# Patient Record
Sex: Male | Born: 1950 | Race: Black or African American | Hispanic: No | Marital: Single | Smoking: Current some day smoker
Health system: Southern US, Community
[De-identification: ages and names within clinical notes are randomized; demographics above are authoritative.]

## PROBLEM LIST (undated history)

## (undated) DIAGNOSIS — I509 Heart failure, unspecified: Secondary | ICD-10-CM

## (undated) DIAGNOSIS — I10 Essential (primary) hypertension: Secondary | ICD-10-CM

## (undated) DIAGNOSIS — E785 Hyperlipidemia, unspecified: Secondary | ICD-10-CM

## (undated) HISTORY — PX: OTHER SURGICAL HISTORY: SHX169

## (undated) HISTORY — PX: LEG SURGERY: SHX1003

---

## 2021-05-09 ENCOUNTER — Other Ambulatory Visit: Payer: Self-pay | Admitting: Student

## 2021-05-09 DIAGNOSIS — F17210 Nicotine dependence, cigarettes, uncomplicated: Secondary | ICD-10-CM

## 2022-02-10 ENCOUNTER — Other Ambulatory Visit: Payer: Self-pay | Admitting: Student

## 2022-02-10 DIAGNOSIS — F17200 Nicotine dependence, unspecified, uncomplicated: Secondary | ICD-10-CM

## 2022-02-10 DIAGNOSIS — F17210 Nicotine dependence, cigarettes, uncomplicated: Secondary | ICD-10-CM

## 2022-03-05 ENCOUNTER — Inpatient Hospital Stay: Admission: RE | Admit: 2022-03-05 | Payer: Self-pay | Source: Ambulatory Visit

## 2022-03-06 ENCOUNTER — Inpatient Hospital Stay (HOSPITAL_COMMUNITY)
Admission: EM | Admit: 2022-03-06 | Discharge: 2022-03-11 | DRG: 286 | Disposition: A | Discharge status: Home / Self Care | Payer: Medicare Other | Attending: Family Medicine | Admitting: Family Medicine

## 2022-03-06 ENCOUNTER — Encounter (HOSPITAL_COMMUNITY): Payer: Self-pay | Admitting: Internal Medicine

## 2022-03-06 ENCOUNTER — Emergency Department (HOSPITAL_COMMUNITY): Payer: Medicare Other

## 2022-03-06 ENCOUNTER — Other Ambulatory Visit: Payer: Self-pay

## 2022-03-06 ENCOUNTER — Inpatient Hospital Stay (HOSPITAL_COMMUNITY): Payer: Medicare Other

## 2022-03-06 DIAGNOSIS — E876 Hypokalemia: Secondary | ICD-10-CM | POA: Diagnosis present

## 2022-03-06 DIAGNOSIS — J9622 Acute and chronic respiratory failure with hypercapnia: Secondary | ICD-10-CM | POA: Diagnosis present

## 2022-03-06 DIAGNOSIS — J441 Chronic obstructive pulmonary disease with (acute) exacerbation: Secondary | ICD-10-CM | POA: Diagnosis present

## 2022-03-06 DIAGNOSIS — F17210 Nicotine dependence, cigarettes, uncomplicated: Secondary | ICD-10-CM | POA: Diagnosis present

## 2022-03-06 DIAGNOSIS — I10 Essential (primary) hypertension: Secondary | ICD-10-CM | POA: Diagnosis not present

## 2022-03-06 DIAGNOSIS — G9341 Metabolic encephalopathy: Secondary | ICD-10-CM

## 2022-03-06 DIAGNOSIS — J9621 Acute and chronic respiratory failure with hypoxia: Secondary | ICD-10-CM | POA: Diagnosis present

## 2022-03-06 DIAGNOSIS — I5042 Chronic combined systolic (congestive) and diastolic (congestive) heart failure: Secondary | ICD-10-CM

## 2022-03-06 DIAGNOSIS — Z79890 Hormone replacement therapy (postmenopausal): Secondary | ICD-10-CM | POA: Diagnosis not present

## 2022-03-06 DIAGNOSIS — R0603 Acute respiratory distress: Secondary | ICD-10-CM | POA: Diagnosis present

## 2022-03-06 DIAGNOSIS — F17200 Nicotine dependence, unspecified, uncomplicated: Secondary | ICD-10-CM

## 2022-03-06 DIAGNOSIS — I5043 Acute on chronic combined systolic (congestive) and diastolic (congestive) heart failure: Secondary | ICD-10-CM | POA: Diagnosis present

## 2022-03-06 DIAGNOSIS — I509 Heart failure, unspecified: Secondary | ICD-10-CM

## 2022-03-06 DIAGNOSIS — I130 Hypertensive heart and chronic kidney disease with heart failure and stage 1 through stage 4 chronic kidney disease, or unspecified chronic kidney disease: Secondary | ICD-10-CM | POA: Diagnosis present

## 2022-03-06 DIAGNOSIS — Z20822 Contact with and (suspected) exposure to covid-19: Secondary | ICD-10-CM | POA: Diagnosis present

## 2022-03-06 DIAGNOSIS — I4510 Unspecified right bundle-branch block: Secondary | ICD-10-CM | POA: Diagnosis present

## 2022-03-06 DIAGNOSIS — J9601 Acute respiratory failure with hypoxia: Secondary | ICD-10-CM

## 2022-03-06 DIAGNOSIS — N189 Chronic kidney disease, unspecified: Secondary | ICD-10-CM

## 2022-03-06 DIAGNOSIS — J9602 Acute respiratory failure with hypercapnia: Secondary | ICD-10-CM | POA: Diagnosis not present

## 2022-03-06 DIAGNOSIS — I160 Hypertensive urgency: Secondary | ICD-10-CM | POA: Diagnosis present

## 2022-03-06 DIAGNOSIS — Z720 Tobacco use: Secondary | ICD-10-CM

## 2022-03-06 DIAGNOSIS — I5021 Acute systolic (congestive) heart failure: Secondary | ICD-10-CM

## 2022-03-06 DIAGNOSIS — R590 Localized enlarged lymph nodes: Secondary | ICD-10-CM

## 2022-03-06 DIAGNOSIS — N1831 Chronic kidney disease, stage 3a: Secondary | ICD-10-CM | POA: Diagnosis present

## 2022-03-06 DIAGNOSIS — G9340 Encephalopathy, unspecified: Secondary | ICD-10-CM | POA: Diagnosis not present

## 2022-03-06 DIAGNOSIS — E785 Hyperlipidemia, unspecified: Secondary | ICD-10-CM | POA: Diagnosis present

## 2022-03-06 DIAGNOSIS — E1122 Type 2 diabetes mellitus with diabetic chronic kidney disease: Secondary | ICD-10-CM | POA: Diagnosis present

## 2022-03-06 DIAGNOSIS — I2510 Atherosclerotic heart disease of native coronary artery without angina pectoris: Secondary | ICD-10-CM

## 2022-03-06 DIAGNOSIS — Z79899 Other long term (current) drug therapy: Secondary | ICD-10-CM

## 2022-03-06 HISTORY — DX: Hyperlipidemia, unspecified: E78.5

## 2022-03-06 HISTORY — DX: Acute respiratory failure with hypoxia: J96.01

## 2022-03-06 LAB — ECHOCARDIOGRAM COMPLETE
Area-P 1/2: 4.04 cm2
Calc EF: 30.4 %
Height: 65 in
S' Lateral: 4.8 cm
Single Plane A2C EF: 28.9 %
Single Plane A4C EF: 37.7 %
Weight: 2496 oz

## 2022-03-06 LAB — BASIC METABOLIC PANEL
Anion gap: 11 (ref 5–15)
Anion gap: 14 (ref 5–15)
BUN: 22 mg/dL (ref 8–23)
BUN: 18 mg/dL (ref 8–23)
CO2: 29 mmol/L (ref 22–32)
CO2: 29 mmol/L (ref 22–32)
Calcium: 9.1 mg/dL (ref 8.9–10.3)
Calcium: 8.7 mg/dL — ABNORMAL LOW (ref 8.9–10.3)
Chloride: 96 mmol/L — ABNORMAL LOW (ref 98–111)
Chloride: 96 mmol/L — ABNORMAL LOW (ref 98–111)
Creatinine, Ser: 1.57 mg/dL — ABNORMAL HIGH (ref 0.61–1.24)
Creatinine, Ser: 1.67 mg/dL — ABNORMAL HIGH (ref 0.61–1.24)
GFR, Estimated: 47 mL/min — ABNORMAL LOW (ref >60)
GFR, Estimated: 43 mL/min — ABNORMAL LOW (ref >60)
Glucose, Bld: 150 mg/dL — ABNORMAL HIGH (ref 70–99)
Glucose, Bld: 231 mg/dL — ABNORMAL HIGH (ref 70–99)
Potassium: 3.9 mmol/L (ref 3.5–5.1)
Potassium: 3.7 mmol/L (ref 3.5–5.1)
Sodium: 136 mmol/L (ref 135–145)
Sodium: 139 mmol/L (ref 135–145)

## 2022-03-06 LAB — I-STAT VENOUS BLOOD GAS, ED
Acid-Base Excess: 2 mmol/L (ref 0.0–2.0)
Acid-Base Excess: 4 mmol/L — ABNORMAL HIGH (ref 0.0–2.0)
Bicarbonate: 33.3 mmol/L — ABNORMAL HIGH (ref 20.0–28.0)
Bicarbonate: 33.7 mmol/L — ABNORMAL HIGH (ref 20.0–28.0)
Calcium, Ion: 1.1 mmol/L — ABNORMAL LOW (ref 1.15–1.40)
Calcium, Ion: 1.06 mmol/L — ABNORMAL LOW (ref 1.15–1.40)
HCT: 60 % — ABNORMAL HIGH (ref 39.0–52.0)
HCT: 56 % — ABNORMAL HIGH (ref 39.0–52.0)
Hemoglobin: 20.4 g/dL — ABNORMAL HIGH (ref 13.0–17.0)
Hemoglobin: 19 g/dL — ABNORMAL HIGH (ref 13.0–17.0)
O2 Saturation: 94 %
O2 Saturation: 68 %
Potassium: 3.6 mmol/L (ref 3.5–5.1)
Potassium: 4.2 mmol/L (ref 3.5–5.1)
Sodium: 135 mmol/L (ref 135–145)
Sodium: 135 mmol/L (ref 135–145)
TCO2: 35 mmol/L — ABNORMAL HIGH (ref 22–32)
TCO2: 36 mmol/L — ABNORMAL HIGH (ref 22–32)
pCO2, Ven: 73.7 mmHg (ref 44–60)
pCO2, Ven: 70.4 mmHg (ref 44–60)
pH, Ven: 7.263 (ref 7.25–7.43)
pH, Ven: 7.289 (ref 7.25–7.43)
pO2, Ven: 87 mmHg — ABNORMAL HIGH (ref 32–45)
pO2, Ven: 41 mmHg (ref 32–45)

## 2022-03-06 LAB — RESPIRATORY PANEL BY PCR

## 2022-03-06 LAB — URINALYSIS, ROUTINE W REFLEX MICROSCOPIC
Bacteria, UA: NONE SEEN
Bilirubin Urine: NEGATIVE
Glucose, UA: NEGATIVE mg/dL
Ketones, ur: NEGATIVE mg/dL
Leukocytes,Ua: NEGATIVE
Nitrite: NEGATIVE
Protein, ur: 100 mg/dL — AB
Specific Gravity, Urine: 1.029 (ref 1.005–1.030)
pH: 5 (ref 5.0–8.0)

## 2022-03-06 LAB — I-STAT ARTERIAL BLOOD GAS, ED
Acid-Base Excess: 0 mmol/L (ref 0.0–2.0)
Bicarbonate: 29 mmol/L — ABNORMAL HIGH (ref 20.0–28.0)
Calcium, Ion: 1.19 mmol/L (ref 1.15–1.40)
HCT: 55 % — ABNORMAL HIGH (ref 39.0–52.0)
Hemoglobin: 18.7 g/dL — ABNORMAL HIGH (ref 13.0–17.0)
O2 Saturation: 91 %
Patient temperature: 98
Potassium: 3.4 mmol/L — ABNORMAL LOW (ref 3.5–5.1)
Sodium: 136 mmol/L (ref 135–145)
TCO2: 31 mmol/L (ref 22–32)
pCO2 arterial: 59.2 mmHg — ABNORMAL HIGH (ref 32–48)
pH, Arterial: 7.297 — ABNORMAL LOW (ref 7.35–7.45)
pO2, Arterial: 68 mmHg — ABNORMAL LOW (ref 83–108)

## 2022-03-06 LAB — CBC WITH DIFFERENTIAL/PLATELET
Abs Immature Granulocytes: 0.04 10*3/uL (ref 0.00–0.07)
Basophils Absolute: 0.1 10*3/uL (ref 0.0–0.1)
Basophils Relative: 1 %
Eosinophils Absolute: 0.7 10*3/uL — ABNORMAL HIGH (ref 0.0–0.5)
Eosinophils Relative: 9 %
HCT: 57.4 % — ABNORMAL HIGH (ref 39.0–52.0)
Hemoglobin: 18.6 g/dL — ABNORMAL HIGH (ref 13.0–17.0)
Immature Granulocytes: 1 %
Lymphocytes Relative: 28 %
Lymphs Abs: 2.2 10*3/uL (ref 0.7–4.0)
MCH: 30 pg (ref 26.0–34.0)
MCHC: 32.4 g/dL (ref 30.0–36.0)
MCV: 92.4 fL (ref 80.0–100.0)
Monocytes Absolute: 0.8 10*3/uL (ref 0.1–1.0)
Monocytes Relative: 10 %
Neutro Abs: 4.1 10*3/uL (ref 1.7–7.7)
Neutrophils Relative %: 51 %
Platelets: 179 10*3/uL (ref 150–400)
RBC: 6.21 MIL/uL — ABNORMAL HIGH (ref 4.22–5.81)
RDW: 14.8 % (ref 11.5–15.5)
WBC: 8 10*3/uL (ref 4.0–10.5)
nRBC: 0 % (ref 0.0–0.2)

## 2022-03-06 LAB — RESP PANEL BY RT-PCR (FLU A&B, COVID) ARPGX2
Influenza A by PCR: NEGATIVE
Influenza B by PCR: NEGATIVE
SARS Coronavirus 2 by RT PCR: NEGATIVE

## 2022-03-06 LAB — PROCALCITONIN: Procalcitonin: <0.1 ng/mL

## 2022-03-06 LAB — MRSA NEXT GEN BY PCR, NASAL: MRSA by PCR Next Gen: NOT DETECTED

## 2022-03-06 LAB — TROPONIN I (HIGH SENSITIVITY)
Troponin I (High Sensitivity): 53 ng/L — ABNORMAL HIGH (ref <18)
Troponin I (High Sensitivity): 46 ng/L — ABNORMAL HIGH (ref <18)
Troponin I (High Sensitivity): 45 ng/L — ABNORMAL HIGH (ref <18)
Troponin I (High Sensitivity): 46 ng/L — ABNORMAL HIGH (ref <18)

## 2022-03-06 LAB — GLUCOSE, CAPILLARY
Glucose-Capillary: 190 mg/dL — ABNORMAL HIGH (ref 70–99)
Glucose-Capillary: 214 mg/dL — ABNORMAL HIGH (ref 70–99)
Glucose-Capillary: 198 mg/dL — ABNORMAL HIGH (ref 70–99)

## 2022-03-06 LAB — PHOSPHORUS: Phosphorus: 3.7 mg/dL (ref 2.5–4.6)

## 2022-03-06 LAB — BRAIN NATRIURETIC PEPTIDE: B Natriuretic Peptide: 1211.4 pg/mL — ABNORMAL HIGH (ref 0.0–100.0)

## 2022-03-06 LAB — MAGNESIUM: Magnesium: 2.5 mg/dL — ABNORMAL HIGH (ref 1.7–2.4)

## 2022-03-06 MED ORDER — IOHEXOL 350 MG/ML SOLN
75.0000 mL | Freq: Once | INTRAVENOUS | Status: AC | PRN
  Administered 2022-03-06: 75 mL via INTRAVENOUS

## 2022-03-06 MED ORDER — SODIUM CHLORIDE 0.9 % IV SOLN: INTRAVENOUS | Status: DC | PRN

## 2022-03-06 MED ORDER — MAGNESIUM SULFATE 2 GM/50ML IV SOLN
2.0000 g | Freq: Once | INTRAVENOUS | Status: AC
  Administered 2022-03-06: 2 g via INTRAVENOUS
  Filled 2022-03-06: qty 50

## 2022-03-06 MED ORDER — NITROGLYCERIN 2 % TD OINT
1.0000 [in_us] | TOPICAL_OINTMENT | Freq: Four times a day (QID) | TRANSDERMAL | Status: DC
  Administered 2022-03-06 – 2022-03-07 (×2): 1 [in_us] via TOPICAL
  Filled 2022-03-06 (×2): qty 30

## 2022-03-06 MED ORDER — METHYLPREDNISOLONE SODIUM SUCC 40 MG IJ SOLR
40.0000 mg | Freq: Two times a day (BID) | INTRAMUSCULAR | Status: DC
  Administered 2022-03-06: 40 mg via INTRAVENOUS
  Filled 2022-03-06 (×2): qty 1

## 2022-03-06 MED ORDER — CHLORHEXIDINE GLUCONATE CLOTH 2 % EX PADS
6.0000 | MEDICATED_PAD | Freq: Every day | CUTANEOUS | Status: DC
  Administered 2022-03-06 – 2022-03-07 (×2): 6 via TOPICAL

## 2022-03-06 MED ORDER — DOCUSATE SODIUM 100 MG PO CAPS: 100.0000 mg | ORAL_CAPSULE | Freq: Two times a day (BID) | ORAL | Status: DC | PRN

## 2022-03-06 MED ORDER — NITROGLYCERIN 2 % TD OINT
1.0000 [in_us] | TOPICAL_OINTMENT | Freq: Once | TRANSDERMAL | Status: AC
  Administered 2022-03-06: 1 [in_us] via TOPICAL
  Filled 2022-03-06: qty 1

## 2022-03-06 MED ORDER — ACETAZOLAMIDE SODIUM 500 MG IJ SOLR
500.0000 mg | Freq: Once | INTRAMUSCULAR | Status: AC
  Administered 2022-03-06: 500 mg via INTRAVENOUS
  Filled 2022-03-06: qty 500

## 2022-03-06 MED ORDER — IPRATROPIUM BROMIDE 0.02 % IN SOLN
0.5000 mg | Freq: Once | RESPIRATORY_TRACT | Status: AC
Start: 2022-03-06 — End: 2022-03-06
  Administered 2022-03-06: 0.5 mg via RESPIRATORY_TRACT
  Filled 2022-03-06: qty 2.5

## 2022-03-06 MED ORDER — ASPIRIN 81 MG PO CHEW
81.0000 mg | CHEWABLE_TABLET | Freq: Every day | ORAL | Status: DC
  Administered 2022-03-07 – 2022-03-11 (×5): 81 mg via ORAL
  Filled 2022-03-06 (×5): qty 1

## 2022-03-06 MED ORDER — ASPIRIN 325 MG PO TABS
325.0000 mg | ORAL_TABLET | Freq: Once | ORAL | Status: AC
  Administered 2022-03-06: 325 mg via ORAL
  Filled 2022-03-06: qty 1

## 2022-03-06 MED ORDER — POLYETHYLENE GLYCOL 3350 17 G PO PACK
17.0000 g | PACK | Freq: Every day | ORAL | Status: DC | PRN
  Administered 2022-03-08: 17 g via ORAL
  Filled 2022-03-06: qty 1

## 2022-03-06 MED ORDER — CHLORHEXIDINE GLUCONATE 0.12 % MT SOLN
15.0000 mL | Freq: Two times a day (BID) | OROMUCOSAL | Status: DC
  Administered 2022-03-06 – 2022-03-11 (×8): 15 mL via OROMUCOSAL
  Filled 2022-03-06 (×7): qty 15

## 2022-03-06 MED ORDER — HEPARIN (PORCINE) 25000 UT/250ML-% IV SOLN
950.0000 [IU]/h | INTRAVENOUS | Status: AC
  Administered 2022-03-06: 950 [IU]/h via INTRAVENOUS
  Filled 2022-03-06 (×2): qty 250

## 2022-03-06 MED ORDER — NITROGLYCERIN 0.4 MG SL SUBL
0.4000 mg | SUBLINGUAL_TABLET | SUBLINGUAL | Status: DC | PRN
  Administered 2022-03-06: 0.4 mg via SUBLINGUAL
  Filled 2022-03-06: qty 1

## 2022-03-06 MED ORDER — METHYLPREDNISOLONE SODIUM SUCC 125 MG IJ SOLR
125.0000 mg | Freq: Once | INTRAMUSCULAR | Status: AC
  Administered 2022-03-06: 125 mg via INTRAVENOUS
  Filled 2022-03-06: qty 2

## 2022-03-06 MED ORDER — ORAL CARE MOUTH RINSE
15.0000 mL | Freq: Two times a day (BID) | OROMUCOSAL | Status: DC
  Administered 2022-03-06 – 2022-03-08 (×3): 15 mL via OROMUCOSAL

## 2022-03-06 MED ORDER — ALBUTEROL SULFATE (2.5 MG/3ML) 0.083% IN NEBU
5.0000 mg | INHALATION_SOLUTION | Freq: Once | RESPIRATORY_TRACT | Status: AC
  Administered 2022-03-06: 5 mg via RESPIRATORY_TRACT

## 2022-03-06 MED ORDER — HEPARIN SODIUM (PORCINE) 5000 UNIT/ML IJ SOLN
5000.0000 [IU] | Freq: Three times a day (TID) | INTRAMUSCULAR | Status: DC
  Administered 2022-03-06 (×2): 5000 [IU] via SUBCUTANEOUS
  Filled 2022-03-06 (×2): qty 1

## 2022-03-06 MED ORDER — HYDRALAZINE HCL 20 MG/ML IJ SOLN
10.0000 mg | INTRAMUSCULAR | Status: DC | PRN
  Administered 2022-03-06: 10 mg via INTRAVENOUS
  Filled 2022-03-06: qty 1

## 2022-03-06 MED ORDER — ALBUTEROL SULFATE (2.5 MG/3ML) 0.083% IN NEBU
5.0000 mg | INHALATION_SOLUTION | Freq: Once | RESPIRATORY_TRACT | Status: AC
  Administered 2022-03-06: 5 mg via RESPIRATORY_TRACT
  Filled 2022-03-06: qty 6

## 2022-03-06 MED ORDER — ARFORMOTEROL TARTRATE 15 MCG/2ML IN NEBU
15.0000 ug | INHALATION_SOLUTION | Freq: Two times a day (BID) | RESPIRATORY_TRACT | Status: DC
  Administered 2022-03-06 – 2022-03-09 (×6): 15 ug via RESPIRATORY_TRACT
  Filled 2022-03-06 (×7): qty 2

## 2022-03-06 MED ORDER — PANTOPRAZOLE SODIUM 40 MG IV SOLR
40.0000 mg | Freq: Every day | INTRAVENOUS | Status: DC
  Administered 2022-03-06 – 2022-03-08 (×3): 40 mg via INTRAVENOUS
  Filled 2022-03-06 (×3): qty 10

## 2022-03-06 MED ORDER — ALBUTEROL (5 MG/ML) CONTINUOUS INHALATION SOLN: 5.0000 mg/h | INHALATION_SOLUTION | Freq: Once | RESPIRATORY_TRACT | Status: DC

## 2022-03-06 MED ORDER — POTASSIUM CHLORIDE CRYS ER 10 MEQ PO TBCR
30.0000 meq | EXTENDED_RELEASE_TABLET | Freq: Once | ORAL | Status: AC
  Administered 2022-03-07: 30 meq via ORAL
  Filled 2022-03-06: qty 1

## 2022-03-06 MED ORDER — BUDESONIDE 0.5 MG/2ML IN SUSP
0.5000 mg | Freq: Two times a day (BID) | RESPIRATORY_TRACT | Status: DC
  Administered 2022-03-06 – 2022-03-09 (×6): 0.5 mg via RESPIRATORY_TRACT
  Filled 2022-03-06 (×6): qty 2

## 2022-03-06 MED ORDER — ONDANSETRON HCL 4 MG/2ML IJ SOLN: 4.0000 mg | Freq: Four times a day (QID) | INTRAMUSCULAR | Status: DC | PRN

## 2022-03-06 MED ORDER — FUROSEMIDE 10 MG/ML IJ SOLN
40.0000 mg | Freq: Once | INTRAMUSCULAR | Status: AC
  Administered 2022-03-06: 40 mg via INTRAVENOUS
  Filled 2022-03-06: qty 4

## 2022-03-06 MED ORDER — METHYLPREDNISOLONE SODIUM SUCC 125 MG IJ SOLR: 125.0000 mg | Freq: Once | INTRAMUSCULAR | Status: DC

## 2022-03-06 MED ORDER — STERILE WATER FOR INJECTION IJ SOLN
INTRAMUSCULAR | Status: AC
  Filled 2022-03-06: qty 10

## 2022-03-06 NOTE — Progress Notes (Signed)
Pt transported from ED22 to CT and back with no complications.

## 2022-03-06 NOTE — Progress Notes (Signed)
ANTICOAGULATION CONSULT NOTE - Initial Consult  Pharmacy Consult for IV heparin Indication: chest pain/ACS  No Known Allergies  Patient Measurements: Height: 5\' 5"  (165.1 cm) Weight: 70.8 kg (156 lb) IBW/kg (Calculated) : 61.5 Heparin Dosing Weight: 70.8 kg  Vital Signs: Temp: 98.5 F (36.9 C) (05/19 1525) Temp Source: Oral (05/19 1525) BP: 127/77 (05/19 1700) Pulse Rate: 98 (05/19 1700)  Labs: Recent Labs    03/06/22 0503 03/06/22 0543 03/06/22 0737 03/06/22 0739 03/06/22 0832  HGB 18.6* 20.4*  --  19.0* 18.7*  HCT 57.4* 60.0*  --  56.0* 55.0*  PLT 179  --   --   --   --   CREATININE 1.57*  --   --   --   --   TROPONINIHS 53*  --  46*  --   --     Estimated Creatinine Clearance: 37.5 mL/min (A) (by C-G formula based on SCr of 1.57 mg/dL (H)).   Medical History: No past medical history on file.  Medications:  Infusions:   Assessment: 71 yo male admitted with SOB, placed on bipap.  Now with t-wave changes on EKG and pharmacy asked to start anticoagulation with IV heparin.  No anticoagulation noted PTA.  Last sq heparin dose at 1545 pm.  Goal of Therapy:  Heparin level 0.3-0.7 units/ml Monitor platelets by anticoagulation protocol: Yes   Plan:  Start IV heparin gtt at 950 units/hr without bolus since pt recently had 5000 units SQ heparin. Check heparin level 8 hrs after gtt starts. Daily heparin level and CBC. F/u plans for further cardiac workup.  62, Reece Leader, BCCP Clinical Pharmacist  03/06/2022 5:38 PM   [facility] pharmacy phone numbers are listed on [email or web address]

## 2022-03-06 NOTE — TOC Initial Note (Addendum)
Transition of Care [facility]) - Initial/Assessment Note    Patient Details  Name: Marcus Tran MRN: [id number] Date of Birth: [birth year]  Transition of Care [facility]) CM/SW Contact:    Angelita Ingles, RN Phone [phone]  03/06/2022, 1:41 PM  Clinical Narrative:                  Transition of Care [facility]) Screening Note   Patient Details  Name: Marcus Tran Date of Birth: 1951/09/01   Transition of Care [facility]) CM/SW Contact:    Angelita Ingles, RN Phone Number: 03/06/2022, 1:42 PM    Transition of Care Department [facility]) has reviewed patient and no TOC needs have been identified at this time. We will continue to monitor patient advancement through interdisciplinary progression rounds. TOC acknowledges that it is early in the admission process and that needs may arise. We will follow closely for any needs.   Patient presented with increasing shortness of breath, and  found to be hypoxic in the ED on nonrebreather mask. Currently on continuous bipap.         Patient Goals and CMS Choice        Expected Discharge Plan and Services                                                Prior Living Arrangements/Services                       Activities of Daily Living      Permission Sought/Granted                  Emotional Assessment              Admission diagnosis:  Acute respiratory distress [R06.03] COPD exacerbation ([place]) [J44.1] Acute respiratory failure with hypoxia and hypercapnia (H[facility]) [J96.01, J96.02] Hypertension, unspecified type [I10] Patient Active Problem List   Diagnosis Date Noted   Acute respiratory failure with hypoxia and hypercapnia ([place]) 03/06/2022   PCP:  Cipriano Mile, NP Pharmacy:   [facility] [place] [place] [address] Phone: [phone] Fax: [phone]     Social Determinants of Health (SDOH) Interventions    Readmission Risk  Interventions     View : No data to display.

## 2022-03-06 NOTE — H&P (Signed)
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 EMS was called and found pt hypoxic SpO2 60s -- was given nebs x4, 2 g mag, 125mg  solumedrol. Placed on NRB In ED pt was placed on BiPAP. VBG on BiPAP 7.26 / 73.7/ 87 -- with subsequent ABG 7.297 / 59/68 and FiO2 incr to 100%  IMTS initially called for admission however with lack of significant improvement in clinical picture, and increasing O2 req, PCCM consulted for admission.   Prior to PCCM arrival, IMTS has ordered steroids and nebs.   Labs at time of consultation most significant for BNP 1200 hs trop 54  CTA neg for PE but is suggestive of fluid overload   Pertinent  Medical History  Tobacco use Possible obstructive lung disease   Significant Hospital Events: Including procedures, antibiotic start and stop dates in addition to other pertinent events   5/19 arrives to ED SOB and hypoxia -- BiPAP for hypoxic/hypercarbic resp failure. Lack of improvement --> PCCM consult   Interim History / Subjective:  CTA neg Incr FiO2 to 100% for PaO2 60s  More lethargic   Objective   Blood pressure (!) 183/118, pulse 92, temperature 98 F (36.7 C), temperature source Oral, resp. rate 18, height 5\' 5"  (1.651 m), weight 70.8 kg, SpO2 100 %.    FiO2 (%):  [50 %-100 %] 100 %  No intake or output data in the 24 hours ending 03/06/22 0856 Filed Weights   03/06/22 0500  Weight: 70.8 kg    Examination: General: Acutely ill appearing older adult M reclined in stretcher on BiPAP in NAD  HENT: NCAT pink mm anicteric sclera. BiPAP in place with good seal.  Lungs: Poor air  movement.  End expiratory wheeze.  Cardiovascular: Distant heart sounds. S1s2 cap refill < 3 sec  Abdomen: soft ndnt + bowel sounds  Extremities: no acute joint deformity. No cyanosis no clubbing  Neuro: Lethargic. Awakens to voice. Following commands intermittently.  GU: defer   Resolved Hospital Problem list     Assessment & Plan:   Acute encephalopathy due to hypoxia, hypercarbia  P -resp plan as below -delirium prevention -minimize CNS depressing meds   Acute respiratory failure with hypoxia and hypercarbia Suspected underlying COPD / emphysema  Hx tobacco abuse  Incidental finding: bilateral hilar lymph nodes 1.2cm on R 1.1 cm on L, subcarinal and precarinal mediastinal nodes, 82mm posterior basal RLL nodule, scattered small subpleural nodules  -unknown lung hx -- I see historically Rx BDS which would suggest some degree of underlying dz though pt denies dx and ongoing inhaler use -CTA neg for PE. Does suggest vascular overload -- query cardiac etiology of resp failure vs acute exacerbation of underlying lung dz, bronchospasm  -COVID, flu a/b neg P -admit to ICU  -diurese -- lasix + diamox -steroids  -PPI while on steroids  -Cont BiPAP -NPO on BiPAP -send RVP -monitor for possible ETT  -will need follow up CT in 58mo and rec outpt follow up   Suspected acute heart failure, with right heart strain -RV/LV > 1 on CTA, neg for PE. Mild cardiomegaly  -  BNP 1200, trop mild elevation about 50 -- demand P -as above, diurese  -ECHO   HTN P -will see how responds to diuretic and escalate antihypertensive reg as appropriate   CAD -on CTA scattered 3v coronary artery calcification  -will benefit from outpt cards   AKI vs CKD -no prior Cr in EMR -- admitting Cr with 1.57 P -trend UOP   Hypokalemia Hypocalcemia P -optimize lytes   Best Practice (right click and "Reselect all SmartList Selections" daily)   Diet/type: NPO DVT prophylaxis: prophylactic heparin  GI  prophylaxis: PPI Lines: N/A Foley:  N/A Code Status:  full code Last date of multidisciplinary goals of care discussion [pending]  Labs   CBC: Recent Labs  Lab 03/06/22 0503 03/06/22 0543 03/06/22 0739 03/06/22 0832  WBC 8.0  --   --   --   NEUTROABS 4.1  --   --   --   HGB 18.6* 20.4* 19.0* 18.7*  HCT 57.4* 60.0* 56.0* 55.0*  MCV 92.4  --   --   --   PLT 179  --   --   --     Basic Metabolic Panel: Recent Labs  Lab 03/06/22 0503 03/06/22 0543 03/06/22 0739 03/06/22 0832  NA 136 135 135 136  K 3.9 3.6 4.2 3.4*  CL 96*  --   --   --   CO2 29  --   --   --   GLUCOSE 150*  --   --   --   BUN 22  --   --   --   CREATININE 1.57*  --   --   --   CALCIUM 9.1  --   --   --    GFR: Estimated Creatinine Clearance: 37.5 mL/min (A) (by C-G formula based on SCr of 1.57 mg/dL (H)). Recent Labs  Lab 03/06/22 0503  WBC 8.0    Liver Function Tests: No results for input(s): AST, ALT, ALKPHOS, BILITOT, PROT, ALBUMIN in the last 168 hours. No results for input(s): LIPASE, AMYLASE in the last 168 hours. No results for input(s): AMMONIA in the last 168 hours.  ABG    Component Value Date/Time   PHART 7.297 (L) 03/06/2022 0832   PCO2ART 59.2 (H) 03/06/2022 0832   PO2ART 68 (L) 03/06/2022 0832   HCO3 29.0 (H) 03/06/2022 0832   TCO2 31 03/06/2022 0832   O2SAT 91 03/06/2022 0832     Coagulation Profile: No results for input(s): INR, PROTIME in the last 168 hours.  Cardiac Enzymes: No results for input(s): CKTOTAL, CKMB, CKMBINDEX, TROPONINI in the last 168 hours.  HbA1C: No results found for: HGBA1C  CBG: No results for input(s): GLUCAP in the last 168 hours.  Review of Systems:   Unable to obtain due to encephalopathy   Past Medical History:  He,  has no past medical history on file.   Surgical History:  No surgical history  Social History:    Tobacco use   Family History:  His family history is not on file.   Allergies No Known Allergies   Home  Medications  Prior to Admission medications   Medication Sig Start Date End Date Taking? Authorizing Provider  albuterol (VENTOLIN HFA) 108 (90 Base) MCG/ACT inhaler Inhale 1-2 puffs into the lungs every 4 (four) hours as needed for wheezing or shortness of breath. 11/19/21   [provider]  atorvastatin (LIPITOR) 40 MG tablet Take 40 mg by mouth at bedtime. 11/19/21   [provider]  BREZTRI AEROSPHERE 160-9-4.8 MCG/ACT AERO Take 2 puffs by mouth 2 (two) times daily. 12/23/21   [provider]  ipratropium-albuterol (DUONEB) 0.5-2.5 (3) MG/3ML SOLN Take by nebulization. 02/10/22   [provider]  SYMBICORT 160-4.5 MCG/ACT inhaler Inhale 2 puffs into the lungs in the morning and at bedtime. 11/21/21   [provider]     Critical care time: 55 minutes     CRITICAL CARE Performed by: Lanier Clam   Total critical care time: 55 minutes  Critical care time was exclusive of separately billable procedures and treating other patients.  Critical care was necessary to treat or prevent imminent or life-threatening deterioration.  Critical care was time spent personally by me on the following activities: development of treatment plan with patient and/or surrogate as well as nursing, discussions with consultants, evaluation of patient's response to treatment, examination of patient, obtaining history from patient or surrogate, ordering and performing treatments and interventions, ordering and review of laboratory studies, ordering and review of radiographic studies, pulse oximetry and re-evaluation of patient's condition.  Tessie Fass MSN, AGACNP-BC [facility] Pulmonary/Critical Care Medicine Amion for pager  03/06/2022, 9:49 AM

## 2022-03-06 NOTE — ED Provider Notes (Signed)
[facility] EMERGENCY DEPARTMENT Provider Note   CSN: [id number] Arrival date & time: 03/06/22  0449     History  Chief Complaint  Patient presents with   Shortness of Breath    Marcus Tran is a 71 y.o. male.  HPI  Patient not seen with medical history presents with complaints of shortness of breath, started today.  Unable to get very much information patient is having significant difficulty breathing.  Former smoker, no history of COPD, no history of cardiac abnormalities, no endorsing any other complaints other than shortness of breath.  EMS found patient severely hypoxic 67% while on room air, start him on nonrebreather, provide him with 2 g of magnesium, 125 Solu-Medrol as well as DuoNeb.  Reviewed patient's chart has not been within our system in the past no records to compare to. Home Medications Prior to Admission medications   Not on File      Allergies    Patient has no known allergies.    Review of Systems   Review of Systems  Unable to perform ROS: Severe respiratory distress   Physical Exam Updated Vital Signs BP (!) 189/133   Pulse (!) 102   Temp 98 F (36.7 C) (Oral)   Resp 20   Ht 5\' 5"  (1.651 m)   Wt 70.8 kg   SpO2 100%   BMI 25.96 kg/m  Physical Exam Vitals and nursing note reviewed.  Constitutional:      General: He is in acute distress.     Appearance: He is ill-appearing.  HENT:     Head: Normocephalic and atraumatic.     Nose: No congestion.     Mouth/Throat:     Mouth: Mucous membranes are dry.     Pharynx: Oropharynx is clear.  Eyes:     Conjunctiva/sclera: Conjunctivae normal.  Cardiovascular:     Rate and Rhythm: Regular rhythm. Tachycardia present.     Pulses: Normal pulses.     Heart sounds: No murmur heard.   No friction rub. No gallop.  Pulmonary:     Effort: Respiratory distress present.     Breath sounds: Wheezing present. No rhonchi or rales.     Comments: Significant respiratory distress upon my  evaluation, tachypneic hypoxic unable to speak in full sentences assessor muscle usage patient had a tight sounding chest with wheezing heard throughout the upper and lower lobes bilaterally, no rales or rhonchi present. Abdominal:     Palpations: Abdomen is soft.     Tenderness: There is no abdominal tenderness. There is no right CVA tenderness or left CVA tenderness.  Musculoskeletal:     Right lower leg: No edema.     Left lower leg: No edema.  Skin:    General: Skin is warm and dry.  Neurological:     Mental Status: He is alert.  Psychiatric:        Mood and Affect: Mood normal.    ED Results / Procedures / Treatments   Labs (all labs ordered are listed, but only abnormal results are displayed) Labs Reviewed  BASIC METABOLIC PANEL - Abnormal; Notable for the following components:      Result Value   Chloride 96 (*)    Glucose, Bld 150 (*)    Creatinine, Ser 1.57 (*)    GFR, Estimated 47 (*)    All other components within normal limits  CBC WITH DIFFERENTIAL/PLATELET - Abnormal; Notable for the following components:   RBC 6.21 (*)    Hemoglobin  18.6 (*)    HCT 57.4 (*)    Eosinophils Absolute 0.7 (*)    All other components within normal limits  BRAIN NATRIURETIC PEPTIDE - Abnormal; Notable for the following components:   B Natriuretic Peptide 1,211.4 (*)    All other components within normal limits  I-STAT VENOUS BLOOD GAS, ED - Abnormal; Notable for the following components:   pCO2, Ven 73.7 (*)    pO2, Ven 87 (*)    Bicarbonate 33.3 (*)    TCO2 35 (*)    Calcium, Ion 1.10 (*)    HCT 60.0 (*)    Hemoglobin 20.4 (*)    All other components within normal limits  TROPONIN I (HIGH SENSITIVITY) - Abnormal; Notable for the following components:   Troponin I (High Sensitivity) 53 (*)    All other components within normal limits  RESP PANEL BY RT-PCR (FLU A&B, COVID) ARPGX2  BLOOD GAS, VENOUS  TROPONIN I (HIGH SENSITIVITY)    EKG EKG  Interpretation  Date/Time:  Friday Mar 06 2022 05:05:27 EDT Ventricular Rate:  113 PR Interval:  135 QRS Duration: 135 QT Interval:  382 QTC Calculation: 524 R Axis:   -35 Text Interpretation: Sinus tachycardia Biatrial enlargement Right bundle branch block LVH with secondary repolarization abnormality Anterior Q waves, possibly due to LVH ischemia laterally Confirmed by Randal Buba, April ([id number]) on 03/06/2022 5:15:56 AM  Radiology DG Chest Port 1 View  Result Date: 03/06/2022 CLINICAL DATA:  71 year old male with history of severe shortness of breath. EXAM: PORTABLE CHEST 1 VIEW COMPARISON:  No priors. FINDINGS: Lung volumes are normal. No consolidative airspace disease. No pleural effusions. Interstitial prominence is noted throughout the mid to lower lungs bilaterally where there is some mild peribronchial cuffing. No pneumothorax. No evidence of pulmonary edema. However, there is severe fullness of the hilar regions bilaterally, the appearance of which is favored to reflect dilated central pulmonary arteries. Heart size is mildly enlarged. Upper mediastinal contours are within normal limits. IMPRESSION: 1. Abnormal chest x-ray, without prior studies available for comparison. Follow-up chest CT should be considered to better evaluate the above findings. This could be performed as a high-resolution chest CT if there is clinical concern for interstitial lung disease, or could be performed as a PE protocol CT scan if there is any clinical concern for pulmonary embolism. Electronically Signed   By: Vinnie Langton M.D.   On: 03/06/2022 05:47    Procedures .Critical Care Performed by: Marcello Fennel, PA-C Authorized by: Marcello Fennel, PA-C   Critical care provider statement:    Critical care time (minutes):  30   Critical care was necessary to treat or prevent imminent or life-threatening deterioration of the following conditions:  Respiratory failure   Critical care was time spent  personally by me on the following activities:  Evaluation of patient's response to treatment, examination of patient, ordering and review of laboratory studies, ordering and review of radiographic studies, ordering and performing treatments and interventions, pulse oximetry, re-evaluation of patient's condition and review of old charts   I assumed direction of critical care for this patient from another provider in my specialty: no      Medications Ordered in ED Medications  nitroGLYCERIN (NITROGLYN) 2 % ointment 1 inch (1 inch Topical Given 03/06/22 0558)  albuterol (PROVENTIL) (2.5 MG/3ML) 0.083% nebulizer solution 5 mg (5 mg Nebulization Given 03/06/22 0524)  albuterol (PROVENTIL) (2.5 MG/3ML) 0.083% nebulizer solution 5 mg (5 mg Nebulization Given 03/06/22 0704)  ipratropium (ATROVENT) nebulizer solution  0.5 mg (0.5 mg Nebulization Given 03/06/22 0705)  iohexol (OMNIPAQUE) 350 MG/ML injection 75 mL (75 mLs Intravenous Contrast Given 03/06/22 [id number])    ED Course/ Medical Decision Making/ A&P                           Medical Decision Making Amount and/or Complexity of Data Reviewed Labs: ordered. Radiology: ordered.  Risk Prescription drug management.   This patient presents to the ED for concern of shortness of breath, this involves an extensive number of treatment options, and is a complaint that carries with it a high risk of complications and morbidity.  The differential diagnosis includes ACS, PE, CHF exacerbation, pneumonia    Additional history obtained:  Additional history obtained from EMS External records from outside source obtained and reviewed including N/A   Co morbidities that complicate the patient evaluation  Former smoker  Social Determinants of Health:  N/A    Lab Tests:  I Ordered, and personally interpreted labs.  The pertinent results include: CBC s unremarkable, BMP shows glucose of 150 creatinine 1.57 GFR 47, first troponin was 53, BNP is 1211, VBG  shows PCO2 of 73 PO2 of 87, bicarb 33 respiratory panel negative   Imaging Studies ordered:  I ordered imaging studies including chest x-ray, CT of chest I independently visualized and interpreted imaging which showed chest x-ray slightly hyperinflated. I agree with the radiologist interpretation   Cardiac Monitoring:  The patient was maintained on a cardiac monitor.  I personally viewed and interpreted the cardiac monitored which showed an underlying rhythm of: Sinus tach without ST elevation.   Medicines ordered and prescription drug management:  I ordered medication including continuous neb, nitro  I have reviewed the patients home medicines and have made adjustments as needed  Critical Interventions:  Respiratory distress placed on BiPAP   Reevaluation:  Presents with acute respiratory failure, wheezing heard bilaterally, concern for possible COPD exacerbation versus CHF will place patient BiPAP, start continuous neb, also provide nitro as he is severely hypertensive.  Was reassessed he is resting more comfortably, vital signs are improving, VBG is consistent with CO2 retaining checks x-ray is unclear  will proceed with CT of chest to rule out PE at this time.      Consultations Obtained:  Hospitalist consultation pending    Test Considered:  N/A    Rule out I have low suspicion for ACS as presentation atypical, there is no ST elevation presents, for troponin is only slightly elevated at 53 suspect this is more demand ischemia from increased respiratory demand second opponent is pending.  I have this patient for dissection presentation atypical more consistent with likely COPD exacerbation.  Low suspicion for CHF exacerbation does not appear volume overloaded my exam, no rales present, no JVD, does have an elevated BNP but I suspect this is more related to elevated BP as well as increased heart rate.  My suspicion for pneumonia is lower at this time as there is no  evidence of infection seen on chest x-ray, afebrile, no leukocytosis.  CT scan pending.    Dispostion and problem list  Due to shift change patient be handed off to [employer] PA-C  Follow-up on CT of chest, will need admission for acute respiratory failure            Final Clinical Impression(s) / ED Diagnoses Final diagnoses:  Acute respiratory distress    Rx / DC Orders ED Discharge Orders  None         Marcello Fennel, PA-C 03/06/22 [id number]    Maudie Flakes, MD 03/13/22 2256

## 2022-03-06 NOTE — Progress Notes (Signed)
CCM interval progress note  Pt admitted earlier today with hypoxic and hypercarbic resp failure requiring bipap, which has since been improving. Initial ECG with anterior q waves. Repeat ECG with t wave abnormality, trops assuring 53 > 46. ECHO ordered  This afternoon Patient newly reporting L sided chest pain, not radiating. Feels like a muscle cramp. Not radiating. Not crushing. No chest tightness.  ECG with  t wave abnormality c/f lateral ischemia ECHO not yet read P -starting hep gtt per pharm dosing  -repeat stat trops -asa, will give SL nitro  -page out to cardiology for consultation  Discussed with ccm Md  Eliseo Gum MSN, AGACNP-BC [place] for pager 03/06/2022, 5:55 PM

## 2022-03-06 NOTE — Consult Note (Signed)
Cardiology Consultation:   Patient ID: Marcus Tran; [id number]; August 09, 1951   Admit date: 03/06/2022 Date of Consult: 03/06/2022  Primary Care Provider: Cipriano Mile, NP Primary Cardiologist: None  Primary Electrophysiologist:  None   Patient Profile:   Marcus Tran is a 71 y.o. male with no past cardiac history who is being seen today for the evaluation of cardiomyopathy at the request of Dr. Tacy Learn.  History of Present Illness:   Mr. Cavazos presented via EMS with progressive respiratory distress.  He was found by EMS to be hypoxemic.  He was treated with Solu-Medrol nebulizer.  He was given magnesium.  Heart rate was 102.  He was hypertensive in the emergency room reported at 189/133.  BNP was elevated 1211.  Creatinine was mildly elevated 1.57.   He was managed with nitroglycerin paste.  He was given hydralazine and Lasix.    Apparently reported left-sided chest pain in the emergency room but he denies this..    He had a right bundle branch block  no acute ST segment elevation.  There were no old EKGs for comparison he does have a bundle branch block diffuse T wave inversions without old EKGs for comparison.  Troponins are mildly elevated but flat and trending downward.    He initially was managed with BiPAP but was able to be weaned 4 L via nasal cannula.    He was treated with heparin.  He was treated with aspirin.   An echocardiogram was obtained with results as below.  He denies any chest pain.  He actually says he can be active in the yard and he denies any shortness of breath.  He denies PND or orthopnea.  He said no weight gain.  He said no palpitations, presyncope or syncope.  He actually says he came because he had abdominal cramping and does not really recall the respiratory distress.  He does not report any prior lung disease or cardiac history or cardiac work-up.  Past Medical History:  Diagnosis Date   Dyslipidemia     Past Surgical History:  Procedure Laterality Date    LEG SURGERY     Right-sided as result of trauma   Umbilicus surgery     As a child     Home Medications:  Prior to Admission medications   Medication Sig Start Date End Date Taking? Authorizing Provider  albuterol (VENTOLIN HFA) 108 (90 Base) MCG/ACT inhaler Inhale 1-2 puffs into the lungs every 4 (four) hours as needed for wheezing or shortness of breath. 11/19/21  Yes [provider]  atorvastatin (LIPITOR) 40 MG tablet Take 40 mg by mouth at bedtime. 11/19/21  Yes [provider]  BREZTRI AEROSPHERE 160-9-4.8 MCG/ACT AERO Take 2 puffs by mouth 2 (two) times daily. 12/23/21  Yes [provider]  furosemide (LASIX) 20 MG tablet Take 20 mg by mouth daily.    [provider]  ipratropium-albuterol (DUONEB) 0.5-2.5 (3) MG/3ML SOLN Take 3 mLs by nebulization every 6 (six) hours as needed (shortness of breath). 02/10/22   [provider]  potassium chloride (KLOR-CON) 10 MEQ tablet Take 10 mEq by mouth daily.    [provider]  SYMBICORT 160-4.5 MCG/ACT inhaler Inhale 2 puffs into the lungs in the morning and at bedtime. Patient not taking: Reported on 03/06/2022 11/21/21   [provider]    Inpatient Medications: Scheduled Meds:  arformoterol  15 mcg Nebulization BID   budesonide (PULMICORT) nebulizer solution  0.5 mg Nebulization BID   chlorhexidine  15 mL Mouth Rinse BID   Chlorhexidine Gluconate Cloth  6 each Topical Q0600   mouth rinse  15 mL Mouth Rinse q12n4p   methylPREDNISolone (SOLU-MEDROL) injection  40 mg Intravenous Q12H   pantoprazole (PROTONIX) IV  40 mg Intravenous QHS   Continuous Infusions:  heparin 950 Units/hr (03/06/22 1750)   magnesium sulfate bolus IVPB 2 g (03/06/22 1809)   PRN Meds: docusate sodium, hydrALAZINE, nitroGLYCERIN, ondansetron (ZOFRAN) IV, polyethylene glycol  Allergies:   No Known Allergies  Social History:   Social History   Socioeconomic History   Marital status: Single    Spouse  name: Not on file   Number of children: Not on file   Years of education: Not on file   Highest education level: Not on file  Occupational History   Not on file  Tobacco Use   Smoking status: Not on file   Smokeless tobacco: Not on file  Substance and Sexual Activity   Alcohol use: Not on file   Drug use: Not on file   Sexual activity: Not on file  Other Topics Concern   Not on file  Social History Narrative   He lives with his daughter apparently and 5 grandchildren.  He reports that he smokes probably less than a pack of cigarettes a day.  He occasionally drinks a beer or liquor but not daily.   Social Determinants of Health   Financial Resource Strain: Not on file  Food Insecurity: Not on file  Transportation Needs: Not on file  Physical Activity: Not on file  Stress: Not on file  Social Connections: Not on file  Intimate Partner Violence: Not on file   Family History:   History reviewed. No pertinent family history.   He thinks both his parents died of old age.  ROS:  Please see the history of present illness.  Occasional diarrhea. All other ROS reviewed and negative.     Physical Exam/Data:   Vitals:   03/06/22 1600 03/06/22 1612 03/06/22 1700 03/06/22 1800  BP:  134/84 127/77 (!) 140/95  Pulse: (!) 111  98 98  Resp: (!) 21  17 (!) 24  Temp:      TempSrc:      SpO2:   94% 94%  Weight:      Height:        Intake/Output Summary (Last 24 hours) at 03/06/2022 1856 Last data filed at 03/06/2022 1600 Gross per 24 hour  Intake 240 ml  Output 1250 ml  Net -1010 ml   Filed Weights   03/06/22 0500  Weight: 70.8 kg   Body mass index is 25.96 kg/m.  GENERAL:  Wellappearing HEENT:   Pupils equal round and reactive, fundi not visualized, oral mucosa unremarkable NECK:  No  jugular venous distention, waveform within normal limits, carotid upstroke brisk and symmetric, no bruits, no thyromegaly LYMPHATICS:  No cervical, inguinal adenopathy LUNGS: Decreased breath  sounds bilaterally with extensive wheezing but no crackles BACK:  No CVA tenderness CHEST:   Unremarkable HEART:  PMI not displaced or sustained,S1 and S2 within normal limits, no S3, no S4, no clicks, no rubs, no murmurs ABD:  Flat, positive bowel sounds normal in frequency in pitch, no bruits, no rebound, no guarding, no midline pulsatile mass, no hepatomegaly, no splenomegaly EXT:  2 plus pulses throughout, no  edema, no cyanosis no clubbing SKIN:  No rashes no nodules NEURO:   Cranial nerves II through XII grossly intact, motor grossly intact throughout PSYCH:  Cognitively intact, oriented to person place and time   EKG:  The EKG was personally reviewed and demonstrates: Right bundle branch block, rate 113, left axis deviation, left ventricular perjury by voltage criteria, repolarization changes. Telemetry:  Telemetry was personally reviewed and demonstrates: Sinus rhythm  Relevant CV Studies:  Echo 1. Left ventricular ejection fraction, by estimation, is 35%. The left  ventricle has moderately decreased function. The left ventricle  demonstrates global hypokinesis. Left ventricular diastolic parameters are  consistent with Grade II diastolic  dysfunction (pseudonormalization).   2. Right ventricular systolic function is normal. The right ventricular  size is normal. There is moderately elevated pulmonary artery systolic  pressure. The estimated right ventricular systolic pressure is 0000000 mmHg.   3. Left atrial size was mildly dilated.   4. Right atrial size was mildly dilated.   5. The mitral valve is degenerative. Trivial mitral valve regurgitation.  No evidence of mitral stenosis. Moderate mitral annular calcification.   6. The aortic valve is tricuspid. There is mild calcification of the  aortic valve. Aortic valve regurgitation is not visualized. No aortic  stenosis is present.   7. The inferior vena cava is normal in size with <50% respiratory  variability, suggesting  right atrial pressure of 8 mmHg.   Laboratory Data:  Chemistry Recent Labs  Lab 03/06/22 0503 03/06/22 0543 03/06/22 0739 03/06/22 0832  NA 136 135 135 136  K 3.9 3.6 4.2 3.4*  CL 96*  --   --   --   CO2 29  --   --   --   GLUCOSE 150*  --   --   --   BUN 22  --   --   --   CREATININE 1.57*  --   --   --   CALCIUM 9.1  --   --   --   GFRNONAA 47*  --   --   --   ANIONGAP 11  --   --   --     No results for input(s): PROT, ALBUMIN, AST, ALT, ALKPHOS, BILITOT in the last 168 hours. Hematology Recent Labs  Lab 03/06/22 0503 03/06/22 0543 03/06/22 0739 03/06/22 0832  WBC 8.0  --   --   --   RBC 6.21*  --   --   --   HGB 18.6* 20.4* 19.0* 18.7*  HCT 57.4* 60.0* 56.0* 55.0*  MCV 92.4  --   --   --   MCH 30.0  --   --   --   MCHC 32.4  --   --   --   RDW 14.8  --   --   --   PLT 179  --   --   --    Cardiac EnzymesNo results for input(s): TROPONINI in the last 168 hours. No results for input(s): TROPIPOC in the last 168 hours.  BNP Recent Labs  Lab 03/06/22 0503  BNP 1,211.4*    DDimer No results for input(s): DDIMER in the last 168 hours.  Radiology/Studies:  CT Angio Chest PE W and/or Wo Contrast  Result Date: 03/06/2022 CLINICAL DATA:  Shortness of breath and chest pain. EXAM: CT ANGIOGRAPHY CHEST WITH CONTRAST TECHNIQUE: Multidetector CT imaging of the chest was performed using the standard protocol during bolus administration of intravenous contrast. Multiplanar CT image reconstructions and MIPs were obtained to evaluate the vascular anatomy. RADIATION DOSE REDUCTION: This exam was performed according to the departmental dose-optimization program which includes automated exposure control, adjustment of  the mA and/or kV according to patient size and/or use of iterative reconstruction technique. CONTRAST:  52mL OMNIPAQUE IOHEXOL 350 MG/ML SOLN COMPARISON:  Only comparison is portable chest earlier today. FINDINGS: Cardiovascular: Mild panchamber cardiomegaly with  slightly elevated RV/LV ratio of 1.06, IVC and hepatic vein reflux suggesting right heart strain versus tricuspid regurgitation. The pulmonary arteries are upper-normal in caliber and do not show appreciable embolic filling defects. There are scattered three-vessel coronary artery calcifications in the LAD and circumflex. There is no pericardial effusion. The superior pulmonary veins mildly distended. There is mild aortic atherosclerosis tortuosity without aneurysm or dissection. The great vessels are not well enough opacified to evaluate. Mediastinum/Nodes: There are mildly prominent bilateral hilar lymph nodes up to 1.2 cm in short axis on the right, up to 1.1 cm in short axis on the left. There are similar mildly prominent subcarinal and precarinal mediastinal nodes. No thyroid or axillary masses seen. Trachea is patent. Lungs/Pleura: There is no pleural effusion, thickening or pneumothorax. Some respiratory motion is seen on exam. There is interstitial thickening in the bases which could be due to chronic interstitial disease, interstitial pneumonitis or mild interstitial edema, but as above there are no pleural effusions. The upper lobes show early centrilobular emphysematous changes. Central airways are small caliber there are thickened bronchial walls in the upper and lower lobes, to a lesser extent in the right middle lobe with partial mucous plugging of some of the segmental left lower lobe bronchi and a few small bronchial impactions both posterior basal lower lobes. There is a 6 mm noncalcified posterior basal right lower lobe nodule on 6:124. There are several additional bilateral scattered tiny subpleural nodules up to 3 mm in the lower lung fields. Evidence of scattered air trapping noted in the basal segments of the lower lobes but no focal pneumonia. Upper Abdomen: No acute abnormality. Musculoskeletal: There are mild degenerative changes of the thoracic spine. No concerning regional skeletal lesion.  Review of the MIP images confirms the above findings. IMPRESSION: 1. Upper-normal caliber pulmonary arteries without evidence of embolus. 2. Cardiomegaly, slightly elevated RV/LV ratio and IVC and hepatic vein reflux, which could be seen with right heart dysfunction/strain or tricuspid regurgitation. 3. There are mildly prominent superior pulmonary veins, and there is interstitial thickening in the base of the lungs which could be due to chronic change, interstitial pneumonitis or interstitial edema. There is no pleural effusion. 4. Bronchial thickening in all lobes compatible with bronchitis with additional evidence of small airways disease and small bronchial impactions in the posterior bases as well as mucoid impaction in some of the left lower lobe segmental bronchi. 5. Small caliber central airways consistent with bronchospasm or respiratory phase. 6. Evidence of air trapping in the lower lobe basal segments but no focal pneumonia. 7. Mildly prominent hilar and mediastinal nodes. No bulky or encasing adenopathy. 8. 6 mm right lower lobe nodule and a few scattered up to 3 mm subpleural interstitial micronodules in the lower lung fields. Recommend a non-contrast Chest CT at 6-12 months, then another non-contrast Chest CT at 18-24 months. These guidelines do not apply to immunocompromised patients and patients with cancer. Follow up in patients with significant comorbidities as clinically warranted. For lung cancer screening, adhere to Lung-RADS guidelines. Reference: Radiology. 2017; 284(1):228-43. 9. Aortic and coronary artery atherosclerosis. Electronically Signed   By: Telford Nab M.D.   On: 03/06/2022 07:23   DG Chest Port 1 View  Result Date: 03/06/2022 CLINICAL DATA:  71 year old male with history  of severe shortness of breath. EXAM: PORTABLE CHEST 1 VIEW COMPARISON:  No priors. FINDINGS: Lung volumes are normal. No consolidative airspace disease. No pleural effusions. Interstitial prominence is  noted throughout the mid to lower lungs bilaterally where there is some mild peribronchial cuffing. No pneumothorax. No evidence of pulmonary edema. However, there is severe fullness of the hilar regions bilaterally, the appearance of which is favored to reflect dilated central pulmonary arteries. Heart size is mildly enlarged. Upper mediastinal contours are within normal limits. IMPRESSION: 1. Abnormal chest x-ray, without prior studies available for comparison. Follow-up chest CT should be considered to better evaluate the above findings. This could be performed as a high-resolution chest CT if there is clinical concern for interstitial lung disease, or could be performed as a PE protocol CT scan if there is any clinical concern for pulmonary embolism. Electronically Signed   By: Vinnie Langton M.D.   On: 03/06/2022 05:47   ECHOCARDIOGRAM COMPLETE  Result Date: 03/06/2022    ECHOCARDIOGRAM REPORT   Patient Name:   KENDRY FERRAND Date of Exam: 03/06/2022 Medical Rec #:  [id number]    Height:       65.0 in Accession #:    [id number]   Weight:       156.0 lb Date of Birth:  [birth year]    BSA:          1.780 m Patient Age:    32 years     BP:           182/131 mmHg Patient Gender: M            HR:           96 bpm. Exam Location:  Inpatient Procedure: 2D Echo, Color Doppler and Cardiac Doppler Indications:    [id number] Acute systolic (congestive) heart failure  History:        Patient has no prior history of Echocardiogram examinations.  Sonographer:    Raquel Sarna Senior RDCS Referring Phys: [phone] GRACE E BOWSER  Sonographer Comments: Scanned supine on bipap IMPRESSIONS  1. Left ventricular ejection fraction, by estimation, is 35%. The left ventricle has moderately decreased function. The left ventricle demonstrates global hypokinesis. Left ventricular diastolic parameters are consistent with Grade II diastolic dysfunction (pseudonormalization).  2. Right ventricular systolic function is normal. The right ventricular size  is normal. There is moderately elevated pulmonary artery systolic pressure. The estimated right ventricular systolic pressure is 0000000 mmHg.  3. Left atrial size was mildly dilated.  4. Right atrial size was mildly dilated.  5. The mitral valve is degenerative. Trivial mitral valve regurgitation. No evidence of mitral stenosis. Moderate mitral annular calcification.  6. The aortic valve is tricuspid. There is mild calcification of the aortic valve. Aortic valve regurgitation is not visualized. No aortic stenosis is present.  7. The inferior vena cava is normal in size with <50% respiratory variability, suggesting right atrial pressure of 8 mmHg. FINDINGS  Left Ventricle: Left ventricular ejection fraction, by estimation, is 35%. The left ventricle has moderately decreased function. The left ventricle demonstrates global hypokinesis. The left ventricular internal cavity size was normal in size. There is no left ventricular hypertrophy. Left ventricular diastolic parameters are consistent with Grade II diastolic dysfunction (pseudonormalization). Right Ventricle: The right ventricular size is normal. No increase in right ventricular wall thickness. Right ventricular systolic function is normal. There is moderately elevated pulmonary artery systolic pressure. The tricuspid regurgitant velocity is 3.39 m/s, and with an assumed right atrial pressure of 8  mmHg, the estimated right ventricular systolic pressure is 0000000 mmHg. Left Atrium: Left atrial size was mildly dilated. Right Atrium: Right atrial size was mildly dilated. Pericardium: There is no evidence of pericardial effusion. Mitral Valve: The mitral valve is degenerative in appearance. There is mild calcification of the mitral valve leaflet(s). Moderate mitral annular calcification. Trivial mitral valve regurgitation. No evidence of mitral valve stenosis. Tricuspid Valve: The tricuspid valve is normal in structure. Tricuspid valve regurgitation is mild. Aortic  Valve: The aortic valve is tricuspid. There is mild calcification of the aortic valve. Aortic valve regurgitation is not visualized. No aortic stenosis is present. Pulmonic Valve: The pulmonic valve was normal in structure. Pulmonic valve regurgitation is not visualized. Aorta: The aortic root is normal in size and structure. Venous: The inferior vena cava is normal in size with less than 50% respiratory variability, suggesting right atrial pressure of 8 mmHg. IAS/Shunts: No atrial level shunt detected by color flow Doppler.  LEFT VENTRICLE PLAX 2D LVIDd:         5.40 cm     Diastology LVIDs:         4.80 cm     LV e' medial:    4.43 cm/s LV PW:         1.00 cm     LV E/e' medial:  15.7 LV IVS:        0.70 cm     LV e' lateral:   8.85 cm/s LVOT diam:     2.10 cm     LV E/e' lateral: 7.9 LV SV:         50 LV SV Index:   28 LVOT Area:     3.46 cm  LV Volumes (MOD) LV vol d, MOD A2C: 96.4 ml LV vol d, MOD A4C: 94.1 ml LV vol s, MOD A2C: 68.5 ml LV vol s, MOD A4C: 58.6 ml LV SV MOD A2C:     27.9 ml LV SV MOD A4C:     94.1 ml LV SV MOD BP:      31.1 ml RIGHT VENTRICLE RV S prime:     13.20 cm/s TAPSE (M-mode): 2.1 cm LEFT ATRIUM             Index        RIGHT ATRIUM           Index LA diam:        3.90 cm 2.19 cm/m   RA Area:     20.40 cm LA Vol (A2C):   49.6 ml 27.87 ml/m  RA Volume:   64.40 ml  36.18 ml/m LA Vol (A4C):   49.8 ml 27.98 ml/m LA Biplane Vol: 49.9 ml 28.04 ml/m  AORTIC VALVE LVOT Vmax:   85.10 cm/s LVOT Vmean:  62.100 cm/s LVOT VTI:    0.145 m  AORTA Ao Root diam: 3.30 cm MITRAL VALVE               TRICUSPID VALVE MV Area (PHT): 4.04 cm    TR Peak grad:   46.0 mmHg MV Decel Time: 188 msec    TR Vmax:        339.00 cm/s MV E velocity: 69.50 cm/s MV A velocity: 50.70 cm/s  SHUNTS MV E/A ratio:  1.37        Systemic VTI:  0.14 m                            Systemic  Diam: 2.10 cm Dalton McleanMD Electronically signed by Franki Monte Signature Date/Time: 03/06/2022/6:13:37 PM    Final     Assessment  and Plan:   Acute systolic heart failure: The patient has global hypokinesis with an ejection fraction 35%.  However, his presentation is more consistent with an acute respiratory event with hypertensive urgency.  I do not strongly suspect an acute coronary syndrome.  He could be continued on the heparin for 24 hours.  He should be treated with aspirin.  Avoid beta-blockers.  It is reasonable to transition to nitroglycerin paste.  In the a.m. we will start ARB pending his creatinine which is mildly elevated.  Otherwise initiate nitrates and hydralazine.  He will need a right and left heart cath.  I have discussed this with him but he has not yet consented and likely this would be Tuesday as the port is full on Monday.  This has not been scheduled.  CKD: No baseline renal function but suspect this is some chronic renal insufficiency.  Creatinine followed.  RBBB: No old EKGs for comparison.  Hypertension: This can be managed in the context of treating his cardiomyopathy.   For questions or updates, please contact [place] Please consult [email or web address] for contact info under Cardiology/STEMI.   Signed, Minus Breeding, MD  03/06/2022 6:56 PM

## 2022-03-06 NOTE — ED Provider Notes (Signed)
Signout from [employer] at shift change. Briefly, patient presents for SOB, limited history. Extensive wheezing, hypoxia. On BiPAP currently. Has received solumedrol and mag per EMS protocol.    Plan: CT chest pending.    6:42 AM Reassessment performed. Patient appears sleepy. Rouses to voice and slight jostling. Shakes head yes or no to answer questions. When asked if he is feeling any better, does shake head yes.   Labs and imaging personally reviewed and interpreted including: VBG with pH 7.263, retaining CO2 74; neg COVID; BMP glucose 150, creatinine 1.57/BUN 22; CBC hgb elevated 18.6; trop 53; BNP elevated 1211.   EKG tachycardia, RBBB, LVH.   CXR abnl but not really consistent with CHF.    Most current vital signs reviewed and are as follows: BP (!) 189/133   Pulse (!) 101   Temp 98 F (36.7 C) (Oral)   Resp (!) 22   Ht 5\' 5"  (1.651 m)   Wt 70.8 kg   SpO2 96%   BMI 25.96 kg/m   Plan: Continue BiPAP, CTA chest to evalulate for PE/PNA. COPD treatment ongoing. Will need frequent reassessment. Attempting to improve BP currently with NTG paste. Will need admission to hospital.   7:47 AM Reassessment performed. Patient appears stable. Wakes to voice. I am still having trouble getting detailed history information from him.  Pharmacy tech was able to find a couple medications, mainly breathing medications but he is on atorvastatin.  Reviewed pertinent lab work and imaging with patient at bedside. Questions answered.   Most current vital signs reviewed and are as follows: BP (!) 164/108   Pulse 97   Temp 98 F (36.7 C) (Oral)   Resp 19   Ht 5\' 5"  (1.651 m)   Wt 70.8 kg   SpO2 95%   BMI 25.96 kg/m   Plan: Awaiting repeat troponin and repeat blood gas.  Plan to admit to hospital.  On repeat blood gas, pH is stable, pCO2 slightly improved to 70.   8:03 AM Consulted with IMT[facility] resident, discussed prior treatments and evaluation, plan.  They will see for admission.  CRITICAL  CARE Performed by: Carlisle Cater PA-C Total critical care time: 35 minutes Critical care time was exclusive of separately billable procedures and treating other patients. Critical care was necessary to treat or prevent imminent or life-threatening deterioration. Critical care was time spent personally by me on the following activities: development of treatment plan with patient and/or surrogate as well as nursing, discussions with consultants, evaluation of patient's response to treatment, examination of patient, obtaining history from patient or surrogate, ordering and performing treatments and interventions, ordering and review of laboratory studies, ordering and review of radiographic studies, pulse oximetry and re-evaluation of patient's condition.        Carlisle Cater, PA-C 03/06/22 0805    Valarie Merino, MD 03/06/22 [phone]

## 2022-03-06 NOTE — ED Triage Notes (Signed)
Pt having SOB throughout the day. Had 4 nebs at home.  Called ems for SOB. EMS Arrived and pt SPO2  67% while getting neb. With ems SPO2 87%. Ems gave 125 solumedrol, 2Gram mag and 0.5mg  atrovent and one albuterol. Pt with exp wheezing

## 2022-03-06 NOTE — Progress Notes (Signed)
RT assisted with transport of this pt from ED22 to 39M08 while on BiPAP. Pt tolerated well with SVS and no complications. RT gave report to 39M RT. RN currently at bedside.

## 2022-03-06 NOTE — Progress Notes (Signed)
eLink Physician-Brief Progress Note Patient Name: Marcus Tran DOB: 1951-04-22 MRN: [id number]   Date of Service  03/06/2022  HPI/Events of Note  Nursing reports 8 beat run of NSVT. Now NSR with HR = 93. K+ = 3.7, Mg++ = 2.5 and Creatinine = 1.67.  eICU Interventions  Plan: Replace K+.     Intervention Category Major Interventions: Arrhythmia - evaluation and management  Samaira Holzworth Dennard Nip 03/06/2022, 11:55 PM

## 2022-03-06 NOTE — Progress Notes (Signed)
eLink Physician-Brief Progress Note Patient Name: CLARIS GUYMON DOB: [birth year] MRN: [id number]   Date of Service  03/06/2022  HPI/Events of Note  Cardiology recommendations: Acute systolic heart failure: The patient has global hypokinesis with an ejection fraction 35%.  However, his presentation is more consistent with an acute respiratory event with hypertensive urgency.  I do not strongly suspect an acute coronary syndrome.  He could be continued on the heparin for 24 hours.  He should be treated with aspirin.  Avoid beta-blockers.  It is reasonable to transition to nitroglycerin paste.  In the a.m. we will start ARB pending his creatinine which is mildly elevated.  Otherwise initiate nitrates and hydralazine.  He will need a right and left heart cath.  I have discussed this with him but he has not yet consented and likely this would be Tuesday as the port is full on Monday.  This has not been scheduled. Already on a Heparin IV infusion.   eICU Interventions  Plan: ASA 81 mg PO Q day. Nitroglycerin Ointment 2% 1 inch to skin Q 6 hours. Continue Hydralazine 10 mg IV Q 4 hours PRN SBP > 160 for now. Adjust dose when we know what the effect of the Nitroglycerin ointment will be on BP.     Intervention Category Major Interventions: Other:  Lenell Antu 03/06/2022, 10:45 PM

## 2022-03-06 NOTE — Progress Notes (Signed)
RT note. Patient placed on 4 L [place] sat 99%, RR22 no labored breathing noted at this time. RT will continue to monitor. RN aware

## 2022-03-07 ENCOUNTER — Telehealth: Payer: Self-pay | Admitting: Pulmonary Disease

## 2022-03-07 DIAGNOSIS — J9602 Acute respiratory failure with hypercapnia: Secondary | ICD-10-CM | POA: Diagnosis not present

## 2022-03-07 DIAGNOSIS — J9601 Acute respiratory failure with hypoxia: Secondary | ICD-10-CM | POA: Diagnosis not present

## 2022-03-07 LAB — GLUCOSE, CAPILLARY
Glucose-Capillary: 171 mg/dL — ABNORMAL HIGH (ref 70–99)
Glucose-Capillary: 174 mg/dL — ABNORMAL HIGH (ref 70–99)
Glucose-Capillary: 132 mg/dL — ABNORMAL HIGH (ref 70–99)
Glucose-Capillary: 153 mg/dL — ABNORMAL HIGH (ref 70–99)
Glucose-Capillary: 215 mg/dL — ABNORMAL HIGH (ref 70–99)

## 2022-03-07 LAB — CBC
HCT: 51 % (ref 39.0–52.0)
Hemoglobin: 16.5 g/dL (ref 13.0–17.0)
MCH: 29.4 pg (ref 26.0–34.0)
MCHC: 32.4 g/dL (ref 30.0–36.0)
MCV: 90.7 fL (ref 80.0–100.0)
Platelets: 179 10*3/uL (ref 150–400)
RBC: 5.62 MIL/uL (ref 4.22–5.81)
RDW: 14.8 % (ref 11.5–15.5)
WBC: 16.7 10*3/uL — ABNORMAL HIGH (ref 4.0–10.5)
nRBC: 0 % (ref 0.0–0.2)

## 2022-03-07 LAB — BASIC METABOLIC PANEL
Anion gap: 11 (ref 5–15)
Anion gap: 8 (ref 5–15)
BUN: 21 mg/dL (ref 8–23)
BUN: 23 mg/dL (ref 8–23)
CO2: 25 mmol/L (ref 22–32)
CO2: 27 mmol/L (ref 22–32)
Calcium: 8.6 mg/dL — ABNORMAL LOW (ref 8.9–10.3)
Calcium: 8.7 mg/dL — ABNORMAL LOW (ref 8.9–10.3)
Chloride: 98 mmol/L (ref 98–111)
Chloride: 99 mmol/L (ref 98–111)
Creatinine, Ser: 1.51 mg/dL — ABNORMAL HIGH (ref 0.61–1.24)
Creatinine, Ser: 1.55 mg/dL — ABNORMAL HIGH (ref 0.61–1.24)
GFR, Estimated: 49 mL/min — ABNORMAL LOW (ref >60)
GFR, Estimated: 48 mL/min — ABNORMAL LOW (ref >60)
Glucose, Bld: 171 mg/dL — ABNORMAL HIGH (ref 70–99)
Glucose, Bld: 130 mg/dL — ABNORMAL HIGH (ref 70–99)
Potassium: 3.7 mmol/L (ref 3.5–5.1)
Potassium: 4.2 mmol/L (ref 3.5–5.1)
Sodium: 134 mmol/L — ABNORMAL LOW (ref 135–145)
Sodium: 134 mmol/L — ABNORMAL LOW (ref 135–145)

## 2022-03-07 LAB — HEMOGLOBIN A1C
Hgb A1c MFr Bld: 6.9 % — ABNORMAL HIGH (ref 4.8–5.6)
Mean Plasma Glucose: 151.33 mg/dL

## 2022-03-07 LAB — HEPARIN LEVEL (UNFRACTIONATED): Heparin Unfractionated: 0.58 IU/mL (ref 0.30–0.70)

## 2022-03-07 MED ORDER — GUAIFENESIN ER 600 MG PO TB12
600.0000 mg | ORAL_TABLET | Freq: Two times a day (BID) | ORAL | Status: DC
  Administered 2022-03-07 – 2022-03-11 (×9): 600 mg via ORAL
  Filled 2022-03-07 (×10): qty 1

## 2022-03-07 MED ORDER — REVEFENACIN 175 MCG/3ML IN SOLN
175.0000 ug | Freq: Every day | RESPIRATORY_TRACT | Status: DC
  Administered 2022-03-08 – 2022-03-09 (×2): 175 ug via RESPIRATORY_TRACT
  Filled 2022-03-07 (×3): qty 3

## 2022-03-07 MED ORDER — INSULIN ASPART 100 UNIT/ML IJ SOLN
0.0000 [IU] | Freq: Every day | INTRAMUSCULAR | Status: DC
  Administered 2022-03-07: 2 [IU] via SUBCUTANEOUS
  Administered 2022-03-08: 3 [IU] via SUBCUTANEOUS
  Administered 2022-03-09 – 2022-03-10 (×2): 5 [IU] via SUBCUTANEOUS

## 2022-03-07 MED ORDER — SODIUM CHLORIDE 3 % IN NEBU
4.0000 mL | INHALATION_SOLUTION | Freq: Three times a day (TID) | RESPIRATORY_TRACT | Status: AC
  Administered 2022-03-07 – 2022-03-09 (×5): 4 mL via RESPIRATORY_TRACT
  Filled 2022-03-07 (×9): qty 4

## 2022-03-07 MED ORDER — IPRATROPIUM-ALBUTEROL 0.5-2.5 (3) MG/3ML IN SOLN: 3.0000 mL | RESPIRATORY_TRACT | Status: DC | PRN

## 2022-03-07 MED ORDER — INSULIN ASPART 100 UNIT/ML IJ SOLN
0.0000 [IU] | Freq: Three times a day (TID) | INTRAMUSCULAR | Status: DC
  Administered 2022-03-07 (×2): 3 [IU] via SUBCUTANEOUS
  Administered 2022-03-08: 2 [IU] via SUBCUTANEOUS
  Administered 2022-03-08: 3 [IU] via SUBCUTANEOUS
  Administered 2022-03-08: 5 [IU] via SUBCUTANEOUS
  Administered 2022-03-09: 3 [IU] via SUBCUTANEOUS
  Administered 2022-03-09: 2 [IU] via SUBCUTANEOUS
  Administered 2022-03-09 – 2022-03-10 (×2): 3 [IU] via SUBCUTANEOUS
  Administered 2022-03-10: 2 [IU] via SUBCUTANEOUS
  Administered 2022-03-10: 5 [IU] via SUBCUTANEOUS
  Administered 2022-03-11: 3 [IU] via SUBCUTANEOUS
  Administered 2022-03-11: 5 [IU] via SUBCUTANEOUS

## 2022-03-07 MED ORDER — PREDNISONE 20 MG PO TABS
40.0000 mg | ORAL_TABLET | Freq: Every day | ORAL | Status: AC
  Administered 2022-03-07 – 2022-03-11 (×5): 40 mg via ORAL
  Filled 2022-03-07 (×5): qty 2

## 2022-03-07 MED ORDER — HYDRALAZINE HCL 25 MG PO TABS
25.0000 mg | ORAL_TABLET | Freq: Three times a day (TID) | ORAL | Status: DC
  Administered 2022-03-07 – 2022-03-11 (×12): 25 mg via ORAL
  Filled 2022-03-07 (×13): qty 1

## 2022-03-07 NOTE — Progress Notes (Signed)
Cardiac Monitoring Event  Dysrhythmia:  Ventricular tachycardia  Symptoms:  Asymptomatic  Level of Consciousness:  Alert  Last set of vital signs taken:  Temp: 99 F (37.2 C)  HR: 105  Resp: 19  B/P: 146/89  SpO2%: 98%  Name of MD Notified:  Elink  Time MD Notified:  1125pm  Comments/Actions Taken:  see orders

## 2022-03-07 NOTE — Progress Notes (Signed)
RT note. Patient not requiring bipap at this time. Patient currently on 5L [place], no labored breathing noted at this time. Bipap is in patients room if needed later. RT will continue to monitor.

## 2022-03-07 NOTE — Telephone Encounter (Signed)
Please arrange outpatient follow-up visit with me, hospital follow-up, 30-minute slot.  In the next 2 to 4 weeks.  Thank you!

## 2022-03-07 NOTE — Progress Notes (Signed)
NAME:  Marcus Tran, MRN:  [id number], DOB:  09-12-1951, LOS: 1 ADMISSION DATE:  03/06/2022, CONSULTATION DATE:  03/06/22 REFERRING MD:  Court Joy - IMTS, CHIEF COMPLAINT:  Acute on chronic hypoxic and hypercarbic respiratory failure    History of Present Illness:  71 yo M without much known PMH other than tobacco use and prior Rx of bronchodilators (but per pt no formally dx lung disease) presented to [facility] 5/19 with CC SOB. EMS was called and found pt hypoxic SpO2 60s -- was given nebs x4, 2 g mag, 125mg  solumedrol. Placed on NRB In ED pt was placed on BiPAP. VBG on BiPAP 7.26 / 73.7/ 87 -- with subsequent ABG 7.297 / 59/68 and FiO2 incr to 100%  IMTS initially called for admission however with lack of significant improvement in clinical picture, and increasing O2 req, PCCM consulted for admission.   Prior to PCCM arrival, IMTS has ordered steroids and nebs.   Labs at time of consultation most significant for BNP 1200 hs trop 54  CTA neg for PE but is suggestive of fluid overload   Pertinent  Medical History  Tobacco use Possible obstructive lung disease   Significant Hospital Events: Including procedures, antibiotic start and stop dates in addition to other pertinent events   5/19 arrives to ED SOB and hypoxia -- BiPAP for hypoxic/hypercarbic resp failure. Lack of improvement --> PCCM consult  5/20 weaned off BiPAP yesterday, on nasal cannula, coughing with secretions, seems to be feeling better.  Plan to transfer out of ICU.  Interim History / Subjective:  No events overnight.  Tolerated nasal cannula overnight.  No need for BiPAP.  Alert this morning.  Interactive.  In relatively good spirits.  Some increased cough.  Hypertonic saline, Mucinex added.  Continuing steroid for COPD exacerbation.  CTA reviewed personally without any evidence of infiltrate or pneumonia on my interpretation.  TTE performed 5/19 with new finding reduced EF, to be clear no prior to compare to.  Cardiology was  consulted.  Low suspicion for ACS.  24 hours of heparin recommended which continues.  Objective   Blood pressure (!) 158/113, pulse (!) 101, temperature 98 F (36.7 C), temperature source Oral, resp. rate 19, height 5\' 5"  (1.651 m), weight 70.8 kg, SpO2 96 %.    FiO2 (%):  [60 %-90 %] 60 %   Intake/Output Summary (Last 24 hours) at 03/07/2022 0937 Last data filed at 03/07/2022 0801 Gross per 24 hour  Intake 1273.3 ml  Output 3200 ml  Net -1926.7 ml   Filed Weights   03/06/22 0500  Weight: 70.8 kg    Examination: General: Sitting up in bed, drinking coffee HENT: NCAT pink mm anicteric sclera.  Nasal cannula in place Lungs: Distant, mild wheeze Cardiovascular: Distant heart sounds. S1s2 cap refill < 3 sec  Abdomen: soft ndnt + bowel sounds  Extremities: no acute joint deformity. No cyanosis no clubbing  Neuro: Alert, oriented, interactive, follows commands  Resolved Hospital Problem list     Assessment & Plan:   Acute encephalopathy due to hypoxia, hypercarbia  P -resp plan as below -delirium prevention -minimize CNS depressing meds   Acute respiratory failure with hypoxia and hypercarbia Suspected underlying COPD / emphysema on CT scan Hx tobacco abuse  Incidental finding: bilateral hilar lymph nodes 1.2cm on R 1.1 cm on L, subcarinal and precarinal mediastinal nodes, 91mm posterior basal RLL nodule, scattered small subpleural nodules  -CTA neg for PE, no evidence of pneumonia -COVID, flu a/b neg P -Status  post IV Lasix 5/19 -IV steroids converted to oral steroids 5/20, plan total 5 days prednisone 40 mg daily -PPI while on steroids  -BiPAP as needed -Hypertonic saline, Mucinex added 5/20 for secretions/phlegm -will need follow up CT in 3mo for nodules, will arrange outpatient pulmonary follow-up  Suspected acute heart failure, with right heart strain, reduced EF on TTE -RV/LV > 1 on CTA, neg for PE. Mild cardiomegaly  -BNP 1200, troponin elevation P -Status post  IV Lasix 5/19 with good urine output, net -1.7 L, further diuresis per cardiology -Cardiology consultation appreciated -We will complete 24 hours IV heparin 5/20 at cardiology recommendation, low suspicion for ACS -Sounds like plan for left heart cath/right heart cath early next week for further work-up  HTN P -Monitor, with mild AKI with high suspicion for CKD 3a, favor avoiding ACE/ARB at this time  CAD -Plan left heart catheterization later this admission cardiology consultation  CKD stage IIIa -no prior Cr in EMR -- admitting Cr with 1.57 -Avoid nephrotoxins  Stable for transfer out of the ICU 5/20.  Best Practice (right click and "Reselect all SmartList Selections" daily)   Diet/type: Regular consistency (see orders) DVT prophylaxis: prophylactic heparin  GI prophylaxis: PPI Lines: N/A Foley:  N/A Code Status:  full code Last date of multidisciplinary goals of care discussion [pending]  Labs   CBC: Recent Labs  Lab 03/06/22 0503 03/06/22 0543 03/06/22 0739 03/06/22 0832 03/07/22 0213  WBC 8.0  --   --   --  16.7*  NEUTROABS 4.1  --   --   --   --   HGB 18.6* 20.4* 19.0* 18.7* 16.5  HCT 57.4* 60.0* 56.0* 55.0* 51.0  MCV 92.4  --   --   --  90.7  PLT 179  --   --   --  179     Basic Metabolic Panel: Recent Labs  Lab 03/06/22 0503 03/06/22 0543 03/06/22 0739 03/06/22 0832 03/06/22 1841 03/07/22 0213  NA 136 135 135 136 139 134*  K 3.9 3.6 4.2 3.4* 3.7 3.7  CL 96*  --   --   --  96* 98  CO2 29  --   --   --  29 25  GLUCOSE 150*  --   --   --  231* 171*  BUN 22  --   --   --  18 21  CREATININE 1.57*  --   --   --  1.67* 1.51*  CALCIUM 9.1  --   --   --  8.7* 8.6*  MG  --   --   --   --  2.5*  --   PHOS  --   --   --   --  3.7  --     GFR: Estimated Creatinine Clearance: 39 mL/min (A) (by C-G formula based on SCr of 1.51 mg/dL (H)). Recent Labs  Lab 03/06/22 0503 03/06/22 1121 03/07/22 0213  PROCALCITON  --  <0.10  --   WBC 8.0  --  16.7*      Liver Function Tests: No results for input(s): AST, ALT, ALKPHOS, BILITOT, PROT, ALBUMIN in the last 168 hours. No results for input(s): LIPASE, AMYLASE in the last 168 hours. No results for input(s): AMMONIA in the last 168 hours.  ABG    Component Value Date/Time   PHART 7.297 (L) 03/06/2022 0832   PCO2ART 59.2 (H) 03/06/2022 0832   PO2ART 68 (L) 03/06/2022 0832   HCO3 29.0 (H) 03/06/2022 4827  TCO2 31 03/06/2022 0832   O2SAT 91 03/06/2022 0832      Coagulation Profile: No results for input(s): INR, PROTIME in the last 168 hours.  Cardiac Enzymes: No results for input(s): CKTOTAL, CKMB, CKMBINDEX, TROPONINI in the last 168 hours.  HbA1C: No results found for: HGBA1C  CBG: Recent Labs  Lab 03/06/22 1025 03/06/22 1524 03/06/22 1936 03/07/22 0718  GLUCAP 190* 214* 198* 171*    Review of Systems:   Unable to obtain due to encephalopathy   Past Medical History:  He,  has a past medical history of Dyslipidemia.   Surgical History:  No surgical history  Social History:    Tobacco use   Family History:  His family history is not on file.   Allergies No Known Allergies   Home Medications  Prior to Admission medications   Medication Sig Start Date End Date Taking? Authorizing Provider  albuterol (VENTOLIN HFA) 108 (90 Base) MCG/ACT inhaler Inhale 1-2 puffs into the lungs every 4 (four) hours as needed for wheezing or shortness of breath. 11/19/21   [provider]  atorvastatin (LIPITOR) 40 MG tablet Take 40 mg by mouth at bedtime. 11/19/21   [provider]  BREZTRI AEROSPHERE 160-9-4.8 MCG/ACT AERO Take 2 puffs by mouth 2 (two) times daily. 12/23/21   [provider]  ipratropium-albuterol (DUONEB) 0.5-2.5 (3) MG/3ML SOLN Take by nebulization. 02/10/22   [provider]  SYMBICORT 160-4.5 MCG/ACT inhaler Inhale 2 puffs into the lungs in the morning and at bedtime. 11/21/21   [provider]     Critical care time:  n/a       Lanier Clam, MD [place] for contact information 03/07/2022, 9:37 AM

## 2022-03-07 NOTE — Progress Notes (Signed)
[facility] for heparin Indication: chest pain/ACS Brief A/P: Heparin level within goal range Continue Heparin at current rate   No Known Allergies  Patient Measurements: Height: 5\' 5"  (165.1 cm) Weight: 70.8 kg (156 lb) IBW/kg (Calculated) : 61.5 Heparin Dosing Weight: 70.8 kg  Vital Signs: Temp: 97.3 F (36.3 C) (05/20 0348) Temp Source: Axillary (05/20 0348) BP: 151/95 (05/20 0200) Pulse Rate: 95 (05/20 0200)  Labs: Recent Labs    03/06/22 0503 03/06/22 0543 03/06/22 0737 03/06/22 0739 03/06/22 0832 03/06/22 1841 03/06/22 2155 03/07/22 0213  HGB 18.6*   < >  --  19.0* 18.7*  --   --  16.5  HCT 57.4*   < >  --  56.0* 55.0*  --   --  51.0  PLT 179  --   --   --   --   --   --  179  HEPARINUNFRC  --   --   --   --   --   --   --  0.58  CREATININE 1.57*  --   --   --   --  1.67*  --  1.51*  TROPONINIHS 53*  --  46*  --   --  45* 46*  --    < > = values in this interval not displayed.     Estimated Creatinine Clearance: 39 mL/min (A) (by C-G formula based on SCr of 1.51 mg/dL (H)).  Assessment: 71 y.o. male with possible ACS for heparin  Goal of Therapy:  Heparin level 0.3-0.7 units/ml Monitor platelets by anticoagulation protocol: Yes   Plan:  Continue Heparin at current rate    Phillis Knack, PharmD, BCPS

## 2022-03-08 DIAGNOSIS — G9341 Metabolic encephalopathy: Secondary | ICD-10-CM

## 2022-03-08 DIAGNOSIS — J9601 Acute respiratory failure with hypoxia: Secondary | ICD-10-CM

## 2022-03-08 DIAGNOSIS — Z720 Tobacco use: Secondary | ICD-10-CM

## 2022-03-08 DIAGNOSIS — J9602 Acute respiratory failure with hypercapnia: Secondary | ICD-10-CM

## 2022-03-08 DIAGNOSIS — R590 Localized enlarged lymph nodes: Secondary | ICD-10-CM

## 2022-03-08 DIAGNOSIS — N1831 Chronic kidney disease, stage 3a: Secondary | ICD-10-CM

## 2022-03-08 DIAGNOSIS — F17200 Nicotine dependence, unspecified, uncomplicated: Secondary | ICD-10-CM

## 2022-03-08 DIAGNOSIS — I10 Essential (primary) hypertension: Secondary | ICD-10-CM

## 2022-03-08 DIAGNOSIS — I2510 Atherosclerotic heart disease of native coronary artery without angina pectoris: Secondary | ICD-10-CM

## 2022-03-08 HISTORY — DX: Metabolic encephalopathy: G93.41

## 2022-03-08 LAB — CBC
HCT: 46.7 % (ref 39.0–52.0)
Hemoglobin: 15.6 g/dL (ref 13.0–17.0)
MCH: 30.3 pg (ref 26.0–34.0)
MCHC: 33.4 g/dL (ref 30.0–36.0)
MCV: 90.7 fL (ref 80.0–100.0)
Platelets: 177 10*3/uL (ref 150–400)
RBC: 5.15 MIL/uL (ref 4.22–5.81)
RDW: 15 % (ref 11.5–15.5)
WBC: 19.2 10*3/uL — ABNORMAL HIGH (ref 4.0–10.5)
nRBC: 0 % (ref 0.0–0.2)

## 2022-03-08 LAB — BASIC METABOLIC PANEL
Anion gap: 5 (ref 5–15)
BUN: 25 mg/dL — ABNORMAL HIGH (ref 8–23)
CO2: 26 mmol/L (ref 22–32)
Calcium: 8.2 mg/dL — ABNORMAL LOW (ref 8.9–10.3)
Chloride: 100 mmol/L (ref 98–111)
Creatinine, Ser: 1.66 mg/dL — ABNORMAL HIGH (ref 0.61–1.24)
GFR, Estimated: 44 mL/min — ABNORMAL LOW (ref >60)
Glucose, Bld: 193 mg/dL — ABNORMAL HIGH (ref 70–99)
Potassium: 4.4 mmol/L (ref 3.5–5.1)
Sodium: 131 mmol/L — ABNORMAL LOW (ref 135–145)

## 2022-03-08 LAB — MAGNESIUM: Magnesium: 2.3 mg/dL (ref 1.7–2.4)

## 2022-03-08 LAB — GLUCOSE, CAPILLARY
Glucose-Capillary: 135 mg/dL — ABNORMAL HIGH (ref 70–99)
Glucose-Capillary: 185 mg/dL — ABNORMAL HIGH (ref 70–99)
Glucose-Capillary: 229 mg/dL — ABNORMAL HIGH (ref 70–99)
Glucose-Capillary: 272 mg/dL — ABNORMAL HIGH (ref 70–99)

## 2022-03-08 MED ORDER — ISOSORBIDE DINITRATE 20 MG PO TABS
20.0000 mg | ORAL_TABLET | Freq: Two times a day (BID) | ORAL | Status: DC
  Administered 2022-03-08 (×2): 20 mg via ORAL
  Filled 2022-03-08 (×4): qty 1

## 2022-03-08 MED ORDER — HEPARIN SODIUM (PORCINE) 5000 UNIT/ML IJ SOLN
5000.0000 [IU] | Freq: Three times a day (TID) | INTRAMUSCULAR | Status: DC
Start: 2022-03-08 — End: 2022-03-08

## 2022-03-08 NOTE — Plan of Care (Signed)
  Problem: Education: Goal: Knowledge of General Education information will improve Description: Including pain rating scale, medication(s)/side effects and non-pharmacologic comfort measures Outcome: Progressing   Problem: Health Behavior/Discharge Planning: Goal: Ability to manage health-related needs will improve Outcome: Progressing   Problem: Clinical Measurements: Goal: Ability to maintain clinical measurements within normal limits will improve Outcome: Progressing Goal: Will remain free from infection Outcome: Progressing Goal: Diagnostic test results will improve Outcome: Progressing Goal: Respiratory complications will improve Outcome: Progressing Goal: Cardiovascular complication will be avoided Outcome: Progressing   Problem: Activity: Goal: Risk for activity intolerance will decrease Outcome: Progressing   Problem: Elimination: Goal: Will not experience complications related to bowel motility Outcome: Progressing   Problem: Safety: Goal: Ability to remain free from injury will improve Outcome: Progressing   Problem: Pain Managment: Goal: General experience of comfort will improve Outcome: Progressing

## 2022-03-08 NOTE — Evaluation (Signed)
Physical Therapy Evaluation Patient Details Name: Marcus Tran MRN: [id number] DOB: [birth year] Today's Date: 03/08/2022  History of Present Illness  71 y.o. male presents to [facility] hospital on 03/06/2022 with SOB. Pt found to be hypoxic into 60s and required BiPAP placement in ED. Pt admitted for management for hypoxic/hypercarbic resp failure and acute systolic heart failure. PMH includes progressive obstructive lung disease and tobacco use.  Clinical Impression  Pt presents to PT with deficits in cardiopulmonary function and endurance. Pt does desaturate below 90% when mobilizing on room air, reporting DOE with stair negotiation. Pt will benefit from frequent mobilization in an effort to restore endurance. PT will continue to follow in an effort to mobilize frequently. PT recommends no PT or DME at the time of discharge.       Recommendations for follow up therapy are one component of a multi-disciplinary discharge planning process, led by the attending physician.  Recommendations may be updated based on patient status, additional functional criteria and insurance authorization.  Follow Up Recommendations No PT follow up    Assistance Recommended at Discharge None  Patient can return home with the following       Equipment Recommendations None recommended by PT  Recommendations for Other Services       Functional Status Assessment Patient has had a recent decline in their functional status and demonstrates the ability to make significant improvements in function in a reasonable and predictable amount of time. (endurance)     Precautions / Restrictions Precautions Precautions: Other (comment) Precaution Comments: monitor SpO2 Restrictions Weight Bearing Restrictions: No      Mobility  Bed Mobility                    Transfers Overall transfer level: Independent                      Ambulation/Gait Ambulation/Gait assistance: Modified independent  (Device/Increase time) Gait Distance (Feet): 250 Feet Assistive device: None Gait Pattern/deviations: WFL(Within Functional Limits) Gait velocity: functional Gait velocity interpretation: >2.62 ft/sec, indicative of community ambulatory   General Gait Details: steady step-through gait  Stairs Stairs: Yes Stairs assistance: Modified independent (Device/Increase time) Stair Management: One rail Left, Two rails, Alternating pattern, Forwards Number of Stairs: 20    Wheelchair Mobility    Modified Rankin (Stroke Patients Only)       Balance Overall balance assessment: No apparent balance deficits (not formally assessed)                                           Pertinent Vitals/Pain Pain Assessment Pain Assessment: No/denies pain    Home Living Family/patient expects to be discharged to:: Private residence Living Arrangements: Children;Other relatives Available Help at Discharge: Family;Available PRN/intermittently Type of Home: House Home Access: Stairs to enter   Entrance Stairs-Number of Steps: 2 Alternate Level Stairs-Number of Steps: split level, 5-6 up and down Home Layout: Multi-level Home Equipment: None      Prior Function Prior Level of Function : Independent/Modified Independent;Driving                     Hand Dominance        Extremity/Trunk Assessment   Upper Extremity Assessment Upper Extremity Assessment: Overall WFL for tasks assessed    Lower Extremity Assessment Lower Extremity Assessment: Overall WFL for tasks assessed  Cervical / Trunk Assessment Cervical / Trunk Assessment: Normal  Communication   Communication: Other (comment) (mild dysarthria, difficult to understand)  Cognition Arousal/Alertness: Awake/alert Behavior During Therapy: WFL for tasks assessed/performed Overall Cognitive Status: Within Functional Limits for tasks assessed                                           General Comments General comments (skin integrity, edema, etc.): pt with desat to 88% when ambulating on room air, recovers to 92% when seated and resting. Pt reports increased work of breathing with stair negotiation but not with walking    Exercises     Assessment/Plan    PT Assessment Patient needs continued PT services  PT Problem List Decreased activity tolerance       PT Treatment Interventions Therapeutic exercise;Patient/family education;Therapeutic activities    PT Goals (Current goals can be found in the Care Plan section)  Acute Rehab PT Goals Patient Stated Goal: to improve activity tolerance PT Goal Formulation: With patient Time For Goal Achievement: 03/22/22 Potential to Achieve Goals: Good Additional Goals Additional Goal #1: Pt will ambulate for >300' with reports of DOE of 2/10 or less, maintaining sats >90% on room air, to demonstrate improved tolerance for community mobility    Frequency Min 2X/week     Co-evaluation               AM-PAC PT "6 Clicks" Mobility  Outcome Measure Help needed turning from your back to your side while in a flat bed without using bedrails?: None Help needed moving from lying on your back to sitting on the side of a flat bed without using bedrails?: None Help needed moving to and from a bed to a chair (including a wheelchair)?: None Help needed standing up from a chair using your arms (e.g., wheelchair or bedside chair)?: None Help needed to walk in hospital room?: None Help needed climbing 3-5 steps with a railing? : None 6 Click Score: 24    End of Session   Activity Tolerance: Patient tolerated treatment well Patient left: in chair;with call bell/phone within reach Nurse Communication: Mobility status PT Visit Diagnosis: Other abnormalities of gait and mobility (R26.89)    Time: 8250-5397 PT Time Calculation (min) (ACUTE ONLY): 12 min   Charges:   PT Evaluation $PT Eval Low Complexity: 1 Low           Arlyss Gandy, PT, DPT Acute Rehabilitation Pager: [phone] Office [phone]   Arlyss Gandy 03/08/2022, 12:55 PM

## 2022-03-08 NOTE — Progress Notes (Signed)
Pt not requiring BiPAP at this time. Currently on comfortable on 2L [place]. Will continue to monitor.

## 2022-03-08 NOTE — Progress Notes (Signed)
Progress Note  Patient Name: Marcus Tran Date of Encounter: 03/08/2022  Primary Cardiologist: Minus Breeding, MD   Subjective   No chest pain or sob. Dyspnea is improved.   Inpatient Medications    Scheduled Meds:  arformoterol  15 mcg Nebulization BID   aspirin  81 mg Oral Daily   budesonide (PULMICORT) nebulizer solution  0.5 mg Nebulization BID   chlorhexidine  15 mL Mouth Rinse BID   Chlorhexidine Gluconate Cloth  6 each Topical Q0600   guaiFENesin  600 mg Oral BID   hydrALAZINE  25 mg Oral Q8H   insulin aspart  0-15 Units Subcutaneous TID WC   insulin aspart  0-5 Units Subcutaneous QHS   mouth rinse  15 mL Mouth Rinse q12n4p   pantoprazole (PROTONIX) IV  40 mg Intravenous QHS   predniSONE  40 mg Oral Q breakfast   revefenacin  175 mcg Nebulization Daily   sodium chloride HYPERTONIC  4 mL Nebulization TID   Continuous Infusions:  sodium chloride Stopped (03/07/22 1735)   PRN Meds: sodium chloride, docusate sodium, hydrALAZINE, ipratropium-albuterol, nitroGLYCERIN, ondansetron (ZOFRAN) IV, polyethylene glycol   Vital Signs    Vitals:   03/08/22 0415 03/08/22 0551 03/08/22 0750 03/08/22 0937  BP:  (!) 139/92 (!) 147/97   Pulse:  93    Resp:   16   Temp:      TempSrc:      SpO2:    93%  Weight: 72.7 kg     Height:        Intake/Output Summary (Last 24 hours) at 03/08/2022 0957 Last data filed at 03/08/2022 0400 Gross per 24 hour  Intake 875.94 ml  Output 1550 ml  Net -674.06 ml   Filed Weights   03/06/22 0500 03/08/22 0415  Weight: 70.8 kg 72.7 kg    Telemetry    Nsr, NSVT - Personally Reviewed  ECG    none - Personally Reviewed  Physical Exam   GEN: No acute distress.   Neck: 7 cm JVD Cardiac: RRR, no murmurs, rubs, S3 gallop is present.  Respiratory: Clear to auscultation bilaterally except for basilar rales. GI: Soft, nontender, non-distended  MS: No edema; No deformity. Neuro:  Nonfocal  Psych: Normal affect   Labs     Chemistry Recent Labs  Lab 03/07/22 0213 03/07/22 1422 03/08/22 0044  NA 134* 134* 131*  K 3.7 4.2 4.4  CL 98 99 100  CO2 25 27 26   GLUCOSE 171* 130* 193*  BUN 21 23 25*  CREATININE 1.51* 1.55* 1.66*  CALCIUM 8.6* 8.7* 8.2*  GFRNONAA 49* 48* 44*  ANIONGAP 11 8 5      Hematology Recent Labs  Lab 03/06/22 0503 03/06/22 0543 03/06/22 0832 03/07/22 0213 03/08/22 0044  WBC 8.0  --   --  16.7* 19.2*  RBC 6.21*  --   --  5.62 5.15  HGB 18.6*   < > 18.7* 16.5 15.6  HCT 57.4*   < > 55.0* 51.0 46.7  MCV 92.4  --   --  90.7 90.7  MCH 30.0  --   --  29.4 30.3  MCHC 32.4  --   --  32.4 33.4  RDW 14.8  --   --  14.8 15.0  PLT 179  --   --  179 177   < > = values in this interval not displayed.    Cardiac EnzymesNo results for input(s): TROPONINI in the last 168 hours. No results for input(s): TROPIPOC in the  last 168 hours.   BNP Recent Labs  Lab 03/06/22 0503  BNP 1,211.4*     DDimer No results for input(s): DDIMER in the last 168 hours.   Radiology    No results found.  Cardiac Studies   2D echo reviewed.  Patient Profile     71 y.o. male admitted with acute systolic heart failure and uncontrolled HTN in the setting of stage 3 renal insufficiency  Assessment & Plan    Acute systolic heart failure - he is feeling better. Hydralazine added. We will start isosorbide today. Left/right heart cath tomorrow or Tuesday, schedule permitting. Chronic renal insuff - his creatinine is stable.  HTN - his bp is elevated. Started isosorbide.     For questions or updates, please contact [place] Please consult [email or web address] for contact info under Cardiology/STEMI.      Signed, Cristopher Peru, MD  03/08/2022, 9:57 AM

## 2022-03-08 NOTE — Progress Notes (Addendum)
PROGRESS NOTE    Marcus Tran  [id number] DOB: 1951/06/08 DOA: 03/06/2022 PCP: Cipriano Mile, NP   Brief Narrative:  71 yo M with limited past medical history - known tobacco use/abuse and prior use of bronchodilators but no formal diagnosis of asthma/COPD. Patient presented to with worsening shortness of breath and dyspnea with exertion - hypoxic into 60s in the field -improved with steroids, nebs, and NRB ultimately requiring Bipap at intake. CTA neg for PE but is suggestive of fluid overload.   5/19 admitted requiring BiPAP for hypoxic/hypercarbic resp failure; admitted to ICU under PCCM care 5/20 weaned off BiPAP to nasal cannula with markedly improving symptoms 5/21 continues to wean oxygen as appropriate, symptoms markedly improving   Assessment & Plan:   Principal Problem:   Acute respiratory failure with hypoxia and hypercapnia (HCC) Active Problems:   Acute metabolic encephalopathy   Tobacco abuse   Bilateral hilar adenopathy syndrome   HTN (hypertension)   Stage 3a chronic kidney disease (CKD) (HCC)   Coronary artery disease  Acute metabolic encephalopathy due to hypoxia/hypercarbia, POA -Resolving with treatment as below  Acute respiratory failure with hypoxia and hypercarbia Suspected underlying COPD / emphysema on CT scan Hx tobacco abuse  Incidental finding: bilateral hilar lymph nodes 1.2cm on R 1.1 cm on L, subcarinal and precarinal mediastinal nodes, 74mm posterior basal RLL nodule, scattered small subpleural nodules  -CTA neg for PE, no evidence of pneumonia -COVID, flu a/b neg -Continue steroids x5 days per PCCM recommendations -Wean oxygen as tolerated, BiPAP as needed(not required in over 24 hours) -Outpatient CT in 18mo for incidentally noted pulmonary nodules -pulmonology to follow  Newly diagnosed acute systolic heart failure EF 35% -Elevated BNP/troponin -Status post Lasix x1 with appropriate output -Cardiology following, appreciate insight and  recommendations -Planned heart cath 03/10/2022   HTN Continue hydralazine, isosorbide -Pending cardiac findings may benefit from ACE/ARB versus Entresto given EF of 35%   CAD -Plan left heart catheterization later this admission cardiology consultation   CKD stage IIIa -no prior Cr in EMR -- admitting Cr with 1.57 -Stable, likely at baseline  DVT prophylaxis: Heparin held perioperatively Code Status: Full Family Communication: None present  Status is: Inpatient  Dispo: The patient is from: Home              Anticipated d/c is to: Home              Anticipated d/c date is: 24 to 48 hours              Patient currently not medically stable for discharge  Consultants:  Cardiology, PCCM  Procedures:  Cardiac cath 03/10/2022  Antimicrobials:  None indicated  Subjective: No acute issues or events overnight denies nausea vomiting diarrhea constipation headache fevers chills or chest pain  Objective: Vitals:   03/08/22 0409 03/08/22 0415 03/08/22 0551 03/08/22 0750  BP: (!) 157/109  (!) 139/92 (!) 147/97  Pulse: 94  93   Resp: (!) 24   16  Temp: 98.8 F (37.1 C)     TempSrc: Oral     SpO2: 98%     Weight:  72.7 kg    Height:        Intake/Output Summary (Last 24 hours) at 03/08/2022 0800 Last data filed at 03/08/2022 0400 Gross per 24 hour  Intake 914.93 ml  Output 1550 ml  Net -635.07 ml   Filed Weights   03/06/22 0500 03/08/22 0415  Weight: 70.8 kg 72.7 kg    Examination:  General exam: Appears calm and comfortable  Respiratory system: Clear to auscultation. Respiratory effort normal. Cardiovascular system: S1 & S2 heard, RRR. No JVD, murmurs, rubs, gallops or clicks. No pedal edema. Gastrointestinal system: Abdomen is nondistended, soft and nontender. No organomegaly or masses felt. Normal bowel sounds heard. Central nervous system: Alert and oriented. No focal neurological deficits. Extremities: Symmetric 5 x 5 power. Skin: No rashes, lesions or  ulcers Psychiatry: Judgement and insight appear normal. Mood & affect appropriate.     Data Reviewed: I have personally reviewed following labs and imaging studies  CBC: Recent Labs  Lab 03/06/22 0503 03/06/22 0543 03/06/22 0739 03/06/22 0832 03/07/22 0213 03/08/22 0044  WBC 8.0  --   --   --  16.7* 19.2*  NEUTROABS 4.1  --   --   --   --   --   HGB 18.6* 20.4* 19.0* 18.7* 16.5 15.6  HCT 57.4* 60.0* 56.0* 55.0* 51.0 46.7  MCV 92.4  --   --   --  90.7 90.7  PLT 179  --   --   --  179 [id number]   Basic Metabolic Panel: Recent Labs  Lab 03/06/22 0503 03/06/22 0543 03/06/22 0832 03/06/22 1841 03/07/22 0213 03/07/22 1422 03/08/22 0044  NA 136   < > 136 139 134* 134* 131*  K 3.9   < > 3.4* 3.7 3.7 4.2 4.4  CL 96*  --   --  96* 98 99 100  CO2 29  --   --  29 25 27 26   GLUCOSE 150*  --   --  231* 171* 130* 193*  BUN 22  --   --  18 21 23  25*  CREATININE 1.57*  --   --  1.67* 1.51* 1.55* 1.66*  CALCIUM 9.1  --   --  8.7* 8.6* 8.7* 8.2*  MG  --   --   --  2.5*  --   --  2.3  PHOS  --   --   --  3.7  --   --   --    < > = values in this interval not displayed.   GFR: Estimated Creatinine Clearance: 35.5 mL/min (A) (by C-G formula based on SCr of 1.66 mg/dL (H)). Liver Function Tests: No results for input(s): AST, ALT, ALKPHOS, BILITOT, PROT, ALBUMIN in the last 168 hours. No results for input(s): LIPASE, AMYLASE in the last 168 hours. No results for input(s): AMMONIA in the last 168 hours. Coagulation Profile: No results for input(s): INR, PROTIME in the last 168 hours. Cardiac Enzymes: No results for input(s): CKTOTAL, CKMB, CKMBINDEX, TROPONINI in the last 168 hours. BNP (last 3 results) No results for input(s): PROBNP in the last 8760 hours. HbA1C: Recent Labs    03/07/22 1422  HGBA1C 6.9*   CBG: Recent Labs  Lab 03/07/22 0718 03/07/22 1114 03/07/22 1516 03/07/22 1700 03/07/22 2109  GLUCAP 171* 174* 132* 153* 215*   Lipid Profile: No results for input(s):  CHOL, HDL, LDLCALC, TRIG, CHOLHDL, LDLDIRECT in the last 72 hours. Thyroid Function Tests: No results for input(s): TSH, T4TOTAL, FREET4, T3FREE, THYROIDAB in the last 72 hours. Anemia Panel: No results for input(s): VITAMINB12, FOLATE, FERRITIN, TIBC, IRON, RETICCTPCT in the last 72 hours. Sepsis Labs: Recent Labs  Lab 03/06/22 1121  PROCALCITON <0.10    Recent Results (from the past 240 hour(s))  Resp Panel by RT-PCR (Flu A&B, Covid) Nasopharyngeal Swab     Status: None   Collection Time: 03/06/22  5:22  AM   Specimen: Nasopharyngeal Swab; Nasopharyngeal(NP) swabs in vial transport medium  Result Value Ref Range Status   SARS Coronavirus 2 by RT PCR NEGATIVE NEGATIVE Final    Comment: (NOTE) SARS-CoV-2 target nucleic acids are NOT DETECTED.  The SARS-CoV-2 RNA is generally detectable in upper respiratory specimens during the acute phase of infection. The lowest concentration of SARS-CoV-2 viral copies this assay can detect is 138 copies/mL. A negative result does not preclude SARS-Cov-2 infection and should not be used as the sole basis for treatment or other patient management decisions. A negative result may occur with  improper specimen collection/handling, submission of specimen other than nasopharyngeal swab, presence of viral mutation(s) within the areas targeted by this assay, and inadequate number of viral copies(<138 copies/mL). A negative result must be combined with clinical observations, patient history, and epidemiological information. The expected result is Negative.  Fact Sheet for Patients:  EntrepreneurPulse.com.au  Fact Sheet for Healthcare Providers:  IncredibleEmployment.be  This test is no t yet approved or cleared by the [place] FDA and  has been authorized for detection and/or diagnosis of SARS-CoV-2 by FDA under an Emergency Use Authorization (EUA). This EUA will remain  in effect (meaning this test can be  used) for the duration of the COVID-19 declaration under Section 564(b)(1) of the Act, 21 U.S.C.section 360bbb-3(b)(1), unless the authorization is terminated  or revoked sooner.       Influenza A by PCR NEGATIVE NEGATIVE Final   Influenza B by PCR NEGATIVE NEGATIVE Final    Comment: (NOTE) The Xpert Xpress SARS-CoV-2/FLU/RSV plus assay is intended as an aid in the diagnosis of influenza from Nasopharyngeal swab specimens and should not be used as a sole basis for treatment. Nasal washings and aspirates are unacceptable for Xpert Xpress SARS-CoV-2/FLU/RSV testing.  Fact Sheet for Patients: EntrepreneurPulse.com.au  Fact Sheet for Healthcare Providers: IncredibleEmployment.be  This test is not yet approved or cleared by the [place] FDA and has been authorized for detection and/or diagnosis of SARS-CoV-2 by FDA under an Emergency Use Authorization (EUA). This EUA will remain in effect (meaning this test can be used) for the duration of the COVID-19 declaration under Section 564(b)(1) of the Act, 21 U.S.C. section 360bbb-3(b)(1), unless the authorization is terminated or revoked.  Performed at [facility], [place] [address] [place], [place] [address]   Culture, blood (Routine X 2) w Reflex to ID Panel     Status: None (Preliminary result)   Collection Time: 03/06/22  8:28 AM   Specimen: BLOOD  Result Value Ref Range Status   Specimen Description BLOOD SITE NOT SPECIFIED  Final   Special Requests   Final    BOTTLES DRAWN AEROBIC AND ANAEROBIC Blood Culture adequate volume   Culture   Final    NO GROWTH < 24 HOURS Performed at [facility], [place] [address], [place], [place] [address]    Report Status PENDING  Incomplete  Respiratory (~20 pathogens) panel by PCR     Status: None   Collection Time: 03/06/22  8:39 AM   Specimen: Nasopharyngeal Swab; Respiratory  Result Value Ref Range Status   Adenovirus NOT DETECTED NOT  DETECTED Final   Coronavirus 229E NOT DETECTED NOT DETECTED Final    Comment: (NOTE) The Coronavirus on the Respiratory Panel, DOES NOT test for the novel  Coronavirus (2019 nCoV)    Coronavirus HKU1 NOT DETECTED NOT DETECTED Final   Coronavirus NL63 NOT DETECTED NOT DETECTED Final   Coronavirus OC43 NOT  DETECTED NOT DETECTED Final   Metapneumovirus NOT DETECTED NOT DETECTED Final   Rhinovirus / Enterovirus NOT DETECTED NOT DETECTED Final   Influenza A NOT DETECTED NOT DETECTED Final   Influenza B NOT DETECTED NOT DETECTED Final   Parainfluenza Virus 1 NOT DETECTED NOT DETECTED Final   Parainfluenza Virus 2 NOT DETECTED NOT DETECTED Final   Parainfluenza Virus 3 NOT DETECTED NOT DETECTED Final   Parainfluenza Virus 4 NOT DETECTED NOT DETECTED Final   Respiratory Syncytial Virus NOT DETECTED NOT DETECTED Final   Bordetella pertussis NOT DETECTED NOT DETECTED Final   Bordetella Parapertussis NOT DETECTED NOT DETECTED Final   Chlamydophila pneumoniae NOT DETECTED NOT DETECTED Final   Mycoplasma pneumoniae NOT DETECTED NOT DETECTED Final    Comment: Performed at [facility], [place] [address], [place], [place] [address]  Culture, blood (Routine X 2) w Reflex to ID Panel     Status: None (Preliminary result)   Collection Time: 03/06/22  9:02 AM   Specimen: BLOOD  Result Value Ref Range Status   Specimen Description BLOOD SITE NOT SPECIFIED  Final   Special Requests   Final    BOTTLES DRAWN AEROBIC AND ANAEROBIC Blood Culture results may not be optimal due to an inadequate volume of blood received in culture bottles   Culture   Final    NO GROWTH < 24 HOURS Performed at [facility], [place] [address] [place], [place] [address]    Report Status PENDING  Incomplete  MRSA Next Gen by PCR, Nasal     Status: None   Collection Time: 03/06/22 10:33 AM   Specimen: Nasal Mucosa; Nasal Swab  Result Value Ref Range Status   MRSA by PCR Next Gen NOT DETECTED NOT DETECTED Final     Comment: (NOTE) The GeneXpert MRSA Assay (FDA approved for NASAL specimens only), is one component of a comprehensive MRSA colonization surveillance program. It is not intended to diagnose MRSA infection nor to guide or monitor treatment for MRSA infections. Test performance is not FDA approved in patients less than 36 years old. Performed at [facility], [place] [address] [place], [place] [address]          Radiology Studies: ECHOCARDIOGRAM COMPLETE  Result Date: 03/06/2022    ECHOCARDIOGRAM REPORT   Patient Name:   SHIMON KANITZ Date of Exam: 03/06/2022 Medical Rec #:  [id number]    Height:       65.0 in Accession #:    [id number]   Weight:       156.0 lb Date of Birth:  August 11, 1951    BSA:          1.780 m Patient Age:    39 years     BP:           182/131 mmHg Patient Gender: M            HR:           96 bpm. Exam Location:  Inpatient Procedure: 2D Echo, Color Doppler and Cardiac Doppler Indications:    [id number] Acute systolic (congestive) heart failure  History:        Patient has no prior history of Echocardiogram examinations.  Sonographer:    Raquel Sarna Senior RDCS Referring Phys: [phone] GRACE E BOWSER  Sonographer Comments: Scanned supine on bipap IMPRESSIONS  1. Left ventricular ejection fraction, by estimation, is 35%. The left ventricle has moderately decreased function. The left ventricle demonstrates global hypokinesis. Left ventricular diastolic parameters  are consistent with Grade II diastolic dysfunction (pseudonormalization).  2. Right ventricular systolic function is normal. The right ventricular size is normal. There is moderately elevated pulmonary artery systolic pressure. The estimated right ventricular systolic pressure is 0000000 mmHg.  3. Left atrial size was mildly dilated.  4. Right atrial size was mildly dilated.  5. The mitral valve is degenerative. Trivial mitral valve regurgitation. No evidence of mitral stenosis. Moderate mitral annular calcification.  6. The aortic valve  is tricuspid. There is mild calcification of the aortic valve. Aortic valve regurgitation is not visualized. No aortic stenosis is present.  7. The inferior vena cava is normal in size with <50% respiratory variability, suggesting right atrial pressure of 8 mmHg. FINDINGS  Left Ventricle: Left ventricular ejection fraction, by estimation, is 35%. The left ventricle has moderately decreased function. The left ventricle demonstrates global hypokinesis. The left ventricular internal cavity size was normal in size. There is no left ventricular hypertrophy. Left ventricular diastolic parameters are consistent with Grade II diastolic dysfunction (pseudonormalization). Right Ventricle: The right ventricular size is normal. No increase in right ventricular wall thickness. Right ventricular systolic function is normal. There is moderately elevated pulmonary artery systolic pressure. The tricuspid regurgitant velocity is 3.39 m/s, and with an assumed right atrial pressure of 8 mmHg, the estimated right ventricular systolic pressure is 0000000 mmHg. Left Atrium: Left atrial size was mildly dilated. Right Atrium: Right atrial size was mildly dilated. Pericardium: There is no evidence of pericardial effusion. Mitral Valve: The mitral valve is degenerative in appearance. There is mild calcification of the mitral valve leaflet(s). Moderate mitral annular calcification. Trivial mitral valve regurgitation. No evidence of mitral valve stenosis. Tricuspid Valve: The tricuspid valve is normal in structure. Tricuspid valve regurgitation is mild. Aortic Valve: The aortic valve is tricuspid. There is mild calcification of the aortic valve. Aortic valve regurgitation is not visualized. No aortic stenosis is present. Pulmonic Valve: The pulmonic valve was normal in structure. Pulmonic valve regurgitation is not visualized. Aorta: The aortic root is normal in size and structure. Venous: The inferior vena cava is normal in size with less than  50% respiratory variability, suggesting right atrial pressure of 8 mmHg. IAS/Shunts: No atrial level shunt detected by color flow Doppler.  LEFT VENTRICLE PLAX 2D LVIDd:         5.40 cm     Diastology LVIDs:         4.80 cm     LV e' medial:    4.43 cm/s LV PW:         1.00 cm     LV E/e' medial:  15.7 LV IVS:        0.70 cm     LV e' lateral:   8.85 cm/s LVOT diam:     2.10 cm     LV E/e' lateral: 7.9 LV SV:         50 LV SV Index:   28 LVOT Area:     3.46 cm  LV Volumes (MOD) LV vol d, MOD A2C: 96.4 ml LV vol d, MOD A4C: 94.1 ml LV vol s, MOD A2C: 68.5 ml LV vol s, MOD A4C: 58.6 ml LV SV MOD A2C:     27.9 ml LV SV MOD A4C:     94.1 ml LV SV MOD BP:      31.1 ml RIGHT VENTRICLE RV S prime:     13.20 cm/s TAPSE (M-mode): 2.1 cm LEFT ATRIUM  Index        RIGHT ATRIUM           Index LA diam:        3.90 cm 2.19 cm/m   RA Area:     20.40 cm LA Vol (A2C):   49.6 ml 27.87 ml/m  RA Volume:   64.40 ml  36.18 ml/m LA Vol (A4C):   49.8 ml 27.98 ml/m LA Biplane Vol: 49.9 ml 28.04 ml/m  AORTIC VALVE LVOT Vmax:   85.10 cm/s LVOT Vmean:  62.100 cm/s LVOT VTI:    0.145 m  AORTA Ao Root diam: 3.30 cm MITRAL VALVE               TRICUSPID VALVE MV Area (PHT): 4.04 cm    TR Peak grad:   46.0 mmHg MV Decel Time: 188 msec    TR Vmax:        339.00 cm/s MV E velocity: 69.50 cm/s MV A velocity: 50.70 cm/s  SHUNTS MV E/A ratio:  1.37        Systemic VTI:  0.14 m                            Systemic Diam: 2.10 cm Dalton McleanMD Electronically signed by Franki Monte Signature Date/Time: 03/06/2022/6:13:37 PM    Final         Scheduled Meds:  arformoterol  15 mcg Nebulization BID   aspirin  81 mg Oral Daily   budesonide (PULMICORT) nebulizer solution  0.5 mg Nebulization BID   chlorhexidine  15 mL Mouth Rinse BID   Chlorhexidine Gluconate Cloth  6 each Topical Q0600   guaiFENesin  600 mg Oral BID   hydrALAZINE  25 mg Oral Q8H   insulin aspart  0-15 Units Subcutaneous TID WC   insulin aspart  0-5 Units  Subcutaneous QHS   mouth rinse  15 mL Mouth Rinse q12n4p   pantoprazole (PROTONIX) IV  40 mg Intravenous QHS   predniSONE  40 mg Oral Q breakfast   revefenacin  175 mcg Nebulization Daily   sodium chloride HYPERTONIC  4 mL Nebulization TID   Continuous Infusions:  sodium chloride Stopped (03/07/22 1735)     LOS: 2 days   Time spent: 10min  Artur Winningham C Shawntel Farnworth, DO [facility]  If 7PM-7AM, please contact night-coverage [email or web address]  03/08/2022, 8:00 AM

## 2022-03-09 ENCOUNTER — Other Ambulatory Visit (HOSPITAL_COMMUNITY): Payer: Self-pay

## 2022-03-09 ENCOUNTER — Encounter (HOSPITAL_COMMUNITY)
Admission: EM | Disposition: A | Discharge status: Home / Self Care | Payer: Self-pay | Source: Home / Self Care | Attending: Internal Medicine

## 2022-03-09 DIAGNOSIS — I2510 Atherosclerotic heart disease of native coronary artery without angina pectoris: Secondary | ICD-10-CM | POA: Diagnosis not present

## 2022-03-09 DIAGNOSIS — I5021 Acute systolic (congestive) heart failure: Secondary | ICD-10-CM | POA: Diagnosis not present

## 2022-03-09 DIAGNOSIS — I10 Essential (primary) hypertension: Secondary | ICD-10-CM

## 2022-03-09 DIAGNOSIS — I5042 Chronic combined systolic (congestive) and diastolic (congestive) heart failure: Secondary | ICD-10-CM

## 2022-03-09 DIAGNOSIS — N1831 Chronic kidney disease, stage 3a: Secondary | ICD-10-CM | POA: Diagnosis not present

## 2022-03-09 DIAGNOSIS — J441 Chronic obstructive pulmonary disease with (acute) exacerbation: Secondary | ICD-10-CM

## 2022-03-09 DIAGNOSIS — I5043 Acute on chronic combined systolic (congestive) and diastolic (congestive) heart failure: Secondary | ICD-10-CM | POA: Diagnosis not present

## 2022-03-09 DIAGNOSIS — J9601 Acute respiratory failure with hypoxia: Secondary | ICD-10-CM | POA: Diagnosis not present

## 2022-03-09 DIAGNOSIS — J9602 Acute respiratory failure with hypercapnia: Secondary | ICD-10-CM | POA: Diagnosis not present

## 2022-03-09 HISTORY — PX: RIGHT/LEFT HEART CATH AND CORONARY ANGIOGRAPHY: CATH118266

## 2022-03-09 LAB — POCT I-STAT EG7
Acid-Base Excess: 2 mmol/L (ref 0.0–2.0)
Acid-Base Excess: 2 mmol/L (ref 0.0–2.0)
Bicarbonate: 30.4 mmol/L — ABNORMAL HIGH (ref 20.0–28.0)
Bicarbonate: 28.9 mmol/L — ABNORMAL HIGH (ref 20.0–28.0)
Calcium, Ion: 1.27 mmol/L (ref 1.15–1.40)
Calcium, Ion: 1.19 mmol/L (ref 1.15–1.40)
HCT: 50 % (ref 39.0–52.0)
HCT: 50 % (ref 39.0–52.0)
Hemoglobin: 17 g/dL (ref 13.0–17.0)
Hemoglobin: 17 g/dL (ref 13.0–17.0)
O2 Saturation: 54 %
O2 Saturation: 66 %
Potassium: 4.7 mmol/L (ref 3.5–5.1)
Potassium: 4.6 mmol/L (ref 3.5–5.1)
Sodium: 134 mmol/L — ABNORMAL LOW (ref 135–145)
Sodium: 135 mmol/L (ref 135–145)
TCO2: 32 mmol/L (ref 22–32)
TCO2: 31 mmol/L (ref 22–32)
pCO2, Ven: 62.4 mmHg — ABNORMAL HIGH (ref 44–60)
pCO2, Ven: 54.2 mmHg (ref 44–60)
pH, Ven: 7.295 (ref 7.25–7.43)
pH, Ven: 7.335 (ref 7.25–7.43)
pO2, Ven: 32 mmHg (ref 32–45)
pO2, Ven: 37 mmHg (ref 32–45)

## 2022-03-09 LAB — CBC
HCT: 46.8 % (ref 39.0–52.0)
Hemoglobin: 14.9 g/dL (ref 13.0–17.0)
MCH: 29.3 pg (ref 26.0–34.0)
MCHC: 31.8 g/dL (ref 30.0–36.0)
MCV: 91.9 fL (ref 80.0–100.0)
Platelets: 177 10*3/uL (ref 150–400)
RBC: 5.09 MIL/uL (ref 4.22–5.81)
RDW: 15.1 % (ref 11.5–15.5)
WBC: 13.3 10*3/uL — ABNORMAL HIGH (ref 4.0–10.5)
nRBC: 0 % (ref 0.0–0.2)

## 2022-03-09 LAB — BASIC METABOLIC PANEL
Anion gap: 5 (ref 5–15)
BUN: 21 mg/dL (ref 8–23)
CO2: 29 mmol/L (ref 22–32)
Calcium: 8.4 mg/dL — ABNORMAL LOW (ref 8.9–10.3)
Chloride: 100 mmol/L (ref 98–111)
Creatinine, Ser: 1.53 mg/dL — ABNORMAL HIGH (ref 0.61–1.24)
GFR, Estimated: 48 mL/min — ABNORMAL LOW (ref >60)
Glucose, Bld: 114 mg/dL — ABNORMAL HIGH (ref 70–99)
Potassium: 3.9 mmol/L (ref 3.5–5.1)
Sodium: 134 mmol/L — ABNORMAL LOW (ref 135–145)

## 2022-03-09 LAB — GLUCOSE, CAPILLARY
Glucose-Capillary: 129 mg/dL — ABNORMAL HIGH (ref 70–99)
Glucose-Capillary: 155 mg/dL — ABNORMAL HIGH (ref 70–99)
Glucose-Capillary: 167 mg/dL — ABNORMAL HIGH (ref 70–99)
Glucose-Capillary: 360 mg/dL — ABNORMAL HIGH (ref 70–99)

## 2022-03-09 LAB — POCT I-STAT 7, (LYTES, BLD GAS, ICA,H+H)
Acid-Base Excess: 0 mmol/L (ref 0.0–2.0)
Bicarbonate: 27.1 mmol/L (ref 20.0–28.0)
Calcium, Ion: 1.22 mmol/L (ref 1.15–1.40)
HCT: 49 % (ref 39.0–52.0)
Hemoglobin: 16.7 g/dL (ref 13.0–17.0)
O2 Saturation: 91 %
Potassium: 4.7 mmol/L (ref 3.5–5.1)
Sodium: 133 mmol/L — ABNORMAL LOW (ref 135–145)
TCO2: 29 mmol/L (ref 22–32)
pCO2 arterial: 50.8 mmHg — ABNORMAL HIGH (ref 32–48)
pH, Arterial: 7.335 — ABNORMAL LOW (ref 7.35–7.45)
pO2, Arterial: 67 mmHg — ABNORMAL LOW (ref 83–108)

## 2022-03-09 SURGERY — RIGHT/LEFT HEART CATH AND CORONARY ANGIOGRAPHY
Anesthesia: LOCAL

## 2022-03-09 MED ORDER — HEPARIN SODIUM (PORCINE) 1000 UNIT/ML IJ SOLN
INTRAMUSCULAR | Status: AC
  Filled 2022-03-09: qty 10

## 2022-03-09 MED ORDER — SODIUM CHLORIDE 0.9% FLUSH: 3.0000 mL | INTRAVENOUS | Status: DC | PRN

## 2022-03-09 MED ORDER — FENTANYL CITRATE (PF) 100 MCG/2ML IJ SOLN
INTRAMUSCULAR | Status: AC
Start: 2022-03-09 — End: ?
  Filled 2022-03-09: qty 2

## 2022-03-09 MED ORDER — LIDOCAINE HCL (PF) 1 % IJ SOLN
INTRAMUSCULAR | Status: DC | PRN
  Administered 2022-03-09: 5 mL

## 2022-03-09 MED ORDER — MIDAZOLAM HCL 2 MG/2ML IJ SOLN
INTRAMUSCULAR | Status: AC
  Filled 2022-03-09: qty 2

## 2022-03-09 MED ORDER — VERAPAMIL HCL 2.5 MG/ML IV SOLN
INTRA_ARTERIAL | Status: DC | PRN
  Administered 2022-03-09: 15 mL via INTRA_ARTERIAL

## 2022-03-09 MED ORDER — SODIUM CHLORIDE 0.9% FLUSH
3.0000 mL | Freq: Two times a day (BID) | INTRAVENOUS | Status: DC
  Administered 2022-03-09 – 2022-03-11 (×4): 3 mL via INTRAVENOUS

## 2022-03-09 MED ORDER — SODIUM CHLORIDE 0.9 % IV SOLN: 250.0000 mL | INTRAVENOUS | Status: DC | PRN

## 2022-03-09 MED ORDER — HEPARIN SODIUM (PORCINE) 1000 UNIT/ML IJ SOLN
INTRAMUSCULAR | Status: DC | PRN
  Administered 2022-03-09: 3500 [IU] via INTRAVENOUS

## 2022-03-09 MED ORDER — HEPARIN (PORCINE) IN NACL 1000-0.9 UT/500ML-% IV SOLN
INTRAVENOUS | Status: DC | PRN
  Administered 2022-03-09 (×2): 500 mL

## 2022-03-09 MED ORDER — ASPIRIN 81 MG PO CHEW: 81.0000 mg | CHEWABLE_TABLET | Freq: Every day | ORAL | Status: DC

## 2022-03-09 MED ORDER — LIDOCAINE HCL (PF) 1 % IJ SOLN
INTRAMUSCULAR | Status: AC
  Filled 2022-03-09: qty 30

## 2022-03-09 MED ORDER — ATORVASTATIN CALCIUM 40 MG PO TABS
40.0000 mg | ORAL_TABLET | Freq: Every day | ORAL | Status: DC
  Administered 2022-03-09 – 2022-03-10 (×2): 40 mg via ORAL
  Filled 2022-03-09 (×2): qty 1

## 2022-03-09 MED ORDER — FUROSEMIDE 20 MG PO TABS
20.0000 mg | ORAL_TABLET | Freq: Every day | ORAL | Status: DC
  Administered 2022-03-09 – 2022-03-11 (×3): 20 mg via ORAL
  Filled 2022-03-09 (×3): qty 1

## 2022-03-09 MED ORDER — CARVEDILOL 6.25 MG PO TABS: 6.2500 mg | ORAL_TABLET | Freq: Two times a day (BID) | ORAL | Status: DC

## 2022-03-09 MED ORDER — ACETAMINOPHEN 325 MG PO TABS: 650.0000 mg | ORAL_TABLET | ORAL | Status: DC | PRN

## 2022-03-09 MED ORDER — ALBUTEROL SULFATE (2.5 MG/3ML) 0.083% IN NEBU: 3.0000 mL | INHALATION_SOLUTION | RESPIRATORY_TRACT | Status: DC | PRN

## 2022-03-09 MED ORDER — HYDRALAZINE HCL 20 MG/ML IJ SOLN: 10.0000 mg | INTRAMUSCULAR | Status: AC | PRN

## 2022-03-09 MED ORDER — IOHEXOL 350 MG/ML SOLN
INTRAVENOUS | Status: DC | PRN
  Administered 2022-03-09: 40 mL

## 2022-03-09 MED ORDER — SODIUM CHLORIDE 0.9 % IV SOLN
INTRAVENOUS | Status: AC | PRN
  Administered 2022-03-09: 10 mL/h via INTRAVENOUS

## 2022-03-09 MED ORDER — FENTANYL CITRATE (PF) 100 MCG/2ML IJ SOLN
INTRAMUSCULAR | Status: DC | PRN
Start: 2022-03-09 — End: 2022-03-09
  Administered 2022-03-09: 25 ug via INTRAVENOUS

## 2022-03-09 MED ORDER — ASPIRIN 81 MG PO CHEW: 81.0000 mg | CHEWABLE_TABLET | ORAL | Status: AC

## 2022-03-09 MED ORDER — NITROGLYCERIN 1 MG/10 ML FOR IR/CATH LAB
INTRA_ARTERIAL | Status: AC
  Filled 2022-03-09: qty 10

## 2022-03-09 MED ORDER — ONDANSETRON HCL 4 MG/2ML IJ SOLN: 4.0000 mg | Freq: Four times a day (QID) | INTRAMUSCULAR | Status: DC | PRN

## 2022-03-09 MED ORDER — SODIUM CHLORIDE 0.9% FLUSH
3.0000 mL | Freq: Two times a day (BID) | INTRAVENOUS | Status: DC
  Administered 2022-03-09 – 2022-03-10 (×2): 3 mL via INTRAVENOUS

## 2022-03-09 MED ORDER — HEPARIN (PORCINE) IN NACL 1000-0.9 UT/500ML-% IV SOLN
INTRAVENOUS | Status: AC
  Filled 2022-03-09: qty 1000

## 2022-03-09 MED ORDER — MORPHINE SULFATE (PF) 2 MG/ML IV SOLN: 2.0000 mg | INTRAVENOUS | Status: DC | PRN

## 2022-03-09 MED ORDER — VERAPAMIL HCL 2.5 MG/ML IV SOLN
INTRAVENOUS | Status: AC
  Filled 2022-03-09: qty 2

## 2022-03-09 MED ORDER — MIDAZOLAM HCL 2 MG/2ML IJ SOLN
INTRAMUSCULAR | Status: DC | PRN
Start: 2022-03-09 — End: 2022-03-09
  Administered 2022-03-09: 1 mg via INTRAVENOUS

## 2022-03-09 MED ORDER — SODIUM CHLORIDE 0.9 % IV SOLN
INTRAVENOUS | Status: DC
Start: 2022-03-09 — End: 2022-03-09

## 2022-03-09 MED ORDER — SODIUM CHLORIDE 0.9 % IV SOLN: INTRAVENOUS | Status: AC

## 2022-03-09 MED ORDER — ISOSORBIDE MONONITRATE ER 30 MG PO TB24
30.0000 mg | ORAL_TABLET | Freq: Every day | ORAL | Status: AC
  Administered 2022-03-09 – 2022-03-11 (×3): 30 mg via ORAL
  Filled 2022-03-09 (×3): qty 1

## 2022-03-09 MED ORDER — PANTOPRAZOLE SODIUM 40 MG PO TBEC
40.0000 mg | DELAYED_RELEASE_TABLET | Freq: Every day | ORAL | Status: DC
  Administered 2022-03-09 – 2022-03-10 (×2): 40 mg via ORAL
  Filled 2022-03-09 (×2): qty 1

## 2022-03-09 MED ORDER — LABETALOL HCL 5 MG/ML IV SOLN: 10.0000 mg | INTRAVENOUS | Status: AC | PRN

## 2022-03-09 MED ORDER — UMECLIDINIUM-VILANTEROL 62.5-25 MCG/ACT IN AEPB
1.0000 | INHALATION_SPRAY | Freq: Every day | RESPIRATORY_TRACT | Status: DC
  Administered 2022-03-10 – 2022-03-11 (×2): 1 via RESPIRATORY_TRACT
  Filled 2022-03-09: qty 14

## 2022-03-09 MED ORDER — METOPROLOL TARTRATE 12.5 MG HALF TABLET
12.5000 mg | ORAL_TABLET | Freq: Two times a day (BID) | ORAL | Status: DC
  Administered 2022-03-09 – 2022-03-10 (×3): 12.5 mg via ORAL
  Filled 2022-03-09 (×3): qty 1

## 2022-03-09 SURGICAL SUPPLY — 14 items
BAND ZEPHYR COMPRESS 30 LONG (HEMOSTASIS) ×1 IMPLANT
CATH BALLN WEDGE 5F 110CM (CATHETERS) ×1 IMPLANT
CATH INFINITI JR4 5F (CATHETERS) ×1 IMPLANT
CATH OPTITORQUE TIG 4.0 5F (CATHETERS) ×1 IMPLANT
GLIDESHEATH SLEND A-KIT 6F 22G (SHEATH) ×1 IMPLANT
GUIDEWIRE INQWIRE 1.5J.035X260 (WIRE) IMPLANT
INQWIRE 1.5J .035X260CM (WIRE) ×2
KIT HEART LEFT (KITS) ×2 IMPLANT
PACK CARDIAC CATHETERIZATION (CUSTOM PROCEDURE TRAY) ×2 IMPLANT
SHEATH GLIDE SLENDER 4/5FR (SHEATH) ×1 IMPLANT
TRANSDUCER W/STOPCOCK (MISCELLANEOUS) ×2 IMPLANT
TUBING CIL FLEX 10 FLL-RA (TUBING) ×2 IMPLANT
WIRE EMERALD 3MM-J .025X260CM (WIRE) ×1 IMPLANT
WIRE HI TORQ VERSACORE-J 145CM (WIRE) ×1 IMPLANT

## 2022-03-09 NOTE — Progress Notes (Signed)
Heart Failure Stewardship Pharmacist Progress Note   PCP: Hillery Aldo, NP PCP-Cardiologist: Rollene Rotunda, MD    HPI:  71 yo M with PMH of tobacco use. He presented to the ED on 5/19 with shortness of breath. CXR without evidence of pulmonary edema but reflected dilated pulmonary arteries. CTA followed with no signs of PE, possible right heart dysfunction, interstitial pneumonitis vs interstitial edema, and a 6 mm right lower lobe nodule with smaller scattered nodules throughout the lower lung fields. An ECHO was done on 5/19 and LVEF 35% with G2DD. R/LHC on 5/22 with NICM (50% stenosis in 1st mrg) and elevated filling pressures: RA 12, PA 37, wedge 23, CO 3., CI 2.1.  Current HF Medications: Diuretic: furosemide 20 mg daily Beta Blocker: metoprolol tartrate 12.5 mg BID Other: hydralazine 25 mg TID + Imdur 30 mg daily  Prior to admission HF Medications: None  Pertinent Lab Values: Serum creatinine 1.53, BUN 21, Potassium 4.6, Sodium 135, BNP 1211.4, Magnesium 2.3, A1c 6.9   Vital Signs: Weight: 162 lbs (admission weight: 156 (likely estimated) lbs) Blood pressure: 140/100s  Heart rate: 80-90s  I/O: -2.8L yesterday; net -5.6L  Medication Assistance / Insurance Benefits Check: Does the patient have prescription insurance?  Yes Type of insurance plan: [facility] Medicare  Outpatient Pharmacy:  Prior to admission outpatient pharmacy: My Pharmacy Is the patient willing to use [facility] TOC pharmacy at discharge? Yes Is the patient willing to transition their outpatient pharmacy to utilize a [facility] outpatient pharmacy?   Pending    Assessment: 1. Acute systolic CHF (LVEF 35%), due to NICM. NYHA class II symptoms. - Consider resuming IV diuresis given RHC results today showing elevated filling pressures - Continue metoprolol tartrate 12.5 mg BID - consider transitioning to metoprolol XL prior to discharge - Consider starting Entresto 24/26 mg BID tomorrow if SCr stable - appears new  baseline around 1.5 - Consider starting spironolactone prior to discharge if SCr allows - Consider starting Farxiga 10 mg daily prior to discharge - Continue hydralazine 25 mg TID + Imdur 30 mg daily   Plan: 1) Medication changes recommended at this time: - Restart IV lasix at 40 mg daily given good response to oral - Start Entresto 24/26 mg BID  2) Patient assistance: Sherryll Burger copay $0 - Jardiance/Farxiga copay $0  3)  Education  - To be completed prior to discharge  Sharen Hones, PharmD, BCPS Heart Failure Stewardship Pharmacist Phone [phone]

## 2022-03-09 NOTE — H&P (View-Only) (Signed)
Progress Note  Patient Name: Marcus Tran Date of Encounter: 03/09/2022  [facility] Cardiologist: Minus Breeding, MD   Subjective   No chest pain or shortness of breath For right and left cardiac catheterization today No regular checkup with PCP. Unknown baseline creatinine  Inpatient Medications    Scheduled Meds:  aspirin  81 mg Oral Daily   chlorhexidine  15 mL Mouth Rinse BID   guaiFENesin  600 mg Oral BID   hydrALAZINE  25 mg Oral Q8H   insulin aspart  0-15 Units Subcutaneous TID WC   insulin aspart  0-5 Units Subcutaneous QHS   isosorbide mononitrate  30 mg Oral Daily   mouth rinse  15 mL Mouth Rinse q12n4p   metoprolol tartrate  12.5 mg Oral BID   pantoprazole  40 mg Oral QHS   predniSONE  40 mg Oral Q breakfast   sodium chloride flush  3 mL Intravenous Q12H   sodium chloride HYPERTONIC  4 mL Nebulization TID   umeclidinium-vilanterol  1 puff Inhalation Daily   Continuous Infusions:  sodium chloride Stopped (03/07/22 1735)   sodium chloride     sodium chloride     PRN Meds: sodium chloride, sodium chloride, docusate sodium, hydrALAZINE, ipratropium-albuterol, nitroGLYCERIN, ondansetron (ZOFRAN) IV, polyethylene glycol, sodium chloride flush   Vital Signs    Vitals:   03/09/22 0445 03/09/22 0750 03/09/22 0751 03/09/22 1036  BP: (!) 146/104   (!) 141/87  Pulse: 93   92  Resp: 17   18  Temp: 98 F (36.7 C)   (!) 97.5 F (36.4 C)  TempSrc: Oral   Oral  SpO2: 95% 95% 95% 95%  Weight: 73.6 kg     Height:        Intake/Output Summary (Last 24 hours) at 03/09/2022 1217 Last data filed at 03/09/2022 0600 Gross per 24 hour  Intake 300 ml  Output 1900 ml  Net -1600 ml      03/09/2022    4:45 AM 03/08/2022    4:15 AM 03/06/2022    5:00 AM  Last 3 Weights  Weight (lbs) 162 lb 3.2 oz 160 lb 4.8 oz 156 lb  Weight (kg) 73.573 kg 72.712 kg 70.761 kg      Telemetry    NSR, NSVT (5 beats) - Personally Reviewed  ECG    N/A - Personally  Reviewed  Physical Exam   GEN: No acute distress.   Neck: No JVD Cardiac: RRR, no murmurs, rubs, or gallops.  Respiratory: Clear to auscultation bilaterally. GI: Soft, nontender, non-distended  MS: No edema; No deformity. Neuro:  Nonfocal  Psych: Normal affect   Labs    High Sensitivity Troponin:   Recent Labs  Lab 03/06/22 0503 03/06/22 0737 03/06/22 1841 03/06/22 2155  TROPONINIHS 53* 46* 45* 46*     Chemistry Recent Labs  Lab 03/06/22 1841 03/07/22 0213 03/07/22 1422 03/08/22 0044 03/09/22 0654  NA 139   < > 134* 131* 134*  K 3.7   < > 4.2 4.4 3.9  CL 96*   < > 99 100 100  CO2 29   < > 27 26 29   GLUCOSE 231*   < > 130* 193* 114*  BUN 18   < > 23 25* 21  CREATININE 1.67*   < > 1.55* 1.66* 1.53*  CALCIUM 8.7*   < > 8.7* 8.2* 8.4*  MG 2.5*  --   --  2.3  --   GFRNONAA 43*   < > 48* 44*  48*  ANIONGAP 14   < > 8 5 5    < > = values in this interval not displayed.    Hematology Recent Labs  Lab 03/07/22 0213 03/08/22 0044 03/09/22 0654  WBC 16.7* 19.2* 13.3*  RBC 5.62 5.15 5.09  HGB 16.5 15.6 14.9  HCT 51.0 46.7 46.8  MCV 90.7 90.7 91.9  MCH 29.4 30.3 29.3  MCHC 32.4 33.4 31.8  RDW 14.8 15.0 15.1  PLT 179 177 177   BNP Recent Labs  Lab 03/06/22 0503  BNP 1,211.4*     Radiology    No results found.  Cardiac Studies   Echo 03/06/22.  1. Left ventricular ejection fraction, by estimation, is 35%. The left  ventricle has moderately decreased function. The left ventricle  demonstrates global hypokinesis. Left ventricular diastolic parameters are  consistent with Grade II diastolic  dysfunction (pseudonormalization).   2. Right ventricular systolic function is normal. The right ventricular  size is normal. There is moderately elevated pulmonary artery systolic  pressure. The estimated right ventricular systolic pressure is 0000000 mmHg.   3. Left atrial size was mildly dilated.   4. Right atrial size was mildly dilated.   5. The mitral valve is  degenerative. Trivial mitral valve regurgitation.  No evidence of mitral stenosis. Moderate mitral annular calcification.   6. The aortic valve is tricuspid. There is mild calcification of the  aortic valve. Aortic valve regurgitation is not visualized. No aortic  stenosis is present.   7. The inferior vena cava is normal in size with <50% respiratory  variability, suggesting right atrial pressure of 8 mmHg.   Patient Profile     71 y.o. male with prior history of tobacco smoking, no regular follow-up who admitted for acute hypoxic respiratory failure with suspected underlying COPD and heart failure exacerbation.  Assessment & Plan    1.  Acute systolic and diastolic congestive heart failure -Echocardiogram showed LV function of [id number], grade 2 diastolic dysfunction.  RVSP 54 mmHg. -Only got IV Lasix 40 mg x 1 during admission.  Net I&O -5 L.  However, weight is up.  Doubt accurate.  He is getting breathing treatment.  Able to lay flat. -Plan for right and left cardiac catheterization today -Continue hydralazine/Imdur -Will add beta-blocker (post cath) -SGLT2 inhibitor post cath -Likely defer Entresto given CKD however seems baseline creatinine of 1.5-1.6  The patient understands that risks include but are not limited to stroke (1 in 1000), death (1 in 1000), kidney failure [usually temporary] (1 in 500), bleeding (1 in 200), allergic reaction [possibly serious] (1 in 200), and agrees to proceed.     2.  Chronic kidney disease stage III -No prior readings -Currently creatinine stable at 1.5-1.6  3.  Hypertension -Blood pressure improving - Add BB post cath   Otherwise per primary team    For questions or updates, please contact [place] Please consult [email or web address] for contact info under        Signed, Larae Grooms, MD  03/09/2022, 12:17 PM     I have examined the patient and reviewed assessment and plan and discussed with patient.  Agree with above as stated.     All questions about cath answered.  Decreased EF.  Plan for r/l heart cath.  Cr lower this AM.  Titrating heart failure meds as allowed by renal function.   Larae Grooms

## 2022-03-09 NOTE — Progress Notes (Signed)
NAME:  Marcus Tran, MRN:  [id number], DOB:  April 20, 1951, LOS: 3 ADMISSION DATE:  03/06/2022, CONSULTATION DATE:  03/06/22 REFERRING MD:  Court Joy - IMTS, CHIEF COMPLAINT:  Acute on chronic hypoxic and hypercarbic respiratory failure    History of Present Illness:  71 yo M without much known PMH other than tobacco use and prior Rx of bronchodilators (but per pt no formally dx lung disease) presented to [facility] 5/19 with CC SOB. EMS was called and found pt hypoxic SpO2 60s -- was given nebs x4, 2 g mag, 125mg  solumedrol. Placed on NRB In ED pt was placed on BiPAP. VBG on BiPAP 7.26 / 73.7/ 87 -- with subsequent ABG 7.297 / 59/68 and FiO2 incr to 100%  IMTS initially called for admission however with lack of significant improvement in clinical picture, and increasing O2 req, PCCM consulted for admission.   Prior to PCCM arrival, IMTS has ordered steroids and nebs.   Labs at time of consultation most significant for BNP 1200 hs trop 54  CTA neg for PE but is suggestive of fluid overload   Pertinent  Medical History  Tobacco use Possible obstructive lung disease   Significant Hospital Events: Including procedures, antibiotic start and stop dates in addition to other pertinent events   5/19 arrives to ED SOB and hypoxia -- BiPAP for hypoxic/hypercarbic resp failure. Lack of improvement --> PCCM consult  5/20 weaned off BiPAP yesterday, on nasal cannula, coughing with secretions, seems to be feeling better.  Transferred out of ICU.  Interim History / Subjective:  Mr. Bansal denies SOB. His main complaint is being hungry. He has a heart cath today.  Objective   Blood pressure (!) 146/104, pulse 93, temperature 98 F (36.7 C), temperature source Oral, resp. rate 17, height 5\' 5"  (1.651 m), weight 73.6 kg, SpO2 95 %.        Intake/Output Summary (Last 24 hours) at 03/09/2022 0741 Last data filed at 03/09/2022 0600 Gross per 24 hour  Intake 540 ml  Output 3250 ml  Net -2710 ml    Filed Weights    03/06/22 0500 03/08/22 0415 03/09/22 0445  Weight: 70.8 kg 72.7 kg 73.6 kg    Examination: General: chronically ill appearing man lying in bed in NAD HENT: [place]/AT, eyes anicteric Lungs: no wheezing, breathing comfortably on [place], no conversational dyspnea or accessory muscle use Cardiovascular: S1S2, RRR Abdomen: soft, NT Neuro: awake and alert, repositioning in bed, answering questions appropriately  Blood cultures> NGTD RVP negative  Resolved Hospital Problem list   Acute encephalopathy  Assessment & Plan:   Acute respiratory failure with hypoxia and hypercarbia Suspected underlying COPD; 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 Will need to be walked before discharge to ensure no home oxygen needs. -hypertonic saline nebs, mucinex -will need follow up CT in 62mo for nodules, office follow up requested with Dr. Silas Flood  Suspected acute heart failure, with right heart strain, reduced EF on TTE CAD -RV/LV > 1 on CTA, neg for PE. Mild cardiomegaly  -  BNP 1200, troponin elevation P -L&RHC today -diuresis per cardiology  HTN Per primary  CKD stage IIIa-stable -Continue to monitor, renally dose medications as needed  PCCM will be available as needed.  He appears improved since leaving ICU.  Outpatient follow-up has been requested.  Best Practice (right click and "Reselect all SmartList Selections" daily)   Per primary  Labs   CBC: Recent Labs  Lab 03/06/22 0503 03/06/22 0543  03/06/22 0739 03/06/22 0832 03/07/22 0213 03/08/22 0044  WBC 8.0  --   --   --  16.7* 19.2*  NEUTROABS 4.1  --   --   --   --   --   HGB 18.6* 20.4* 19.0* 18.7* 16.5 15.6  HCT 57.4* 60.0* 56.0* 55.0* 51.0 46.7  MCV 92.4  --   --   --  90.7 90.7  PLT 179  --   --   --  179 177     Basic Metabolic Panel: Recent Labs  Lab 03/06/22 0503 03/06/22 0543 03/06/22 0832 03/06/22 1841 03/07/22 0213 03/07/22 1422 03/08/22 0044  NA 136   < > 136 139 134* 134* 131*  K 3.9   < > 3.4* 3.7 3.7 4.2 4.4  CL 96*  --   --  96* 98 99 100  CO2 29  --   --  29 25 27 26   GLUCOSE 150*  --   --  231* 171* 130* 193*  BUN 22  --   --  18 21 23  25*  CREATININE 1.57*  --   --  1.67* 1.51* 1.55* 1.66*  CALCIUM 9.1  --   --  8.7* 8.6* 8.7* 8.2*  MG  --   --   --  2.5*  --   --  2.3  PHOS  --   --   --  3.7  --   --   --    < > = values in this interval not displayed.    Julian Hy, DO 03/09/22 8:57 AM [facility]

## 2022-03-09 NOTE — Progress Notes (Addendum)
Progress Note  Patient Name: Marcus Tran Date of Encounter: 03/09/2022  [facility] Cardiologist: Minus Breeding, MD   Subjective   No chest pain or shortness of breath For right and left cardiac catheterization today No regular checkup with PCP. Unknown baseline creatinine  Inpatient Medications    Scheduled Meds:  aspirin  81 mg Oral Daily   chlorhexidine  15 mL Mouth Rinse BID   guaiFENesin  600 mg Oral BID   hydrALAZINE  25 mg Oral Q8H   insulin aspart  0-15 Units Subcutaneous TID WC   insulin aspart  0-5 Units Subcutaneous QHS   isosorbide mononitrate  30 mg Oral Daily   mouth rinse  15 mL Mouth Rinse q12n4p   metoprolol tartrate  12.5 mg Oral BID   pantoprazole  40 mg Oral QHS   predniSONE  40 mg Oral Q breakfast   sodium chloride flush  3 mL Intravenous Q12H   sodium chloride HYPERTONIC  4 mL Nebulization TID   umeclidinium-vilanterol  1 puff Inhalation Daily   Continuous Infusions:  sodium chloride Stopped (03/07/22 1735)   sodium chloride     sodium chloride     PRN Meds: sodium chloride, sodium chloride, docusate sodium, hydrALAZINE, ipratropium-albuterol, nitroGLYCERIN, ondansetron (ZOFRAN) IV, polyethylene glycol, sodium chloride flush   Vital Signs    Vitals:   03/09/22 0445 03/09/22 0750 03/09/22 0751 03/09/22 1036  BP: (!) 146/104   (!) 141/87  Pulse: 93   92  Resp: 17   18  Temp: 98 F (36.7 C)   (!) 97.5 F (36.4 C)  TempSrc: Oral   Oral  SpO2: 95% 95% 95% 95%  Weight: 73.6 kg     Height:        Intake/Output Summary (Last 24 hours) at 03/09/2022 1217 Last data filed at 03/09/2022 0600 Gross per 24 hour  Intake 300 ml  Output 1900 ml  Net -1600 ml      03/09/2022    4:45 AM 03/08/2022    4:15 AM 03/06/2022    5:00 AM  Last 3 Weights  Weight (lbs) 162 lb 3.2 oz 160 lb 4.8 oz 156 lb  Weight (kg) 73.573 kg 72.712 kg 70.761 kg      Telemetry    NSR, NSVT (5 beats) - Personally Reviewed  ECG    N/A - Personally  Reviewed  Physical Exam   GEN: No acute distress.   Neck: No JVD Cardiac: RRR, no murmurs, rubs, or gallops.  Respiratory: Clear to auscultation bilaterally. GI: Soft, nontender, non-distended  MS: No edema; No deformity. Neuro:  Nonfocal  Psych: Normal affect   Labs    High Sensitivity Troponin:   Recent Labs  Lab 03/06/22 0503 03/06/22 0737 03/06/22 1841 03/06/22 2155  TROPONINIHS 53* 46* 45* 46*     Chemistry Recent Labs  Lab 03/06/22 1841 03/07/22 0213 03/07/22 1422 03/08/22 0044 03/09/22 0654  NA 139   < > 134* 131* 134*  K 3.7   < > 4.2 4.4 3.9  CL 96*   < > 99 100 100  CO2 29   < > 27 26 29   GLUCOSE 231*   < > 130* 193* 114*  BUN 18   < > 23 25* 21  CREATININE 1.67*   < > 1.55* 1.66* 1.53*  CALCIUM 8.7*   < > 8.7* 8.2* 8.4*  MG 2.5*  --   --  2.3  --   GFRNONAA 43*   < > 48* 44*  48*  ANIONGAP 14   < > 8 5 5    < > = values in this interval not displayed.    Hematology Recent Labs  Lab 03/07/22 0213 03/08/22 0044 03/09/22 0654  WBC 16.7* 19.2* 13.3*  RBC 5.62 5.15 5.09  HGB 16.5 15.6 14.9  HCT 51.0 46.7 46.8  MCV 90.7 90.7 91.9  MCH 29.4 30.3 29.3  MCHC 32.4 33.4 31.8  RDW 14.8 15.0 15.1  PLT 179 177 177   BNP Recent Labs  Lab 03/06/22 0503  BNP 1,211.4*     Radiology    No results found.  Cardiac Studies   Echo 03/06/22.  1. Left ventricular ejection fraction, by estimation, is 35%. The left  ventricle has moderately decreased function. The left ventricle  demonstrates global hypokinesis. Left ventricular diastolic parameters are  consistent with Grade II diastolic  dysfunction (pseudonormalization).   2. Right ventricular systolic function is normal. The right ventricular  size is normal. There is moderately elevated pulmonary artery systolic  pressure. The estimated right ventricular systolic pressure is 0000000 mmHg.   3. Left atrial size was mildly dilated.   4. Right atrial size was mildly dilated.   5. The mitral valve is  degenerative. Trivial mitral valve regurgitation.  No evidence of mitral stenosis. Moderate mitral annular calcification.   6. The aortic valve is tricuspid. There is mild calcification of the  aortic valve. Aortic valve regurgitation is not visualized. No aortic  stenosis is present.   7. The inferior vena cava is normal in size with <50% respiratory  variability, suggesting right atrial pressure of 8 mmHg.   Patient Profile     71 y.o. male with prior history of tobacco smoking, no regular follow-up who admitted for acute hypoxic respiratory failure with suspected underlying COPD and heart failure exacerbation.  Assessment & Plan    1.  Acute systolic and diastolic congestive heart failure -Echocardiogram showed LV function of [id number], grade 2 diastolic dysfunction.  RVSP 54 mmHg. -Only got IV Lasix 40 mg x 1 during admission.  Net I&O -5 L.  However, weight is up.  Doubt accurate.  He is getting breathing treatment.  Able to lay flat. -Plan for right and left cardiac catheterization today -Continue hydralazine/Imdur -Will add beta-blocker (post cath) -SGLT2 inhibitor post cath -Likely defer Entresto given CKD however seems baseline creatinine of 1.5-1.6  The patient understands that risks include but are not limited to stroke (1 in 1000), death (1 in 1000), kidney failure [usually temporary] (1 in 500), bleeding (1 in 200), allergic reaction [possibly serious] (1 in 200), and agrees to proceed.     2.  Chronic kidney disease stage III -No prior readings -Currently creatinine stable at 1.5-1.6  3.  Hypertension -Blood pressure improving - Add BB post cath   Otherwise per primary team    For questions or updates, please contact [place] Please consult [email or web address] for contact info under        Signed, Larae Grooms, MD  03/09/2022, 12:17 PM     I have examined the patient and reviewed assessment and plan and discussed with patient.  Agree with above as stated.     All questions about cath answered.  Decreased EF.  Plan for r/l heart cath.  Cr lower this AM.  Titrating heart failure meds as allowed by renal function.   Larae Grooms

## 2022-03-09 NOTE — Progress Notes (Signed)
Heart Failure Navigator Progress Note  Following this hospitalization to assess for HV TOC readiness.   Right / Left Heart Cath 03/09/22  Rhae Hammock, BSN, RN Heart Failure Nurse Navigator Secure Chat Only

## 2022-03-09 NOTE — Interval H&P Note (Signed)
Cath Lab Visit (complete for each Cath Lab visit)  Clinical Evaluation Leading to the Procedure:   ACS: No.  Non-ACS:    Anginal Classification: CCS I  Anti-ischemic medical therapy: Minimal Therapy (1 class of medications)  Non-Invasive Test Results: No non-invasive testing performed  Prior CABG: No previous CABG      History and Physical Interval Note:  03/09/2022 3:58 PM  Marcus Tran  has presented today for surgery, with the diagnosis of HF.  The various methods of treatment have been discussed with the patient and family. After consideration of risks, benefits and other options for treatment, the patient has consented to  Procedure(s): RIGHT/LEFT HEART CATH AND CORONARY ANGIOGRAPHY (N/A) as a surgical intervention.  The patient's history has been reviewed, patient examined, no change in status, stable for surgery.  I have reviewed the patient's chart and labs.  Questions were answered to the patient's satisfaction.     Nanetta Batty

## 2022-03-09 NOTE — TOC Benefit Eligibility Note (Signed)
Patient Advocate Encounter ? ?Insurance verification completed.   ? ?The patient is currently admitted and upon discharge could be taking Entresto 24-26 mg. ? ?The current 30 day co-pay is, $0.00.  ? ?The patient is currently admitted and upon discharge could be taking Farxiga 10 mg. ? ?The current 30 day co-pay is, $0.00.  ? ?The patient is currently admitted and upon discharge could be taking Jardiance 10 mg. ? ?The current 30 day co-pay is, $0.00.  ? ? ? ?The patient is insured through [employer] Medicare Part D  ? ? ? ?Marcus Tran, CPhT ?Pharmacy Patient Advocate Specialist ?[facility] Patient Advocate Team ?Direct Number: [phone]  Fax: [phone] ? ? ? ? ? ?  ?

## 2022-03-09 NOTE — Progress Notes (Signed)
PROGRESS NOTE    Marcus Tran  [id number] DOB: 07-07-1951 DOA: 03/06/2022 PCP: Cipriano Mile, NP   Brief Narrative:  71 yo M with limited past medical history - known tobacco use/abuse and prior use of bronchodilators but no formal diagnosis of asthma/COPD. Patient presented to with worsening shortness of breath and dyspnea with exertion - hypoxic into 60s in the field -improved with steroids, nebs, and NRB ultimately requiring Bipap at intake. CTA neg for PE but is suggestive of fluid overload.   5/19 admitted requiring BiPAP for hypoxic/hypercarbic resp failure; admitted to ICU under PCCM care 5/20 weaned off BiPAP to nasal cannula with markedly improving symptoms 5/21 continues to wean oxygen as appropriate, symptoms markedly improving 5/22 Cardiac cath planned today   Assessment & Plan:   Principal Problem:   Acute respiratory failure with hypoxia and hypercapnia (HCC) Active Problems:   Acute metabolic encephalopathy   Tobacco abuse   Bilateral hilar adenopathy syndrome   HTN (hypertension)   Stage 3a chronic kidney disease (CKD) (HCC)   Coronary artery disease  Acute metabolic encephalopathy due to hypoxia/hypercarbia, POA -Resolving with treatment as below  Acute respiratory failure with hypoxia and hypercarbia Suspected underlying COPD / emphysema on CT scan Hx tobacco abuse  Incidental finding: bilateral hilar lymph nodes 1.2cm on R 1.1 cm on L, subcarinal and precarinal mediastinal nodes, 87mm posterior basal RLL nodule, scattered small subpleural nodules  -CTA neg for PE, no evidence of pneumonia -COVID, flu a/b neg -Continue steroids x5 days per PCCM recommendations -Wean oxygen as tolerated, BiPAP as needed(not required in over 24 hours) -Outpatient CT in 27mo for incidentally noted pulmonary nodules -pulmonology to follow  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% -Elevated BNP/troponin -Status post Lasix x1 with appropriate  output -Cardiology following, appreciate insight and recommendations -Planned heart cath 03/10/2022   HTN Continue hydralazine, isosorbide -Pending cardiac findings may benefit from ACE/ARB versus Entresto given EF of 35%   CAD -Plan left heart catheterization later this admission cardiology consultation   CKD stage IIIa -no prior Cr in EMR -- admitting Cr with 1.57 -Stable, likely at baseline  DVT prophylaxis: Heparin held perioperatively Code Status: Full Family Communication: None present  Status is: Inpatient  Dispo: The patient is from: Home              Anticipated d/c is to: Home - no recs per PT              Anticipated d/c date is: 24 to 48 hours              Patient currently not medically stable for discharge  Consultants:  Cardiology, PCCM  Procedures:  Cardiac cath 03/10/2022  Antimicrobials:  None indicated  Subjective: No acute issues or events overnight denies nausea vomiting diarrhea constipation headache fevers chills or chest pain  Objective: Vitals:   03/08/22 1630 03/08/22 2030 03/08/22 2043 03/09/22 0445  BP: 122/83 (!) 158/104  (!) 146/104  Pulse:  (!) 102  93  Resp: 18 18  17   Temp: (!) 97.5 F (36.4 C) 97.6 F (36.4 C)  98 F (36.7 C)  TempSrc: Oral Oral  Oral  SpO2:  90% 93% 95%  Weight:    73.6 kg  Height:        Intake/Output Summary (Last 24 hours) at 03/09/2022 0734 Last data filed at 03/09/2022 0600 Gross per 24 hour  Intake 540 ml  Output 3250 ml  Net -2710 ml  Filed Weights   03/06/22 0500 03/08/22 0415 03/09/22 0445  Weight: 70.8 kg 72.7 kg 73.6 kg    Examination:  General exam: Appears calm and comfortable  Respiratory system: Clear to auscultation. Respiratory effort normal. Cardiovascular system: S1 & S2 heard, RRR. No JVD, murmurs, rubs, gallops or clicks. No pedal edema. Gastrointestinal system: Abdomen is nondistended, soft and nontender. No organomegaly or masses felt. Normal bowel sounds heard. Central  nervous system: Alert and oriented. No focal neurological deficits. Extremities: Symmetric 5 x 5 power. Skin: No rashes, lesions or ulcers Psychiatry: Judgement and insight appear normal. Mood & affect appropriate.     Data Reviewed: I have personally reviewed following labs and imaging studies  CBC: Recent Labs  Lab 03/06/22 0503 03/06/22 0543 03/06/22 0739 03/06/22 0832 03/07/22 0213 03/08/22 0044  WBC 8.0  --   --   --  16.7* 19.2*  NEUTROABS 4.1  --   --   --   --   --   HGB 18.6* 20.4* 19.0* 18.7* 16.5 15.6  HCT 57.4* 60.0* 56.0* 55.0* 51.0 46.7  MCV 92.4  --   --   --  90.7 90.7  PLT 179  --   --   --  179 [id number]    Basic Metabolic Panel: Recent Labs  Lab 03/06/22 0503 03/06/22 0543 03/06/22 0832 03/06/22 1841 03/07/22 0213 03/07/22 1422 03/08/22 0044  NA 136   < > 136 139 134* 134* 131*  K 3.9   < > 3.4* 3.7 3.7 4.2 4.4  CL 96*  --   --  96* 98 99 100  CO2 29  --   --  29 25 27 26   GLUCOSE 150*  --   --  231* 171* 130* 193*  BUN 22  --   --  18 21 23  25*  CREATININE 1.57*  --   --  1.67* 1.51* 1.55* 1.66*  CALCIUM 9.1  --   --  8.7* 8.6* 8.7* 8.2*  MG  --   --   --  2.5*  --   --  2.3  PHOS  --   --   --  3.7  --   --   --    < > = values in this interval not displayed.    GFR: Estimated Creatinine Clearance: 35.5 mL/min (A) (by C-G formula based on SCr of 1.66 mg/dL (H)). Liver Function Tests: No results for input(s): AST, ALT, ALKPHOS, BILITOT, PROT, ALBUMIN in the last 168 hours. No results for input(s): LIPASE, AMYLASE in the last 168 hours. No results for input(s): AMMONIA in the last 168 hours. Coagulation Profile: No results for input(s): INR, PROTIME in the last 168 hours. Cardiac Enzymes: No results for input(s): CKTOTAL, CKMB, CKMBINDEX, TROPONINI in the last 168 hours. BNP (last 3 results) No results for input(s): PROBNP in the last 8760 hours. HbA1C: Recent Labs    03/07/22 1422  HGBA1C 6.9*    CBG: Recent Labs  Lab 03/07/22 2109  03/08/22 0757 03/08/22 1145 03/08/22 1653 03/08/22 2111  GLUCAP 215* 135* 185* 229* 272*    Lipid Profile: No results for input(s): CHOL, HDL, LDLCALC, TRIG, CHOLHDL, LDLDIRECT in the last 72 hours. Thyroid Function Tests: No results for input(s): TSH, T4TOTAL, FREET4, T3FREE, THYROIDAB in the last 72 hours. Anemia Panel: No results for input(s): VITAMINB12, FOLATE, FERRITIN, TIBC, IRON, RETICCTPCT in the last 72 hours. Sepsis Labs: Recent Labs  Lab 03/06/22 1121  PROCALCITON <0.10     Recent Results (  from the past 240 hour(s))  Resp Panel by RT-PCR (Flu A&B, Covid) Nasopharyngeal Swab     Status: None   Collection Time: 03/06/22  5:22 AM   Specimen: Nasopharyngeal Swab; Nasopharyngeal(NP) swabs in vial transport medium  Result Value Ref Range Status   SARS Coronavirus 2 by RT PCR NEGATIVE NEGATIVE Final    Comment: (NOTE) SARS-CoV-2 target nucleic acids are NOT DETECTED.  The SARS-CoV-2 RNA is generally detectable in upper respiratory specimens during the acute phase of infection. The lowest concentration of SARS-CoV-2 viral copies this assay can detect is 138 copies/mL. A negative result does not preclude SARS-Cov-2 infection and should not be used as the sole basis for treatment or other patient management decisions. A negative result may occur with  improper specimen collection/handling, submission of specimen other than nasopharyngeal swab, presence of viral mutation(s) within the areas targeted by this assay, and inadequate number of viral copies(<138 copies/mL). A negative result must be combined with clinical observations, patient history, and epidemiological information. The expected result is Negative.  Fact Sheet for Patients:  EntrepreneurPulse.com.au  Fact Sheet for Healthcare Providers:  IncredibleEmployment.be  This test is no t yet approved or cleared by the [place] FDA and  has been authorized for detection  and/or diagnosis of SARS-CoV-2 by FDA under an Emergency Use Authorization (EUA). This EUA will remain  in effect (meaning this test can be used) for the duration of the COVID-19 declaration under Section 564(b)(1) of the Act, 21 U.S.C.section 360bbb-3(b)(1), unless the authorization is terminated  or revoked sooner.       Influenza A by PCR NEGATIVE NEGATIVE Final   Influenza B by PCR NEGATIVE NEGATIVE Final    Comment: (NOTE) The Xpert Xpress SARS-CoV-2/FLU/RSV plus assay is intended as an aid in the diagnosis of influenza from Nasopharyngeal swab specimens and should not be used as a sole basis for treatment. Nasal washings and aspirates are unacceptable for Xpert Xpress SARS-CoV-2/FLU/RSV testing.  Fact Sheet for Patients: EntrepreneurPulse.com.au  Fact Sheet for Healthcare Providers: IncredibleEmployment.be  This test is not yet approved or cleared by the [place] FDA and has been authorized for detection and/or diagnosis of SARS-CoV-2 by FDA under an Emergency Use Authorization (EUA). This EUA will remain in effect (meaning this test can be used) for the duration of the COVID-19 declaration under Section 564(b)(1) of the Act, 21 U.S.C. section 360bbb-3(b)(1), unless the authorization is terminated or revoked.  Performed at [facility], [place] [address] [place], [place] [address]   Culture, blood (Routine X 2) w Reflex to ID Panel     Status: None (Preliminary result)   Collection Time: 03/06/22  8:28 AM   Specimen: BLOOD  Result Value Ref Range Status   Specimen Description BLOOD SITE NOT SPECIFIED  Final   Special Requests   Final    BOTTLES DRAWN AEROBIC AND ANAEROBIC Blood Culture adequate volume   Culture   Final    NO GROWTH 3 DAYS Performed at [facility], [address] [place], [place] [address]    Report Status PENDING  Incomplete  Respiratory (~20 pathogens) panel by PCR     Status: None   Collection  Time: 03/06/22  8:39 AM   Specimen: Nasopharyngeal Swab; Respiratory  Result Value Ref Range Status   Adenovirus NOT DETECTED NOT DETECTED Final   Coronavirus 229E NOT DETECTED NOT DETECTED Final    Comment: (NOTE) The Coronavirus on the Respiratory Panel, DOES NOT test for the novel  Coronavirus (2019 nCoV)    Coronavirus HKU1 NOT DETECTED NOT DETECTED Final   Coronavirus NL63 NOT DETECTED NOT DETECTED Final   Coronavirus OC43 NOT DETECTED NOT DETECTED Final   Metapneumovirus NOT DETECTED NOT DETECTED Final   Rhinovirus / Enterovirus NOT DETECTED NOT DETECTED Final   Influenza A NOT DETECTED NOT DETECTED Final   Influenza B NOT DETECTED NOT DETECTED Final   Parainfluenza Virus 1 NOT DETECTED NOT DETECTED Final   Parainfluenza Virus 2 NOT DETECTED NOT DETECTED Final   Parainfluenza Virus 3 NOT DETECTED NOT DETECTED Final   Parainfluenza Virus 4 NOT DETECTED NOT DETECTED Final   Respiratory Syncytial Virus NOT DETECTED NOT DETECTED Final   Bordetella pertussis NOT DETECTED NOT DETECTED Final   Bordetella Parapertussis NOT DETECTED NOT DETECTED Final   Chlamydophila pneumoniae NOT DETECTED NOT DETECTED Final   Mycoplasma pneumoniae NOT DETECTED NOT DETECTED Final    Comment: Performed at [facility], [place] [address], [place], [place] [address]  Culture, blood (Routine X 2) w Reflex to ID Panel     Status: None (Preliminary result)   Collection Time: 03/06/22  9:02 AM   Specimen: BLOOD  Result Value Ref Range Status   Specimen Description BLOOD SITE NOT SPECIFIED  Final   Special Requests   Final    BOTTLES DRAWN AEROBIC AND ANAEROBIC Blood Culture results may not be optimal due to an inadequate volume of blood received in culture bottles   Culture   Final    NO GROWTH 3 DAYS Performed at [facility], [place] [address] [place], [place] [address]    Report Status PENDING  Incomplete  MRSA Next Gen by PCR, Nasal     Status: None   Collection Time: 03/06/22 10:33 AM    Specimen: Nasal Mucosa; Nasal Swab  Result Value Ref Range Status   MRSA by PCR Next Gen NOT DETECTED NOT DETECTED Final    Comment: (NOTE) The GeneXpert MRSA Assay (FDA approved for NASAL specimens only), is one component of a comprehensive MRSA colonization surveillance program. It is not intended to diagnose MRSA infection nor to guide or monitor treatment for MRSA infections. Test performance is not FDA approved in patients less than 87 years old. Performed at [facility], [place] [address], [place], [place] [address]           Radiology Studies: No results found.      Scheduled Meds:  arformoterol  15 mcg Nebulization BID   aspirin  81 mg Oral Daily   budesonide (PULMICORT) nebulizer solution  0.5 mg Nebulization BID   chlorhexidine  15 mL Mouth Rinse BID   guaiFENesin  600 mg Oral BID   hydrALAZINE  25 mg Oral Q8H   insulin aspart  0-15 Units Subcutaneous TID WC   insulin aspart  0-5 Units Subcutaneous QHS   isosorbide dinitrate  20 mg Oral BID   mouth rinse  15 mL Mouth Rinse q12n4p   pantoprazole (PROTONIX) IV  40 mg Intravenous QHS   predniSONE  40 mg Oral Q breakfast   revefenacin  175 mcg Nebulization Daily   sodium chloride HYPERTONIC  4 mL Nebulization TID   Continuous Infusions:  sodium chloride Stopped (03/07/22 1735)     LOS: 3 days   Time spent: 58min  Marcus Marinello C Ketina Mars, DO [facility]  If 7PM-7AM, please contact night-coverage [email or web address]  03/09/2022, 7:34 AM

## 2022-03-09 NOTE — Telephone Encounter (Signed)
Patient scheduled for 04/06/2022 at 11am with Dr. Judeth Horn. Appointment reminder mailed- nothing further needed.

## 2022-03-10 ENCOUNTER — Encounter (HOSPITAL_COMMUNITY): Payer: Self-pay | Admitting: Cardiovascular Disease

## 2022-03-10 DIAGNOSIS — I10 Essential (primary) hypertension: Secondary | ICD-10-CM | POA: Diagnosis not present

## 2022-03-10 DIAGNOSIS — Z720 Tobacco use: Secondary | ICD-10-CM | POA: Diagnosis not present

## 2022-03-10 DIAGNOSIS — I5043 Acute on chronic combined systolic (congestive) and diastolic (congestive) heart failure: Secondary | ICD-10-CM

## 2022-03-10 DIAGNOSIS — N1831 Chronic kidney disease, stage 3a: Secondary | ICD-10-CM | POA: Diagnosis not present

## 2022-03-10 LAB — BASIC METABOLIC PANEL
Anion gap: 8 (ref 5–15)
BUN: 27 mg/dL — ABNORMAL HIGH (ref 8–23)
CO2: 27 mmol/L (ref 22–32)
Calcium: 8.3 mg/dL — ABNORMAL LOW (ref 8.9–10.3)
Chloride: 98 mmol/L (ref 98–111)
Creatinine, Ser: 1.28 mg/dL — ABNORMAL HIGH (ref 0.61–1.24)
GFR, Estimated: 60 mL/min — ABNORMAL LOW (ref >60)
Glucose, Bld: 144 mg/dL — ABNORMAL HIGH (ref 70–99)
Potassium: 4.6 mmol/L (ref 3.5–5.1)
Sodium: 133 mmol/L — ABNORMAL LOW (ref 135–145)

## 2022-03-10 LAB — CBC
HCT: 45.3 % (ref 39.0–52.0)
Hemoglobin: 15.1 g/dL (ref 13.0–17.0)
MCH: 30 pg (ref 26.0–34.0)
MCHC: 33.3 g/dL (ref 30.0–36.0)
MCV: 89.9 fL (ref 80.0–100.0)
Platelets: 174 10*3/uL (ref 150–400)
RBC: 5.04 MIL/uL (ref 4.22–5.81)
RDW: 15.3 % (ref 11.5–15.5)
WBC: 11.1 10*3/uL — ABNORMAL HIGH (ref 4.0–10.5)
nRBC: 0 % (ref 0.0–0.2)

## 2022-03-10 LAB — GLUCOSE, CAPILLARY
Glucose-Capillary: 132 mg/dL — ABNORMAL HIGH (ref 70–99)
Glucose-Capillary: 160 mg/dL — ABNORMAL HIGH (ref 70–99)
Glucose-Capillary: 220 mg/dL — ABNORMAL HIGH (ref 70–99)
Glucose-Capillary: 228 mg/dL — ABNORMAL HIGH (ref 70–99)

## 2022-03-10 LAB — MAGNESIUM: Magnesium: 2 mg/dL (ref 1.7–2.4)

## 2022-03-10 MED ORDER — METOPROLOL SUCCINATE ER 25 MG PO TB24
25.0000 mg | ORAL_TABLET | Freq: Every day | ORAL | Status: DC
  Administered 2022-03-11: 25 mg via ORAL
  Filled 2022-03-10: qty 1

## 2022-03-10 MED ORDER — DAPAGLIFLOZIN PROPANEDIOL 10 MG PO TABS
10.0000 mg | ORAL_TABLET | Freq: Every day | ORAL | Status: DC
Start: 2022-03-11 — End: 2022-03-11
  Administered 2022-03-11: 10 mg via ORAL
  Filled 2022-03-10: qty 1

## 2022-03-10 MED ORDER — DAPAGLIFLOZIN PROPANEDIOL 10 MG PO TABS: 10.0000 mg | ORAL_TABLET | Freq: Every day | ORAL | Status: DC

## 2022-03-10 MED ORDER — SACUBITRIL-VALSARTAN 24-26 MG PO TABS
1.0000 | ORAL_TABLET | Freq: Two times a day (BID) | ORAL | Status: DC
  Administered 2022-03-10 – 2022-03-11 (×3): 1 via ORAL
  Filled 2022-03-10 (×3): qty 1

## 2022-03-10 MED ORDER — METOPROLOL TARTRATE 12.5 MG HALF TABLET
12.5000 mg | ORAL_TABLET | Freq: Two times a day (BID) | ORAL | Status: AC
  Administered 2022-03-10: 12.5 mg via ORAL
  Filled 2022-03-10: qty 1

## 2022-03-10 MED ORDER — FUROSEMIDE 10 MG/ML IJ SOLN
40.0000 mg | Freq: Once | INTRAMUSCULAR | Status: AC
  Administered 2022-03-10: 40 mg via INTRAVENOUS
  Filled 2022-03-10: qty 4

## 2022-03-10 NOTE — Progress Notes (Signed)
Heart Failure Stewardship Pharmacist Progress Note   PCP: Hillery Aldo, NP PCP-Cardiologist: Rollene Rotunda, MD    HPI:  71 yo M with PMH of tobacco use.  He presented to the ED on 05/19 with shortness of breath and hypoxia on RA (O2 67%) per EMS.  CXR without evidence of pulmonary edema but reflected dilated pulmonary arteries.  CTA followed with no signs of PE, possible right heart dysfunction, interstitial pneumonitis vs interstitial edema, and a 6 mm right lower lobe nodule with smaller scattered nodules throughout the lower lung fields.  An ECHO was done on 05/19 showing LVEF 35% with G2DD.  R/LHC on 5/22 with NICM (50% stenosis in 1st mrg) and elevated filling pressures - RA 12, PA 37, wedge 23; reduced cardiac output - CO 3., CI 2.1.  Current HF Medications: Diuretic: furosemide PO 20 mg daily Beta Blocker: metoprolol succinate 25 mg daily (starting 05/24)  SGLT2i: Farxiga 10 mg daily Other: hydralazine 25 mg TID + Imdur 30 mg daily  Prior to admission HF Medications: None  Pertinent Lab Values: Serum creatinine 1.28 (down), BUN 27, Potassium 4.6, Sodium 133, BNP 1211.4, Magnesium 2, A1c 6.9   Vital Signs: Weight: 164 lbs (admission weight: 156 (likely estimated) lbs) Blood pressure: 140/100s  Heart rate: 80-90s  I/O: -1.25L yesterday; net -5.7L  Medication Assistance / Insurance Benefits Check: Does the patient have prescription insurance?  Yes Type of insurance plan: [facility] Medicare  Outpatient Pharmacy:  Prior to admission outpatient pharmacy: [facility] Pharm[facility] Is the patient willing to use [facility] TOC pharmacy at discharge? Yes Is the patient willing to transition their outpatient pharmacy to utilize a [facility] outpatient pharmacy?   Pending    Assessment: 1. Acute systolic CHF (LVEF 35%), due to NICM. NYHA class II symptoms. - Consider resuming IV diuresis given RHC results today showing elevated filling pressures, slowed uop 2.7 L > 1.2L yesterday and weight up 2lb with PO  diuresis. - Agree with transitioning to metoprolol XL 25 mg daily tomorrow - Consider starting Entresto 24/26 mg BID tomorrow if SCr stable - appears new baseline around 1.5 - Consider starting spironolactone prior to discharge if SCr allows - Consider starting Farxiga 10 mg daily prior to discharge - Continue hydralazine 25 mg TID + Imdur 30 mg daily   Plan: 1) Medication changes recommended at this time: - Restart IV lasix at 40 mg daily given good response to oral - Start Entresto 24/26 mg BID  2) Patient assistance: Sherryll Burger copay $0 - Jardiance/Farxiga copay $0  3)  Education  - To be completed prior to discharge  Filbert Schilder, PharmD PGY1 Pharmacy Resident 03/10/2022  10:29 AM

## 2022-03-10 NOTE — Progress Notes (Signed)
SATURATION QUALIFICATIONS: (This note is used to comply with regulatory documentation for home oxygen)  Patient Saturations on Room Air at Rest = 98%  Patient Saturations on Room Air while Ambulating = 95%   

## 2022-03-10 NOTE — Progress Notes (Signed)
PROGRESS NOTE    Marcus Tran  XYO:[id number] DOB: 10-25-1950 DOA: 03/06/2022 PCP: Hillery Aldo, NP   Brief Narrative:  71 yo M with limited past medical history - known tobacco use/abuse and prior use of bronchodilators but no formal diagnosis of asthma/COPD. Patient presented to with worsening shortness of breath and dyspnea with exertion - hypoxic into 60s in the field -improved with steroids, nebs, and NRB ultimately requiring Bipap at intake. CTA neg for PE but is suggestive of fluid overload.   5/19 admitted requiring BiPAP for hypoxic/hypercarbic resp failure; admitted to ICU under PCCM care 5/20 weaned off BiPAP to nasal cannula with markedly improving symptoms 5/21 continues to wean oxygen as appropriate, symptoms markedly improving 5/22 Cardiac cath with elevated filling pressures - recommend further diuresis 5/23 - new Entresto - follow tolerance - likely DC in 24-48h   Assessment & Plan:   Principal Problem:   Acute respiratory failure with hypoxia and hypercapnia (HCC) Active Problems:   Acute metabolic encephalopathy   Tobacco abuse   Bilateral hilar adenopathy syndrome   HTN (hypertension)   Stage 3a chronic kidney disease (CKD) (HCC)   Coronary artery disease   Acute on chronic combined systolic and diastolic CHF (congestive heart failure) (HCC)  Acute metabolic encephalopathy due to hypoxia/hypercarbia, POA -Resolved with treatment as below  Acute respiratory failure with hypoxia and hypercarbia Suspected underlying COPD / emphysema on CT scan Hx tobacco abuse  Incidental finding: bilateral hilar lymph nodes 1.2cm on R 1.1 cm on L, subcarinal and precarinal mediastinal nodes, 11mm posterior basal RLL nodule, scattered small subpleural nodules  -CTA neg for PE, no evidence of pneumonia -COVID, flu a/b neg -Continue steroids x5 days per PCCM recommendations -Wean oxygen as tolerated, BiPAP as needed (not required in >72 hours) -Outpatient CT in 68mo for  incidentally noted pulmonary nodules -pulmonology to follow  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% -Elevated BNP/troponin -Status post Lasix x1 with appropriate output -Cardiology following, appreciate insight and recommendations -Cath 5/23 shows elevated filling pressures - ongoing diuresis recommended -Entresto started - if tolerated possible DC in 24-48h   HTN Continue hydralazine, isosorbide -Entresto started as above   CAD -Plan left heart catheterization later this admission cardiology consultation   CKD stage IIIa -no prior Cr in EMR -- admitting Cr with 1.57 -Stable, likely at baseline  DVT prophylaxis: Heparin held perioperatively by cardiology Code Status: Full Family Communication: None present  Status is: Inpatient  Dispo: The patient is from: Home              Anticipated d/c is to: Home - no recs per PT              Anticipated d/c date is: 24 to 48 hours              Patient currently not medically stable for discharge  Consultants:  Cardiology, PCCM  Procedures:  Cardiac cath 03/10/2022  Antimicrobials:  None indicated  Subjective: No acute issues or events overnight denies nausea vomiting diarrhea constipation headache fevers chills or chest pain.  Objective: Vitals:   03/09/22 1800 03/09/22 1845 03/09/22 1948 03/10/22 0430  BP: (!) 132/102 (!) 154/100 (!) 145/110 (!) 153/105  Pulse: 86 89 91 87  Resp:   18 16  Temp:   (!) 97.4 F (36.3 C) 98 F (36.7 C)  TempSrc:   Oral Oral  SpO2: 100% 100% 97% 100%  Weight:    74.4 kg  Height:  Intake/Output Summary (Last 24 hours) at 03/10/2022 0735 Last data filed at 03/10/2022 0715 Gross per 24 hour  Intake 480 ml  Output 1075 ml  Net -595 ml    Filed Weights   03/08/22 0415 03/09/22 0445 03/10/22 0430  Weight: 72.7 kg 73.6 kg 74.4 kg    Examination:  General exam: Appears calm and comfortable  Respiratory system: Clear to auscultation. Respiratory  effort normal. Cardiovascular system: S1 & S2 heard, RRR. No JVD, murmurs, rubs, gallops or clicks. No pedal edema. Gastrointestinal system: Abdomen is nondistended, soft and nontender. No organomegaly or masses felt. Normal bowel sounds heard. Central nervous system: Alert and oriented. No focal neurological deficits. Extremities: Symmetric 5 x 5 power. Skin: No rashes, lesions or ulcers Psychiatry: Judgement and insight appear normal. Mood & affect appropriate.   Data Reviewed: I have personally reviewed following labs and imaging studies  CBC: Recent Labs  Lab 03/06/22 0503 03/06/22 0543 03/07/22 0213 03/08/22 0044 03/09/22 0654 03/09/22 1633 03/10/22 0234  WBC 8.0  --  16.7* 19.2* 13.3*  --  11.1*  NEUTROABS 4.1  --   --   --   --   --   --   HGB 18.6*   < > 16.5 15.6 14.9 17.0 15.1  HCT 57.4*   < > 51.0 46.7 46.8 50.0 45.3  MCV 92.4  --  90.7 90.7 91.9  --  89.9  PLT 179  --  179 177 177  --  174   < > = values in this interval not displayed.    Basic Metabolic Panel: Recent Labs  Lab 03/06/22 1841 03/06/22 1912 03/07/22 0213 03/07/22 1422 03/08/22 0044 03/09/22 0654 03/09/22 1633 03/10/22 0234  NA 139   < > 134* 134* 131* 134* 135 133*  K 3.7   < > 3.7 4.2 4.4 3.9 4.6 4.6  CL 96*  --  98 99 100 100  --  98  CO2 29  --  25 27 26 29   --  27  GLUCOSE 231*  --  171* 130* 193* 114*  --  144*  BUN 18  --  21 23 25* 21  --  27*  CREATININE 1.67*  --  1.51* 1.55* 1.66* 1.53*  --  1.28*  CALCIUM 8.7*  --  8.6* 8.7* 8.2* 8.4*  --  8.3*  MG 2.5*  --   --   --  2.3  --   --   --   PHOS 3.7  --   --   --   --   --   --   --    < > = values in this interval not displayed.    GFR: Estimated Creatinine Clearance: 49.9 mL/min (A) (by C-G formula based on SCr of 1.28 mg/dL (H)). Liver Function Tests: No results for input(s): AST, ALT, ALKPHOS, BILITOT, PROT, ALBUMIN in the last 168 hours. No results for input(s): LIPASE, AMYLASE in the last 168 hours. No results for  input(s): AMMONIA in the last 168 hours. Coagulation Profile: No results for input(s): INR, PROTIME in the last 168 hours. Cardiac Enzymes: No results for input(s): CKTOTAL, CKMB, CKMBINDEX, TROPONINI in the last 168 hours. BNP (last 3 results) No results for input(s): PROBNP in the last 8760 hours. HbA1C: Recent Labs    03/07/22 1422  HGBA1C 6.9*    CBG: Recent Labs  Lab 03/08/22 2111 03/09/22 0735 03/09/22 1230 03/09/22 1811 03/09/22 2157  GLUCAP 272* 129* 155* 167* 360*  Lipid Profile: No results for input(s): CHOL, HDL, LDLCALC, TRIG, CHOLHDL, LDLDIRECT in the last 72 hours. Thyroid Function Tests: No results for input(s): TSH, T4TOTAL, FREET4, T3FREE, THYROIDAB in the last 72 hours. Anemia Panel: No results for input(s): VITAMINB12, FOLATE, FERRITIN, TIBC, IRON, RETICCTPCT in the last 72 hours. Sepsis Labs: Recent Labs  Lab 03/06/22 1121  PROCALCITON <0.10     Recent Results (from the past 240 hour(s))  Resp Panel by RT-PCR (Flu A&B, Covid) Nasopharyngeal Swab     Status: None   Collection Time: 03/06/22  5:22 AM   Specimen: Nasopharyngeal Swab; Nasopharyngeal(NP) swabs in vial transport medium  Result Value Ref Range Status   SARS Coronavirus 2 by RT PCR NEGATIVE NEGATIVE Final    Comment: (NOTE) SARS-CoV-2 target nucleic acids are NOT DETECTED.  The SARS-CoV-2 RNA is generally detectable in upper respiratory specimens during the acute phase of infection. The lowest concentration of SARS-CoV-2 viral copies this assay can detect is 138 copies/mL. A negative result does not preclude SARS-Cov-2 infection and should not be used as the sole basis for treatment or other patient management decisions. A negative result may occur with  improper specimen collection/handling, submission of specimen other than nasopharyngeal swab, presence of viral mutation(s) within the areas targeted by this assay, and inadequate number of viral copies(<138 copies/mL). A  negative result must be combined with clinical observations, patient history, and epidemiological information. The expected result is Negative.  Fact Sheet for Patients:  BloggerCourse.com  Fact Sheet for Healthcare Providers:  SeriousBroker.it  This test is no t yet approved or cleared by the [place] FDA and  has been authorized for detection and/or diagnosis of SARS-CoV-2 by FDA under an Emergency Use Authorization (EUA). This EUA will remain  in effect (meaning this test can be used) for the duration of the COVID-19 declaration under Section 564(b)(1) of the Act, 21 U.S.C.section 360bbb-3(b)(1), unless the authorization is terminated  or revoked sooner.       Influenza A by PCR NEGATIVE NEGATIVE Final   Influenza B by PCR NEGATIVE NEGATIVE Final    Comment: (NOTE) The Xpert Xpress SARS-CoV-2/FLU/RSV plus assay is intended as an aid in the diagnosis of influenza from Nasopharyngeal swab specimens and should not be used as a sole basis for treatment. Nasal washings and aspirates are unacceptable for Xpert Xpress SARS-CoV-2/FLU/RSV testing.  Fact Sheet for Patients: BloggerCourse.com  Fact Sheet for Healthcare Providers: SeriousBroker.it  This test is not yet approved or cleared by the [place] FDA and has been authorized for detection and/or diagnosis of SARS-CoV-2 by FDA under an Emergency Use Authorization (EUA). This EUA will remain in effect (meaning this test can be used) for the duration of the COVID-19 declaration under Section 564(b)(1) of the Act, 21 U.S.C. section 360bbb-3(b)(1), unless the authorization is terminated or revoked.  Performed at [facility], [address] [place], [place] [address]   Culture, blood (Routine X 2) w Reflex to ID Panel     Status: None (Preliminary result)   Collection Time: 03/06/22  8:28 AM   Specimen: BLOOD   Result Value Ref Range Status   Specimen Description BLOOD SITE NOT SPECIFIED  Final   Special Requests   Final    BOTTLES DRAWN AEROBIC AND ANAEROBIC Blood Culture adequate volume   Culture   Final    NO GROWTH 3 DAYS Performed at [facility], [address], [place], [place] [address]    Report Status PENDING  Incomplete  Respiratory (~20 pathogens) panel by PCR     Status: None   Collection Time: 03/06/22  8:39 AM   Specimen: Nasopharyngeal Swab; Respiratory  Result Value Ref Range Status   Adenovirus NOT DETECTED NOT DETECTED Final   Coronavirus 229E NOT DETECTED NOT DETECTED Final    Comment: (NOTE) The Coronavirus on the Respiratory Panel, DOES NOT test for the novel  Coronavirus (2019 nCoV)    Coronavirus HKU1 NOT DETECTED NOT DETECTED Final   Coronavirus NL63 NOT DETECTED NOT DETECTED Final   Coronavirus OC43 NOT DETECTED NOT DETECTED Final   Metapneumovirus NOT DETECTED NOT DETECTED Final   Rhinovirus / Enterovirus NOT DETECTED NOT DETECTED Final   Influenza A NOT DETECTED NOT DETECTED Final   Influenza B NOT DETECTED NOT DETECTED Final   Parainfluenza Virus 1 NOT DETECTED NOT DETECTED Final   Parainfluenza Virus 2 NOT DETECTED NOT DETECTED Final   Parainfluenza Virus 3 NOT DETECTED NOT DETECTED Final   Parainfluenza Virus 4 NOT DETECTED NOT DETECTED Final   Respiratory Syncytial Virus NOT DETECTED NOT DETECTED Final   Bordetella pertussis NOT DETECTED NOT DETECTED Final   Bordetella Parapertussis NOT DETECTED NOT DETECTED Final   Chlamydophila pneumoniae NOT DETECTED NOT DETECTED Final   Mycoplasma pneumoniae NOT DETECTED NOT DETECTED Final    Comment: Performed at [facility], [place]. [address] [place], [place] [address]  Culture, blood (Routine X 2) w Reflex to ID Panel     Status: None (Preliminary result)   Collection Time: 03/06/22  9:02 AM   Specimen: BLOOD  Result Value Ref Range Status   Specimen Description BLOOD SITE NOT SPECIFIED  Final    Special Requests   Final    BOTTLES DRAWN AEROBIC AND ANAEROBIC Blood Culture results may not be optimal due to an inadequate volume of blood received in culture bottles   Culture   Final    NO GROWTH 3 DAYS Performed at [facility], [place] [address], [place], [place] [address]    Report Status PENDING  Incomplete  MRSA Next Gen by PCR, Nasal     Status: None   Collection Time: 03/06/22 10:33 AM   Specimen: Nasal Mucosa; Nasal Swab  Result Value Ref Range Status   MRSA by PCR Next Gen NOT DETECTED NOT DETECTED Final    Comment: (NOTE) The GeneXpert MRSA Assay (FDA approved for NASAL specimens only), is one component of a comprehensive MRSA colonization surveillance program. It is not intended to diagnose MRSA infection nor to guide or monitor treatment for MRSA infections. Test performance is not FDA approved in patients less than 63 years old. Performed at [facility], [place] [address], [place], [place] [address]           Radiology Studies: CARDIAC CATHETERIZATION  Result Date: 03/09/2022 Images from the original result were not included.   1st Mrg lesion is 50% stenosed. Marcus Tran is a 71 y.o. male  [id number] LOCATION:  FACILITY: [facility] PHYSICIAN: Quay Burow, M.D. [birth year] DATE OF PROCEDURE:  03/09/2022 DATE OF DISCHARGE: CARDIAC CATHETERIZATION History obtained from chart review.71 y.o. male with prior history of tobacco smoking, no regular follow-up who admitted for acute hypoxic respiratory failure with suspected underlying COPD and heart failure exacerbation.  PROCEDURE DESCRIPTION: The patient was brought to the second floor [place] [facility] cath lab in the postabsorptive state. He was premedicated with IV Versed and fentanyl. His right wrist and antecubital fossa Were prepped and shaved in usual sterile  fashion. Xylocaine 1% was used for local anesthesia. A 6 French sheath was inserted into the right radial artery using standard Seldinger technique.  A 5  French sheath was inserted into the right antecubital vein.  A 5 French balloontipped Swan-Ganz catheter was then advanced to the right heart chambers obtaining sequential pressures and pulmonary artery blood samples for the determination of Fick cardiac output.  A 5 [place] TIG catheter and right Judkins catheters were used for selective coronary angiography and obtaining left heart pressures.  Isovue dye is used for the entirety of the case (40 cc contrast total to patient).  Retrograde aortic, left ventricular and pullback pressures were recorded.  Radial cocktail was administered via the SideArm sheath.  The patient received 4000 units  of heparin intravenously.  HEMODYNAMICS:  1: Right atrial pressure-18/4 2: Right ventricular pressure-55/10 3: Pulmonary artery pressure-54/29, mean 37 4: Pulmonary wedge pressure-A-wave 24, V wave 25, mean 23 5: LVEDP-22 6: Cardiac output-3.8 L/min with an index of 2.1 L/min/m.   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  He still appears somewhat wet hemodynamically.  He will require additional diuresis and guideline directed optimal medical therapy.  Given his serum creatinine, I do not think he is a candidate for Entresto and/or ACE/ARB.  The radial sheath was removed and a TR band was placed on the right wrist to achieve patent hemostasis.  The patient left lab in stable condition.  Dr. Irish Lack was notified of these results. Quay Burow. MD, [facility] 03/09/2022 4:53 PM         Scheduled Meds:  aspirin  81 mg Oral Daily   atorvastatin  40 mg Oral QHS   chlorhexidine  15 mL Mouth Rinse BID   furosemide  20 mg Oral Daily   guaiFENesin  600 mg Oral BID   hydrALAZINE  25 mg Oral Q8H   insulin aspart  0-15 Units Subcutaneous TID WC   insulin aspart  0-5 Units Subcutaneous QHS   isosorbide mononitrate  30 mg Oral Daily   mouth rinse  15 mL Mouth Rinse q12n4p   metoprolol tartrate  12.5 mg Oral BID   pantoprazole  40 mg Oral QHS    predniSONE  40 mg Oral Q breakfast   sodium chloride flush  3 mL Intravenous Q12H   sodium chloride flush  3 mL Intravenous Q12H   sodium chloride HYPERTONIC  4 mL Nebulization TID   umeclidinium-vilanterol  1 puff Inhalation Daily   Continuous Infusions:  sodium chloride Stopped (03/07/22 1735)   sodium chloride       LOS: 4 days   Time spent: 2min  Marcus Tran C Iley Breeden, DO [facility]  If 7PM-7AM, please contact night-coverage [email or web address]  03/10/2022, 7:35 AM

## 2022-03-10 NOTE — Care Management Important Message (Signed)
Important Message  Patient Details  Name: Marcus Tran MRN: [id number] Date of Birth: 05/10/1951   Medicare Important Message Given:  Yes     Renie Ora 03/10/2022, 8:59 AM

## 2022-03-10 NOTE — Progress Notes (Signed)
Physical Therapy Treatment/ Discharge Patient Details Name: Marcus Tran MRN: [id number] DOB: [birth year] Today's Date: 03/10/2022   History of Present Illness 71 y.o. male admitted 03/06/2022 with SOB and hypoxia, required BiPAP in ED. Pt with hypoxic/hypercarbic resp failure and acute systolic heart failure. 5/22 cardiac cath. PMHx:progressive obstructive lung disease, HTN, CKD, CAD, CHF and tobacco use.    PT Comments    Pt moving in room and hallway without assist. Pt educated for post cath restrictions and able to perform transfers without use of UE. Pt at baseline functional level without need for further therapy intervention with pt aware and agreeable, will sign off.   HR 94-98 BP 157/109 (121)    Recommendations for follow up therapy are one component of a multi-disciplinary discharge planning process, led by the attending physician.  Recommendations may be updated based on patient status, additional functional criteria and insurance authorization.  Follow Up Recommendations  No PT follow up     Assistance Recommended at Discharge None  Patient can return home with the following     Equipment Recommendations  None recommended by PT    Recommendations for Other Services       Precautions / Restrictions Precautions Precaution Comments: 5/22 radial heart cath Restrictions Weight Bearing Restrictions: No     Mobility  Bed Mobility               General bed mobility comments: pt standing on arrival and sitting at sink end of session    Transfers Overall transfer level: Independent                      Ambulation/Gait Ambulation/Gait assistance: Independent Gait Distance (Feet): 600 Feet Assistive device: None Gait Pattern/deviations: WFL(Within Functional Limits)   Gait velocity interpretation: >4.37 ft/sec, indicative of normal walking speed       Stairs             Wheelchair Mobility    Modified Rankin (Stroke Patients Only)        Balance Overall balance assessment: No apparent balance deficits (not formally assessed)                                          Cognition Arousal/Alertness: Awake/alert Behavior During Therapy: WFL for tasks assessed/performed Overall Cognitive Status: Within Functional Limits for tasks assessed                                          Exercises Other Exercises Other Exercises: pt able to perform 5 repeated sit to stands in 15 sec    General Comments        Pertinent Vitals/Pain Pain Assessment Pain Assessment: No/denies pain    Home Living                          Prior Function            PT Goals (current goals can now be found in the care plan section) Progress towards PT goals: Goals met/education completed, patient discharged from PT    Frequency    Min 2X/week      PT Plan Current plan remains appropriate    Co-evaluation  AM-PAC PT "6 Clicks" Mobility   Outcome Measure  Help needed turning from your back to your side while in a flat bed without using bedrails?: None Help needed moving from lying on your back to sitting on the side of a flat bed without using bedrails?: None Help needed moving to and from a bed to a chair (including a wheelchair)?: None Help needed standing up from a chair using your arms (e.g., wheelchair or bedside chair)?: None Help needed to walk in hospital room?: None Help needed climbing 3-5 steps with a railing? : None 6 Click Score: 24    End of Session   Activity Tolerance: Patient tolerated treatment well Patient left: in chair (at sink to bathe) Nurse Communication: Mobility status PT Visit Diagnosis: Other abnormalities of gait and mobility (R26.89)     Time: 0240-9735 PT Time Calculation (min) (ACUTE ONLY): 17 min  Charges:  $Gait Training: 8-22 mins                     Craig Ionescu P, PT Acute Rehabilitation Services Pager:  [phone] Office: [phone]    Sandy Salaam Willoughby Doell 03/10/2022, 12:52 PM

## 2022-03-10 NOTE — Progress Notes (Addendum)
Progress Note  Patient Name: ARIANO INDA Date of Encounter: 03/10/2022  [facility] Cardiologist: Minus Breeding, MD   Subjective   Breathing is improving.  Denies chest pain.  Inpatient Medications    Scheduled Meds:  aspirin  81 mg Oral Daily   atorvastatin  40 mg Oral QHS   chlorhexidine  15 mL Mouth Rinse BID   furosemide  20 mg Oral Daily   guaiFENesin  600 mg Oral BID   hydrALAZINE  25 mg Oral Q8H   insulin aspart  0-15 Units Subcutaneous TID WC   insulin aspart  0-5 Units Subcutaneous QHS   isosorbide mononitrate  30 mg Oral Daily   mouth rinse  15 mL Mouth Rinse q12n4p   metoprolol tartrate  12.5 mg Oral BID   pantoprazole  40 mg Oral QHS   predniSONE  40 mg Oral Q breakfast   sodium chloride flush  3 mL Intravenous Q12H   sodium chloride flush  3 mL Intravenous Q12H   umeclidinium-vilanterol  1 puff Inhalation Daily   Continuous Infusions:  sodium chloride Stopped (03/07/22 1735)   sodium chloride     PRN Meds: sodium chloride, sodium chloride, acetaminophen, albuterol, docusate sodium, hydrALAZINE, ipratropium-albuterol, morphine injection, nitroGLYCERIN, ondansetron (ZOFRAN) IV, polyethylene glycol, sodium chloride flush   Vital Signs    Vitals:   03/09/22 1948 03/10/22 0430 03/10/22 0852 03/10/22 0901  BP: (!) 145/110 (!) 153/105  (!) 141/118  Pulse: 91 87  87  Resp: 18 16  16   Temp: (!) 97.4 F (36.3 C) 98 F (36.7 C)  97.7 F (36.5 C)  TempSrc: Oral Oral  Oral  SpO2: 97% 100% 97% 97%  Weight:  74.4 kg    Height:        Intake/Output Summary (Last 24 hours) at 03/10/2022 1010 Last data filed at 03/10/2022 0900 Gross per 24 hour  Intake 840 ml  Output 1275 ml  Net -435 ml      03/10/2022    4:30 AM 03/09/2022    4:45 AM 03/08/2022    4:15 AM  Last 3 Weights  Weight (lbs) 164 lb 162 lb 3.2 oz 160 lb 4.8 oz  Weight (kg) 74.39 kg 73.573 kg 72.712 kg      Telemetry    SR, PVCS - Personally Reviewed  ECG    No new  tracing  Physical Exam   GEN: No acute distress.   Neck: No JVD Cardiac: RRR, no murmurs, rubs, or gallops.  Respiratory: Clear to auscultation bilaterally. GI: Soft, nontender, non-distended  MS: No edema; No deformity. Neuro:  Nonfocal  Psych: Normal affect   Labs    High Sensitivity Troponin:   Recent Labs  Lab 03/06/22 0503 03/06/22 0737 03/06/22 1841 03/06/22 2155  TROPONINIHS 53* 46* 45* 46*     Chemistry Recent Labs  Lab 03/06/22 1841 03/06/22 1912 03/08/22 0044 03/09/22 0654 03/09/22 1633 03/10/22 0234  NA 139   < > 131* 134* 135 133*  K 3.7   < > 4.4 3.9 4.6 4.6  CL 96*   < > 100 100  --  98  CO2 29   < > 26 29  --  27  GLUCOSE 231*   < > 193* 114*  --  144*  BUN 18   < > 25* 21  --  27*  CREATININE 1.67*   < > 1.66* 1.53*  --  1.28*  CALCIUM 8.7*   < > 8.2* 8.4*  --  8.3*  MG 2.5*  --  2.3  --   --   --   GFRNONAA 43*   < > 44* 48*  --  60*  ANIONGAP 14   < > 5 5  --  8   < > = values in this interval not displayed.    Lipids No results for input(s): CHOL, TRIG, HDL, LABVLDL, LDLCALC, CHOLHDL in the last 168 hours.  Hematology Recent Labs  Lab 03/08/22 0044 03/09/22 0654 03/09/22 1633 03/10/22 0234  WBC 19.2* 13.3*  --  11.1*  RBC 5.15 5.09  --  5.04  HGB 15.6 14.9 17.0 15.1  HCT 46.7 46.8 50.0 45.3  MCV 90.7 91.9  --  89.9  MCH 30.3 29.3  --  30.0  MCHC 33.4 31.8  --  33.3  RDW 15.0 15.1  --  15.3  PLT 177 177  --  174   Thyroid No results for input(s): TSH, FREET4 in the last 168 hours.  BNP Recent Labs  Lab 03/06/22 0503  BNP 1,211.4*    Radiology    CARDIAC CATHETERIZATION  Result Date: 03/09/2022 Images from the original result were not included.   1st Mrg lesion is 50% stenosed. ANNE MANEVAL is a 71 y.o. male  [id number] LOCATION:  FACILITY: [facility] PHYSICIAN: Nanetta Batty, M.D. 1951-04-24 DATE OF PROCEDURE:  03/09/2022 DATE OF DISCHARGE: CARDIAC CATHETERIZATION History obtained from chart review.71 y.o. male with prior history  of tobacco smoking, no regular follow-up who admitted for acute hypoxic respiratory failure with suspected underlying COPD and heart failure exacerbation.  PROCEDURE DESCRIPTION: The patient was brought to the second floor [facility]iac cath lab in the postabsorptive state. He was premedicated with IV Versed and fentanyl. His right wrist and antecubital fossa Were prepped and shaved in usual sterile fashion. Xylocaine 1% was used for local anesthesia. A 6 French sheath was inserted into the right radial artery using standard Seldinger technique.  A 5 French sheath was inserted into the right antecubital vein.  A 5 French balloontipped Swan-Ganz catheter was then advanced to the right heart chambers obtaining sequential pressures and pulmonary artery blood samples for the determination of Fick cardiac output.  A 5 [place] TIG catheter and right Judkins catheters were used for selective coronary angiography and obtaining left heart pressures.  Isovue dye is used for the entirety of the case (40 cc contrast total to patient).  Retrograde aortic, left ventricular and pullback pressures were recorded.  Radial cocktail was administered via the SideArm sheath.  The patient received 4000 units  of heparin intravenously.  HEMODYNAMICS:  1: Right atrial pressure-18/4 2: Right ventricular pressure-55/10 3: Pulmonary artery pressure-54/29, mean 37 4: Pulmonary wedge pressure-A-wave 24, V wave 25, mean 23 5: LVEDP-22 6: Cardiac output-3.8 L/min with an index of 2.1 L/min/m.   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  He still appears somewhat wet hemodynamically.  He will require additional diuresis and guideline directed optimal medical therapy.  Given his serum creatinine, I do not think he is a candidate for Entresto and/or ACE/ARB.  The radial sheath was removed and a TR band was placed on the right wrist to achieve patent hemostasis.  The patient left lab in stable condition.  Dr.  Eldridge Dace was notified of these results. Nanetta Batty. MD, [facility] 03/09/2022 4:53 PM     Cardiac Studies   Echo 03/06/22.  1. Left ventricular ejection fraction, by estimation, is 35%. The left  ventricle has moderately decreased function.  The left ventricle  demonstrates global hypokinesis. Left ventricular diastolic parameters are  consistent with Grade II diastolic  dysfunction (pseudonormalization).   2. Right ventricular systolic function is normal. The right ventricular  size is normal. There is moderately elevated pulmonary artery systolic  pressure. The estimated right ventricular systolic pressure is 54.0 mmHg.   3. Left atrial size was mildly dilated.   4. Right atrial size was mildly dilated.   5. The mitral valve is degenerative. Trivial mitral valve regurgitation.  No evidence of mitral stenosis. Moderate mitral annular calcification.   6. The aortic valve is tricuspid. There is mild calcification of the  aortic valve. Aortic valve regurgitation is not visualized. No aortic  stenosis is present.   7. The inferior vena cava is normal in size with <50% respiratory  variability, suggesting right atrial pressure of 8 mmHg.    RIGHT/LEFT HEART CATH AND CORONARY ANGIOGRAPHY 03/09/22 HEMODYNAMICS:    1: Right atrial pressure-18/4 2: Right ventricular pressure-55/10 3: Pulmonary artery pressure-54/29, mean 37 4: Pulmonary wedge pressure-A-wave 24, V wave 25, mean 23 5: LVEDP-22 6: Cardiac output-3.8 L/min with an index of 2.1 L/min/m.  IMPRESSION: Mr. Verhoef has a nonischemic cardiomyopathy with elevated filling pressures.  He still appears somewhat wet hemodynamically.  He will require additional diuresis and guideline directed optimal medical therapy.  Given his serum creatinine, I do not think he is a candidate for Entresto and/or ACE/ARB.  The radial sheath was removed and a TR band was placed on the right wrist to achieve patent hemostasis.  The patient left lab in stable  condition.  Dr. Eldridge Dace was notified of these results.  Diagnostic Dominance: Right    Flowsheet Row Most Recent Value  Fick Cardiac Output 3.84 L/min  Fick Cardiac Output Index 2.12 (L/min)/BSA  RA A Wave 18 mmHg  RA V Wave 14 mmHg  RA Mean 12 mmHg  RV Systolic Pressure 55 mmHg  RV Diastolic Pressure 10 mmHg  RV EDP 16 mmHg  PA Systolic Pressure 54 mmHg  PA Diastolic Pressure 29 mmHg  PA Mean 37 mmHg  PW A Wave 24 mmHg  PW V Wave 25 mmHg  PW Mean 23 mmHg  AO Systolic Pressure 120 mmHg  AO Diastolic Pressure 82 mmHg  AO Mean 99 mmHg  LV Systolic Pressure 123 mmHg  LV Diastolic Pressure 12 mmHg  LV EDP 22 mmHg  AOp Systolic Pressure 132 mmHg  AOp Diastolic Pressure 92 mmHg  AOp Mean Pressure 110 mmHg  LVp Systolic Pressure 121 mmHg  LVp Diastolic Pressure 14 mmHg  LVp EDP Pressure 25 mmHg  QP/QS 1  TPVR Index 17.48 HRUI  TSVR Index 46.76 HRUI  PVR SVR Ratio 0.16  TPVR/TSVR Ratio 0.37   Patient Profile        71 y.o. male with prior history of tobacco smoking, no regular follow-up who admitted for acute hypoxic respiratory failure with suspected underlying COPD and heart failure exacerbation.  Assessment & Plan      1.  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  RVSP 54 mmHg. -Only got IV Lasix 40 mg x 1 during admission.   -Cardiac catheterization yesterday showed elevated filling pressure. -Now started on p.o. Lasix 20 mg daily -He is laying flat. -Net INO -5.7 L. Weight is going up 160>>>162>>164lb. -Continue hydralazine/Imdur -Continue metoprolol tartrate 12.5 mg twice daily>> transition to long-acting tomorrow -add SGLT2 -Serum creatinine is improving.  Likely start [facility]  today or tomorrow -Changing p.o. Lasix to IV per MD   2.  Chronic kidney disease stage III -No prior readings - Scr now improved to 1.28   3.  Hypertension -Blood pressure improving -New  beta-blocker, hydralazine and Imdur -Add Entresto/Spiro per MD  For questions or updates, please contact [place] Please consult [email or web address] for contact info under        Signed, Leanor Kail, PA  03/10/2022, 10:10 AM      I have examined the patient and reviewed assessment and plan and discussed with patient.  Agree with above as stated.    NICM.  Radial site stable.  Will give IV Lasix for further diuresis.  Titrate CHF meds as renal function allows.   Patient wants to go home.  WIll try to optimize meds as quickly as possible.  Low salt diet will be helpful as well.   Larae Grooms

## 2022-03-10 NOTE — Progress Notes (Signed)
Mobility Specialist Progress Note    03/10/22 1445  Mobility  Activity Ambulated independently in hallway  Level of Assistance Independent  Assistive Device None  Distance Ambulated (ft) 470 ft  Activity Response Tolerated well  $Mobility charge 1 Mobility   Pre-Mobility: 82 HR During Mobility: 95 HR Post-Mobility: 88 HR  Pt received in chair and agreeable. No complaints on walk. Returned to sitting EOB with call bell in reach.    [place] Mobility Specialist  Primary: 5N M.S. Phone: [phone] Secondary: 6N M.S. Phone: [phone]

## 2022-03-11 ENCOUNTER — Encounter (HOSPITAL_COMMUNITY): Payer: Self-pay | Admitting: Internal Medicine

## 2022-03-11 ENCOUNTER — Other Ambulatory Visit (HOSPITAL_COMMUNITY): Payer: Self-pay

## 2022-03-11 DIAGNOSIS — J9601 Acute respiratory failure with hypoxia: Secondary | ICD-10-CM | POA: Diagnosis not present

## 2022-03-11 DIAGNOSIS — I5043 Acute on chronic combined systolic (congestive) and diastolic (congestive) heart failure: Secondary | ICD-10-CM | POA: Diagnosis not present

## 2022-03-11 DIAGNOSIS — J9602 Acute respiratory failure with hypercapnia: Secondary | ICD-10-CM | POA: Diagnosis not present

## 2022-03-11 DIAGNOSIS — N1831 Chronic kidney disease, stage 3a: Secondary | ICD-10-CM | POA: Diagnosis not present

## 2022-03-11 DIAGNOSIS — I10 Essential (primary) hypertension: Secondary | ICD-10-CM | POA: Diagnosis not present

## 2022-03-11 DIAGNOSIS — Z720 Tobacco use: Secondary | ICD-10-CM | POA: Diagnosis not present

## 2022-03-11 LAB — CBC
HCT: 50 % (ref 39.0–52.0)
Hemoglobin: 16.9 g/dL (ref 13.0–17.0)
MCH: 29.7 pg (ref 26.0–34.0)
MCHC: 33.8 g/dL (ref 30.0–36.0)
MCV: 87.9 fL (ref 80.0–100.0)
Platelets: 211 10*3/uL (ref 150–400)
RBC: 5.69 MIL/uL (ref 4.22–5.81)
RDW: 14.8 % (ref 11.5–15.5)
WBC: 14.8 10*3/uL — ABNORMAL HIGH (ref 4.0–10.5)
nRBC: 0 % (ref 0.0–0.2)

## 2022-03-11 LAB — CULTURE, BLOOD (ROUTINE X 2)
Culture: NO GROWTH
Culture: NO GROWTH
Special Requests: ADEQUATE

## 2022-03-11 LAB — LIPOPROTEIN A (LPA): Lipoprotein (a): 56.3 nmol/L — ABNORMAL HIGH (ref <75.0)

## 2022-03-11 LAB — GLUCOSE, CAPILLARY
Glucose-Capillary: 218 mg/dL — ABNORMAL HIGH (ref 70–99)
Glucose-Capillary: 167 mg/dL — ABNORMAL HIGH (ref 70–99)

## 2022-03-11 LAB — BASIC METABOLIC PANEL
Anion gap: 9 (ref 5–15)
BUN: 25 mg/dL — ABNORMAL HIGH (ref 8–23)
CO2: 26 mmol/L (ref 22–32)
Calcium: 8.6 mg/dL — ABNORMAL LOW (ref 8.9–10.3)
Chloride: 96 mmol/L — ABNORMAL LOW (ref 98–111)
Creatinine, Ser: 1.31 mg/dL — ABNORMAL HIGH (ref 0.61–1.24)
GFR, Estimated: 58 mL/min — ABNORMAL LOW (ref >60)
Glucose, Bld: 142 mg/dL — ABNORMAL HIGH (ref 70–99)
Potassium: 4.4 mmol/L (ref 3.5–5.1)
Sodium: 131 mmol/L — ABNORMAL LOW (ref 135–145)

## 2022-03-11 LAB — MAGNESIUM: Magnesium: 2 mg/dL (ref 1.7–2.4)

## 2022-03-11 MED ORDER — SACUBITRIL-VALSARTAN 24-26 MG PO TABS
1.0000 | ORAL_TABLET | Freq: Two times a day (BID) | ORAL | 1 refills | Status: DC
  Filled 2022-03-11: qty 60, 30d supply, fill #0

## 2022-03-11 MED ORDER — POTASSIUM CHLORIDE CRYS ER 10 MEQ PO TBCR
10.0000 meq | EXTENDED_RELEASE_TABLET | Freq: Every day | ORAL | 1 refills | Status: DC
  Filled 2022-03-11: qty 30, 30d supply, fill #0

## 2022-03-11 MED ORDER — METOPROLOL SUCCINATE ER 25 MG PO TB24
25.0000 mg | ORAL_TABLET | Freq: Every day | ORAL | 1 refills | Status: DC
  Filled 2022-03-11: qty 30, 30d supply, fill #0

## 2022-03-11 MED ORDER — ISOSORB DINITRATE-HYDRALAZINE 20-37.5 MG PO TABS: 1.0000 | ORAL_TABLET | Freq: Three times a day (TID) | ORAL | Status: DC

## 2022-03-11 MED ORDER — ASPIRIN 81 MG PO CHEW
81.0000 mg | CHEWABLE_TABLET | Freq: Every day | ORAL | 4 refills | Status: DC
  Filled 2022-03-11: qty 30, 30d supply, fill #0

## 2022-03-11 MED ORDER — ISOSORB DINITRATE-HYDRALAZINE 20-37.5 MG PO TABS
1.0000 | ORAL_TABLET | Freq: Three times a day (TID) | ORAL | 0 refills | Status: DC
  Filled 2022-03-11: qty 90, 30d supply, fill #0

## 2022-03-11 MED ORDER — FUROSEMIDE 20 MG PO TABS
20.0000 mg | ORAL_TABLET | Freq: Every day | ORAL | 1 refills | Status: DC
  Filled 2022-03-11: qty 30, 30d supply, fill #0

## 2022-03-11 MED ORDER — NITROGLYCERIN 0.4 MG SL SUBL
0.4000 mg | SUBLINGUAL_TABLET | SUBLINGUAL | 12 refills | Status: DC | PRN
Start: 2022-03-11 — End: 2022-12-31
  Filled 2022-03-11: qty 25, 14d supply, fill #0

## 2022-03-11 MED ORDER — DAPAGLIFLOZIN PROPANEDIOL 10 MG PO TABS
10.0000 mg | ORAL_TABLET | Freq: Every day | ORAL | 1 refills | Status: DC
  Filled 2022-03-11: qty 30, 30d supply, fill #0

## 2022-03-11 NOTE — Progress Notes (Signed)
Heart Failure Stewardship Pharmacist Progress Note   PCP: Hillery Aldo, NP PCP-Cardiologist: Rollene Rotunda, MD    HPI:  71 yo M with PMH of tobacco use.  He presented to the ED on 05/19 with shortness of breath and hypoxia on RA (O2 67%) per EMS.  CXR without evidence of pulmonary edema but reflected dilated pulmonary arteries.  CTA followed with no signs of PE, possible right heart dysfunction, interstitial pneumonitis vs interstitial edema, and a 6 mm right lower lobe nodule with smaller scattered nodules throughout the lower lung fields.  An ECHO was done on 05/19 showing LVEF 35% with G2DD.  R/LHC on 5/22 with NICM (50% stenosis in 1st mrg) and elevated filling pressures - RA 12, PA 37, wedge 23; reduced cardiac output - CO 3., CI 2.1.  Current HF Medications: Diuretic: furosemide PO 20 mg daily Beta Blocker: metoprolol succinate 25 mg daily  ACE/ARB/ARNI: Entresto 24-26 mg twice daily SGLT2i: Farxiga 10 mg daily Other: hydralazine 25 mg TID + Imdur 30 mg daily  Prior to admission HF Medications: None  Pertinent Lab Values: Serum creatinine 1.31, BUN 25, Potassium 4.4, Sodium 131, BNP 1211.4, Magnesium 2, A1c 6.9%  Vital Signs: Weight: no weight today (admission weight: 156 (likely estimated) lbs) Blood pressure: 130-40/80-90  Heart rate: 80-90s  I/O: -4.65L yesterday; net -9.95L  Medication Assistance / Insurance Benefits Check: Does the patient have prescription insurance?  Yes Type of insurance plan: [facility] Medicare  Outpatient Pharmacy:  Prior to admission outpatient pharmacy: [facility] Is the patient willing to use [facility] TOC pharmacy at discharge? Yes Is the patient willing to transition their outpatient pharmacy to utilize a [facility] outpatient pharmacy?   Yes    Assessment: 1. Acute systolic CHF (LVEF 35%), due to NICM. NYHA class II symptoms. - Good uop of 4.6L with single Lasix 40 mg IV dose yesterday in addition to Lasix 20 mg PO daily. No edema or JVD on exam.   - Continue Lasix 20 mg PO daily - Continue metoprolol XL 25 mg daily - Continue Entresto 24/26 mg BID  - Continue Farxiga 10 mg daily - Continue hydralazine 25 mg TID + Imdur 30 mg daily   Plan: 1) Medication changes recommended at this time: - Planning for discharge today  - Transitioning hydralazine/Imdur combination to BiDil with $0 copay - Consider starting spironolactone at [facility] visit lon 06/05 if SCr and K allows  2) Patient assistance: - Entresto copay $0 - Jardiance/Farxiga copay $0 - BiDil copay $0  3)  Education  - Patient has been educated on current HF medications and potential additions to HF medication regimen - Patient verbalizes understanding that over the next few months, these medication doses may change and more medications may be added to optimize HF regimen - Patient has been educated on basic disease state pathophysiology and goals of therapy - Provided patient with pill box (three-times daily dosing)  Filbert Schilder, PharmD PGY1 Pharmacy Resident 03/11/2022  8:35 AM

## 2022-03-11 NOTE — Discharge Summary (Addendum)
Physician Discharge Summary  Marcus Tran [id number] DOB: 08-Feb-1951 DOA: 03/06/2022  PCP: Cipriano Mile, NP  Admit date: 03/06/2022  Discharge date: 03/11/2022  Admitted From: Home. Disposition:  Home   Recommendations for Outpatient Follow-up:  Follow up with PCP in 1-2 weeks. Please obtain BMP/CBC in one week. Advised to follow-up with cardiology as scheduled. Advised to take Lasix 20 mg daily. Advised to take Entresto, metoprolol, Farxiga as prescribed  Home Health: None Equipment/Devices: None  Discharge Condition: Stable CODE STATUS:Full code Diet recommendation: Heart Healthy  Brief [facility] Course: 71 yo M with limited past medical history - known tobacco use/abuse and prior use of bronchodilators but no formal diagnosis of asthma/COPD. Patient presented with worsening shortness of breath and dyspnea with exertion - hypoxic into 60s in the field -improved with steroids, nebs, and NRB ultimately requiring Bipap at intake. CTA neg for PE but is suggestive of fluid overload.    5/19 admitted requiring BiPAP for hypoxic/hypercarbic resp failure; admitted to ICU under PCCM care 5/20 weaned off BiPAP to nasal cannula with markedly improving symptoms. 5/21 continues to wean oxygen as appropriate, symptoms markedly improving. 5/22 Cardiac cath with elevated filling pressures - recommend further diuresis. 5/23 - new Entresto - follow tolerance - likely DC in 24-48h 5/24: tolerating medications well,  doing much better,  cleared from cardiology to be discharged.    Patient is being discharged home and will follow up    Discharge Diagnoses:  Principal Problem:   Acute respiratory failure with hypoxia and hypercapnia (HCC) Active Problems:   Acute metabolic encephalopathy   Tobacco abuse   Bilateral hilar adenopathy syndrome   HTN (hypertension)   Stage 3a chronic kidney disease (CKD) (HCC)   Coronary artery disease   Acute on chronic combined systolic and  diastolic CHF (congestive heart failure) (HCC)  Acute metabolic encephalopathy due to hypoxia/hypercarbia, POA -Resolved .  Acute respiratory failure with hypoxia and hypercarbia Suspected underlying COPD / emphysema on CT scan Hx tobacco abuse  Incidental finding: bilateral hilar lymph nodes 1.2cm on R 1.1 cm on L, subcarinal and precarinal mediastinal nodes, 27mm posterior basal RLL nodule, scattered small subpleural nodules  -CTA neg for PE, no evidence of pneumonia. -COVID, flu a/b neg -Completed steroids x5 days per PCCM recommendations. -Wean oxygen as tolerated, BiPAP as needed (not required in >72 hours). -Outpatient CT in 6 mo for incidentally noted pulmonary nodules -pulmonology to follow   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% -Elevated BNP/troponin -Status post Lasix x1 with appropriate output. -Cardiology following, appreciate insight and recommendations -Cath 5/23 shows elevated filling pressures - ongoing diuresis recommended -Entresto started - if tolerated possible DC in 24-48h.   HTN Continue hydralazine, isosorbide -Entresto started as above.   CAD Underwent left heart cath shows elevated filling pressures.   CKD stage IIIa -no prior Cr in EMR -- admitting Cr with 1.57 -Stable, likely at baseline.    Discharge Instructions  Discharge Instructions     Call MD for:  difficulty breathing, headache or visual disturbances   Complete by: As directed    Call MD for:  persistant dizziness or light-headedness   Complete by: As directed    Call MD for:  persistant nausea and vomiting   Complete by: As directed    Diet - low sodium heart healthy   Complete by: As directed    Diet Carb Modified   Complete by: As directed    Discharge instructions   Complete by: As  directed    Advised to follow-up with primary care physician in 1 week. Advised to follow-up with cardiology as scheduled. Advised to take Lasix 20 mg  daily. Advised to take Entresto, metoprolol, Wilder Glade as prescribed   Increase activity slowly   Complete by: As directed       Allergies as of 03/11/2022   No Known Allergies      Medication List     STOP taking these medications    potassium chloride 10 MEQ tablet Commonly known as: KLOR-CON Replaced by: potassium chloride 10 MEQ tablet   Symbicort 160-4.5 MCG/ACT inhaler Generic drug: budesonide-formoterol       TAKE these medications    albuterol 108 (90 Base) MCG/ACT inhaler Commonly known as: VENTOLIN HFA Inhale 1-2 puffs into the lungs every 4 (four) hours as needed for wheezing or shortness of breath.   Aspirin Low Dose 81 MG chewable tablet Generic drug: aspirin Chew 1 tablet (81 mg total) by mouth daily. Start taking on: Mar 12, 2022   atorvastatin 40 MG tablet Commonly known as: LIPITOR Take 40 mg by mouth at bedtime.   Breztri Aerosphere 160-9-4.8 MCG/ACT Aero Generic drug: Budeson-Glycopyrrol-Formoterol Take 2 puffs by mouth 2 (two) times daily.   Entresto 24-26 MG Generic drug: sacubitril-valsartan Take 1 tablet by mouth 2 (two) times daily.   Farxiga 10 MG Tabs tablet Generic drug: dapagliflozin propanediol Take 1 tablet (10 mg total) by mouth daily. Start taking on: Mar 12, 2022   furosemide 20 MG tablet Commonly known as: LASIX Take 1 tablet (20 mg total) by mouth daily.   ipratropium-albuterol 0.5-2.5 (3) MG/3ML Soln Commonly known as: DUONEB Take 3 mLs by nebulization every 6 (six) hours as needed (shortness of breath).   isosorbide-hydrALAZINE 20-37.5 MG tablet Commonly known as: BiDil Take 1 tablet by mouth 3 (three) times daily.   metoprolol succinate 25 MG 24 hr tablet Commonly known as: TOPROL-XL Take 1 tablet (25 mg total) by mouth daily. Start taking on: Mar 12, 2022   nitroGLYCERIN 0.4 MG SL tablet Commonly known as: NITROSTAT Place 1 tablet (0.4 mg total) under the tongue every 5 (five) minutes as needed for chest  pain.   potassium chloride 10 MEQ tablet Commonly known as: KLOR-CON M Take 1 tablet (10 mEq total) by mouth daily. Replaces: potassium chloride 10 MEQ tablet        Follow-up Information     [facility]. Go in 12 day(s).   Specialty: Cardiology Why: Hospital follow up PLEASE bring a current medication list FREE valet parking, off [address]tr[address], off [employer] information: [address] [id number] [place] [id number] [place], NP Follow up in 1 week(s).   Contact information: [place] [address] [phone]         Minus Breeding, MD .   Specialty: Cardiology Contact information: [address] [place] [address] [phone]                No Known Allergies  Consultations: Cardiology   Procedures/Studies: CT Angio Chest PE W and/or Wo Contrast  Result Date: 03/06/2022 CLINICAL DATA:  Shortness of breath and chest pain. EXAM: CT ANGIOGRAPHY CHEST WITH CONTRAST TECHNIQUE: Multidetector CT imaging of the chest was performed using the standard protocol during bolus administration of intravenous contrast. Multiplanar CT image reconstructions and MIPs were obtained to evaluate the vascular anatomy. RADIATION DOSE REDUCTION:  This exam was performed according to the departmental dose-optimization program which includes automated exposure control, adjustment of the mA and/or kV according to patient size and/or use of iterative reconstruction technique. CONTRAST:  13mL OMNIPAQUE IOHEXOL 350 MG/ML SOLN COMPARISON:  Only comparison is portable chest earlier today. FINDINGS: Cardiovascular: Mild panchamber cardiomegaly with slightly elevated RV/LV ratio of 1.06, IVC and hepatic vein reflux suggesting right heart strain versus tricuspid regurgitation. The pulmonary arteries are upper-normal in caliber and do not show appreciable  embolic filling defects. There are scattered three-vessel coronary artery calcifications in the LAD and circumflex. There is no pericardial effusion. The superior pulmonary veins mildly distended. There is mild aortic atherosclerosis tortuosity without aneurysm or dissection. The great vessels are not well enough opacified to evaluate. Mediastinum/Nodes: There are mildly prominent bilateral hilar lymph nodes up to 1.2 cm in short axis on the right, up to 1.1 cm in short axis on the left. There are similar mildly prominent subcarinal and precarinal mediastinal nodes. No thyroid or axillary masses seen. Trachea is patent. Lungs/Pleura: There is no pleural effusion, thickening or pneumothorax. Some respiratory motion is seen on exam. There is interstitial thickening in the bases which could be due to chronic interstitial disease, interstitial pneumonitis or mild interstitial edema, but as above there are no pleural effusions. The upper lobes show early centrilobular emphysematous changes. Central airways are small caliber there are thickened bronchial walls in the upper and lower lobes, to a lesser extent in the right middle lobe with partial mucous plugging of some of the segmental left lower lobe bronchi and a few small bronchial impactions both posterior basal lower lobes. There is a 6 mm noncalcified posterior basal right lower lobe nodule on 6:124. There are several additional bilateral scattered tiny subpleural nodules up to 3 mm in the lower lung fields. Evidence of scattered air trapping noted in the basal segments of the lower lobes but no focal pneumonia. Upper Abdomen: No acute abnormality. Musculoskeletal: There are mild degenerative changes of the thoracic spine. No concerning regional skeletal lesion. Review of the MIP images confirms the above findings. IMPRESSION: 1. Upper-normal caliber pulmonary arteries without evidence of embolus. 2. Cardiomegaly, slightly elevated RV/LV ratio and IVC and hepatic  vein reflux, which could be seen with right heart dysfunction/strain or tricuspid regurgitation. 3. There are mildly prominent superior pulmonary veins, and there is interstitial thickening in the base of the lungs which could be due to chronic change, interstitial pneumonitis or interstitial edema. There is no pleural effusion. 4. Bronchial thickening in all lobes compatible with bronchitis with additional evidence of small airways disease and small bronchial impactions in the posterior bases as well as mucoid impaction in some of the left lower lobe segmental bronchi. 5. Small caliber central airways consistent with bronchospasm or respiratory phase. 6. Evidence of air trapping in the lower lobe basal segments but no focal pneumonia. 7. Mildly prominent hilar and mediastinal nodes. No bulky or encasing adenopathy. 8. 6 mm right lower lobe nodule and a few scattered up to 3 mm subpleural interstitial micronodules in the lower lung fields. Recommend a non-contrast Chest CT at 6-12 months, then another non-contrast Chest CT at 18-24 months. These guidelines do not apply to immunocompromised patients and patients with cancer. Follow up in patients with significant comorbidities as clinically warranted. For lung cancer screening, adhere to Lung-RADS guidelines. Reference: Radiology. 2017; 284(1):228-43. 9. Aortic and coronary artery atherosclerosis. Electronically Signed   By: Telford Nab M.D.   On: 03/06/2022 07:23  CARDIAC CATHETERIZATION  Result Date: 03/09/2022 Images from the original result were not included.   1st Mrg lesion is 50% stenosed. Marcus Tran is a 71 y.o. male  [id number] LOCATION:  FACILITY: [facility] PHYSICIAN: Quay Burow, M.D. 1950-11-30 DATE OF PROCEDURE:  03/09/2022 DATE OF DISCHARGE: CARDIAC CATHETERIZATION History obtained from chart review.71 y.o. male with prior history of tobacco smoking, no regular follow-up who admitted for acute hypoxic respiratory failure with suspected underlying  COPD and heart failure exacerbation.  PROCEDURE DESCRIPTION: The patient was brought to the second floor [employer] Cardiac cath lab in the postabsorptive state. He was premedicated with IV Versed and fentanyl. His right wrist and antecubital fossa Were prepped and shaved in usual sterile fashion. Xylocaine 1% was used for local anesthesia. A 6 French sheath was inserted into the right radial artery using standard Seldinger technique.  A 5 French sheath was inserted into the right antecubital vein.  A 5 French balloontipped Swan-Ganz catheter was then advanced to the right heart chambers obtaining sequential pressures and pulmonary artery blood samples for the determination of Fick cardiac output.  A 5 [place] TIG catheter and right Judkins catheters were used for selective coronary angiography and obtaining left heart pressures.  Isovue dye is used for the entirety of the case (40 cc contrast total to patient).  Retrograde aortic, left ventricular and pullback pressures were recorded.  Radial cocktail was administered via the SideArm sheath.  The patient received 4000 units  of heparin intravenously.  HEMODYNAMICS:  1: Right atrial pressure-18/4 2: Right ventricular pressure-55/10 3: Pulmonary artery pressure-54/29, mean 37 4: Pulmonary wedge pressure-A-wave 24, V wave 25, mean 23 5: LVEDP-22 6: Cardiac output-3.8 L/min with an index of 2.1 L/min/m.   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  He still appears somewhat wet hemodynamically.  He will require additional diuresis and guideline directed optimal medical therapy.  Given his serum creatinine, I do not think he is a candidate for Entresto and/or ACE/ARB.  The radial sheath was removed and a TR band was placed on the right wrist to achieve patent hemostasis.  The patient left lab in stable condition.  Dr. Irish Lack was notified of these results. Quay Burow. MD, [facility] 03/09/2022 4:53 PM    DG Chest Port 1 View  Result  Date: 03/06/2022 CLINICAL DATA:  71 year old male with history of severe shortness of breath. EXAM: PORTABLE CHEST 1 VIEW COMPARISON:  No priors. FINDINGS: Lung volumes are normal. No consolidative airspace disease. No pleural effusions. Interstitial prominence is noted throughout the mid to lower lungs bilaterally where there is some mild peribronchial cuffing. No pneumothorax. No evidence of pulmonary edema. However, there is severe fullness of the hilar regions bilaterally, the appearance of which is favored to reflect dilated central pulmonary arteries. Heart size is mildly enlarged. Upper mediastinal contours are within normal limits. IMPRESSION: 1. Abnormal chest x-ray, without prior studies available for comparison. Follow-up chest CT should be considered to better evaluate the above findings. This could be performed as a high-resolution chest CT if there is clinical concern for interstitial lung disease, or could be performed as a PE protocol CT scan if there is any clinical concern for pulmonary embolism. Electronically Signed   By: Vinnie Langton M.D.   On: 03/06/2022 05:47   ECHOCARDIOGRAM COMPLETE  Result Date: 03/06/2022    ECHOCARDIOGRAM REPORT   Patient Name:   Marcus Tran Date of Exam: 03/06/2022 Medical Rec #:  [id number]    Height:  65.0 in Accession #:    [id number]   Weight:       156.0 lb Date of Birth:  1950/12/25    BSA:          1.780 m Patient Age:    37 years     BP:           182/131 mmHg Patient Gender: M            HR:           96 bpm. Exam Location:  Inpatient Procedure: 2D Echo, Color Doppler and Cardiac Doppler Indications:    [id number] Acute systolic (congestive) heart failure  History:        Patient has no prior history of Echocardiogram examinations.  Sonographer:    Raquel Sarna Senior RDCS Referring Phys: [phone] GRACE E BOWSER  Sonographer Comments: Scanned supine on bipap IMPRESSIONS  1. Left ventricular ejection fraction, by estimation, is 35%. The left ventricle has  moderately decreased function. The left ventricle demonstrates global hypokinesis. Left ventricular diastolic parameters are consistent with Grade II diastolic dysfunction (pseudonormalization).  2. Right ventricular systolic function is normal. The right ventricular size is normal. There is moderately elevated pulmonary artery systolic pressure. The estimated right ventricular systolic pressure is 0000000 mmHg.  3. Left atrial size was mildly dilated.  4. Right atrial size was mildly dilated.  5. The mitral valve is degenerative. Trivial mitral valve regurgitation. No evidence of mitral stenosis. Moderate mitral annular calcification.  6. The aortic valve is tricuspid. There is mild calcification of the aortic valve. Aortic valve regurgitation is not visualized. No aortic stenosis is present.  7. The inferior vena cava is normal in size with <50% respiratory variability, suggesting right atrial pressure of 8 mmHg. FINDINGS  Left Ventricle: Left ventricular ejection fraction, by estimation, is 35%. The left ventricle has moderately decreased function. The left ventricle demonstrates global hypokinesis. The left ventricular internal cavity size was normal in size. There is no left ventricular hypertrophy. Left ventricular diastolic parameters are consistent with Grade II diastolic dysfunction (pseudonormalization). Right Ventricle: The right ventricular size is normal. No increase in right ventricular wall thickness. Right ventricular systolic function is normal. There is moderately elevated pulmonary artery systolic pressure. The tricuspid regurgitant velocity is 3.39 m/s, and with an assumed right atrial pressure of 8 mmHg, the estimated right ventricular systolic pressure is 0000000 mmHg. Left Atrium: Left atrial size was mildly dilated. Right Atrium: Right atrial size was mildly dilated. Pericardium: There is no evidence of pericardial effusion. Mitral Valve: The mitral valve is degenerative in appearance. There is  mild calcification of the mitral valve leaflet(s). Moderate mitral annular calcification. Trivial mitral valve regurgitation. No evidence of mitral valve stenosis. Tricuspid Valve: The tricuspid valve is normal in structure. Tricuspid valve regurgitation is mild. Aortic Valve: The aortic valve is tricuspid. There is mild calcification of the aortic valve. Aortic valve regurgitation is not visualized. No aortic stenosis is present. Pulmonic Valve: The pulmonic valve was normal in structure. Pulmonic valve regurgitation is not visualized. Aorta: The aortic root is normal in size and structure. Venous: The inferior vena cava is normal in size with less than 50% respiratory variability, suggesting right atrial pressure of 8 mmHg. IAS/Shunts: No atrial level shunt detected by color flow Doppler.  LEFT VENTRICLE PLAX 2D LVIDd:         5.40 cm     Diastology LVIDs:         4.80 cm     LV  e' medial:    4.43 cm/s LV PW:         1.00 cm     LV E/e' medial:  15.7 LV IVS:        0.70 cm     LV e' lateral:   8.85 cm/s LVOT diam:     2.10 cm     LV E/e' lateral: 7.9 LV SV:         50 LV SV Index:   28 LVOT Area:     3.46 cm  LV Volumes (MOD) LV vol d, MOD A2C: 96.4 ml LV vol d, MOD A4C: 94.1 ml LV vol s, MOD A2C: 68.5 ml LV vol s, MOD A4C: 58.6 ml LV SV MOD A2C:     27.9 ml LV SV MOD A4C:     94.1 ml LV SV MOD BP:      31.1 ml RIGHT VENTRICLE RV S prime:     13.20 cm/s TAPSE (M-mode): 2.1 cm LEFT ATRIUM             Index        RIGHT ATRIUM           Index LA diam:        3.90 cm 2.19 cm/m   RA Area:     20.40 cm LA Vol (A2C):   49.6 ml 27.87 ml/m  RA Volume:   64.40 ml  36.18 ml/m LA Vol (A4C):   49.8 ml 27.98 ml/m LA Biplane Vol: 49.9 ml 28.04 ml/m  AORTIC VALVE LVOT Vmax:   85.10 cm/s LVOT Vmean:  62.100 cm/s LVOT VTI:    0.145 m  AORTA Ao Root diam: 3.30 cm MITRAL VALVE               TRICUSPID VALVE MV Area (PHT): 4.04 cm    TR Peak grad:   46.0 mmHg MV Decel Time: 188 msec    TR Vmax:        339.00 cm/s MV E  velocity: 69.50 cm/s MV A velocity: 50.70 cm/s  SHUNTS MV E/A ratio:  1.37        Systemic VTI:  0.14 m                            Systemic Diam: 2.10 cm Dalton McleanMD Electronically signed by Franki Monte Signature Date/Time: 03/06/2022/6:13:37 PM    Final       Subjective: Patient was seen and examined at bedside.  Overnight events noted.   Patient reports feeling much improved and want to be discharged.  Patient is being discharged  Discharge Exam: Vitals:   03/11/22 0817 03/11/22 1439  BP: 130/85 115/83  Pulse: 84   Resp: 18   Temp: 97.8 F (36.6 C)   SpO2: 92% 92%   Vitals:   03/11/22 0416 03/11/22 0731 03/11/22 0817 03/11/22 1439  BP: 131/90  130/85 115/83  Pulse: 80  84   Resp:   18   Temp: 97.9 F (36.6 C)  97.8 F (36.6 C)   TempSrc: Oral  Oral   SpO2: 91% 92% 92% 92%  Weight:      Height:        General: Pt is alert, awake, not in acute distress Cardiovascular: RRR, S1/S2 +, no rubs, no gallops Respiratory: CTA bilaterally, no wheezing, no rhonchi Abdominal: Soft, NT, ND, bowel sounds + Extremities: no edema, no cyanosis    The results of significant diagnostics  from this hospitalization (including imaging, microbiology, ancillary and laboratory) are listed below for reference.     Microbiology: Recent Results (from the past 240 hour(s))  Resp Panel by RT-PCR (Flu A&B, Covid) Nasopharyngeal Swab     Status: None   Collection Time: 03/06/22  5:22 AM   Specimen: Nasopharyngeal Swab; Nasopharyngeal(NP) swabs in vial transport medium  Result Value Ref Range Status   SARS Coronavirus 2 by RT PCR NEGATIVE NEGATIVE Final    Comment: (NOTE) SARS-CoV-2 target nucleic acids are NOT DETECTED.  The SARS-CoV-2 RNA is generally detectable in upper respiratory specimens during the acute phase of infection. The lowest concentration of SARS-CoV-2 viral copies this assay can detect is 138 copies/mL. A negative result does not preclude SARS-Cov-2 infection and  should not be used as the sole basis for treatment or other patient management decisions. A negative result may occur with  improper specimen collection/handling, submission of specimen other than nasopharyngeal swab, presence of viral mutation(s) within the areas targeted by this assay, and inadequate number of viral copies(<138 copies/mL). A negative result must be combined with clinical observations, patient history, and epidemiological information. The expected result is Negative.  Fact Sheet for Patients:  EntrepreneurPulse.com.au  Fact Sheet for Healthcare Providers:  IncredibleEmployment.be  This test is no t yet approved or cleared by the [place] FDA and  has been authorized for detection and/or diagnosis of SARS-CoV-2 by FDA under an Emergency Use Authorization (EUA). This EUA will remain  in effect (meaning this test can be used) for the duration of the COVID-19 declaration under Section 564(b)(1) of the Act, 21 U.S.C.section 360bbb-3(b)(1), unless the authorization is terminated  or revoked sooner.       Influenza A by PCR NEGATIVE NEGATIVE Final   Influenza B by PCR NEGATIVE NEGATIVE Final    Comment: (NOTE) The Xpert Xpress SARS-CoV-2/FLU/RSV plus assay is intended as an aid in the diagnosis of influenza from Nasopharyngeal swab specimens and should not be used as a sole basis for treatment. Nasal washings and aspirates are unacceptable for Xpert Xpress SARS-CoV-2/FLU/RSV testing.  Fact Sheet for Patients: EntrepreneurPulse.com.au  Fact Sheet for Healthcare Providers: IncredibleEmployment.be  This test is not yet approved or cleared by the [place] FDA and has been authorized for detection and/or diagnosis of SARS-CoV-2 by FDA under an Emergency Use Authorization (EUA). This EUA will remain in effect (meaning this test can be used) for the duration of the COVID-19 declaration  under Section 564(b)(1) of the Act, 21 U.S.C. section 360bbb-3(b)(1), unless the authorization is terminated or revoked.  Performed at [facility], [place] [address] [place], [place] [address]   Culture, blood (Routine X 2) w Reflex to ID Panel     Status: None   Collection Time: 03/06/22  8:28 AM   Specimen: BLOOD  Result Value Ref Range Status   Specimen Description BLOOD SITE NOT SPECIFIED  Final   Special Requests   Final    BOTTLES DRAWN AEROBIC AND ANAEROBIC Blood Culture adequate volume   Culture   Final    NO GROWTH 5 DAYS Performed at [facility], [address], [place], [place] [address]    Report Status 03/11/2022 FINAL  Final  Respiratory (~20 pathogens) panel by PCR     Status: None   Collection Time: 03/06/22  8:39 AM   Specimen: Nasopharyngeal Swab; Respiratory  Result Value Ref Range Status   Adenovirus NOT DETECTED NOT DETECTED Final   Coronavirus 229E NOT DETECTED NOT  DETECTED Final    Comment: (NOTE) The Coronavirus on the Respiratory Panel, DOES NOT test for the novel  Coronavirus (2019 nCoV)    Coronavirus HKU1 NOT DETECTED NOT DETECTED Final   Coronavirus NL63 NOT DETECTED NOT DETECTED Final   Coronavirus OC43 NOT DETECTED NOT DETECTED Final   Metapneumovirus NOT DETECTED NOT DETECTED Final   Rhinovirus / Enterovirus NOT DETECTED NOT DETECTED Final   Influenza A NOT DETECTED NOT DETECTED Final   Influenza B NOT DETECTED NOT DETECTED Final   Parainfluenza Virus 1 NOT DETECTED NOT DETECTED Final   Parainfluenza Virus 2 NOT DETECTED NOT DETECTED Final   Parainfluenza Virus 3 NOT DETECTED NOT DETECTED Final   Parainfluenza Virus 4 NOT DETECTED NOT DETECTED Final   Respiratory Syncytial Virus NOT DETECTED NOT DETECTED Final   Bordetella pertussis NOT DETECTED NOT DETECTED Final   Bordetella Parapertussis NOT DETECTED NOT DETECTED Final   Chlamydophila pneumoniae NOT DETECTED NOT DETECTED Final   Mycoplasma pneumoniae NOT DETECTED NOT DETECTED  Final    Comment: Performed at [facility], [place] [address] [place], [place] [address]  Culture, blood (Routine X 2) w Reflex to ID Panel     Status: None   Collection Time: 03/06/22  9:02 AM   Specimen: BLOOD  Result Value Ref Range Status   Specimen Description BLOOD SITE NOT SPECIFIED  Final   Special Requests   Final    BOTTLES DRAWN AEROBIC AND ANAEROBIC Blood Culture results may not be optimal due to an inadequate volume of blood received in culture bottles   Culture   Final    NO GROWTH 5 DAYS Performed at [facility], [place] [address], [place], [place] [address]    Report Status 03/11/2022 FINAL  Final  MRSA Next Gen by PCR, Nasal     Status: None   Collection Time: 03/06/22 10:33 AM   Specimen: Nasal Mucosa; Nasal Swab  Result Value Ref Range Status   MRSA by PCR Next Gen NOT DETECTED NOT DETECTED Final    Comment: (NOTE) The GeneXpert MRSA Assay (FDA approved for NASAL specimens only), is one component of a comprehensive MRSA colonization surveillance program. It is not intended to diagnose MRSA infection nor to guide or monitor treatment for MRSA infections. Test performance is not FDA approved in patients less than 49 years old. Performed at [facility], [place] [address] [place], [place] [address]      Labs: BNP (last 3 results) Recent Labs    03/06/22 0503  BNP [id number]*   Basic Metabolic Panel: Recent Labs  Lab 03/06/22 1841 03/06/22 1912 03/07/22 1422 03/08/22 0044 03/09/22 0654 03/09/22 1633 03/10/22 0234 03/11/22 0145  NA 139   < > 134* 131* 134* 135 133* 131*  K 3.7   < > 4.2 4.4 3.9 4.6 4.6 4.4  CL 96*   < > 99 100 100  --  98 96*  CO2 29   < > 27 26 29   --  27 26  GLUCOSE 231*   < > 130* 193* 114*  --  144* 142*  BUN 18   < > 23 25* 21  --  27* 25*  CREATININE 1.67*   < > 1.55* 1.66* 1.53*  --  1.28* 1.31*  CALCIUM 8.7*   < > 8.7* 8.2* 8.4*  --  8.3* 8.6*  MG 2.5*  --   --  2.3  --   --  2.0 2.0  PHOS 3.7  --   --   --   --    --   --   --    < > =  values in this interval not displayed.   Liver Function Tests: No results for input(s): AST, ALT, ALKPHOS, BILITOT, PROT, ALBUMIN in the last 168 hours. No results for input(s): LIPASE, AMYLASE in the last 168 hours. No results for input(s): AMMONIA in the last 168 hours. CBC: Recent Labs  Lab 03/06/22 0503 03/06/22 0543 03/07/22 [id number] 03/08/22 0044 03/09/22 0654 03/09/22 1633 03/10/22 0234 03/11/22 0145  WBC 8.0  --  16.7* 19.2* 13.3*  --  11.1* 14.8*  NEUTROABS 4.1  --   --   --   --   --   --   --   HGB 18.6*   < > 16.5 15.6 14.9 17.0 15.1 16.9  HCT 57.4*   < > 51.0 46.7 46.8 50.0 45.3 50.0  MCV 92.4  --  90.7 90.7 91.9  --  89.9 87.9  PLT 179  --  179 177 177  --  174 211   < > = values in this interval not displayed.   Cardiac Enzymes: No results for input(s): CKTOTAL, CKMB, CKMBINDEX, TROPONINI in the last 168 hours. BNP: Invalid input(s): POCBNP CBG: Recent Labs  Lab 03/10/22 1203 03/10/22 1638 03/10/22 2127 03/11/22 0807 03/11/22 1124  GLUCAP 160* 220* 228* 218* 167*   D-Dimer No results for input(s): DDIMER in the last 72 hours. Hgb A1c No results for input(s): HGBA1C in the last 72 hours. Lipid Profile No results for input(s): CHOL, HDL, LDLCALC, TRIG, CHOLHDL, LDLDIRECT in the last 72 hours. Thyroid function studies No results for input(s): TSH, T4TOTAL, T3FREE, THYROIDAB in the last 72 hours.  Invalid input(s): FREET3 Anemia work up No results for input(s): VITAMINB12, FOLATE, FERRITIN, TIBC, IRON, RETICCTPCT in the last 72 hours. Urinalysis    Component Value Date/Time   COLORURINE YELLOW 03/06/2022 1011   APPEARANCEUR CLEAR 03/06/2022 1011   LABSPEC 1.029 03/06/2022 1011   PHURINE 5.0 03/06/2022 1011   GLUCOSEU NEGATIVE 03/06/2022 1011   HGBUR SMALL (A) 03/06/2022 [place] 03/06/2022 [place] 03/06/2022 1011   PROTEINUR 100 (A) 03/06/2022 1011   NITRITE NEGATIVE 03/06/2022 [place] 03/06/2022 1011   Sepsis Labs Invalid input(s): PROCALCITONIN,  WBC,  LACTICIDVEN Microbiology Recent Results (from the past 240 hour(s))  Resp Panel by RT-PCR (Flu A&B, Covid) Nasopharyngeal Swab     Status: None   Collection Time: 03/06/22  5:22 AM   Specimen: Nasopharyngeal Swab; Nasopharyngeal(NP) swabs in vial transport medium  Result Value Ref Range Status   SARS Coronavirus 2 by RT PCR NEGATIVE NEGATIVE Final    Comment: (NOTE) SARS-CoV-2 target nucleic acids are NOT DETECTED.  The SARS-CoV-2 RNA is generally detectable in upper respiratory specimens during the acute phase of infection. The lowest concentration of SARS-CoV-2 viral copies this assay can detect is 138 copies/mL. A negative result does not preclude SARS-Cov-2 infection and should not be used as the sole basis for treatment or other patient management decisions. A negative result may occur with  improper specimen collection/handling, submission of specimen other than nasopharyngeal swab, presence of viral mutation(s) within the areas targeted by this assay, and inadequate number of viral copies(<138 copies/mL). A negative result must be combined with clinical observations, patient history, and epidemiological information. The expected result is Negative.  Fact Sheet for Patients:  EntrepreneurPulse.com.au  Fact Sheet for Healthcare Providers:  IncredibleEmployment.be  This test is no t yet approved or cleared by the [place] FDA and  has been authorized for detection and/or  diagnosis of SARS-CoV-2 by FDA under an Emergency Use Authorization (EUA). This EUA will remain  in effect (meaning this test can be used) for the duration of the COVID-19 declaration under Section 564(b)(1) of the Act, 21 U.S.C.section 360bbb-3(b)(1), unless the authorization is terminated  or revoked sooner.       Influenza A by PCR NEGATIVE NEGATIVE Final   Influenza B  by PCR NEGATIVE NEGATIVE Final    Comment: (NOTE) The Xpert Xpress SARS-CoV-2/FLU/RSV plus assay is intended as an aid in the diagnosis of influenza from Nasopharyngeal swab specimens and should not be used as a sole basis for treatment. Nasal washings and aspirates are unacceptable for Xpert Xpress SARS-CoV-2/FLU/RSV testing.  Fact Sheet for Patients: BloggerCourse.com  Fact Sheet for Healthcare Providers: SeriousBroker.it  This test is not yet approved or cleared by the [place] FDA and has been authorized for detection and/or diagnosis of SARS-CoV-2 by FDA under an Emergency Use Authorization (EUA). This EUA will remain in effect (meaning this test can be used) for the duration of the COVID-19 declaration under Section 564(b)(1) of the Act, 21 U.S.C. section 360bbb-3(b)(1), unless the authorization is terminated or revoked.  Performed at [facility], [address] [place], [place] [address]   Culture, blood (Routine X 2) w Reflex to ID Panel     Status: None   Collection Time: 03/06/22  8:28 AM   Specimen: BLOOD  Result Value Ref Range Status   Specimen Description BLOOD SITE NOT SPECIFIED  Final   Special Requests   Final    BOTTLES DRAWN AEROBIC AND ANAEROBIC Blood Culture adequate volume   Culture   Final    NO GROWTH 5 DAYS Performed at [facility], [address], [place], [place] [address]    Report Status 03/11/2022 FINAL  Final  Respiratory (~20 pathogens) panel by PCR     Status: None   Collection Time: 03/06/22  8:39 AM   Specimen: Nasopharyngeal Swab; Respiratory  Result Value Ref Range Status   Adenovirus NOT DETECTED NOT DETECTED Final   Coronavirus 229E NOT DETECTED NOT DETECTED Final    Comment: (NOTE) The Coronavirus on the Respiratory Panel, DOES NOT test for the novel  Coronavirus (2019 nCoV)    Coronavirus HKU1 NOT DETECTED NOT DETECTED Final   Coronavirus NL63 NOT DETECTED NOT  DETECTED Final   Coronavirus OC43 NOT DETECTED NOT DETECTED Final   Metapneumovirus NOT DETECTED NOT DETECTED Final   Rhinovirus / Enterovirus NOT DETECTED NOT DETECTED Final   Influenza A NOT DETECTED NOT DETECTED Final   Influenza B NOT DETECTED NOT DETECTED Final   Parainfluenza Virus 1 NOT DETECTED NOT DETECTED Final   Parainfluenza Virus 2 NOT DETECTED NOT DETECTED Final   Parainfluenza Virus 3 NOT DETECTED NOT DETECTED Final   Parainfluenza Virus 4 NOT DETECTED NOT DETECTED Final   Respiratory Syncytial Virus NOT DETECTED NOT DETECTED Final   Bordetella pertussis NOT DETECTED NOT DETECTED Final   Bordetella Parapertussis NOT DETECTED NOT DETECTED Final   Chlamydophila pneumoniae NOT DETECTED NOT DETECTED Final   Mycoplasma pneumoniae NOT DETECTED NOT DETECTED Final    Comment: Performed at [facility], [address] [place], [place] [address]  Culture, blood (Routine X 2) w Reflex to ID Panel     Status: None   Collection Time: 03/06/22  9:02 AM   Specimen: BLOOD  Result Value Ref Range Status   Specimen Description BLOOD SITE NOT SPECIFIED  Final  Special Requests   Final    BOTTLES DRAWN AEROBIC AND ANAEROBIC Blood Culture results may not be optimal due to an inadequate volume of blood received in culture bottles   Culture   Final    NO GROWTH 5 DAYS Performed at [facility], [place] [address], [place], [place] [address]    Report Status 03/11/2022 FINAL  Final  MRSA Next Gen by PCR, Nasal     Status: None   Collection Time: 03/06/22 10:33 AM   Specimen: Nasal Mucosa; Nasal Swab  Result Value Ref Range Status   MRSA by PCR Next Gen NOT DETECTED NOT DETECTED Final    Comment: (NOTE) The GeneXpert MRSA Assay (FDA approved for NASAL specimens only), is one component of a comprehensive MRSA colonization surveillance program. It is not intended to diagnose MRSA infection nor to guide or monitor treatment for MRSA infections. Test performance is not FDA approved  in patients less than 49 years old. Performed at [facility], [place] [address] [place], [place] [address]      Time coordinating discharge: Over 30 minutes  SIGNED:   Shawna Clamp, MD  [facility] 03/11/2022, 4:25 PM Pager   If 7PM-7AM, please contact night-coverage

## 2022-03-11 NOTE — Progress Notes (Signed)
Heart Failure Nurse Navigator Progress Note  PCP: Cipriano Mile, NP PCP-Cardiologist: [place] Admission Diagnosis: Acute respiratory Distress Admitted from: Home via EMS  Presentation:   Marcus Tran presented with shortness of breath, current smoker, oxygen sats 67% placed on non re-breather, BP 189/133, tachycardia, BNP 1,211, Troponin 53. Started on Bi-pap. Both hydralazine and lasix given IV, weaned to 4 L oxygen and admitted.   Education was done with patient on the signs and symptoms of heart failure, importance of daily weights (given a scale at discharge) when to call his doctor or go to the ER. We discussed diet/fluid restrictions, he currently lives with his daughter and her kids, they share cooking, he states he does use salt and will need to cut down on it.,spoke about medication compliance, educated on how to use the provided pill box that he was given at discharge, the importance of going to all his follow up appointments, and working towards his goal of stop smoking, he still currently smokes a 1/2 pack per day. Patient stated he can read and write some, however he understood what we talked about. A hospital follow up at the HF TOC was made for 03/23/22 @ 12 noon.   ECHO/ LVEF: 35%   Clinical Course:  Past Medical History:  Diagnosis Date   Dyslipidemia      Social History   Socioeconomic History   Marital status: Single    Spouse name: Not on file   Number of children: 1   Years of education: Not on file   Highest education level: 10th grade  Occupational History   Occupation: Disability  Tobacco Use   Smoking status: Every Day    Packs/day: 0.50    Years: 50.00    Pack years: 25.00    Types: Cigarettes   Smokeless tobacco: Not on file   Tobacco comments:    Smoking cessation  Vaping Use   Vaping Use: Never used  Substance and Sexual Activity   Alcohol use: Not on file    Comment: socially   Drug use: Yes    Types: Marijuana    Comment: past   Sexual  activity: Not on file  Other Topics Concern   Not on file  Social History Narrative   He lives with his daughter apparently and 5 grandchildren.  He reports that he smokes probably less than a pack of cigarettes a day.  He occasionally drinks a beer or liquor but not daily.   Social Determinants of Health   Financial Resource Strain: Low Risk    Difficulty of Paying Living Expenses: Not very hard  Food Insecurity: No Food Insecurity   Worried About Charity fundraiser in the Last Year: Never true   Ran Out of Food in the Last Year: Never true  Transportation Needs: No Transportation Needs   Lack of Transportation (Medical): No   Lack of Transportation (Non-Medical): No  Physical Activity: Not on file  Stress: Not on file  Social Connections: Not on file   Education Assessment and Provision:  Detailed education and instructions provided on heart failure disease management including the following:  Signs and symptoms of Heart Failure When to call the physician Importance of daily weights Low sodium diet Fluid restriction Medication management Anticipated future follow-up appointments  Patient education given on each of the above topics.  Patient acknowledges understanding via teach back method and acceptance of all instructions.  Education Materials:  "Living Better With Heart Failure" Booklet, HF zone tool, & Daily  Weight Tracker Tool.  Patient has scale at home: No, provided patient a scale at discharge Patient has pill box at home: No, provided patient a pill box at discharge    High Risk Criteria for Readmission and/or Poor Patient Outcomes: Heart failure hospital admissions (last 6 months): 0  No Show rate: 25 % Difficult social situation: No Demonstrates medication adherence: No Primary Language: english Literacy level: basic reading, wring and comprehension  Barriers of Care:   Medication compliance Diet/ fluid restrictions Smoking cessation Daily  weights  Considerations/Referrals:   Referral made to Heart Failure Pharmacist Stewardship: yes Referral made to Heart Failure CSW/NCM TOC: no needs Referral made to Heart & Vascular TOC clinic: yes, 03/23/22 @ 12 noon  Items for Follow-up on DC/TOC: Medication compliance Daily weights (gave a scale/pillbox @ d'c) Diet/ fluid restrictions Smoking cessation   Earnestine Leys, BSN, RN Heart Failure Transport planner Only

## 2022-03-11 NOTE — Discharge Instructions (Signed)
Advised to follow-up with primary care physician in 1 week. Advised to follow-up with cardiology as scheduled. Advised to take Lasix 20 mg daily. Advised to take Entresto, metoprolol, Wilder Glade as prescribed

## 2022-03-11 NOTE — TOC Benefit Eligibility Note (Signed)
Patient Advocate Encounter  Insurance verification completed.    The patient is currently admitted and upon discharge could be taking isosorbide-hydralazine (Bidil) 20-37.5 mg.  The current 30 day co-pay is, $0.00.   The patient is insured through [employer] Medicare Part D     Shatoria Stooksbury, CPhT Pharmacy Patient Advocate Specialist [facility] Patient Advocate Team Direct Number: [phone]  Fax: [phone]        

## 2022-03-11 NOTE — Progress Notes (Addendum)
Progress Note  Patient Name: Marcus Tran Date of Encounter: 03/11/2022  [facility] Cardiologist: Minus Breeding, MD   Subjective   No complaints, wants to go home soon. Ambulating in the room  Inpatient Medications    Scheduled Meds:  aspirin  81 mg Oral Daily   atorvastatin  40 mg Oral QHS   chlorhexidine  15 mL Mouth Rinse BID   dapagliflozin propanediol  10 mg Oral Daily   furosemide  20 mg Oral Daily   guaiFENesin  600 mg Oral BID   hydrALAZINE  25 mg Oral Q8H   insulin aspart  0-15 Units Subcutaneous TID WC   insulin aspart  0-5 Units Subcutaneous QHS   isosorbide mononitrate  30 mg Oral Daily   mouth rinse  15 mL Mouth Rinse q12n4p   metoprolol succinate  25 mg Oral Daily   pantoprazole  40 mg Oral QHS   predniSONE  40 mg Oral Q breakfast   sacubitril-valsartan  1 tablet Oral BID   sodium chloride flush  3 mL Intravenous Q12H   sodium chloride flush  3 mL Intravenous Q12H   umeclidinium-vilanterol  1 puff Inhalation Daily   Continuous Infusions:  sodium chloride Stopped (03/07/22 1735)   sodium chloride     PRN Meds: sodium chloride, sodium chloride, acetaminophen, albuterol, docusate sodium, hydrALAZINE, ipratropium-albuterol, morphine injection, nitroGLYCERIN, ondansetron (ZOFRAN) IV, polyethylene glycol, sodium chloride flush   Vital Signs    Vitals:   03/10/22 1448 03/10/22 2038 03/11/22 0416 03/11/22 0731  BP: (!) 153/109 (!) 150/106 131/90   Pulse:  87 80   Resp:  18    Temp:  98.1 F (36.7 C) 97.9 F (36.6 C)   TempSrc:  Oral Oral   SpO2:  98% 91% 92%  Weight:      Height:        Intake/Output Summary (Last 24 hours) at 03/11/2022 0751 Last data filed at 03/11/2022 [id number] Gross per 24 hour  Intake 1560 ml  Output 5625 ml  Net -4065 ml      03/10/2022    4:30 AM 03/09/2022    4:45 AM 03/08/2022    4:15 AM  Last 3 Weights  Weight (lbs) 164 lb 162 lb 3.2 oz 160 lb 4.8 oz  Weight (kg) 74.39 kg 73.573 kg 72.712 kg      Telemetry     Sinus Rhythm, PVCs, NSVT - Personally Reviewed  ECG    No new tracing  Physical Exam   GEN: No acute distress.   Neck: No JVD Cardiac: RRR, no murmurs, rubs, or gallops.  Respiratory: Clear to auscultation bilaterally. GI: Soft, nontender, non-distended  MS: No edema; No deformity. Right radial stable  Neuro:  Nonfocal  Psych: Normal affect   Labs    High Sensitivity Troponin:   Recent Labs  Lab 03/06/22 0503 03/06/22 0737 03/06/22 1841 03/06/22 2155  TROPONINIHS 53* 46* 45* 46*     Chemistry Recent Labs  Lab 03/06/22 1841 03/06/22 1912 03/08/22 0044 03/09/22 0654 03/09/22 1633 03/10/22 0234 03/11/22 0145  NA 139   < > 131* 134* 135 133* 131*  K 3.7   < > 4.4 3.9 4.6 4.6 4.4  CL 96*   < > 100 100  --  98 96*  CO2 29   < > 26 29  --  27 26  GLUCOSE 231*   < > 193* 114*  --  144* 142*  BUN 18   < > 25* 21  --  27* 25*  CREATININE 1.67*   < > 1.66* 1.53*  --  1.28* 1.31*  CALCIUM 8.7*   < > 8.2* 8.4*  --  8.3* 8.6*  MG 2.5*  --  2.3  --   --  2.0  --   GFRNONAA 43*   < > 44* 48*  --  60* 58*  ANIONGAP 14   < > 5 5  --  8 9   < > = values in this interval not displayed.    Lipids No results for input(s): CHOL, TRIG, HDL, LABVLDL, LDLCALC, CHOLHDL in the last 168 hours.  Hematology Recent Labs  Lab 03/09/22 0654 03/09/22 1633 03/10/22 0234 03/11/22 0145  WBC 13.3*  --  11.1* 14.8*  RBC 5.09  --  5.04 5.69  HGB 14.9 17.0 15.1 16.9  HCT 46.8 50.0 45.3 50.0  MCV 91.9  --  89.9 87.9  MCH 29.3  --  30.0 29.7  MCHC 31.8  --  33.3 33.8  RDW 15.1  --  15.3 14.8  PLT 177  --  174 211   Thyroid No results for input(s): TSH, FREET4 in the last 168 hours.  BNP Recent Labs  Lab 03/06/22 0503  BNP 1,211.4*    DDimer No results for input(s): DDIMER in the last 168 hours.   Radiology    CARDIAC CATHETERIZATION  Result Date: 03/09/2022 Images from the original result were not included.   1st Mrg lesion is 50% stenosed. Marcus Tran is a 71 y.o. male   ZF:[id number] LOCATION:  FACILITY: [facility] PHYSICIAN: Marcus Tran, M.D. 12-09-1950 DATE OF PROCEDURE:  03/09/2022 DATE OF DISCHARGE: CARDIAC CATHETERIZATION History obtained from chart review.71 y.o. male with prior history of tobacco smoking, no regular follow-up who admitted for acute hypoxic respiratory failure with suspected underlying COPD and heart failure exacerbation.  PROCEDURE DESCRIPTION: The patient was brought to the second floor [facility]iac cath lab in the postabsorptive state. He was premedicated with IV Versed and fentanyl. His right wrist and antecubital fossa Were prepped and shaved in usual sterile fashion. Xylocaine 1% was used for local anesthesia. A 6 French sheath was inserted into the right radial artery using standard Seldinger technique.  A 5 French sheath was inserted into the right antecubital vein.  A 5 French balloontipped Swan-Ganz catheter was then advanced to the right heart chambers obtaining sequential pressures and pulmonary artery blood samples for the determination of Fick cardiac output.  A 5 [place] TIG catheter and right Judkins catheters were used for selective coronary angiography and obtaining left heart pressures.  Isovue dye is used for the entirety of the case (40 cc contrast total to patient).  Retrograde aortic, left ventricular and pullback pressures were recorded.  Radial cocktail was administered via the SideArm sheath.  The patient received 4000 units  of heparin intravenously.  HEMODYNAMICS:  1: Right atrial pressure-18/4 2: Right ventricular pressure-55/10 3: Pulmonary artery pressure-54/29, mean 37 4: Pulmonary wedge pressure-A-wave 24, V wave 25, mean 23 5: LVEDP-22 6: Cardiac output-3.8 L/min with an index of 2.1 L/min/m.   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  He still appears somewhat wet hemodynamically.  He will require additional diuresis and guideline directed optimal medical therapy.  Given his serum  creatinine, I do not think he is a candidate for Entresto and/or ACE/ARB.  The radial sheath was removed and a TR band was placed on the right wrist to achieve patent hemostasis.  The patient left  lab in stable condition.  Dr. Irish Lack was notified of these results. Marcus Tran. MD, [facility] 03/09/2022 4:53 PM     Cardiac Studies   Echo: 03/06/22  IMPRESSIONS     1. Left ventricular ejection fraction, by estimation, is 35%. The left  ventricle has moderately decreased function. The left ventricle  demonstrates global hypokinesis. Left ventricular diastolic parameters are  consistent with Grade II diastolic  dysfunction (pseudonormalization).   2. Right ventricular systolic function is normal. The right ventricular  size is normal. There is moderately elevated pulmonary artery systolic  pressure. The estimated right ventricular systolic pressure is 0000000 mmHg.   3. Left atrial size was mildly dilated.   4. Right atrial size was mildly dilated.   5. The mitral valve is degenerative. Trivial mitral valve regurgitation.  No evidence of mitral stenosis. Moderate mitral annular calcification.   6. The aortic valve is tricuspid. There is mild calcification of the  aortic valve. Aortic valve regurgitation is not visualized. No aortic  stenosis is present.   7. The inferior vena cava is normal in size with <50% respiratory  variability, suggesting right atrial pressure of 8 mmHg.   FINDINGS   Left Ventricle: Left ventricular ejection fraction, by estimation, is  35%. The left ventricle has moderately decreased function. The left  ventricle demonstrates global hypokinesis. The left ventricular internal  cavity size was normal in size. There is  no left ventricular hypertrophy. Left ventricular diastolic parameters are  consistent with Grade II diastolic dysfunction (pseudonormalization).   Right Ventricle: The right ventricular size is normal. No increase in  right ventricular wall thickness.  Right ventricular systolic function is  normal. There is moderately elevated pulmonary artery systolic pressure.  The tricuspid regurgitant velocity is  3.39 m/s, and with an assumed right atrial pressure of 8 mmHg, the  estimated right ventricular systolic pressure is 0000000 mmHg.   Left Atrium: Left atrial size was mildly dilated.   Right Atrium: Right atrial size was mildly dilated.   Pericardium: There is no evidence of pericardial effusion.   Mitral Valve: The mitral valve is degenerative in appearance. There is  mild calcification of the mitral valve leaflet(s). Moderate mitral annular  calcification. Trivial mitral valve regurgitation. No evidence of mitral  valve stenosis.   Tricuspid Valve: The tricuspid valve is normal in structure. Tricuspid  valve regurgitation is mild.   Aortic Valve: The aortic valve is tricuspid. There is mild calcification  of the aortic valve. Aortic valve regurgitation is not visualized. No  aortic stenosis is present.   Pulmonic Valve: The pulmonic valve was normal in structure. Pulmonic valve  regurgitation is not visualized.   Aorta: The aortic root is normal in size and structure.   Venous: The inferior vena cava is normal in size with less than 50%  respiratory variability, suggesting right atrial pressure of 8 mmHg.   IAS/Shunts: No atrial level shunt detected by color flow Doppler.   Cath: 03/09/22  HEMODYNAMICS:    1: Right atrial pressure-18/4 2: Right ventricular pressure-55/10 3: Pulmonary artery pressure-54/29, mean 37 4: Pulmonary wedge pressure-A-wave 24, V wave 25, mean 23 5: LVEDP-22 6: Cardiac output-3.8 L/min with an index of 2.1 L/min/m.   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  He still appears somewhat wet hemodynamically.  He will require additional diuresis and guideline directed optimal medical therapy.  Given his serum creatinine, I do not think he is a candidate for  Entresto and/or ACE/ARB.  The radial sheath was removed and a TR band was placed on the right wrist to achieve patent hemostasis.  The patient left lab in stable condition.  Dr. Irish Lack was notified of these results.   Marcus Tran. MD, [facility] 03/09/2022 4:53 PM  Patient Profile     71 y.o. male with prior history of tobacco smoking, no regular follow-up who admitted for acute hypoxic respiratory failure with suspected underlying COPD and heart failure exacerbation.  Assessment & Plan    HFrEF/NICM: Echocardiogram showed LV function of [id number], grade 2 diastolic dysfunction.  RVSP 54 mmHg. Cardiac cath showed 50% 1st OM lesion but elevated filling pressures. Diuresed with IV lasix, net - 9.9L. Now transitioned to PO lasix 20mg  daily -- continue Toprol 25mg  daily, Entresto 24-26mg  BID, hydralazine 25mg  TID, Imdur 30mg  daily (could consider transition to BiDil if feasible), farxiga 10mg  daily -- will message for follow up in the [facility] as well as gen cards follow up  CKD stage III: 1.67>>1.5>>1.2>>1.31 -- stable at 1.3  HTN: stable  -- continue Toprol 25mg  daily, Entresto 24-26mg  BID, hydralazine 25mg  TID, Imdur 30mg  daily  CAD: mild on cath with 50% OM lesion -- continue ASA and statin  HLD: on crestor 40mg  daily   DM: Hgb A1c 6.9 -- on farxiga  -- SSI    For questions or updates, please contact [facility] Please consult [email or web address] for contact info under        Signed, Reino Bellis, NP  03/11/2022, 7:51 AM    I have examined the patient and reviewed assessment and plan and discussed with patient.  Agree with above as stated.   Medical therapy of NICM.  Hs diuresed well.  Changing to PO Lasix.  May need the flexiblity to take 40 mg daily of Lasix when he feels more volume overloaded.   Aggressive secondary prevention.   Marcus Tran

## 2022-03-20 ENCOUNTER — Encounter (HOSPITAL_COMMUNITY): Payer: Self-pay

## 2022-03-20 ENCOUNTER — Emergency Department (HOSPITAL_COMMUNITY): Payer: Medicare Other

## 2022-03-20 ENCOUNTER — Inpatient Hospital Stay (HOSPITAL_COMMUNITY)
Admission: EM | Admit: 2022-03-20 | Discharge: 2022-03-22 | DRG: 291 | Disposition: A | Discharge status: Home / Self Care | Payer: Medicare Other | Source: Ambulatory Visit | Attending: Family Medicine | Admitting: Family Medicine

## 2022-03-20 DIAGNOSIS — I248 Other forms of acute ischemic heart disease: Secondary | ICD-10-CM | POA: Diagnosis present

## 2022-03-20 DIAGNOSIS — Z79899 Other long term (current) drug therapy: Secondary | ICD-10-CM

## 2022-03-20 DIAGNOSIS — E871 Hypo-osmolality and hyponatremia: Secondary | ICD-10-CM | POA: Diagnosis present

## 2022-03-20 DIAGNOSIS — I130 Hypertensive heart and chronic kidney disease with heart failure and stage 1 through stage 4 chronic kidney disease, or unspecified chronic kidney disease: Principal | ICD-10-CM | POA: Diagnosis present

## 2022-03-20 DIAGNOSIS — J9601 Acute respiratory failure with hypoxia: Secondary | ICD-10-CM | POA: Diagnosis present

## 2022-03-20 DIAGNOSIS — Z7951 Long term (current) use of inhaled steroids: Secondary | ICD-10-CM

## 2022-03-20 DIAGNOSIS — N1831 Chronic kidney disease, stage 3a: Secondary | ICD-10-CM | POA: Diagnosis present

## 2022-03-20 DIAGNOSIS — I2510 Atherosclerotic heart disease of native coronary artery without angina pectoris: Secondary | ICD-10-CM | POA: Diagnosis present

## 2022-03-20 DIAGNOSIS — I428 Other cardiomyopathies: Secondary | ICD-10-CM | POA: Diagnosis present

## 2022-03-20 DIAGNOSIS — E1122 Type 2 diabetes mellitus with diabetic chronic kidney disease: Secondary | ICD-10-CM | POA: Diagnosis present

## 2022-03-20 DIAGNOSIS — I5042 Chronic combined systolic (congestive) and diastolic (congestive) heart failure: Secondary | ICD-10-CM | POA: Diagnosis present

## 2022-03-20 DIAGNOSIS — E785 Hyperlipidemia, unspecified: Secondary | ICD-10-CM | POA: Diagnosis present

## 2022-03-20 DIAGNOSIS — I5043 Acute on chronic combined systolic (congestive) and diastolic (congestive) heart failure: Secondary | ICD-10-CM | POA: Diagnosis present

## 2022-03-20 DIAGNOSIS — F17210 Nicotine dependence, cigarettes, uncomplicated: Secondary | ICD-10-CM | POA: Diagnosis present

## 2022-03-20 DIAGNOSIS — J439 Emphysema, unspecified: Secondary | ICD-10-CM | POA: Diagnosis present

## 2022-03-20 DIAGNOSIS — J441 Chronic obstructive pulmonary disease with (acute) exacerbation: Secondary | ICD-10-CM

## 2022-03-20 HISTORY — DX: Heart failure, unspecified: I50.9

## 2022-03-20 HISTORY — DX: Acute respiratory failure with hypoxia: J96.01

## 2022-03-20 HISTORY — DX: Essential (primary) hypertension: I10

## 2022-03-20 LAB — CBC WITH DIFFERENTIAL/PLATELET
Abs Immature Granulocytes: 0.03 10*3/uL (ref 0.00–0.07)
Basophils Absolute: 0.1 10*3/uL (ref 0.0–0.1)
Basophils Relative: 1 %
Eosinophils Absolute: 1 10*3/uL — ABNORMAL HIGH (ref 0.0–0.5)
Eosinophils Relative: 14 %
HCT: 48.9 % (ref 39.0–52.0)
Hemoglobin: 16 g/dL (ref 13.0–17.0)
Immature Granulocytes: 0 %
Lymphocytes Relative: 20 %
Lymphs Abs: 1.5 10*3/uL (ref 0.7–4.0)
MCH: 30 pg (ref 26.0–34.0)
MCHC: 32.7 g/dL (ref 30.0–36.0)
MCV: 91.7 fL (ref 80.0–100.0)
Monocytes Absolute: 0.9 10*3/uL (ref 0.1–1.0)
Monocytes Relative: 12 %
Neutro Abs: 3.8 10*3/uL (ref 1.7–7.7)
Neutrophils Relative %: 53 %
Platelets: 213 10*3/uL (ref 150–400)
RBC: 5.33 MIL/uL (ref 4.22–5.81)
RDW: 15.2 % (ref 11.5–15.5)
WBC: 7.2 10*3/uL (ref 4.0–10.5)
nRBC: 0 % (ref 0.0–0.2)

## 2022-03-20 LAB — GLUCOSE, CAPILLARY: Glucose-Capillary: 153 mg/dL — ABNORMAL HIGH (ref 70–99)

## 2022-03-20 LAB — BASIC METABOLIC PANEL
Anion gap: 9 (ref 5–15)
BUN: 23 mg/dL (ref 8–23)
CO2: 26 mmol/L (ref 22–32)
Calcium: 9.1 mg/dL (ref 8.9–10.3)
Chloride: 99 mmol/L (ref 98–111)
Creatinine, Ser: 1.77 mg/dL — ABNORMAL HIGH (ref 0.61–1.24)
GFR, Estimated: 41 mL/min — ABNORMAL LOW (ref >60)
Glucose, Bld: 92 mg/dL (ref 70–99)
Potassium: 4.3 mmol/L (ref 3.5–5.1)
Sodium: 134 mmol/L — ABNORMAL LOW (ref 135–145)

## 2022-03-20 LAB — TROPONIN I (HIGH SENSITIVITY)
Troponin I (High Sensitivity): 36 ng/L — ABNORMAL HIGH (ref <18)
Troponin I (High Sensitivity): 40 ng/L — ABNORMAL HIGH (ref <18)

## 2022-03-20 LAB — BRAIN NATRIURETIC PEPTIDE: B Natriuretic Peptide: 1222.6 pg/mL — ABNORMAL HIGH (ref 0.0–100.0)

## 2022-03-20 MED ORDER — ATORVASTATIN CALCIUM 80 MG PO TABS
80.0000 mg | ORAL_TABLET | Freq: Every evening | ORAL | Status: DC
  Administered 2022-03-20 – 2022-03-21 (×2): 80 mg via ORAL
  Filled 2022-03-20 (×2): qty 1

## 2022-03-20 MED ORDER — FUROSEMIDE 20 MG PO TABS
20.0000 mg | ORAL_TABLET | Freq: Every day | ORAL | Status: DC
  Administered 2022-03-21: 20 mg via ORAL
  Filled 2022-03-20: qty 1

## 2022-03-20 MED ORDER — METHYLPREDNISOLONE SODIUM SUCC 125 MG IJ SOLR
125.0000 mg | Freq: Once | INTRAMUSCULAR | Status: AC
  Administered 2022-03-20: 125 mg via INTRAVENOUS
  Filled 2022-03-20: qty 2

## 2022-03-20 MED ORDER — IPRATROPIUM-ALBUTEROL 0.5-2.5 (3) MG/3ML IN SOLN: 3.0000 mL | Freq: Four times a day (QID) | RESPIRATORY_TRACT | Status: DC | PRN

## 2022-03-20 MED ORDER — ASPIRIN 81 MG PO CHEW
81.0000 mg | CHEWABLE_TABLET | Freq: Every day | ORAL | Status: DC
  Administered 2022-03-21 – 2022-03-22 (×2): 81 mg via ORAL
  Filled 2022-03-20 (×2): qty 1

## 2022-03-20 MED ORDER — IOHEXOL 350 MG/ML SOLN
65.0000 mL | Freq: Once | INTRAVENOUS | Status: AC | PRN
  Administered 2022-03-20: 65 mL via INTRAVENOUS

## 2022-03-20 MED ORDER — ENOXAPARIN SODIUM 40 MG/0.4ML IJ SOSY
40.0000 mg | PREFILLED_SYRINGE | INTRAMUSCULAR | Status: DC
  Administered 2022-03-20 – 2022-03-21 (×2): 40 mg via SUBCUTANEOUS
  Filled 2022-03-20 (×2): qty 0.4

## 2022-03-20 MED ORDER — SODIUM CHLORIDE 3 % IN NEBU
4.0000 mL | INHALATION_SOLUTION | Freq: Two times a day (BID) | RESPIRATORY_TRACT | Status: DC
  Administered 2022-03-21 – 2022-03-22 (×3): 4 mL via RESPIRATORY_TRACT
  Filled 2022-03-20 (×6): qty 4

## 2022-03-20 MED ORDER — DAPAGLIFLOZIN PROPANEDIOL 10 MG PO TABS
10.0000 mg | ORAL_TABLET | Freq: Every day | ORAL | Status: DC
  Administered 2022-03-21 – 2022-03-22 (×2): 10 mg via ORAL
  Filled 2022-03-20 (×2): qty 1

## 2022-03-20 MED ORDER — ALBUTEROL SULFATE HFA 108 (90 BASE) MCG/ACT IN AERS: 1.0000 | INHALATION_SPRAY | RESPIRATORY_TRACT | Status: DC | PRN

## 2022-03-20 MED ORDER — IPRATROPIUM-ALBUTEROL 0.5-2.5 (3) MG/3ML IN SOLN
3.0000 mL | Freq: Four times a day (QID) | RESPIRATORY_TRACT | Status: DC
  Administered 2022-03-20 – 2022-03-21 (×4): 3 mL via RESPIRATORY_TRACT
  Filled 2022-03-20 (×4): qty 3

## 2022-03-20 MED ORDER — IPRATROPIUM-ALBUTEROL 0.5-2.5 (3) MG/3ML IN SOLN
3.0000 mL | Freq: Once | RESPIRATORY_TRACT | Status: AC
  Administered 2022-03-20: 3 mL via RESPIRATORY_TRACT
  Filled 2022-03-20: qty 3

## 2022-03-20 MED ORDER — INSULIN ASPART 100 UNIT/ML IJ SOLN
0.0000 [IU] | Freq: Three times a day (TID) | INTRAMUSCULAR | Status: DC
  Administered 2022-03-21: 2 [IU] via SUBCUTANEOUS
  Administered 2022-03-21: 1 [IU] via SUBCUTANEOUS
  Administered 2022-03-21: 2 [IU] via SUBCUTANEOUS
  Administered 2022-03-22: 1 [IU] via SUBCUTANEOUS
  Administered 2022-03-22: 2 [IU] via SUBCUTANEOUS

## 2022-03-20 MED ORDER — GUAIFENESIN-DM 100-10 MG/5ML PO SYRP: 5.0000 mL | ORAL_SOLUTION | ORAL | Status: DC | PRN

## 2022-03-20 MED ORDER — GUAIFENESIN ER 600 MG PO TB12
600.0000 mg | ORAL_TABLET | Freq: Two times a day (BID) | ORAL | Status: DC
  Administered 2022-03-20 – 2022-03-22 (×4): 600 mg via ORAL
  Filled 2022-03-20 (×4): qty 1

## 2022-03-20 MED ORDER — INSULIN ASPART 100 UNIT/ML IJ SOLN: 0.0000 [IU] | Freq: Every day | INTRAMUSCULAR | Status: DC

## 2022-03-20 NOTE — ED Triage Notes (Signed)
Pt bib [facility] from PCP office where he was for a regular check up. After checking pt oxygen, pt found 85%RA and placed on 3LNC with bringing pt up to 95%. Pt presents with a productive cough and some wheezing. Pt states that he has been shob and had a cough for a week.  EMS vitals: 144/110, 92CBG, 85HR

## 2022-03-20 NOTE — H&P (Incomplete)
History and Physical  Marcus Tran [id number] DOB: 02/26/1951 DOA: 03/20/2022  Referring physician: Ezequiel Essex  PCP: Cipriano Mile, NP  Outpatient Specialists: Cardiology Patient coming from: Home through PCP's office.  Chief Complaint: Hypoxia at PCP's office   HPI: Marcus Tran is a 71 y.o. male with medical history significant for combined systolic and diastolic CHF, hypertension, hyperlipidemia, tobacco use disorder, who presented to [facility] ED from PCPs office due to hypoxia.  Recently admitted on 03/06/2022 and discharged on 03/11/2022 after being treated for acute metabolic encephalopathy due to hypoxia/hypercarbia in the setting of suspected underlying COPD, emphysema was seen on CT scan.  The patient endorses dyspnea with minimal exertion for the past few days.  Associated with intermittent productive cough with greenish phlegm, ongoing tobacco use, attempting to quit, down to 1 to 2 cigarettes/day. Denies any chest pain.     In the ED, BNP is elevated greater than 1200, high sensitivity troponin is elevated 36, 40.  Diffuse wheezing noted on exam.  The patient received a dose of IV Solu-Medrol 125 mg x 1 and nebulizer treatment, DuoNebs x 1 in the ED.  CT angio chest was negative for pulmonary embolism however, it showed moderate bronchial thickening, areas of mucoid impaction and mucous plugging within both lower lobes.  EDP requested admission due to hypoxia.  Admitted by hospitalist service.  ED Course: Tmax 98.3.  BP 163/110, pulse 87, respiratory 16, saturation 96% on 3 L.  Lab studies remarkable for serum sodium 134, BUN 23, creatinine 1.77 with baseline creatinine 1.3 and GFR 58.  BNP 1222.  Troponin 36, 40.  CBC essentially unremarkable.  Review of Systems: Review of systems as noted in the HPI. All other systems reviewed and are negative.   Past Medical History:  Diagnosis Date   CHF (congestive heart failure) ([place])    Dyslipidemia    Hypertension    Past Surgical History:   Procedure Laterality Date   LEG SURGERY     Right-sided as result of trauma   RIGHT/LEFT HEART CATH AND CORONARY ANGIOGRAPHY N/A 03/09/2022   Procedure: RIGHT/LEFT HEART CATH AND CORONARY ANGIOGRAPHY;  Surgeon: Lorretta Harp, MD;  Location: [facility];  Service: Cardiovascular;  Laterality: N/A;   Umbilicus surgery     As a child    Social History:  reports that he has been smoking cigarettes. He has a 25.00 pack-year smoking history. He does not have any smokeless tobacco history on file. He reports current drug use. Drug: Marijuana. No history on file for alcohol use.   No Known Allergies  Family history: No pertinent family history.  Prior to Admission medications   Medication Sig Start Date End Date Taking? Authorizing Provider  albuterol (VENTOLIN HFA) 108 (90 Base) MCG/ACT inhaler Inhale 1-2 puffs into the lungs every 4 (four) hours as needed for wheezing or shortness of breath. 11/19/21  Yes [provider]  aspirin 81 MG chewable tablet Chew 1 tablet (81 mg total) by mouth daily. 03/12/22  Yes Shawna Clamp, MD  atorvastatin (LIPITOR) 80 MG tablet Take 80 mg by mouth every evening. 03/18/22  Yes [provider]  dapagliflozin propanediol (FARXIGA) 10 MG TABS tablet Take 1 tablet (10 mg total) by mouth daily. 03/12/22  Yes Shawna Clamp, MD  furosemide (LASIX) 20 MG tablet Take 1 tablet (20 mg total) by mouth daily. 03/11/22  Yes Shawna Clamp, MD  ipratropium-albuterol (DUONEB) 0.5-2.5 (3) MG/3ML SOLN Take 3 mLs by nebulization every 6 (six) hours as  needed (shortness of breath). 02/10/22  Yes [provider]  isosorbide-hydrALAZINE (BIDIL) 20-37.5 MG tablet Take 1 tablet by mouth 3 (three) times daily. 03/11/22  Yes Cipriano Bunker, MD  metoprolol succinate (TOPROL-XL) 25 MG 24 hr tablet Take 1 tablet (25 mg total) by mouth daily. 03/12/22  Yes Cipriano Bunker, MD  nitroGLYCERIN (NITROSTAT) 0.4 MG SL tablet Place 1 tablet (0.4 mg total) under the  tongue every 5 (five) minutes as needed for chest pain. 03/11/22  Yes Cipriano Bunker, MD  potassium chloride (KLOR-CON M) 10 MEQ tablet Take 1 tablet (10 mEq total) by mouth daily. 03/11/22  Yes Cipriano Bunker, MD  sacubitril-valsartan (ENTRESTO) 24-26 MG Take 1 tablet by mouth 2 (two) times daily. 03/11/22  Yes Cipriano Bunker, MD  BREZTRI AEROSPHERE 160-9-4.8 MCG/ACT AERO Take 2 puffs by mouth 2 (two) times daily. Patient not taking: Reported on 03/20/2022 12/23/21   [provider]  potassium chloride (KLOR-CON) 10 MEQ tablet Take 10 mEq by mouth daily.  03/11/22  [provider]    Physical Exam: BP (!) 138/93   Pulse 84   Temp 98.3 F (36.8 C) (Oral)   Resp (!) 27   SpO2 95%   General: 71 y.o. year-old male well developed well nourished in no acute distress.  Alert and oriented x3. Cardiovascular: Regular rate and rhythm with no rubs or gallops.  No thyromegaly or JVD noted.  Trace lower extremity edema bilaterally. Respiratory: Diffused wheezes bilaterally. Poor inspiratory effort. Abdomen: Soft nontender nondistended with normal bowel sounds x4 quadrants. Muskuloskeletal: No cyanosis or clubbing.  Trace lower extremity edema noted bilaterally Neuro: CN II-XII intact, strength, sensation, reflexes Skin: No ulcerative lesions noted or rashes Psychiatry: Judgement and insight appear normal. Mood is appropriate for condition and setting          Labs on Admission:  Basic Metabolic Panel: Recent Labs  Lab 03/20/22 1650  NA 134*  K 4.3  CL 99  CO2 26  GLUCOSE 92  BUN 23  CREATININE 1.77*  CALCIUM 9.1   Liver Function Tests: No results for input(s): AST, ALT, ALKPHOS, BILITOT, PROT, ALBUMIN in the last 168 hours. No results for input(s): LIPASE, AMYLASE in the last 168 hours. No results for input(s): AMMONIA in the last 168 hours. CBC: Recent Labs  Lab 03/20/22 1650  WBC 7.2  NEUTROABS 3.8  HGB 16.0  HCT 48.9  MCV 91.7  PLT 213   Cardiac Enzymes: No  results for input(s): CKTOTAL, CKMB, CKMBINDEX, TROPONINI in the last 168 hours.  BNP (last 3 results) Recent Labs    03/06/22 0503 03/20/22 1650  BNP 1,211.4* 1,222.6*    ProBNP (last 3 results) No results for input(s): PROBNP in the last 8760 hours.  CBG: No results for input(s): GLUCAP in the last 168 hours.  Radiological Exams on Admission: CT Angio Chest PE W/Cm &/Or Wo Cm  Result Date: 03/20/2022 CLINICAL DATA:  Pulmonary embolism (PE) suspected, neg D-dimer Shortness of breath and cough.  Hypoxia. EXAM: CT ANGIOGRAPHY CHEST WITH CONTRAST TECHNIQUE: Multidetector CT imaging of the chest was performed using the standard protocol during bolus administration of intravenous contrast. Multiplanar CT image reconstructions and MIPs were obtained to evaluate the vascular anatomy. RADIATION DOSE REDUCTION: This exam was performed according to the departmental dose-optimization program which includes automated exposure control, adjustment of the mA and/or kV according to patient size and/or use of iterative reconstruction technique. CONTRAST:  73mL OMNIPAQUE IOHEXOL 350 MG/ML SOLN COMPARISON:  Radiograph earlier today.  Chest  CTA 2 weeks ago FINDINGS: Cardiovascular: There are no filling defects within the pulmonary arteries to suggest pulmonary embolus. Mild atherosclerosis of the thoracic aorta. Mild multi chamber cardiomegaly. There is contrast refluxing into the hepatic veins and IVC. No pericardial effusion. Mediastinum/Nodes: Shotty mediastinal and hilar lymph nodes, all less than 15 mm short axis. Similar findings on prior exam. Tiny hiatal hernia. No suspicious thyroid nodule. Lungs/Pleura: Mild emphysema. There is moderate bronchial thickening, areas of mucoid impaction and mucous plugging within both lower lobes. The previous right lower lobe pulmonary nodule has resolved from prior exam consistent with infectious or inflammatory etiology. Improve subpleural interstitial thickening and  diminished tiny subpleural nodules from prior. There is no acute or confluent airspace disease. No pleural effusion. Upper Abdomen: Contrast refluxes into the hepatic veins. Splenic flexure colonic diverticulosis incidentally noted. There is a small hiatal hernia. Musculoskeletal: Mild thoracic spondylosis with spurring. There are no acute or suspicious osseous abnormalities. Review of the MIP images confirms the above findings. IMPRESSION: 1. No pulmonary embolus. 2. Moderate bronchial thickening, areas of mucoid impaction and mucous plugging within both lower lobes, also seen on prior exam. 3. Mild multi chamber cardiomegaly with contrast refluxing into the hepatic veins and IVC consistent with elevated right heart pressures. 4. Previous right lower lobe pulmonary nodule has resolved from prior exam consistent with infectious or inflammatory etiology. Aortic Atherosclerosis (ICD10-I70.0) and Emphysema (ICD10-J43.9). Electronically Signed   By: Keith Rake M.D.   On: 03/20/2022 19:44   DG Chest Portable 1 View  Result Date: 03/20/2022 CLINICAL DATA:  cough EXAM: PORTABLE CHEST 1 VIEW COMPARISON:  Chest x-ray 03/06/2022, CT chest 03/06/2022 FINDINGS: Cardiomegaly.  The heart and mediastinal contours are unchanged. No focal consolidation. No pulmonary edema. No pleural effusion. No pneumothorax. No acute osseous abnormality. IMPRESSION: Cardiomegaly with no radiographic findings to suggest acute cardiopulmonary abnormality. Electronically Signed   By: Iven Finn M.D.   On: 03/20/2022 17:35    EKG: I independently viewed the EKG done and my findings are as followed: Sinus rhythm rate of 84.  Nonspecific ST-T changes.  QTc 492.  Assessment/Plan Present on Admission:  Acute respiratory failure with hypoxia (HCC)  Principal Problem:   Acute respiratory failure with hypoxia (HCC)  Acute hypoxic respiratory failure secondary to bronchial thickening, areas of mucus impaction, mucous plugging within  both lower lobes, seen on CT scan. Oxygen saturation 85% on room air. Not on O2 supplementation at baseline. O2 supplementation to maintain oxygen saturation greater than 90% Start pulmonary toilet, hypersaline nebs twice daily, Mucinex 600 mg twice daily, Antitussives as needed, incentive spirometer. Home O2 eval prior to dc TOC consulted to assist with dc planning, DMEs  Suspected underlying COPD/emphysema seen on CT scan Bronchial thickening, mucus impaction, mucous plugging within both lower lobes, seen on CT scan Rocephin daily x4 days Monitor fever curve and WBC  Elevated troponin, suspect demand ischemia in the setting of acute hypoxia. Denies any anginal symptoms. High-sensitivity troponin peaked at 40 No evidence of acute ischemia on twelve-lead EKG Continue to monitor on telemetry cardiac unit Recent 2D echo done on 03/06/2022  COPD exacerbation Received a dose of IV Solu-Medrol 125 mg in the ED. Start Rocephin and IV Solu-Medrol 40 mg daily Bronchodilators Pulmonary toilet Maintain O2 saturation greater than 90%.  Chronic combined systolic diastolic CHF Last 2D echo done on 03/06/2022 showed LVEF 35% and grade 2 diastolic dysfunction Euvolemic on exam Resume home p.o. diuretic Start strict I's and O's and daily weight  Tobacco  use disorder Tobacco cessation counseled on at bedside Endorses cutting down tobacco use, now 1 to 2 cigarettes/day Nicotine patch     DVT prophylaxis: Subcu Lovenox daily  Code Status: Full code  Family Communication: None at bedside.  Disposition Plan: Admitted to telemetry cardiac unit  Consults called: None  Admission status: Observation status   Status is: Observation    Kayleen Memos MD [facility] Pager [phone]  If 7PM-7AM, please contact night-coverage [email or web address] Password [facility]  03/20/2022, 9:33 PM

## 2022-03-20 NOTE — ED Notes (Signed)
Patient transported to CT 

## 2022-03-20 NOTE — ED Notes (Signed)
PT back in room  

## 2022-03-20 NOTE — ED Provider Notes (Signed)
[facility] EMERGENCY DEPARTMENT Provider Note   CSN: [id number] Arrival date & time: 03/20/22  1628     History  Chief Complaint  Patient presents with   Shortness of Breath   Cough    Marcus Tran is a 71 y.o. male.   Shortness of Breath Associated symptoms: cough   Cough Associated symptoms: shortness of breath    Patient with medical history of hypertension, dyslipidemia, CHF, tobacco use disorder, bilateral hilar adenopathy syndrome presents today due to shortness of breath.  Patient states he has been feeling short of breath for about a week.  He is also having a productive cough with green sputum which is intermittent.  States she was at his PCP office earlier for routine check and he became hypoxic at 85%.  Not on oxygen at home.  EMS called and brought patient here, put on 3 L of supplemental oxygen which improved his oxygenation to 98%.  Patient denies any chest pain, he has had some trace lower extremity swelling.  Home Medications Prior to Admission medications   Medication Sig Start Date End Date Taking? Authorizing Provider  albuterol (VENTOLIN HFA) 108 (90 Base) MCG/ACT inhaler Inhale 1-2 puffs into the lungs every 4 (four) hours as needed for wheezing or shortness of breath. 11/19/21  Yes [provider]  aspirin 81 MG chewable tablet Chew 1 tablet (81 mg total) by mouth daily. 03/12/22  Yes Cipriano Bunker, MD  atorvastatin (LIPITOR) 80 MG tablet Take 80 mg by mouth every evening. 03/18/22  Yes [provider]  dapagliflozin propanediol (FARXIGA) 10 MG TABS tablet Take 1 tablet (10 mg total) by mouth daily. 03/12/22  Yes Cipriano Bunker, MD  furosemide (LASIX) 20 MG tablet Take 1 tablet (20 mg total) by mouth daily. 03/11/22  Yes Cipriano Bunker, MD  ipratropium-albuterol (DUONEB) 0.5-2.5 (3) MG/3ML SOLN Take 3 mLs by nebulization every 6 (six) hours as needed (shortness of breath). 02/10/22  Yes [provider]   isosorbide-hydrALAZINE (BIDIL) 20-37.5 MG tablet Take 1 tablet by mouth 3 (three) times daily. 03/11/22  Yes Cipriano Bunker, MD  metoprolol succinate (TOPROL-XL) 25 MG 24 hr tablet Take 1 tablet (25 mg total) by mouth daily. 03/12/22  Yes Cipriano Bunker, MD  nitroGLYCERIN (NITROSTAT) 0.4 MG SL tablet Place 1 tablet (0.4 mg total) under the tongue every 5 (five) minutes as needed for chest pain. 03/11/22  Yes Cipriano Bunker, MD  potassium chloride (KLOR-CON M) 10 MEQ tablet Take 1 tablet (10 mEq total) by mouth daily. 03/11/22  Yes Cipriano Bunker, MD  sacubitril-valsartan (ENTRESTO) 24-26 MG Take 1 tablet by mouth 2 (two) times daily. 03/11/22  Yes Cipriano Bunker, MD  BREZTRI AEROSPHERE 160-9-4.8 MCG/ACT AERO Take 2 puffs by mouth 2 (two) times daily. Patient not taking: Reported on 03/20/2022 12/23/21   [provider]  potassium chloride (KLOR-CON) 10 MEQ tablet Take 10 mEq by mouth daily.  03/11/22  [provider]      Allergies    Patient has no known allergies.    Review of Systems   Review of Systems  Respiratory:  Positive for cough and shortness of breath.    Physical Exam Updated Vital Signs BP (!) 138/93   Pulse 84   Temp 98.3 F (36.8 C) (Oral)   Resp (!) 27   SpO2 95%  Physical Exam Vitals and nursing note reviewed. Exam conducted with a chaperone present.  Constitutional:      Appearance: Normal appearance.  HENT:  Head: Normocephalic and atraumatic.  Eyes:     General: No scleral icterus.       Right eye: No discharge.        Left eye: No discharge.     Extraocular Movements: Extraocular movements intact.     Pupils: Pupils are equal, round, and reactive to light.  Cardiovascular:     Rate and Rhythm: Normal rate and regular rhythm.     Pulses: Normal pulses.     Heart sounds: Normal heart sounds. No murmur heard.   No friction rub. No gallop.  Pulmonary:     Effort: Tachypnea present. No accessory muscle usage or respiratory distress.     Breath  sounds: Wheezing present.  Abdominal:     General: Abdomen is flat. Bowel sounds are normal. There is no distension.     Palpations: Abdomen is soft.     Tenderness: There is no abdominal tenderness.  Skin:    General: Skin is warm and dry.     Coloration: Skin is not jaundiced.  Neurological:     Mental Status: He is alert. Mental status is at baseline.     Coordination: Coordination normal.    ED Results / Procedures / Treatments   Labs (all labs ordered are listed, but only abnormal results are displayed) Labs Reviewed  CBC WITH DIFFERENTIAL/PLATELET - Abnormal; Notable for the following components:      Result Value   Eosinophils Absolute 1.0 (*)    All other components within normal limits  BASIC METABOLIC PANEL - Abnormal; Notable for the following components:   Sodium 134 (*)    Creatinine, Ser 1.77 (*)    GFR, Estimated 41 (*)    All other components within normal limits  BRAIN NATRIURETIC PEPTIDE - Abnormal; Notable for the following components:   B Natriuretic Peptide 1,222.6 (*)    All other components within normal limits  TROPONIN I (HIGH SENSITIVITY) - Abnormal; Notable for the following components:   Troponin I (High Sensitivity) 36 (*)    All other components within normal limits  TROPONIN I (HIGH SENSITIVITY) - Abnormal; Notable for the following components:   Troponin I (High Sensitivity) 40 (*)    All other components within normal limits    EKG EKG Interpretation  Date/Time:  Friday March 20 2022 17:18:20 EDT Ventricular Rate:  84 PR Interval:  164 QRS Duration: 133 QT Interval:  416 QTC Calculation: 492 R Axis:   47 Text Interpretation: Sinus rhythm Biatrial enlargement Right bundle branch block Anteroseptal infarct, age indeterminate Lateral leads are also involved when compared to prior, t wave inversions are less sharp. No STEMI Confirmed by Theda Belfast ([id number]) on 03/20/2022 5:32:10 PM  Radiology CT Angio Chest PE W/Cm &/Or Wo Cm  Result  Date: 03/20/2022 CLINICAL DATA:  Pulmonary embolism (PE) suspected, neg D-dimer Shortness of breath and cough.  Hypoxia. EXAM: CT ANGIOGRAPHY CHEST WITH CONTRAST TECHNIQUE: Multidetector CT imaging of the chest was performed using the standard protocol during bolus administration of intravenous contrast. Multiplanar CT image reconstructions and MIPs were obtained to evaluate the vascular anatomy. RADIATION DOSE REDUCTION: This exam was performed according to the departmental dose-optimization program which includes automated exposure control, adjustment of the mA and/or kV according to patient size and/or use of iterative reconstruction technique. CONTRAST:  69mL OMNIPAQUE IOHEXOL 350 MG/ML SOLN COMPARISON:  Radiograph earlier today.  Chest CTA 2 weeks ago FINDINGS: Cardiovascular: There are no filling defects within the pulmonary arteries to suggest pulmonary embolus. Mild atherosclerosis  of the thoracic aorta. Mild multi chamber cardiomegaly. There is contrast refluxing into the hepatic veins and IVC. No pericardial effusion. Mediastinum/Nodes: Shotty mediastinal and hilar lymph nodes, all less than 15 mm short axis. Similar findings on prior exam. Tiny hiatal hernia. No suspicious thyroid nodule. Lungs/Pleura: Mild emphysema. There is moderate bronchial thickening, areas of mucoid impaction and mucous plugging within both lower lobes. The previous right lower lobe pulmonary nodule has resolved from prior exam consistent with infectious or inflammatory etiology. Improve subpleural interstitial thickening and diminished tiny subpleural nodules from prior. There is no acute or confluent airspace disease. No pleural effusion. Upper Abdomen: Contrast refluxes into the hepatic veins. Splenic flexure colonic diverticulosis incidentally noted. There is a small hiatal hernia. Musculoskeletal: Mild thoracic spondylosis with spurring. There are no acute or suspicious osseous abnormalities. Review of the MIP images confirms  the above findings. IMPRESSION: 1. No pulmonary embolus. 2. Moderate bronchial thickening, areas of mucoid impaction and mucous plugging within both lower lobes, also seen on prior exam. 3. Mild multi chamber cardiomegaly with contrast refluxing into the hepatic veins and IVC consistent with elevated right heart pressures. 4. Previous right lower lobe pulmonary nodule has resolved from prior exam consistent with infectious or inflammatory etiology. Aortic Atherosclerosis (ICD10-I70.0) and Emphysema (ICD10-J43.9). Electronically Signed   By: Narda Rutherford M.D.   On: 03/20/2022 19:44   DG Chest Portable 1 View  Result Date: 03/20/2022 CLINICAL DATA:  cough EXAM: PORTABLE CHEST 1 VIEW COMPARISON:  Chest x-ray 03/06/2022, CT chest 03/06/2022 FINDINGS: Cardiomegaly.  The heart and mediastinal contours are unchanged. No focal consolidation. No pulmonary edema. No pleural effusion. No pneumothorax. No acute osseous abnormality. IMPRESSION: Cardiomegaly with no radiographic findings to suggest acute cardiopulmonary abnormality. Electronically Signed   By: Tish Frederickson M.D.   On: 03/20/2022 17:35    Procedures Procedures    Medications Ordered in ED Medications  ipratropium-albuterol (DUONEB) 0.5-2.5 (3) MG/3ML nebulizer solution 3 mL (3 mLs Nebulization Given 03/20/22 1717)  methylPREDNISolone sodium succinate (SOLU-MEDROL) 125 mg/2 mL injection 125 mg (125 mg Intravenous Given 03/20/22 1717)  iohexol (OMNIPAQUE) 350 MG/ML injection 65 mL (65 mLs Intravenous Contrast Given 03/20/22 1933)    ED Course/ Medical Decision Making/ A&P                           Medical Decision Making Amount and/or Complexity of Data Reviewed Labs: ordered. Radiology: ordered.  Risk Prescription drug management. Decision regarding hospitalization.   This patient presents to the ED for concern of shortness of breath and cough, this involves an extensive number of treatment options, and is a complaint that carries with  it a high risk of complications and morbidity.  The differential diagnosis includes CHF exacerbation, pneumonia, PE, ACS.  Patient's presentation is complicated by their history of hypertension, dyslipidemia, CHF.   Additional history obtained:    Reviewed external records, patient recent history is notable for right/left heart cath and coronary angiography on 03/09/2022.  Last echo on 02/24/2022 which shows an ejection fracture of 35%.    Lab Tests:  I ordered, viewed, and personally interpreted labs.  The pertinent results include: No leukocytosis or anemia.  No gross electrolyte derangement or AKI.  Patient's BNP is elevated at one 222.6 and troponin is slightly elevated at 36.  I suspect these are likely demand ischemia versus increased right heart pressures based on the CTA read.  Second troponin pending.    Imaging Studies ordered:  I directly visualized the chest x-ray which showed cardiomegaly.  I also ordered a CTA chest PE study, which showed no signs of PE but cardiomegaly and evidence of increased right heart pressures.  I agree with the radiologist interpretation    ECG/Cardiac monitoring:   Per my interpretation, EKG shows diffuse T wave inversions which actually appear less sharp compared to previous EKGs  The patient was maintained on a cardiac monitor.  Visualized monitor strip which showed sinus rhythm per my interpretation.    Medicines ordered and prescription drug management:  I ordered medication including: DuoNeb, prednisone  I have reviewed the patients home medicines and have made adjustments as needed     Consultations Obtained:  I requested consultation with the hospitalist Dr. Margo Aye.  Discussed lab and imaging findings as well as pertinent plan - they recommend: admission   Reevaluation:  After the interventions noted above, I reevaluated the patient and found patient is improved with the 3 L of supplemental oxygen.  Does not feel short of breath  with supplemental oxygen.  There is still some mild wheezing in the lungs.   Problems addressed / ED Course: Given patient has new oxygen requirement and sudden hypoxia I do think he would need to be admitted due to acute respiratory failure with hypoxia.  No evidence of PE, suspect related to increased right heart pressures/CHF exacerbation.  I do not feel this is ACS, seems more consistent with demand ischemia.      Social Determinants of Health:    Disposition:   After consideration of the diagnostic results and the patients response to treatment, I feel that the patent would benefit from admission.            Final Clinical Impression(s) / ED Diagnoses Final diagnoses:  Acute respiratory failure with hypoxia [facility])    Rx / DC Orders ED Discharge Orders     None         Theron Arista, Cordelia Poche 03/20/22 2108    Tegeler, Canary Brim, MD 03/20/22 [phone]

## 2022-03-21 DIAGNOSIS — E785 Hyperlipidemia, unspecified: Secondary | ICD-10-CM | POA: Diagnosis present

## 2022-03-21 DIAGNOSIS — I130 Hypertensive heart and chronic kidney disease with heart failure and stage 1 through stage 4 chronic kidney disease, or unspecified chronic kidney disease: Secondary | ICD-10-CM | POA: Diagnosis present

## 2022-03-21 DIAGNOSIS — J439 Emphysema, unspecified: Secondary | ICD-10-CM | POA: Diagnosis present

## 2022-03-21 DIAGNOSIS — Z7951 Long term (current) use of inhaled steroids: Secondary | ICD-10-CM | POA: Diagnosis not present

## 2022-03-21 DIAGNOSIS — E1122 Type 2 diabetes mellitus with diabetic chronic kidney disease: Secondary | ICD-10-CM | POA: Diagnosis present

## 2022-03-21 DIAGNOSIS — J9601 Acute respiratory failure with hypoxia: Secondary | ICD-10-CM | POA: Diagnosis present

## 2022-03-21 DIAGNOSIS — I248 Other forms of acute ischemic heart disease: Secondary | ICD-10-CM | POA: Diagnosis present

## 2022-03-21 DIAGNOSIS — I428 Other cardiomyopathies: Secondary | ICD-10-CM | POA: Diagnosis present

## 2022-03-21 DIAGNOSIS — I5043 Acute on chronic combined systolic (congestive) and diastolic (congestive) heart failure: Secondary | ICD-10-CM | POA: Diagnosis present

## 2022-03-21 DIAGNOSIS — I2510 Atherosclerotic heart disease of native coronary artery without angina pectoris: Secondary | ICD-10-CM | POA: Diagnosis present

## 2022-03-21 DIAGNOSIS — Z79899 Other long term (current) drug therapy: Secondary | ICD-10-CM | POA: Diagnosis not present

## 2022-03-21 DIAGNOSIS — F17210 Nicotine dependence, cigarettes, uncomplicated: Secondary | ICD-10-CM | POA: Diagnosis present

## 2022-03-21 DIAGNOSIS — N1831 Chronic kidney disease, stage 3a: Secondary | ICD-10-CM | POA: Diagnosis present

## 2022-03-21 DIAGNOSIS — E871 Hypo-osmolality and hyponatremia: Secondary | ICD-10-CM | POA: Diagnosis present

## 2022-03-21 LAB — CBC WITH DIFFERENTIAL/PLATELET
Abs Immature Granulocytes: 0.04 10*3/uL (ref 0.00–0.07)
Basophils Absolute: 0 10*3/uL (ref 0.0–0.1)
Basophils Relative: 0 %
Eosinophils Absolute: 0 10*3/uL (ref 0.0–0.5)
Eosinophils Relative: 0 %
HCT: 49.2 % (ref 39.0–52.0)
Hemoglobin: 16.2 g/dL (ref 13.0–17.0)
Immature Granulocytes: 1 %
Lymphocytes Relative: 8 %
Lymphs Abs: 0.5 10*3/uL — ABNORMAL LOW (ref 0.7–4.0)
MCH: 29.7 pg (ref 26.0–34.0)
MCHC: 32.9 g/dL (ref 30.0–36.0)
MCV: 90.3 fL (ref 80.0–100.0)
Monocytes Absolute: 0.1 10*3/uL (ref 0.1–1.0)
Monocytes Relative: 2 %
Neutro Abs: 5.3 10*3/uL (ref 1.7–7.7)
Neutrophils Relative %: 89 %
Platelets: 209 10*3/uL (ref 150–400)
RBC: 5.45 MIL/uL (ref 4.22–5.81)
RDW: 15.2 % (ref 11.5–15.5)
WBC: 6 10*3/uL (ref 4.0–10.5)
nRBC: 0 % (ref 0.0–0.2)

## 2022-03-21 LAB — BASIC METABOLIC PANEL
Anion gap: 9 (ref 5–15)
BUN: 24 mg/dL — ABNORMAL HIGH (ref 8–23)
CO2: 27 mmol/L (ref 22–32)
Calcium: 9 mg/dL (ref 8.9–10.3)
Chloride: 99 mmol/L (ref 98–111)
Creatinine, Ser: 1.6 mg/dL — ABNORMAL HIGH (ref 0.61–1.24)
GFR, Estimated: 46 mL/min — ABNORMAL LOW (ref >60)
Glucose, Bld: 169 mg/dL — ABNORMAL HIGH (ref 70–99)
Potassium: 4.7 mmol/L (ref 3.5–5.1)
Sodium: 135 mmol/L (ref 135–145)

## 2022-03-21 LAB — GLUCOSE, CAPILLARY
Glucose-Capillary: 199 mg/dL — ABNORMAL HIGH (ref 70–99)
Glucose-Capillary: 162 mg/dL — ABNORMAL HIGH (ref 70–99)
Glucose-Capillary: 139 mg/dL — ABNORMAL HIGH (ref 70–99)
Glucose-Capillary: 148 mg/dL — ABNORMAL HIGH (ref 70–99)

## 2022-03-21 LAB — URINALYSIS, ROUTINE W REFLEX MICROSCOPIC
Bacteria, UA: NONE SEEN
Bilirubin Urine: NEGATIVE
Glucose, UA: >=500 mg/dL — AB
Hgb urine dipstick: NEGATIVE
Ketones, ur: NEGATIVE mg/dL
Leukocytes,Ua: NEGATIVE
Nitrite: NEGATIVE
Protein, ur: NEGATIVE mg/dL
Specific Gravity, Urine: 1.005 (ref 1.005–1.030)
pH: 5 (ref 5.0–8.0)

## 2022-03-21 LAB — MAGNESIUM: Magnesium: 2.3 mg/dL (ref 1.7–2.4)

## 2022-03-21 LAB — CREATININE, URINE, RANDOM: Creatinine, Urine: 20.03 mg/dL

## 2022-03-21 LAB — SODIUM, URINE, RANDOM: Sodium, Ur: 37 mmol/L

## 2022-03-21 LAB — PHOSPHORUS: Phosphorus: 4.5 mg/dL (ref 2.5–4.6)

## 2022-03-21 LAB — PROCALCITONIN: Procalcitonin: <0.1 ng/mL

## 2022-03-21 MED ORDER — IPRATROPIUM-ALBUTEROL 0.5-2.5 (3) MG/3ML IN SOLN
3.0000 mL | Freq: Three times a day (TID) | RESPIRATORY_TRACT | Status: DC
  Administered 2022-03-21 – 2022-03-22 (×3): 3 mL via RESPIRATORY_TRACT
  Filled 2022-03-21 (×3): qty 3

## 2022-03-21 MED ORDER — SODIUM CHLORIDE 0.9 % IV SOLN
2.0000 g | INTRAVENOUS | Status: DC
  Administered 2022-03-21 – 2022-03-22 (×2): 2 g via INTRAVENOUS
  Filled 2022-03-21 (×2): qty 20

## 2022-03-21 MED ORDER — METHYLPREDNISOLONE SODIUM SUCC 40 MG IJ SOLR
40.0000 mg | Freq: Every day | INTRAMUSCULAR | Status: DC
  Administered 2022-03-21 – 2022-03-22 (×2): 40 mg via INTRAVENOUS
  Filled 2022-03-21 (×3): qty 1

## 2022-03-21 MED ORDER — FUROSEMIDE 10 MG/ML IJ SOLN
40.0000 mg | Freq: Once | INTRAMUSCULAR | Status: AC
  Administered 2022-03-21: 40 mg via INTRAVENOUS
  Filled 2022-03-21: qty 4

## 2022-03-21 MED ORDER — NICOTINE 14 MG/24HR TD PT24
14.0000 mg | MEDICATED_PATCH | Freq: Every day | TRANSDERMAL | Status: DC
  Administered 2022-03-21 – 2022-03-22 (×2): 14 mg via TRANSDERMAL
  Filled 2022-03-21 (×2): qty 1

## 2022-03-21 MED ORDER — SODIUM CHLORIDE 0.9 % IV SOLN: INTRAVENOUS | Status: DC | PRN

## 2022-03-21 MED ORDER — ORAL CARE MOUTH RINSE
15.0000 mL | Freq: Two times a day (BID) | OROMUCOSAL | Status: DC
  Administered 2022-03-21 – 2022-03-22 (×3): 15 mL via OROMUCOSAL

## 2022-03-21 MED ORDER — ISOSORB DINITRATE-HYDRALAZINE 20-37.5 MG PO TABS
1.0000 | ORAL_TABLET | Freq: Three times a day (TID) | ORAL | Status: DC
  Administered 2022-03-21 – 2022-03-22 (×5): 1 via ORAL
  Filled 2022-03-21 (×5): qty 1

## 2022-03-21 NOTE — Progress Notes (Addendum)
Progress Note   Patient: Marcus Tran [id number] DOB: 30-Mar-1951 DOA: 03/20/2022     0 DOS: the patient was seen and examined on 03/21/2022   Brief hospital course: Marcus Tran is a 71 y.o. male with medical history significant for combined systolic and diastolic CHF, hypertension, hyperlipidemia, tobacco use disorder, who presented to [facility] ED from PCPs office due to hypoxia.  Recently admitted on 03/06/2022 and discharged on 03/11/2022 after being treated for acute metabolic encephalopathy due to hypoxia/hypercarbia in the setting of suspected underlying COPD, and diagnosed with new onset CHF.  The patient endorses dyspnea with minimal exertion for the past few days.  Associated with intermittent productive cough with greenish phlegm. In ED started treatment for COPD exacerbation.   6/3: lasix 40 mg IV dosed, restarted bidil due to HTN. Entresto continued to be held due to elevated Cr.   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 Pt reports several days of dyspnea, cough with green phlegm, CT with bronchial thickening, areas of mucus impaction, mucous plugging within both lower lobes. Oxygen saturation 85% on room air, Not on O2 supplementation at baseline. O2 supplementation to maintain oxygen saturation greater than 90%.  She was started on COPD treatment in the ED.  Suspect that he may have a component heart failure exacerbation as above.  Hypertensive on admit. BNP elevated, though weight lower than recent discharge weight (74 kg vs 71.6 on admit). CT also notable for reflux into hepatic veins. Will plan to treat for COPD exacerbation and gentile diuresis.  -Start pulmonary toilet, hypersaline nebs twice daily, Mucinex 600 mg twice daily -Antitussives as needed, incentive spirometer. -Home O2 eval prior to dc -Continue CRO 6/2-  (5 days)  -continue methylpred 40 mg (6/3- -continue duo-nebs q8h   Chronic combined systolic diastolic CHF New diagnosis last admission, LHC complete 5/22. Last 2D echo done on 03/06/2022 showed LVEF 35% and grade 2 diastolic dysfunction. CT notable for contrast reflux into hepatic vein. No LE edema, +JVP.  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 Etiology unknown, suspected prerenal. received contrast 6/2 -hold entresto -BMP tomorrow  -urine studies, U/A, bladder scan to rule out post  DM2 Recent A1c 6.9. -Atorvastatin 80 mg -On sliding scale insulin  Hyponatremia, resolved  Elevated troponin, suspect demand ischemia in the setting of acute hypoxia Denies any anginal symptoms. High-sensitivity troponin peaked at 40. No evidence of acute ischemia on twelve-lead EKG. Continue to monitor on    Tobacco use disorder Tobacco cessation counseled on at bedside Endorses cutting down tobacco use, now 1 to 2 cigarettes/day Nicotine patch   DVT prophylaxis: Subcu Lovenox daily Code Status: Full code Disposition Plan: Admitted to telemetry cardiac unit Consults called: None Admission status: Observation status  Subjective: Feels okay, still having SOB with minimal exertion.  Reports no chest pain, or lower extremity edema.  He does not normally wear oxygen at home, only CPAP.   Physical Exam: Vitals:   03/21/22 0724 03/21/22 0847 03/21/22 1107 03/21/22 1405  BP: (!) 161/114  121/87   Pulse: 83  82   Resp: 18  20   Temp:   97.7 F (36.5 C)   TempSrc:   Oral   SpO2: 96% 99% 93% 90%  Weight:      Height:       Physical Exam Vitals and nursing note reviewed.  Constitutional:      General: He is not in acute distress.    Appearance: He is not  diaphoretic.  HENT:     Head: Atraumatic.  Eyes:     Pupils: Pupils are equal, round, and reactive to light.  Neck:     Vascular: Hepatojugular reflux present.  Cardiovascular:     Rate and Rhythm: Normal rate and regular rhythm.     Pulses: Normal pulses.     Heart sounds: No murmur heard. Pulmonary:     Effort: Pulmonary effort is normal. No respiratory distress.     Breath sounds: Examination of the right-lower field reveals decreased breath sounds. Examination of the left-lower field reveals decreased breath sounds. Decreased breath sounds present. No wheezing or rales.  Abdominal:     General: Abdomen is flat. There is no distension.     Palpations: Abdomen is soft.     Tenderness: There is no abdominal tenderness. There is no rebound.  Musculoskeletal:     Right lower leg: No edema.     Left lower leg: No edema.  Skin:    General: Skin is warm and dry.     Capillary Refill: Capillary refill takes less than 2 seconds.  Neurological:     Mental Status: He is alert and oriented to person, place, and time. Mental status is at baseline.  Psychiatric:        Mood and Affect: Mood normal.    Data Reviewed:     Latest Ref Rng & Units 03/21/2022    3:47 AM 03/20/2022    4:50 PM 03/11/2022    1:45 AM  CBC  WBC 4.0 - 10.5 K/uL 6.0   7.2   14.8    Hemoglobin 13.0 - 17.0 g/dL 16.2   16.0   16.9    Hematocrit 39.0 - 52.0 % 49.2   48.9   50.0    Platelets 150 - 400 K/uL 209   213   211        Latest Ref Rng & Units 03/21/2022    3:47 AM 03/20/2022    4:50 PM 03/11/2022    1:45 AM  BMP  Glucose 70 - 99 mg/dL 169   92   142    BUN 8 - 23 mg/dL 24   23   25     Creatinine 0.61 - 1.24 mg/dL 1.60   1.77   1.31    Sodium 135 - 145 mmol/L 135   134   131    Potassium 3.5 - 5.1 mmol/L 4.7   4.3   4.4    Chloride 98 - 111 mmol/L 99   99   96    CO2 22 - 32 mmol/L 27   26   26     Calcium 8.9 - 10.3 mg/dL 9.0   9.1   8.6     BNP: 1222  CT angio: 1. No pulmonary embolus. 2. Moderate  bronchial thickening, areas of mucoid impaction and mucous plugging within both lower lobes, also seen on prior exam. 3. Mild multi chamber cardiomegaly with contrast refluxing into the hepatic veins and IVC consistent with elevated right heart pressures. 4. Previous right lower lobe pulmonary nodule has resolved from prior exam  consistent with infectious or inflammatory etiology.  Family Communication: none at bedside  Disposition: Status is: Inpatient Remains inpatient appropriate because: continued hypoxia  Planned Discharge Destination: Home    Time spent: 45 minutes  Author: Lorelei Pont, MD 03/21/2022 3:20 PM  For on call review [email or web address].

## 2022-03-21 NOTE — Hospital Course (Addendum)
Marcus Tran is a 71 y.o. male with medical history significant for combined systolic and diastolic CHF, hypertension, hyperlipidemia, tobacco use disorder, who presented to [facility] ED from PCPs office due to hypoxia.  Recently admitted on 03/06/2022 and discharged on 03/11/2022 after being treated for acute metabolic encephalopathy due to hypoxia/hypercarbia in the setting of suspected underlying COPD, and diagnosed with new onset CHF.  The patient endorses dyspnea with minimal exertion for the past few days.  Associated with intermittent productive cough with greenish phlegm. In ED started treatment for COPD exacerbation.   6/3: lasix 40 mg IV dosed, restarted bidil due to HTN. Entresto continued to be held due to elevated Cr.

## 2022-03-22 DIAGNOSIS — J441 Chronic obstructive pulmonary disease with (acute) exacerbation: Secondary | ICD-10-CM

## 2022-03-22 LAB — BASIC METABOLIC PANEL
Anion gap: 9 (ref 5–15)
BUN: 24 mg/dL — ABNORMAL HIGH (ref 8–23)
CO2: 23 mmol/L (ref 22–32)
Calcium: 8.1 mg/dL — ABNORMAL LOW (ref 8.9–10.3)
Chloride: 101 mmol/L (ref 98–111)
Creatinine, Ser: 1.45 mg/dL — ABNORMAL HIGH (ref 0.61–1.24)
GFR, Estimated: 52 mL/min — ABNORMAL LOW (ref >60)
Glucose, Bld: 140 mg/dL — ABNORMAL HIGH (ref 70–99)
Potassium: 3.6 mmol/L (ref 3.5–5.1)
Sodium: 133 mmol/L — ABNORMAL LOW (ref 135–145)

## 2022-03-22 LAB — GLUCOSE, CAPILLARY
Glucose-Capillary: 134 mg/dL — ABNORMAL HIGH (ref 70–99)
Glucose-Capillary: 152 mg/dL — ABNORMAL HIGH (ref 70–99)
Glucose-Capillary: 139 mg/dL — ABNORMAL HIGH (ref 70–99)

## 2022-03-22 LAB — IRON AND TIBC
Iron: 44 ug/dL — ABNORMAL LOW (ref 45–182)
Saturation Ratios: 15 % — ABNORMAL LOW (ref 17.9–39.5)
TIBC: 294 ug/dL (ref 250–450)
UIBC: 250 ug/dL

## 2022-03-22 LAB — BRAIN NATRIURETIC PEPTIDE: B Natriuretic Peptide: 362.5 pg/mL — ABNORMAL HIGH (ref 0.0–100.0)

## 2022-03-22 LAB — MAGNESIUM: Magnesium: 2.1 mg/dL (ref 1.7–2.4)

## 2022-03-22 MED ORDER — HYDRALAZINE HCL 20 MG/ML IJ SOLN
10.0000 mg | Freq: Four times a day (QID) | INTRAMUSCULAR | Status: DC | PRN
  Administered 2022-03-22: 10 mg via INTRAVENOUS
  Filled 2022-03-22: qty 1

## 2022-03-22 MED ORDER — SALINE SPRAY 0.65 % NA SOLN
1.0000 | NASAL | Status: DC | PRN
  Administered 2022-03-22: 1 via NASAL
  Filled 2022-03-22: qty 44

## 2022-03-22 MED ORDER — CARVEDILOL 3.125 MG PO TABS: 3.1250 mg | ORAL_TABLET | Freq: Two times a day (BID) | ORAL | 0 refills | Status: DC

## 2022-03-22 MED ORDER — SACUBITRIL-VALSARTAN 24-26 MG PO TABS
1.0000 | ORAL_TABLET | Freq: Two times a day (BID) | ORAL | Status: DC
  Administered 2022-03-22: 1 via ORAL
  Filled 2022-03-22: qty 1

## 2022-03-22 MED ORDER — DOXYCYCLINE MONOHYDRATE 100 MG PO TABS: 100.0000 mg | ORAL_TABLET | Freq: Two times a day (BID) | ORAL | 0 refills | Status: DC

## 2022-03-22 MED ORDER — METOPROLOL SUCCINATE ER 25 MG PO TB24
25.0000 mg | ORAL_TABLET | Freq: Every day | ORAL | Status: DC
  Filled 2022-03-22: qty 1

## 2022-03-22 MED ORDER — ISOSORBIDE MONONITRATE ER 30 MG PO TB24: 15.0000 mg | ORAL_TABLET | Freq: Every day | ORAL | Status: DC

## 2022-03-22 MED ORDER — CARVEDILOL 3.125 MG PO TABS
3.1250 mg | ORAL_TABLET | Freq: Two times a day (BID) | ORAL | Status: DC
  Administered 2022-03-22: 3.125 mg via ORAL
  Filled 2022-03-22: qty 1

## 2022-03-22 MED ORDER — PREDNISONE 50 MG PO TABS: 50.0000 mg | ORAL_TABLET | Freq: Every day | ORAL | 0 refills | Status: DC

## 2022-03-22 NOTE — Discharge Summary (Addendum)
PatientPhysician Discharge Summary  Marcus Tran [id number] DOB: 17-Sep-1951 DOA: 03/20/2022  PCP: Cipriano Mile, NP  Admit date: 03/20/2022 Discharge date: 03/22/2022 30 Day Unplanned Readmission Risk Score    Flowsheet Row ED to Hosp-Admission (Current) from 03/20/2022 in [facility]  30 Day Unplanned Readmission Risk Score (%) 18.57 Filed at 03/22/2022 0801       This score is the patient's risk of an unplanned readmission within 30 days of being discharged (0 -100%). The score is based on dignosis, age, lab data, medications, orders, and past utilization.   Low:  0-14.9   Medium: 15-21.9   High: 22-29.9   Extreme: 30 and above          Admitted From: Home Disposition: Home  Recommendations for Outpatient Follow-up:  Follow up with PCP in 1-2 weeks Please obtain BMP/CBC in one week Please follow up with your PCP on the following pending results: Unresulted Labs (From admission, onward)     Start     Ordered   03/27/22 0500  Creatinine, serum  (enoxaparin (LOVENOX)    CrCl >/= 30 ml/min)  Weekly,   R     Comments: while on enoxaparin therapy    03/20/22 2125   03/21/22 1545  Urea nitrogen, urine  Once,   R        03/21/22 1544   03/21/22 [id number]  Basic metabolic panel  Daily,   R      03/20/22 [place]: None Equipment/Devices: None  Discharge Condition: Stable CODE STATUS: Full code Diet recommendation: Cardiac/low-sodium  Subjective: Seen and examined.  Patient feels well.  He denies any chest pain or shortness of breath and was saturating well over 95% on room air.  Brief/Interim Summary: Marcus Tran is a 71 y.o. male with medical history significant for combined systolic and diastolic CHF, hypertension, hyperlipidemia, tobacco use disorder, who presented to [facility] ED from PCPs office due to hypoxia.  Recently admitted on 03/06/2022 and discharged on 03/11/2022 after being treated for acute metabolic encephalopathy due to  hypoxia/hypercarbia in the setting of suspected underlying COPD, and diagnosed with new onset combined CHF.  The patient endorsed dyspnea with minimal exertion for the past few days.  Associated with intermittent productive cough with greenish phlegm. In ED started treatment for COPD exacerbation and admitted to hospitalist service with a diagnosis of acute hypoxic respiratory failure secondary to acute COPD exacerbation however he was also thought to be having some component of acute on chronic combined systolic and diastolic congestive failure based on elevated BNP however his chest x-ray did not show vascular congestion.  He received IV Lasix.  Patient has now improved significantly, not hypoxic on room air at rest or with activity.  Not requiring any home oxygen.  Patient agreeable for discharge.  He is being discharged on 4 more days of oral doxycycline and prednisone. Patient's BP was elevated on the day of dc, we resumed his enteresto and replaced toprol xl to coreg, BP was within normal range at the time of discharge, based on this, toprol xl discontinued and we prescribed him corg, he will resume rest of his HF medications.     CKD stage IIIa: Please note that patient did not have any AKI, his creatinine is at his baseline range of CKD stage IIIa.  DM2 Recent A1c 6.9. -Atorvastatin 80 mg -On sliding scale insulin here.  Will resume  home medications.   Hyponatremia, resolved   Elevated troponin, suspect demand ischemia in the setting of acute hypoxia Denies any anginal symptoms. High-sensitivity troponin peaked at 40. No evidence of acute ischemia on twelve-lead EKG. Continue to monitor on    Tobacco use disorder Tobacco cessation counseled on at bedside Endorses cutting down tobacco use, now 1 to 2 cigarettes/day  Discharge plan was discussed with patient and/or family member and they verbalized understanding and agreed with it.  Discharge Diagnoses:  Principal Problem:   Acute  respiratory failure with hypoxia [facility]) Active Problems:   Coronary artery disease   Acute on chronic combined systolic and diastolic CHF (congestive heart failure) (HCC)   COPD with acute exacerbation ([place])    Discharge Instructions   Allergies as of 03/22/2022   No Known Allergies      Medication List     STOP taking these medications    Breztri Aerosphere 160-9-4.8 MCG/ACT Aero Generic drug: Budeson-Glycopyrrol-Formoterol   metoprolol succinate 25 MG 24 hr tablet Commonly known as: TOPROL-XL       TAKE these medications    albuterol 108 (90 Base) MCG/ACT inhaler Commonly known as: VENTOLIN HFA Inhale 1-2 puffs into the lungs every 4 (four) hours as needed for wheezing or shortness of breath.   Aspirin Low Dose 81 MG chewable tablet Generic drug: aspirin Chew 1 tablet (81 mg total) by mouth daily.   atorvastatin 80 MG tablet Commonly known as: LIPITOR Take 80 mg by mouth every evening.   carvedilol 3.125 MG tablet Commonly known as: COREG Take 1 tablet (3.125 mg total) by mouth 2 (two) times daily with a meal. Start taking on: March 23, 2022   doxycycline 100 MG tablet Commonly known as: ADOXA Take 1 tablet (100 mg total) by mouth 2 (two) times daily for 4 days.   Entresto 24-26 MG Generic drug: sacubitril-valsartan Take 1 tablet by mouth 2 (two) times daily.   Farxiga 10 MG Tabs tablet Generic drug: dapagliflozin propanediol Take 1 tablet (10 mg total) by mouth daily.   furosemide 20 MG tablet Commonly known as: LASIX Take 1 tablet (20 mg total) by mouth daily.   ipratropium-albuterol 0.5-2.5 (3) MG/3ML Soln Commonly known as: DUONEB Take 3 mLs by nebulization every 6 (six) hours as needed (shortness of breath).   isosorbide-hydrALAZINE 20-37.5 MG tablet Commonly known as: BiDil Take 1 tablet by mouth 3 (three) times daily.   nitroGLYCERIN 0.4 MG SL tablet Commonly known as: NITROSTAT Place 1 tablet (0.4 mg total) under the tongue every 5  (five) minutes as needed for chest pain.   potassium chloride 10 MEQ tablet Commonly known as: KLOR-CON M Take 1 tablet (10 mEq total) by mouth daily.   predniSONE 50 MG tablet Commonly known as: DELTASONE Take 1 tablet (50 mg total) by mouth daily with breakfast for 4 days.         Follow-up Information     Cipriano Mile, NP Follow up in 1 week(s).   Contact information: [place] [address] [id number]         Minus Breeding, MD .   Specialty: Cardiology Contact information: [address] [place] [address] [phone]                No Known Allergies  Consultations: None   Procedures/Studies: CT Angio Chest PE W/Cm &/Or Wo Cm  Result Date: 03/20/2022 CLINICAL DATA:  Pulmonary embolism (PE) suspected, neg D-dimer Shortness of breath and cough.  Hypoxia.  EXAM: CT ANGIOGRAPHY CHEST WITH CONTRAST TECHNIQUE: Multidetector CT imaging of the chest was performed using the standard protocol during bolus administration of intravenous contrast. Multiplanar CT image reconstructions and MIPs were obtained to evaluate the vascular anatomy. RADIATION DOSE REDUCTION: This exam was performed according to the departmental dose-optimization program which includes automated exposure control, adjustment of the mA and/or kV according to patient size and/or use of iterative reconstruction technique. CONTRAST:  36mL OMNIPAQUE IOHEXOL 350 MG/ML SOLN COMPARISON:  Radiograph earlier today.  Chest CTA 2 weeks ago FINDINGS: Cardiovascular: There are no filling defects within the pulmonary arteries to suggest pulmonary embolus. Mild atherosclerosis of the thoracic aorta. Mild multi chamber cardiomegaly. There is contrast refluxing into the hepatic veins and IVC. No pericardial effusion. Mediastinum/Nodes: Shotty mediastinal and hilar lymph nodes, all less than 15 mm short axis. Similar findings on prior exam. Tiny hiatal hernia. No suspicious thyroid nodule.  Lungs/Pleura: Mild emphysema. There is moderate bronchial thickening, areas of mucoid impaction and mucous plugging within both lower lobes. The previous right lower lobe pulmonary nodule has resolved from prior exam consistent with infectious or inflammatory etiology. Improve subpleural interstitial thickening and diminished tiny subpleural nodules from prior. There is no acute or confluent airspace disease. No pleural effusion. Upper Abdomen: Contrast refluxes into the hepatic veins. Splenic flexure colonic diverticulosis incidentally noted. There is a small hiatal hernia. Musculoskeletal: Mild thoracic spondylosis with spurring. There are no acute or suspicious osseous abnormalities. Review of the MIP images confirms the above findings. IMPRESSION: 1. No pulmonary embolus. 2. Moderate bronchial thickening, areas of mucoid impaction and mucous plugging within both lower lobes, also seen on prior exam. 3. Mild multi chamber cardiomegaly with contrast refluxing into the hepatic veins and IVC consistent with elevated right heart pressures. 4. Previous right lower lobe pulmonary nodule has resolved from prior exam consistent with infectious or inflammatory etiology. Aortic Atherosclerosis (ICD10-I70.0) and Emphysema (ICD10-J43.9). Electronically Signed   By: Keith Rake M.D.   On: 03/20/2022 19:44   CT Angio Chest PE W and/or Wo Contrast  Result Date: 03/06/2022 CLINICAL DATA:  Shortness of breath and chest pain. EXAM: CT ANGIOGRAPHY CHEST WITH CONTRAST TECHNIQUE: Multidetector CT imaging of the chest was performed using the standard protocol during bolus administration of intravenous contrast. Multiplanar CT image reconstructions and MIPs were obtained to evaluate the vascular anatomy. RADIATION DOSE REDUCTION: This exam was performed according to the departmental dose-optimization program which includes automated exposure control, adjustment of the mA and/or kV according to patient size and/or use of  iterative reconstruction technique. CONTRAST:  11mL OMNIPAQUE IOHEXOL 350 MG/ML SOLN COMPARISON:  Only comparison is portable chest earlier today. FINDINGS: Cardiovascular: Mild panchamber cardiomegaly with slightly elevated RV/LV ratio of 1.06, IVC and hepatic vein reflux suggesting right heart strain versus tricuspid regurgitation. The pulmonary arteries are upper-normal in caliber and do not show appreciable embolic filling defects. There are scattered three-vessel coronary artery calcifications in the LAD and circumflex. There is no pericardial effusion. The superior pulmonary veins mildly distended. There is mild aortic atherosclerosis tortuosity without aneurysm or dissection. The great vessels are not well enough opacified to evaluate. Mediastinum/Nodes: There are mildly prominent bilateral hilar lymph nodes up to 1.2 cm in short axis on the right, up to 1.1 cm in short axis on the left. There are similar mildly prominent subcarinal and precarinal mediastinal nodes. No thyroid or axillary masses seen. Trachea is patent. Lungs/Pleura: There is no pleural effusion, thickening or pneumothorax. Some respiratory motion is seen on exam.  There is interstitial thickening in the bases which could be due to chronic interstitial disease, interstitial pneumonitis or mild interstitial edema, but as above there are no pleural effusions. The upper lobes show early centrilobular emphysematous changes. Central airways are small caliber there are thickened bronchial walls in the upper and lower lobes, to a lesser extent in the right middle lobe with partial mucous plugging of some of the segmental left lower lobe bronchi and a few small bronchial impactions both posterior basal lower lobes. There is a 6 mm noncalcified posterior basal right lower lobe nodule on 6:124. There are several additional bilateral scattered tiny subpleural nodules up to 3 mm in the lower lung fields. Evidence of scattered air trapping noted in the  basal segments of the lower lobes but no focal pneumonia. Upper Abdomen: No acute abnormality. Musculoskeletal: There are mild degenerative changes of the thoracic spine. No concerning regional skeletal lesion. Review of the MIP images confirms the above findings. IMPRESSION: 1. Upper-normal caliber pulmonary arteries without evidence of embolus. 2. Cardiomegaly, slightly elevated RV/LV ratio and IVC and hepatic vein reflux, which could be seen with right heart dysfunction/strain or tricuspid regurgitation. 3. There are mildly prominent superior pulmonary veins, and there is interstitial thickening in the base of the lungs which could be due to chronic change, interstitial pneumonitis or interstitial edema. There is no pleural effusion. 4. Bronchial thickening in all lobes compatible with bronchitis with additional evidence of small airways disease and small bronchial impactions in the posterior bases as well as mucoid impaction in some of the left lower lobe segmental bronchi. 5. Small caliber central airways consistent with bronchospasm or respiratory phase. 6. Evidence of air trapping in the lower lobe basal segments but no focal pneumonia. 7. Mildly prominent hilar and mediastinal nodes. No bulky or encasing adenopathy. 8. 6 mm right lower lobe nodule and a few scattered up to 3 mm subpleural interstitial micronodules in the lower lung fields. Recommend a non-contrast Chest CT at 6-12 months, then another non-contrast Chest CT at 18-24 months. These guidelines do not apply to immunocompromised patients and patients with cancer. Follow up in patients with significant comorbidities as clinically warranted. For lung cancer screening, adhere to Lung-RADS guidelines. Reference: Radiology. 2017; 284(1):228-43. 9. Aortic and coronary artery atherosclerosis. Electronically Signed   By: Telford Nab M.D.   On: 03/06/2022 07:23   CARDIAC CATHETERIZATION  Result Date: 03/09/2022 Images from the original result were  not included.   1st Mrg lesion is 50% stenosed. YANCE SEABORNE is a 71 y.o. male  [id number] LOCATION:  FACILITY: [facility] PHYSICIAN: Quay Burow, M.D. [birth year] DATE OF PROCEDURE:  03/09/2022 DATE OF DISCHARGE: CARDIAC CATHETERIZATION History obtained from chart review.71 y.o. male with prior history of tobacco smoking, no regular follow-up who admitted for acute hypoxic respiratory failure with suspected underlying COPD and heart failure exacerbation.  PROCEDURE DESCRIPTION: The patient was brought to the second floor [facility]iac cath lab in the postabsorptive state. He was premedicated with IV Versed and fentanyl. His right wrist and antecubital fossa Were prepped and shaved in usual sterile fashion. Xylocaine 1% was used for local anesthesia. A 6 French sheath was inserted into the right radial artery using standard Seldinger technique.  A 5 French sheath was inserted into the right antecubital vein.  A 5 French balloontipped Swan-Ganz catheter was then advanced to the right heart chambers obtaining sequential pressures and pulmonary artery blood samples for the determination of Fick cardiac output.  A 5 [place] TIG  catheter and right Judkins catheters were used for selective coronary angiography and obtaining left heart pressures.  Isovue dye is used for the entirety of the case (40 cc contrast total to patient).  Retrograde aortic, left ventricular and pullback pressures were recorded.  Radial cocktail was administered via the SideArm sheath.  The patient received 4000 units  of heparin intravenously.  HEMODYNAMICS:  1: Right atrial pressure-18/4 2: Right ventricular pressure-55/10 3: Pulmonary artery pressure-54/29, mean 37 4: Pulmonary wedge pressure-A-wave 24, V wave 25, mean 23 5: LVEDP-22 6: Cardiac output-3.8 L/min with an index of 2.1 L/min/m.   Mr. Hubers has a nonischemic cardiomyopathy with elevated filling pressures.  He still appears somewhat wet hemodynamically.  He will require additional  diuresis and guideline directed optimal medical therapy.  Given his serum creatinine, I do not think he is a candidate for Entresto and/or ACE/ARB.  The radial sheath was removed and a TR band was placed on the right wrist to achieve patent hemostasis.  The patient left lab in stable condition.  Dr. Irish Lack was notified of these results. Quay Burow. MD, [facility] 03/09/2022 4:53 PM    DG Chest Portable 1 View  Result Date: 03/20/2022 CLINICAL DATA:  cough EXAM: PORTABLE CHEST 1 VIEW COMPARISON:  Chest x-ray 03/06/2022, CT chest 03/06/2022 FINDINGS: Cardiomegaly.  The heart and mediastinal contours are unchanged. No focal consolidation. No pulmonary edema. No pleural effusion. No pneumothorax. No acute osseous abnormality. IMPRESSION: Cardiomegaly with no radiographic findings to suggest acute cardiopulmonary abnormality. Electronically Signed   By: Iven Finn M.D.   On: 03/20/2022 17:35   DG Chest Port 1 View  Result Date: 03/06/2022 CLINICAL DATA:  71 year old male with history of severe shortness of breath. EXAM: PORTABLE CHEST 1 VIEW COMPARISON:  No priors. FINDINGS: Lung volumes are normal. No consolidative airspace disease. No pleural effusions. Interstitial prominence is noted throughout the mid to lower lungs bilaterally where there is some mild peribronchial cuffing. No pneumothorax. No evidence of pulmonary edema. However, there is severe fullness of the hilar regions bilaterally, the appearance of which is favored to reflect dilated central pulmonary arteries. Heart size is mildly enlarged. Upper mediastinal contours are within normal limits. IMPRESSION: 1. Abnormal chest x-ray, without prior studies available for comparison. Follow-up chest CT should be considered to better evaluate the above findings. This could be performed as a high-resolution chest CT if there is clinical concern for interstitial lung disease, or could be performed as a PE protocol CT scan if there is any clinical concern  for pulmonary embolism. Electronically Signed   By: Vinnie Langton M.D.   On: 03/06/2022 05:47   ECHOCARDIOGRAM COMPLETE  Result Date: 03/06/2022    ECHOCARDIOGRAM REPORT   Patient Name:   NORVILLE NOTT Date of Exam: 03/06/2022 Medical Rec #:  [id number]    Height:       65.0 in Accession #:    [id number]   Weight:       156.0 lb Date of Birth:  October 10, 1951    BSA:          1.780 m Patient Age:    71 years     BP:           182/131 mmHg Patient Gender: M            HR:           96 bpm. Exam Location:  Inpatient Procedure: 2D Echo, Color Doppler and Cardiac Doppler Indications:    [id number] Acute systolic (  congestive) heart failure  History:        Patient has no prior history of Echocardiogram examinations.  Sonographer:    Raquel Sarna Senior RDCS Referring Phys: [phone] GRACE E BOWSER  Sonographer Comments: Scanned supine on bipap IMPRESSIONS  1. Left ventricular ejection fraction, by estimation, is 35%. The left ventricle has moderately decreased function. The left ventricle demonstrates global hypokinesis. Left ventricular diastolic parameters are consistent with Grade II diastolic dysfunction (pseudonormalization).  2. Right ventricular systolic function is normal. The right ventricular size is normal. There is moderately elevated pulmonary artery systolic pressure. The estimated right ventricular systolic pressure is 0000000 mmHg.  3. Left atrial size was mildly dilated.  4. Right atrial size was mildly dilated.  5. The mitral valve is degenerative. Trivial mitral valve regurgitation. No evidence of mitral stenosis. Moderate mitral annular calcification.  6. The aortic valve is tricuspid. There is mild calcification of the aortic valve. Aortic valve regurgitation is not visualized. No aortic stenosis is present.  7. The inferior vena cava is normal in size with <50% respiratory variability, suggesting right atrial pressure of 8 mmHg. FINDINGS  Left Ventricle: Left ventricular ejection fraction, by estimation, is 35%.  The left ventricle has moderately decreased function. The left ventricle demonstrates global hypokinesis. The left ventricular internal cavity size was normal in size. There is no left ventricular hypertrophy. Left ventricular diastolic parameters are consistent with Grade II diastolic dysfunction (pseudonormalization). Right Ventricle: The right ventricular size is normal. No increase in right ventricular wall thickness. Right ventricular systolic function is normal. There is moderately elevated pulmonary artery systolic pressure. The tricuspid regurgitant velocity is 3.39 m/s, and with an assumed right atrial pressure of 8 mmHg, the estimated right ventricular systolic pressure is 0000000 mmHg. Left Atrium: Left atrial size was mildly dilated. Right Atrium: Right atrial size was mildly dilated. Pericardium: There is no evidence of pericardial effusion. Mitral Valve: The mitral valve is degenerative in appearance. There is mild calcification of the mitral valve leaflet(s). Moderate mitral annular calcification. Trivial mitral valve regurgitation. No evidence of mitral valve stenosis. Tricuspid Valve: The tricuspid valve is normal in structure. Tricuspid valve regurgitation is mild. Aortic Valve: The aortic valve is tricuspid. There is mild calcification of the aortic valve. Aortic valve regurgitation is not visualized. No aortic stenosis is present. Pulmonic Valve: The pulmonic valve was normal in structure. Pulmonic valve regurgitation is not visualized. Aorta: The aortic root is normal in size and structure. Venous: The inferior vena cava is normal in size with less than 50% respiratory variability, suggesting right atrial pressure of 8 mmHg. IAS/Shunts: No atrial level shunt detected by color flow Doppler.  LEFT VENTRICLE PLAX 2D LVIDd:         5.40 cm     Diastology LVIDs:         4.80 cm     LV e' medial:    4.43 cm/s LV PW:         1.00 cm     LV E/e' medial:  15.7 LV IVS:        0.70 cm     LV e' lateral:    8.85 cm/s LVOT diam:     2.10 cm     LV E/e' lateral: 7.9 LV SV:         50 LV SV Index:   28 LVOT Area:     3.46 cm  LV Volumes (MOD) LV vol d, MOD A2C: 96.4 ml LV vol d, MOD A4C: 94.1 ml  LV vol s, MOD A2C: 68.5 ml LV vol s, MOD A4C: 58.6 ml LV SV MOD A2C:     27.9 ml LV SV MOD A4C:     94.1 ml LV SV MOD BP:      31.1 ml RIGHT VENTRICLE RV S prime:     13.20 cm/s TAPSE (M-mode): 2.1 cm LEFT ATRIUM             Index        RIGHT ATRIUM           Index LA diam:        3.90 cm 2.19 cm/m   RA Area:     20.40 cm LA Vol (A2C):   49.6 ml 27.87 ml/m  RA Volume:   64.40 ml  36.18 ml/m LA Vol (A4C):   49.8 ml 27.98 ml/m LA Biplane Vol: 49.9 ml 28.04 ml/m  AORTIC VALVE LVOT Vmax:   85.10 cm/s LVOT Vmean:  62.100 cm/s LVOT VTI:    0.145 m  AORTA Ao Root diam: 3.30 cm MITRAL VALVE               TRICUSPID VALVE MV Area (PHT): 4.04 cm    TR Peak grad:   46.0 mmHg MV Decel Time: 188 msec    TR Vmax:        339.00 cm/s MV E velocity: 69.50 cm/s MV A velocity: 50.70 cm/s  SHUNTS MV E/A ratio:  1.37        Systemic VTI:  0.14 m                            Systemic Diam: 2.10 cm Dalton McleanMD Electronically signed by Franki Monte Signature Date/Time: 03/06/2022/6:13:37 PM    Final      Discharge Exam: Vitals:   03/22/22 1353 03/22/22 1550  BP:  121/88  Pulse:    Resp:    Temp:    SpO2: 94%    Vitals:   03/22/22 1200 03/22/22 1344 03/22/22 1353 03/22/22 1550  BP: (!) 160/115 (!) 161/110  121/88  Pulse:      Resp:      Temp:      TempSrc:      SpO2:   94%   Weight:      Height:        General: Pt is alert, awake, not in acute distress Cardiovascular: RRR, S1/S2 +, no rubs, no gallops Respiratory: CTA bilaterally, no wheezing, no rhonchi Abdominal: Soft, NT, ND, bowel sounds + Extremities: no edema, no cyanosis    The results of significant diagnostics from this hospitalization (including imaging, microbiology, ancillary and laboratory) are listed below for reference.     Microbiology: No  results found for this or any previous visit (from the past 240 hour(s)).   Labs: BNP (last 3 results) Recent Labs    03/06/22 0503 03/20/22 1650 03/22/22 0906  BNP 1,211.4* 1,222.6* [id number]*   Basic Metabolic Panel: Recent Labs  Lab 03/20/22 1650 03/21/22 0347 03/22/22 0441  NA 134* 135 133*  K 4.3 4.7 3.6  CL 99 99 101  CO2 26 27 23   GLUCOSE 92 169* 140*  BUN 23 24* 24*  CREATININE 1.77* 1.60* 1.45*  CALCIUM 9.1 9.0 8.1*  MG  --  2.3 2.1  PHOS  --  4.5  --    Liver Function Tests: No results for input(s): AST, ALT, ALKPHOS, BILITOT, PROT, ALBUMIN in the last 168 hours.  No results for input(s): LIPASE, AMYLASE in the last 168 hours. No results for input(s): AMMONIA in the last 168 hours. CBC: Recent Labs  Lab 03/20/22 1650 03/21/22 0347  WBC 7.2 6.0  NEUTROABS 3.8 5.3  HGB 16.0 16.2  HCT 48.9 49.2  MCV 91.7 90.3  PLT 213 209   Cardiac Enzymes: No results for input(s): CKTOTAL, CKMB, CKMBINDEX, TROPONINI in the last 168 hours. BNP: Invalid input(s): POCBNP CBG: Recent Labs  Lab 03/21/22 1109 03/21/22 1529 03/21/22 2134 03/22/22 0606 03/22/22 1046  GLUCAP 162* 139* 148* 134* 152*   D-Dimer No results for input(s): DDIMER in the last 72 hours. Hgb A1c No results for input(s): HGBA1C in the last 72 hours. Lipid Profile No results for input(s): CHOL, HDL, LDLCALC, TRIG, CHOLHDL, LDLDIRECT in the last 72 hours. Thyroid function studies No results for input(s): TSH, T4TOTAL, T3FREE, THYROIDAB in the last 72 hours.  Invalid input(s): FREET3 Anemia work up Recent Labs    03/22/22 0441  TIBC 294  IRON 44*   Urinalysis    Component Value Date/Time   COLORURINE STRAW (A) 03/21/2022 [place] 03/21/2022 1545   LABSPEC 1.005 03/21/2022 1545   PHURINE 5.0 03/21/2022 1545   GLUCOSEU >=500 (A) 03/21/2022 1545   HGBUR NEGATIVE 03/21/2022 1545   BILIRUBINUR NEGATIVE 03/21/2022 1545   KETONESUR NEGATIVE 03/21/2022 1545   PROTEINUR  NEGATIVE 03/21/2022 1545   NITRITE NEGATIVE 03/21/2022 1545   LEUKOCYTESUR NEGATIVE 03/21/2022 1545   Sepsis Labs Invalid input(s): PROCALCITONIN,  WBC,  LACTICIDVEN Microbiology No results found for this or any previous visit (from the past 240 hour(s)).   Time coordinating discharge: Over 30 minutes  SIGNED:   Darliss Cheney, MD  [facility] 03/22/2022, 3:56 PM *Please note that this is a verbal dictation therefore any spelling or grammatical errors are due to the "[place] [employer]" system interpretation. If 7PM-7AM, please contact night-coverage [email or web address]

## 2022-03-22 NOTE — Progress Notes (Signed)
SATURATION QUALIFICATIONS: (This note is used to comply with regulatory documentation for home oxygen)  Patient Saturations on Room Air at Rest = 98%  Patient Saturations on Room Air while Ambulating = 93%  Patient Saturations on n/a Liters of oxygen while Ambulating = n/a%

## 2022-03-22 NOTE — Progress Notes (Addendum)
Patient is ready for discharge, he have no body to pick him up, MD aware, tried to page Misty Stanley social worker x2 and secure text x2 to get a taxi voucher without response.    Patient just left at 1845, his daughter came to pick him up.

## 2022-03-22 NOTE — Progress Notes (Signed)
Mobility Specialist Progress Note:   03/22/22 1140  Mobility  Activity Ambulated with assistance in hallway  Level of Assistance Independent  Assistive Device None  Distance Ambulated (ft) 280 ft  Activity Response Tolerated well  $Mobility charge 1 Mobility   Pt received in bed willing to participate in mobility. No complaints of pain. Left EOB with call bell in reach and all needs met.   [facility] Delan Ksiazek Mobility Specialist

## 2022-03-22 NOTE — Plan of Care (Signed)
  Problem: Education: Goal: Ability to describe self-care measures that may prevent or decrease complications (Diabetes Survival Skills Education) will improve Outcome: Progressing   Problem: Fluid Volume: Goal: Ability to maintain a balanced intake and output will improve Outcome: Progressing   Problem: Skin Integrity: Goal: Risk for impaired skin integrity will decrease Outcome: Progressing   Problem: Activity: Goal: Risk for activity intolerance will decrease Outcome: Progressing   Problem: Pain Managment: Goal: General experience of comfort will improve Outcome: Progressing   Problem: Safety: Goal: Ability to remain free from injury will improve Outcome: Progressing

## 2022-03-22 NOTE — Progress Notes (Incomplete)
HEART & VASCULAR TRANSITION OF CARE CONSULT NOTE     Referring Physician: Primary Care: Primary Cardiologist:  HPI: Referred to clinic by *** for heart failure consultation.   Cardiac Testing    Review of Systems: [y] = yes, [ ]  = no   General: Weight gain [ ] ; Weight loss [ ] ; Anorexia [ ] ; Fatigue [ ] ; Fever [ ] ; Chills [ ] ; Weakness [ ]   Cardiac: Chest pain/pressure [ ] ; Resting SOB [ ] ; Exertional SOB [ ] ; Orthopnea [ ] ; Pedal Edema [ ] ; Palpitations [ ] ; Syncope [ ] ; Presyncope [ ] ; Paroxysmal nocturnal dyspnea[ ]   Pulmonary: Cough [ ] ; Wheezing[ ] ; Hemoptysis[ ] ; Sputum [ ] ; Snoring [ ]   GI: Vomiting[ ] ; Dysphagia[ ] ; Melena[ ] ; Hematochezia [ ] ; Heartburn[ ] ; Abdominal pain [ ] ; Constipation [ ] ; Diarrhea [ ] ; BRBPR [ ]   GU: Hematuria[ ] ; Dysuria [ ] ; Nocturia[ ]   Vascular: Pain in legs with walking [ ] ; Pain in feet with lying flat [ ] ; Non-healing sores [ ] ; Stroke [ ] ; TIA [ ] ; Slurred speech [ ] ;  Neuro: Headaches[ ] ; Vertigo[ ] ; Seizures[ ] ; Paresthesias[ ] ;Blurred vision [ ] ; Diplopia [ ] ; Vision changes [ ]   Ortho/Skin: Arthritis [ ] ; Joint pain [ ] ; Muscle pain [ ] ; Joint swelling [ ] ; Back Pain [ ] ; Rash [ ]   Psych: Depression[ ] ; Anxiety[ ]   Heme: Bleeding problems [ ] ; Clotting disorders [ ] ; Anemia [ ]   Endocrine: Diabetes [ ] ; Thyroid dysfunction[ ]    Past Medical History:  Diagnosis Date   CHF (congestive heart failure) (HCC)    Dyslipidemia    Hypertension     No current facility-administered medications for this visit.   No current outpatient medications on file.   Facility-Administered Medications Ordered in Other Visits  Medication Dose Route Frequency Provider Last Rate Last Admin   0.9 %  sodium chloride infusion   Intravenous PRN Darlin Drop, DO   Stopped at 03/21/22 9924   aspirin chewable tablet 81 mg  81 mg Oral Daily Darlin Drop, DO   81 mg at 03/22/22 1012   atorvastatin (LIPITOR) tablet 80 mg  80 mg Oral QPM Dow Adolph N, DO    80 mg at 03/21/22 1816   cefTRIAXone (ROCEPHIN) 2 g in sodium chloride 0.9 % 100 mL IVPB  2 g Intravenous Q24H Dow Adolph N, DO   Stopped at 03/22/22 0230   dapagliflozin propanediol (FARXIGA) tablet 10 mg  10 mg Oral Daily Dow Adolph N, DO   10 mg at 03/22/22 1012   enoxaparin (LOVENOX) injection 40 mg  40 mg Subcutaneous Q24H Dow Adolph N, DO   40 mg at 03/21/22 2151   guaiFENesin (MUCINEX) 12 hr tablet 600 mg  600 mg Oral BID Dow Adolph N, DO   600 mg at 03/22/22 1012   guaiFENesin-dextromethorphan (ROBITUSSIN DM) 100-10 MG/5ML syrup 5 mL  5 mL Oral Q4H PRN Dow Adolph N, DO       insulin aspart (novoLOG) injection 0-5 Units  0-5 Units Subcutaneous QHS Hall, Carole N, DO       insulin aspart (novoLOG) injection 0-9 Units  0-9 Units Subcutaneous TID WC Dow Adolph N, DO   1 Units at 03/22/22 2683   ipratropium-albuterol (DUONEB) 0.5-2.5 (3) MG/3ML nebulizer solution 3 mL  3 mL Nebulization Q6H PRN Hall, Carole N, DO       ipratropium-albuterol (DUONEB) 0.5-2.5 (3) MG/3ML nebulizer solution 3 mL  3 mL Nebulization  TID Lorelei Pont, MD   3 mL at 03/22/22 0719   isosorbide-hydrALAZINE (BIDIL) 20-37.5 MG per tablet 1 tablet  1 tablet Oral TID Lorelei Pont, MD   1 tablet at 03/22/22 1012   MEDLINE mouth rinse  15 mL Mouth Rinse BID Irene Pap N, DO   15 mL at 03/22/22 1013   methylPREDNISolone sodium succinate (SOLU-MEDROL) 40 mg/mL injection 40 mg  40 mg Intravenous Daily Irene Pap N, DO   40 mg at 03/22/22 1012   nicotine (NICODERM CQ - dosed in mg/24 hours) patch 14 mg  14 mg Transdermal Daily Irene Pap N, DO   14 mg at 03/22/22 1013   sodium chloride (OCEAN) 0.65 % nasal spray 1 spray  1 spray Each Nare PRN Irene Pap N, DO   1 spray at 03/22/22 0534   sodium chloride HYPERTONIC 3 % nebulizer solution 4 mL  4 mL Nebulization BID Irene Pap N, DO   4 mL at 03/22/22 0719    No Known Allergies    Social History   Socioeconomic History   Marital status: Single     Spouse name: Not on file   Number of children: 1   Years of education: Not on file   Highest education level: 10th grade  Occupational History   Occupation: Disability  Tobacco Use   Smoking status: Every Day    Packs/day: 0.50    Years: 50.00    Pack years: 25.00    Types: Cigarettes   Smokeless tobacco: Not on file   Tobacco comments:    Smoking cessation  Vaping Use   Vaping Use: Never used  Substance and Sexual Activity   Alcohol use: Not on file    Comment: socially   Drug use: Yes    Types: Marijuana    Comment: past   Sexual activity: Not on file  Other Topics Concern   Not on file  Social History Narrative   He lives with his daughter apparently and 5 grandchildren.  He reports that he smokes probably less than a pack of cigarettes a day.  He occasionally drinks a beer or liquor but not daily.   Social Determinants of Health   Financial Resource Strain: Low Risk    Difficulty of Paying Living Expenses: Not very hard  Food Insecurity: No Food Insecurity   Worried About Charity fundraiser in the Last Year: Never true   Ran Out of Food in the Last Year: Never true  Transportation Needs: No Transportation Needs   Lack of Transportation (Medical): No   Lack of Transportation (Non-Medical): No  Physical Activity: Not on file  Stress: Not on file  Social Connections: Not on file  Intimate Partner Violence: Not on file     No family history on file.  There were no vitals filed for this visit.  PHYSICAL EXAM: General:  Well appearing. No respiratory difficulty HEENT: normal Neck: supple. no JVD. Carotids 2+ bilat; no bruits. No lymphadenopathy or thryomegaly appreciated. Cor: PMI nondisplaced. Regular rate & rhythm. No rubs, gallops or murmurs. Lungs: clear Abdomen: soft, nontender, nondistended. No hepatosplenomegaly. No bruits or masses. Good bowel sounds. Extremities: no cyanosis, clubbing, rash, edema Neuro: alert & oriented x 3, cranial nerves grossly  intact. moves all 4 extremities w/o difficulty. Affect pleasant.  ECG:   ASSESSMENT & PLAN:  NY[facility] *** GDMT  Diuretic- BB- Ace/ARB/ARNI MRA SGLT2i    Referred to HFSW (PCP, Medications, Transportation, ETOH Abuse, Drug Abuse, Insurance, Financial ):  Yes or No Refer to Pharmacy: Yes or No Refer to Home Health: Yes on No Refer to [facility]: Yes or no  Refer to General Cardiology: Yes or No  Follow up

## 2022-03-23 ENCOUNTER — Telehealth (HOSPITAL_COMMUNITY): Payer: Self-pay | Admitting: *Deleted

## 2022-03-23 ENCOUNTER — Encounter (HOSPITAL_COMMUNITY): Payer: Self-pay

## 2022-03-23 ENCOUNTER — Ambulatory Visit (HOSPITAL_COMMUNITY)
Admit: 2022-03-23 | Discharge: 2022-03-23 | Disposition: A | Discharge status: Home / Self Care | Payer: Medicare Other | Attending: Physician Assistant | Admitting: Physician Assistant

## 2022-03-23 VITALS — BP 148/92 | HR 87 | Wt 164.0 lb

## 2022-03-23 DIAGNOSIS — J9621 Acute and chronic respiratory failure with hypoxia: Secondary | ICD-10-CM | POA: Diagnosis not present

## 2022-03-23 DIAGNOSIS — E785 Hyperlipidemia, unspecified: Secondary | ICD-10-CM | POA: Insufficient documentation

## 2022-03-23 DIAGNOSIS — E782 Mixed hyperlipidemia: Secondary | ICD-10-CM

## 2022-03-23 DIAGNOSIS — Z7952 Long term (current) use of systemic steroids: Secondary | ICD-10-CM | POA: Diagnosis not present

## 2022-03-23 DIAGNOSIS — I5021 Acute systolic (congestive) heart failure: Secondary | ICD-10-CM | POA: Diagnosis present

## 2022-03-23 DIAGNOSIS — I110 Hypertensive heart disease with heart failure: Secondary | ICD-10-CM | POA: Insufficient documentation

## 2022-03-23 DIAGNOSIS — J441 Chronic obstructive pulmonary disease with (acute) exacerbation: Secondary | ICD-10-CM | POA: Insufficient documentation

## 2022-03-23 DIAGNOSIS — Z87891 Personal history of nicotine dependence: Secondary | ICD-10-CM | POA: Diagnosis not present

## 2022-03-23 DIAGNOSIS — I10 Essential (primary) hypertension: Secondary | ICD-10-CM

## 2022-03-23 DIAGNOSIS — Z79899 Other long term (current) drug therapy: Secondary | ICD-10-CM | POA: Insufficient documentation

## 2022-03-23 DIAGNOSIS — I2510 Atherosclerotic heart disease of native coronary artery without angina pectoris: Secondary | ICD-10-CM | POA: Diagnosis not present

## 2022-03-23 DIAGNOSIS — I5020 Unspecified systolic (congestive) heart failure: Secondary | ICD-10-CM

## 2022-03-23 DIAGNOSIS — I428 Other cardiomyopathies: Secondary | ICD-10-CM | POA: Diagnosis not present

## 2022-03-23 LAB — BASIC METABOLIC PANEL
Anion gap: 9 (ref 5–15)
BUN: 19 mg/dL (ref 8–23)
CO2: 23 mmol/L (ref 22–32)
Calcium: 8.5 mg/dL — ABNORMAL LOW (ref 8.9–10.3)
Chloride: 101 mmol/L (ref 98–111)
Creatinine, Ser: 1.45 mg/dL — ABNORMAL HIGH (ref 0.61–1.24)
GFR, Estimated: 52 mL/min — ABNORMAL LOW (ref >60)
Glucose, Bld: 157 mg/dL — ABNORMAL HIGH (ref 70–99)
Potassium: 4.2 mmol/L (ref 3.5–5.1)
Sodium: 133 mmol/L — ABNORMAL LOW (ref 135–145)

## 2022-03-23 LAB — BRAIN NATRIURETIC PEPTIDE: B Natriuretic Peptide: 314.9 pg/mL — ABNORMAL HIGH (ref 0.0–100.0)

## 2022-03-23 MED ORDER — FUROSEMIDE 20 MG PO TABS: 20.0000 mg | ORAL_TABLET | ORAL | 0 refills | Status: DC

## 2022-03-23 MED ORDER — POTASSIUM CHLORIDE CRYS ER 10 MEQ PO TBCR: 10.0000 meq | EXTENDED_RELEASE_TABLET | ORAL | 0 refills | Status: DC

## 2022-03-23 MED ORDER — ENTRESTO 49-51 MG PO TABS: 1.0000 | ORAL_TABLET | Freq: Two times a day (BID) | ORAL | 0 refills | Status: DC

## 2022-03-23 NOTE — Telephone Encounter (Signed)
Called to confirm Heart & Vascular Transitions of Care appointment at 12 noon on 03/23/22. Patient reminded to bring all medications and pill box organizer with them. Confirmed patient has transportation. Gave directions, instructed to utilize valet parking.  Confirmed appointment prior to ending call.    Rhae Hammock, BSN, Scientist, clinical (histocompatibility and immunogenetics) Only

## 2022-03-23 NOTE — Patient Instructions (Addendum)
EKG done today.  Labs done today. We will contact you only if your labs are abnormal.  DECREASE Furosemide (Laisx) to 20mg  (1 tablet) by mouth every other day(Monday,Wednesday,Friday). You may take an additional tablet as needed for swelling or a weight gain of 3 pounds or more in 24 hours.  DECREASE Potassium to 43meq(1 tablet) by mouth every other day.   INCREASE Entresto to 49-51mg  (1 tablet) by mouth 2 times daily.   No other medication changes were made. Please continue all current medications as prescribed.  Thank you for allowing [place] to provide your heart failure care after your recent hospitalization. Please keep your pending appointment tomorrow with Laurann Montana.

## 2022-03-24 ENCOUNTER — Ambulatory Visit (INDEPENDENT_AMBULATORY_CARE_PROVIDER_SITE_OTHER): Payer: Medicare Other | Admitting: Family

## 2022-03-24 ENCOUNTER — Ambulatory Visit (INDEPENDENT_AMBULATORY_CARE_PROVIDER_SITE_OTHER): Payer: Medicare Other

## 2022-03-24 ENCOUNTER — Encounter (HOSPITAL_BASED_OUTPATIENT_CLINIC_OR_DEPARTMENT_OTHER): Payer: Self-pay | Admitting: Family

## 2022-03-24 VITALS — BP 138/90 | HR 100 | Ht 65.0 in | Wt 165.0 lb

## 2022-03-24 DIAGNOSIS — R0683 Snoring: Secondary | ICD-10-CM

## 2022-03-24 DIAGNOSIS — I428 Other cardiomyopathies: Secondary | ICD-10-CM

## 2022-03-24 DIAGNOSIS — E785 Hyperlipidemia, unspecified: Secondary | ICD-10-CM | POA: Diagnosis not present

## 2022-03-24 DIAGNOSIS — I10 Essential (primary) hypertension: Secondary | ICD-10-CM | POA: Diagnosis not present

## 2022-03-24 DIAGNOSIS — Z720 Tobacco use: Secondary | ICD-10-CM

## 2022-03-24 DIAGNOSIS — I493 Ventricular premature depolarization: Secondary | ICD-10-CM

## 2022-03-24 DIAGNOSIS — I5020 Unspecified systolic (congestive) heart failure: Secondary | ICD-10-CM

## 2022-03-24 DIAGNOSIS — E876 Hypokalemia: Secondary | ICD-10-CM

## 2022-03-24 MED ORDER — ISOSORB DINITRATE-HYDRALAZINE 20-37.5 MG PO TABS: 1.0000 | ORAL_TABLET | Freq: Three times a day (TID) | ORAL | 3 refills | Status: DC

## 2022-03-24 MED ORDER — ASPIRIN 81 MG PO TBEC: 81.0000 mg | DELAYED_RELEASE_TABLET | Freq: Every day | ORAL | 3 refills | Status: DC

## 2022-03-24 MED ORDER — ATORVASTATIN CALCIUM 80 MG PO TABS: 80.0000 mg | ORAL_TABLET | Freq: Every evening | ORAL | 3 refills | Status: DC

## 2022-03-24 MED ORDER — ENTRESTO 49-51 MG PO TABS: 1.0000 | ORAL_TABLET | Freq: Two times a day (BID) | ORAL | 5 refills | Status: DC

## 2022-03-24 MED ORDER — CARVEDILOL 3.125 MG PO TABS: 3.1250 mg | ORAL_TABLET | Freq: Two times a day (BID) | ORAL | 3 refills | Status: DC

## 2022-03-24 MED ORDER — DAPAGLIFLOZIN PROPANEDIOL 10 MG PO TABS: 10.0000 mg | ORAL_TABLET | Freq: Every day | ORAL | 5 refills | Status: DC

## 2022-03-24 MED ORDER — FUROSEMIDE 20 MG PO TABS: 20.0000 mg | ORAL_TABLET | ORAL | 1 refills | Status: DC

## 2022-03-24 MED ORDER — POTASSIUM CHLORIDE CRYS ER 10 MEQ PO TBCR: 10.0000 meq | EXTENDED_RELEASE_TABLET | ORAL | 1 refills | Status: DC

## 2022-03-24 NOTE — Progress Notes (Signed)
Office Visit    Patient Name: AMOS DELAPP Date of Encounter: 03/24/2022  PCP:  Cipriano Mile, NP   [place]  Cardiologist:  Minus Breeding, MD  Advanced Practice Provider:  No care team member to display Electrophysiologist:  None      Chief Complaint    SALEH BLOODSAW is a 71 y.o. male with a hx of HFrEF, nonischemic cardiomyopathy, nonobstructive coronary artery disease, hypertension, tobacco use, suspected COPD, hyperlipidemia, DM 2  presents today for hospital follow up   Past Medical History    Past Medical History:  Diagnosis Date   CHF (congestive heart failure) ([place])    Dyslipidemia    Hypertension    Past Surgical History:  Procedure Laterality Date   LEG SURGERY     Right-sided as result of trauma   RIGHT/LEFT HEART CATH AND CORONARY ANGIOGRAPHY N/A 03/09/2022   Procedure: RIGHT/LEFT HEART CATH AND CORONARY ANGIOGRAPHY;  Surgeon: Lorretta Harp, MD;  Location: [facility];  Service: Cardiovascular;  Laterality: N/A;   Umbilicus surgery     As a child    Allergies  No Known Allergies  History of Present Illness    HUDSYN SCHIAPPA is a 71 y.o. male with a hx of HFrEF, nonischemic cardiomyopathy, nonobstructive coronary artery disease, hypertension, tobacco use, suspected COPD, hyperlipidemia, DM 2 last seen by Heart & Vascular TOC clinic.  Admitted 02/2022 with acute hypoxic respiratory failure due to suspected COPD and acute systolic heart failure. CTA chest negative for PE. Echo Ef35%, RV okay, RVSP 23mmHg,. Cardiac cath performed with nonobstructive coronary disease.  He was diuresed with IV Lasix.  Discharge weight 03/10/22 163 pounds.  Readmitted 6/2 - 03/22/2022 with acute on chronic respiratory failure with hypoxia secondary to COPD exacerbation.  Thought to also have some component of CHF.  Diuresed with IV Lasix and received doxycycline and prednisone.  Discharge weight 151 pounds.  Saw heart vascular TOC clinic yesterday  with significant improvement in dyspnea.  His daughter was arranging his meds and pillbox.  He was drinking more than 64 ounces per day and educated provided.  Due to elevated blood pressure Entresto increased to 49-51 mg twice daily, furosemide and potassium reduced to every other day.  He presents today for follow-up independently.  He lives with his daughter and 5 grandchildren who are an infant, 2, 66, 76, and 46 years old.  Since discharge denies chest pain, orthopnea, edema, PND.  His exertional dyspnea is improving and only noticeable with more than usual activity.  He does a lot of the cooking at home and has been working to use less salt.  We again reviewed fluid restriction less than 2 L (64 oz). No formal exercise routine. He has BP cuff and scale at home but did not bring readings today. Has not yet picked up new prescription from the pharmacy. \    EKGs/Labs/Other Studies Reviewed:   The following studies were reviewed today:   Echo: 03/06/22   IMPRESSIONS     1. Left ventricular ejection fraction, by estimation, is 35%. The left  ventricle has moderately decreased function. The left ventricle  demonstrates global hypokinesis. Left ventricular diastolic parameters are  consistent with Grade II diastolic  dysfunction (pseudonormalization).   2. Right ventricular systolic function is normal. The right ventricular  size is normal. There is moderately elevated pulmonary artery systolic  pressure. The estimated right ventricular systolic pressure is 0000000 mmHg.   3. Left atrial size  was mildly dilated.   4. Right atrial size was mildly dilated.   5. The mitral valve is degenerative. Trivial mitral valve regurgitation.  No evidence of mitral stenosis. Moderate mitral annular calcification.   6. The aortic valve is tricuspid. There is mild calcification of the  aortic valve. Aortic valve regurgitation is not visualized. No aortic  stenosis is present.   7. The inferior vena cava is  normal in size with <50% respiratory  variability, suggesting right atrial pressure of 8 mmHg.   FINDINGS   Left Ventricle: Left ventricular ejection fraction, by estimation, is  35%. The left ventricle has moderately decreased function. The left  ventricle demonstrates global hypokinesis. The left ventricular internal  cavity size was normal in size. There is  no left ventricular hypertrophy. Left ventricular diastolic parameters are  consistent with Grade II diastolic dysfunction (pseudonormalization).   Right Ventricle: The right ventricular size is normal. No increase in  right ventricular wall thickness. Right ventricular systolic function is  normal. There is moderately elevated pulmonary artery systolic pressure.  The tricuspid regurgitant velocity is  3.39 m/s, and with an assumed right atrial pressure of 8 mmHg, the  estimated right ventricular systolic pressure is 0000000 mmHg.   Left Atrium: Left atrial size was mildly dilated.   Right Atrium: Right atrial size was mildly dilated.   Pericardium: There is no evidence of pericardial effusion.   Mitral Valve: The mitral valve is degenerative in appearance. There is  mild calcification of the mitral valve leaflet(s). Moderate mitral annular  calcification. Trivial mitral valve regurgitation. No evidence of mitral  valve stenosis.   Tricuspid Valve: The tricuspid valve is normal in structure. Tricuspid  valve regurgitation is mild.   Aortic Valve: The aortic valve is tricuspid. There is mild calcification  of the aortic valve. Aortic valve regurgitation is not visualized. No  aortic stenosis is present.   Pulmonic Valve: The pulmonic valve was normal in structure. Pulmonic valve  regurgitation is not visualized.   Aorta: The aortic root is normal in size and structure.   Venous: The inferior vena cava is normal in size with less than 50%  respiratory variability, suggesting right atrial pressure of 8 mmHg.   IAS/Shunts:  No atrial level shunt detected by color flow Doppler.    Cath: 03/09/22   HEMODYNAMICS:    1: Right atrial pressure-18/4 2: Right ventricular pressure-55/10 3: Pulmonary artery pressure-54/29, mean 37 4: Pulmonary wedge pressure-A-wave 24, V wave 25, mean 23 5: LVEDP-22 6: Cardiac output-3.8 L/min with an index of 2.1 L/min/m.   IMPRESSION: Mr. Bomkamp has a nonischemic cardiomyopathy with elevated filling pressures.  He still appears somewhat wet hemodynamically.  He will require additional diuresis and guideline directed optimal medical therapy.  Given his serum creatinine, I do not think he is a candidate for Entresto and/or ACE/ARB.  The radial sheath was removed and a TR band was placed on the right wrist to achieve patent hemostasis.  The patient left lab in stable condition.  Dr. Irish Lack was notified of these results.    EKG: No EKG today  Recent Labs: 03/21/2022: Hemoglobin 16.2; Platelets 209 03/22/2022: Magnesium 2.1 03/23/2022: B Natriuretic Peptide 314.9; BUN 19; Creatinine, Ser 1.45; Potassium 4.2; Sodium 133  Recent Lipid Panel No results found for: CHOL, TRIG, HDL, CHOLHDL, VLDL, LDLCALC, LDLDIRECT   Home Medications   Current Meds  Medication Sig   albuterol (VENTOLIN HFA) 108 (90 Base) MCG/ACT inhaler Inhale 1-2 puffs into the lungs every 4 (  four) hours as needed for wheezing or shortness of breath.   aspirin EC 81 MG tablet Take 1 tablet (81 mg total) by mouth daily. Swallow whole.   Budeson-Glycopyrrol-Formoterol (BREZTRI AEROSPHERE) 160-9-4.8 MCG/ACT AERO Inhale 2 puffs into the lungs as needed.   [DISCONTINUED] aspirin 81 MG chewable tablet Chew 1 tablet (81 mg total) by mouth daily.   [DISCONTINUED] carvedilol (COREG) 3.125 MG tablet Take 1 tablet (3.125 mg total) by mouth 2 (two) times daily with a meal.   [DISCONTINUED] dapagliflozin propanediol (FARXIGA) 10 MG TABS tablet Take 1 tablet (10 mg total) by mouth daily.   [DISCONTINUED] doxycycline (ADOXA) 100 MG  tablet Take 1 tablet (100 mg total) by mouth 2 (two) times daily for 4 days.   [DISCONTINUED] furosemide (LASIX) 20 MG tablet Take 1 tablet (20 mg total) by mouth every other day. Take an extra tablet as needed for swelling or a weight gain of 3 pounds or more in 24hrs   [DISCONTINUED] isosorbide-hydrALAZINE (BIDIL) 20-37.5 MG tablet Take 1 tablet by mouth 3 (three) times daily.   [DISCONTINUED] metoprolol succinate (TOPROL-XL) 25 MG 24 hr tablet Take 25 mg by mouth daily.   [DISCONTINUED] potassium chloride (KLOR-CON M) 10 MEQ tablet Take 1 tablet (10 mEq total) by mouth every other day.   [DISCONTINUED] predniSONE (DELTASONE) 50 MG tablet Take 1 tablet (50 mg total) by mouth daily with breakfast for 4 days.   [DISCONTINUED] sacubitril-valsartan (ENTRESTO) 49-51 MG Take 1 tablet by mouth 2 (two) times daily.     Review of Systems      All other systems reviewed and are otherwise negative except as noted above.  Physical Exam    VS:  BP 138/90   Pulse 100   Ht 5\' 5"  (1.651 m)   Wt 165 lb (74.8 kg)   BMI 27.46 kg/m  , BMI Body mass index is 27.46 kg/m.  Wt Readings from Last 3 Encounters:  03/24/22 165 lb (74.8 kg)  03/23/22 164 lb (74.4 kg)  03/22/22 151 lb 8 oz (68.7 kg)     GEN: Well nourished, well developed, in no acute distress. HEENT: normal. Neck: Supple, no JVD, carotid bruits, or masses. Cardiac: RRR, no murmurs, rubs, or gallops. No clubbing, cyanosis, edema.  Radials/PT 2+ and equal bilaterally.  Respiratory:  Respirations regular and unlabored, clear to auscultation bilaterally. GI: Soft, nontender, nondistended. MS: No deformity or atrophy. Skin: Warm and dry, no rash. Neuro:  Strength and sensation are intact. Psych: Normal affect.  Assessment & Plan    HFrEF/NICM - Euvolemic and well compensated on exam.  GDMT includes carvedilol, Farxiga, Lasix, BiDil, Entresto.  Refills provided.  TOC clinic increase in his dose of Entresto yesterday and reduced  Lasix/potassium to every other day.  BMP in 1 week for monitoring.  If renal function and potassium stable plan to add spironolactone at that time.  Referred to cardiac rehab.Low sodium diet, fluid restriction <2L, and daily weights encouraged. Educated to contact our office for weight gain of 2 lbs overnight or 5 lbs in one week.  Plan for echocardiogram 3 months after optimization of GDMT.  If LVEF not greater than 35% plan to refer to EP for consideration of ICD at that time.  Snores / Sleep disordered breathing -Home sleep study ordered to rule out OSA as contributory to cardiomyopathy.  No [place] study as he is not confident in his ability to use mobile device for the study.   PVC - 14 day ZIO placed in clinic  today to determine PVC burden.  Concerned that PVC burden contributory to cardiomyopathy.  He reports only intermittent palpitations.  CAD -nonobstructive disease by cardiac cath 02/2022.  Stable with no anginal symptoms. GDMT includes aspirin, atorvastatin, carvedilol.  Refills provided. Heart healthy diet and regular cardiovascular exercise encouraged.    HTN  -BP not at goal.  Encouraged him to pick up increased dose of Entresto as prescribed at [facility] clinic visit yesterday.  BP goal less than 130/80.  Discussed to monitor BP at home at least 2 hours after medications and sitting for 5-10 minutes.   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 He brings Atorvastatin 40 mg tablets to clinic today but is prescribed 80 mg.  Educated provided and new prescription sent for atorvastatin 80 mg daily.  Denies myalgias.  Coordinate lipid panel at follow-up  Tobacco use / Suspected COPD - Upcoming appointment later this month to establish with pulmonology.  No signs of acute exacerbation.  Presently on Breztri and PRN albuterol. Smoking cessation encouraged. Recommend utilization of [id number].   DM2 - 03/07/22 A1c 6.9. Continue to follow with PCP.   Presently on Farxiga 10 mg daily.  Disposition: Follow up in 6 week(s) with Minus Breeding, MD or APP.  Signed, Loel Dubonnet, NP 03/24/2022, 11:48 AM [place]

## 2022-03-24 NOTE — Patient Instructions (Addendum)
Medication Instructions:   Please pick up updated prescriptions at the pharmacy. INCREASE Entresto to 49-51mg  twice daily. New prescription is at the pharmacy. You may use up your 24-26mg  tablets by taking TWO tablets twice daily. INCREASE Atorvastatin to 80mg  daily. New prescription is at the pharmacy. You may use up your 40mg  tablets by taking TWO tablets daily. REDUCE Lasix to one 20mg  tablet every other day  We disposed of the following pill bottles for you today as you are no longer taking these medications: Prednisone, Metoprolol, Doxycycline  We combined your Potassium tablets into one bottle as you had two bottles of the same medication.   *If you need a refill on your cardiac medications before your next appointment, please call your pharmacy*  Lab Work: Your physician recommends that you return for lab work in one week for BMP  Please return for Lab work. You may come to the...   [facility] (3rd floor) [address], [place], [address]  Open: 8am-Noon and 1pm-4:30pm    [facility] [address] [place]- Any location  **no appointments needed**   If you have labs (blood work) drawn today and your tests are completely normal, you will receive your results only by: [id number] (if you have MyChart) OR A paper copy in the mail If you have any lab test that is abnormal or we need to change your treatment, we will call you to review the results.   Testing/Procedures:  Your physician has recommended that you wear a Zio monitor. This will help [facility] to check how many of those early heart beats called PVC's you are having and if they are weakening your heart muscle.   This monitor is a medical device that records the heart's electrical activity. Doctors most often use these monitors to diagnose arrhythmias. Arrhythmias are problems with the speed or rhythm of the heartbeat. The monitor is a small device  applied to your chest. You can wear one while you do your normal daily activities. While wearing this monitor if you have any symptoms to push the button and record what you felt. Once you have worn this monitor for the period of time provider prescribed (Usually 14 days), you will return the monitor device in the postage paid box. Once it is returned they will download the data collected and provide [employer] with a report which the provider will then review and we will call you with those results. Important tips:  Avoid showering during the first 24 hours of wearing the monitor. Avoid excessive sweating to help maximize wear time. Do not submerge the device, no hot tubs, and no swimming pools. Keep any lotions or oils away from the patch. After 24 hours you may shower with the patch on. Take brief showers with your back facing the shower head.  Do not remove patch once it has been placed because that will interrupt data and decrease adhesive wear time. Push the button when you have any symptoms and write down what you were feeling. Once you have completed wearing your monitor, remove and place into box which has postage paid and place in your outgoing mailbox.  If for some reason you have misplaced your box then call our office and we can provide another box and/or mail it off for you.   Your physician has recommended that you have a home sleep study. This test records several body functions during sleep, including: brain activity, eye movement, oxygen  and carbon dioxide blood levels, heart rate and rhythm, breathing rate and rhythm, the flow of air through your mouth and nose, snoring, body muscle movements, and chest and belly movement.The sleep lab at [facility] will call you regarding.    Follow-Up: At [facility], you and your health needs are our priority.  As part of our continuing mission to provide you with exceptional heart care, we have created designated Provider Care Teams.  These Care  Teams include your primary Cardiologist (physician) and Advanced Practice Providers (APPs -  Physician Assistants and Nurse Practitioners) who all work together to provide you with the care you need, when you need it.  We recommend signing up for the patient portal called "MyChart".  Sign up information is provided on this After Visit Summary.  MyChart is used to connect with patients for Virtual Visits (Telemedicine).  Patients are able to view lab/test results, encounter notes, upcoming appointments, etc.  Non-urgent messages can be sent to your provider as well.   To learn more about what you can do with MyChart, go to [email or web address].    Your next appointment:   6 week(s)  The format for your next appointment:   In Person  Provider:   Rollene Rotunda, MD or Advanced Practice Provider    Other Instructions  Recommend weighing daily and keeping a log. Please call our office if you have weight gain of 2 pounds overnight or 5 pounds in 1 week.   Date  Time Weight                                            Tips to Measure your Blood Pressure Correctly  Check blood pressure once per day and keep a log. Call our office if blood pressure consistently more than 130/80. Please bring your blood pressure cuff to your next office visit.   Here's what you can do to ensure a correct reading:  Don't drink a caffeinated beverage or smoke during the 30 minutes before the test.  Sit quietly for five minutes before the test begins.  During the measurement, sit in a chair with your feet on the floor and your arm supported so your elbow is at about heart level.  The inflatable part of the cuff should completely cover at least 80% of your upper arm, and the cuff should be placed on bare skin, not over a shirt.  Don't talk during the measurement.    Heart Healthy Diet Recommendations: A low-salt diet is recommended. Meats should be grilled, baked, or boiled. Avoid fried foods.  Focus on lean protein sources like fish or chicken with vegetables and fruits.  Drink less than 64 ounces (2 liters) of fluid per day.  Exercise recommendations: The American Heart Association recommends 150 minutes of moderate intensity exercise weekly. Try 30 minutes of moderate intensity exercise 4-5 times per week. This could include walking, jogging, or swimming. Start with a 5 minute walk twice per day and gradually increase to 20mg  twice per day. We have referred you to cardiac rehab for exercise and diet teaching.

## 2022-04-03 ENCOUNTER — Encounter (HOSPITAL_COMMUNITY): Payer: Self-pay

## 2022-04-06 ENCOUNTER — Ambulatory Visit (INDEPENDENT_AMBULATORY_CARE_PROVIDER_SITE_OTHER): Payer: Medicare Other | Admitting: Pulmonary Disease

## 2022-04-06 ENCOUNTER — Encounter: Payer: Self-pay | Admitting: Pulmonary Disease

## 2022-04-06 VITALS — BP 118/66 | HR 78 | Ht 65.0 in | Wt 158.8 lb

## 2022-04-06 DIAGNOSIS — R0609 Other forms of dyspnea: Secondary | ICD-10-CM | POA: Diagnosis not present

## 2022-04-06 MED ORDER — ALBUTEROL SULFATE HFA 108 (90 BASE) MCG/ACT IN AERS
2.0000 | INHALATION_SPRAY | RESPIRATORY_TRACT | 11 refills | Status: DC | PRN
Start: 2022-04-06 — End: 2023-01-16

## 2022-04-06 NOTE — Patient Instructions (Signed)
Nice to see you again  Use Breztri 2 puffs twice a day every day.  This is the inhaler you have with you in clinic today.  Rinse your mouth out after every use.  I recommend you leave this in your home and the convenient spot recommended to take it twice a day every day.  I sent a refill in for albuterol 2 puffs every 4-6 hours as needed for shortness of breath.  This is a rescue inhaler.  I recommend you keep this in your pocket or with you wherever you go.  At your next visit, we will do pulmonary function test to see if there are reasons to be short of breath due to the lungs.  We will do the pulmonary function test at your next follow-up visit in 3 months.  Return to clinic in 3 months or sooner as needed with Dr. Judeth Horn with pulmonary function test same day

## 2022-04-14 ENCOUNTER — Telehealth: Payer: Self-pay | Admitting: Cardiology

## 2022-04-14 NOTE — Telephone Encounter (Signed)
Received call from  [facility].  Zio monitor showed complete heart block per representative on April 03, 2022 at 2:51 am.  Report is complete and the report will be download in about 30 min into the system.  Will forward to Dr Antoine Poche

## 2022-04-15 DIAGNOSIS — R0683 Snoring: Secondary | ICD-10-CM | POA: Insufficient documentation

## 2022-04-15 DIAGNOSIS — I428 Other cardiomyopathies: Secondary | ICD-10-CM | POA: Insufficient documentation

## 2022-04-15 DIAGNOSIS — E854 Organ-limited amyloidosis: Secondary | ICD-10-CM | POA: Insufficient documentation

## 2022-04-15 DIAGNOSIS — I5020 Unspecified systolic (congestive) heart failure: Secondary | ICD-10-CM | POA: Insufficient documentation

## 2022-04-15 DIAGNOSIS — I442 Atrioventricular block, complete: Secondary | ICD-10-CM | POA: Insufficient documentation

## 2022-04-15 NOTE — Telephone Encounter (Signed)
Spoke with pt, Follow up scheduled  

## 2022-04-15 NOTE — Progress Notes (Unsigned)
Cardiology Office Note   Date:  04/16/2022   ID:  Marcus, Tran [birth year], MRN [id number]  PCP:  Marcus Aldo, NP  Cardiologist:   Rollene Rotunda, MD Referring:  Marcus Aldo, NP   Chief Complaint  Patient presents with   Cough      History of Present Illness: Marcus Tran is a 71 y.o. 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  He was admitted in May with acute hypoxic respiratory failure and COPD.  He had an echo with an ejection fraction of 35%.  Right ventricular pressure was estimated be 54.  Coronary cath demonstrated nonobstructive coronary disease.  He had IV diuresis and his discharge weight was 163 pounds.  He was readmitted earlier this month with COPD exacerbation and some component of acute on chronic heart failure.  He was diuresed down to 151 pounds.  He has been seen in the [facility] clinic.  Marcus Tran was increased most recently.  He did wear a monitor that I reviewed and it demonstrated predominantly sinus rhythm.  However, he had runs of ventricular tachycardia with the longest being 5 beats.  There were bradycardia arrhythmias with intermittent third-degree heart block.  There were runs of SVT.  There was probable idioventricular rhythm.    He lives with his daughter.  He denies any presyncope or syncope.  He is not describing tachypalpitations.  He sleeps in a regular bed.  He is not describing new PND or orthopnea.  He has chronic dyspnea on exertion.  He is probably having more shortness of breath and get himself a breathing treatment before coming here today.  The biggest issue was he is coughing.  He is having paroxysms of this with some productive thick sputum.  He cannot tell me whether this is new or has just been ongoing.  He is not having any weight gain or edema.   Past Medical History:  Diagnosis Date   CHF (congestive heart failure) (HCC)    Dyslipidemia    Hypertension     Past Surgical History:  Procedure Laterality Date   LEG  SURGERY     Right-sided as result of trauma   RIGHT/LEFT HEART CATH AND CORONARY ANGIOGRAPHY N/A 03/09/2022   Procedure: RIGHT/LEFT HEART CATH AND CORONARY ANGIOGRAPHY;  Surgeon: Runell Gess, MD;  Location: [facility] INVASIVE CV LAB;  Service: Cardiovascular;  Laterality: N/A;   Umbilicus surgery     As a child     Current Outpatient Medications  Medication Sig Dispense Refill   aspirin EC 81 MG tablet Take 1 tablet (81 mg total) by mouth daily. Swallow whole. 90 tablet 3   atorvastatin (LIPITOR) 80 MG tablet Take 1 tablet (80 mg total) by mouth every evening. 90 tablet 3   carvedilol (COREG) 3.125 MG tablet Take 1 tablet (3.125 mg total) by mouth 2 (two) times daily with a meal. 180 tablet 3   dapagliflozin propanediol (FARXIGA) 10 MG TABS tablet Take 1 tablet (10 mg total) by mouth daily. 30 tablet 5   furosemide (LASIX) 20 MG tablet Take 1 tablet (20 mg total) by mouth every other day. Take an extra tablet as needed for swelling or a weight gain of 3 pounds or more in 24hrs 45 tablet 1   isosorbide-hydrALAZINE (BIDIL) 20-37.5 MG tablet Take 1 tablet by mouth 3 (three) times daily. 90 tablet 3   nitroGLYCERIN (NITROSTAT) 0.4 MG SL tablet Place 1 tablet (0.4 mg total) under the tongue every 5 (five) minutes  as needed for chest pain. 25 tablet 12   potassium chloride (KLOR-CON M) 10 MEQ tablet Take 1 tablet (10 mEq total) by mouth every other day. 45 tablet 1   sacubitril-valsartan (ENTRESTO) 97-103 MG Take 1 tablet by mouth 2 (two) times daily. 180 tablet 3   albuterol (VENTOLIN HFA) 108 (90 Base) MCG/ACT inhaler Inhale 2 puffs into the lungs every 4 (four) hours as needed for wheezing or shortness of breath. 1 each 11   Budeson-Glycopyrrol-Formoterol (BREZTRI AEROSPHERE) 160-9-4.8 MCG/ACT AERO Inhale 2 puffs into the lungs as needed. (Patient not taking: Reported on 04/16/2022)     No current facility-administered medications for this visit.    Allergies:   Patient has no known allergies.     ROS:  Please see the history of present illness.   Otherwise, review of systems are positive for none.   All other systems are reviewed and negative.    PHYSICAL EXAM: VS:  BP (!) 135/92   Pulse 77   Ht 5\' 5"  (1.651 m)   Wt 155 lb 9.6 oz (70.6 kg)   SpO2 96%   BMI 25.89 kg/m  , BMI Body mass index is 25.89 kg/m. GENERAL:  No acute distress HEENT:  Pupils equal round and reactive, fundi not visualized, oral mucosa unremarkable NECK:  No jugular venous distention, waveform within normal limits, carotid upstroke brisk and symmetric, no bruits, no thyromegaly LYMPHATICS:  No cervical, inguinal adenopathy LUNGS:   Decreased breath sounds bilaterally, scattered coarse crackles BACK:  No CVA tenderness CHEST:  Unremarkable HEART:  PMI not displaced or sustained,S1 and S2 within normal limits, no S3, no S4, no clicks, no rubs, no murmurs ABD:  Flat, positive bowel sounds normal in frequency in pitch, no bruits, no rebound, no guarding, no midline pulsatile mass, no hepatomegaly, no splenomegaly EXT:  2 plus pulses throughout, no edema, no cyanosis no clubbing SKIN:  No rashes no nodules NEURO:  Cranial nerves II through XII grossly intact, motor grossly intact throughout PSYCH:  Cognitively intact, oriented to person place and time    EKG:  EKG is not ordered today. The ekg ordered 03/23/2022 demonstrates sinus rhythm, rate 82, left ventricular hypertrophy with repolarization changes, incomplete right bundle branch block   Recent Labs: 03/21/2022: Hemoglobin 16.2; Platelets 209 03/22/2022: Magnesium 2.1 03/23/2022: B Natriuretic Peptide 314.9; BUN 19; Creatinine, Ser 1.45; Potassium 4.2; Sodium 133    Lipid Panel No results found for: "CHOL", "TRIG", "HDL", "CHOLHDL", "VLDL", "LDLCALC", "LDLDIRECT"    Wt Readings from Last 3 Encounters:  04/16/22 155 lb 9.6 oz (70.6 kg)  04/06/22 158 lb 12.8 oz (72 kg)  03/24/22 165 lb (74.8 kg)      Other studies Reviewed: Additional studies/  records that were reviewed today include: Pulmonary records.  I spoke with pulmonologist..   (Greater than 40 minutes reviewing all data with greater than 50% face to face with the patient). Review of the above records demonstrates:  Please see elsewhere in the note.     ASSESSMENT AND PLAN:  HFrEF/NICM -today I am going to increase his Entresto again to 97/103 twice daily.   He appears to be euvolemic today.   I will check a basic metabolic profile.  I like to put him in for a basic metabolic profile again in 10 days.    Snores / Sleep disordered breathing : Once he is over his acute paroxysms of coughing I would like him to have an inpatient sleep study.  BRADYCARDIA. He had bradycardia  as above.  He is not having any symptoms related to this.  He may have untreated sleep apnea.  We are just starting his management for his reduced ejection fraction.  Therefore, in the absence of any critical indication I am going to defer device therapy but will likely reevaluate his rhythm once his meds were titrated and sleep apnea has been ruled out or treated.  CAD : He had nonobstructive disease.  No change in therapy.  HTN:  This is being managed in the context of treating his CHF  Tobacco use / Suspected COPD:  I spoke with his having paroxysms of coughing and not moving much air.  They graciously agreed to see him in the office today.  He is again encouraged to quit smoking.    DM2 :  A1c was 6.9.  He will continue [place].   Current medicines are reviewed at length with the patient today.  The patient does not have concerns regarding medicines.  The following changes have been made:  As above  Labs/ tests ordered today include:   Orders Placed This Encounter  Procedures   Basic Metabolic Panel (BMET)   Basic Metabolic Panel (BMET)   Split night study     Disposition:   FU with APP in 2 weeks.     Signed, Rollene Rotunda, MD  04/16/2022 2:01 PM    [facility]

## 2022-04-16 ENCOUNTER — Ambulatory Visit (INDEPENDENT_AMBULATORY_CARE_PROVIDER_SITE_OTHER): Payer: Medicare Other | Admitting: Pulmonary Disease

## 2022-04-16 ENCOUNTER — Encounter: Payer: Self-pay | Admitting: Cardiology

## 2022-04-16 ENCOUNTER — Encounter: Payer: Self-pay | Admitting: Pulmonary Disease

## 2022-04-16 ENCOUNTER — Ambulatory Visit (INDEPENDENT_AMBULATORY_CARE_PROVIDER_SITE_OTHER): Payer: Medicare Other | Admitting: Cardiology

## 2022-04-16 VITALS — BP 135/92 | HR 77 | Ht 65.0 in | Wt 155.6 lb

## 2022-04-16 VITALS — BP 140/88 | HR 78 | Temp 97.8°F | Ht 65.5 in | Wt 155.1 lb

## 2022-04-16 DIAGNOSIS — R062 Wheezing: Secondary | ICD-10-CM | POA: Diagnosis not present

## 2022-04-16 DIAGNOSIS — R0683 Snoring: Secondary | ICD-10-CM | POA: Diagnosis not present

## 2022-04-16 DIAGNOSIS — I10 Essential (primary) hypertension: Secondary | ICD-10-CM

## 2022-04-16 DIAGNOSIS — I428 Other cardiomyopathies: Secondary | ICD-10-CM | POA: Diagnosis not present

## 2022-04-16 DIAGNOSIS — I5020 Unspecified systolic (congestive) heart failure: Secondary | ICD-10-CM

## 2022-04-16 DIAGNOSIS — I442 Atrioventricular block, complete: Secondary | ICD-10-CM

## 2022-04-16 MED ORDER — AMOXICILLIN-POT CLAVULANATE 875-125 MG PO TABS: 1.0000 | ORAL_TABLET | Freq: Two times a day (BID) | ORAL | 0 refills | Status: DC

## 2022-04-16 MED ORDER — ENTRESTO 97-103 MG PO TABS: 1.0000 | ORAL_TABLET | Freq: Two times a day (BID) | ORAL | 3 refills | Status: DC

## 2022-04-16 MED ORDER — PREDNISONE 20 MG PO TABS: 40.0000 mg | ORAL_TABLET | Freq: Every day | ORAL | 0 refills | Status: AC

## 2022-04-16 NOTE — Patient Instructions (Signed)
Medication Instructions:  Your physician has recommended you make the following change in your medication:  START: Entresto 97-103 mg twice daily *If you need a refill on your cardiac medications before your next appointment, please call your pharmacy*   Lab Work: Your physician recommends that you return for lab work in:  TODAY: BMET In 10 days: BMET If you have labs (blood work) drawn today and your tests are completely normal, you will receive your results only by: [employer] (if you have MyChart) OR A paper copy in the mail If you have any lab test that is abnormal or we need to change your treatment, we will call you to review the results.   Testing/Procedures: None   Follow-Up: At [facility], you and your health needs are our priority.  As part of our continuing mission to provide you with exceptional heart care, we have created designated Provider Care Teams.  These Care Teams include your primary Cardiologist (physician) and Advanced Practice Providers (APPs -  Physician Assistants and Nurse Practitioners) who all work together to provide you with the care you need, when you need it.  We recommend signing up for the patient portal called "MyChart".  Sign up information is provided on this After Visit Summary.  MyChart is used to connect with patients for Virtual Visits (Telemedicine).  Patients are able to view lab/test results, encounter notes, upcoming appointments, etc.  Non-urgent messages can be sent to your provider as well.   To learn more about what you can do with MyChart, go to [email or web address].    Your next appointment:   2 week(s)  The format for your next appointment:   In Person  Provider:   Gillian Shields, NP    Other Instructions   Important Information About Sugar

## 2022-04-16 NOTE — Progress Notes (Signed)
H&V Care Navigation CSW Progress Note  Clinical Social Worker met with patient to provide assistance with immediate transportation. Pt had taken insurance ride benefit to the appointment today but is being referred to see [facility] to been seen for a treatment. He is in need of assistance to get there. Vouchers provided for pt to take [employer] taxi to [place] and to get home after appointment w/ Velora Heckler. I called [place] and provided them with [employer] number and instructions that pt is able to be picked up as a will call. Jasmine and Dr. Percival Spanish aware, I remain available.   Patient is participating in a Managed Medicaid Plan:  no, [place] Medicare and Medicaid [place] Access Dual Complete  SDOH Screenings   Alcohol Screen: Low Risk  (03/11/2022)   Alcohol Screen    Last Alcohol Screening Score (AUDIT): 0  Depression (PHQ2-9): Not on file  Financial Resource Strain: Low Risk  (03/11/2022)   Overall Financial Resource Strain (CARDIA)    Difficulty of Paying Living Expenses: Not very hard  Food Insecurity: No Food Insecurity (03/11/2022)   Hunger Vital Sign    Worried About Running Out of Food in the Last Year: Never true    Ran Out of Food in the Last Year: Never true  Housing: Low Risk  (03/11/2022)   Housing    Last Housing Risk Score: 0  Physical Activity: Not on file  Social Connections: Not on file  Stress: Not on file  Tobacco Use: Medium Risk (04/16/2022)   Patient History    Smoking Tobacco Use: Former    Smokeless Tobacco Use: Never    Passive Exposure: Not on file  Transportation Needs: No Transportation Needs (04/16/2022)   PRAPARE - Transportation    Lack of Transportation (Medical): No    Lack of Transportation (Non-Medical): No    Westley Hummer, MSW, [place]  [phone]- work cell phone (preferred) [phone]- desk phone

## 2022-04-16 NOTE — Patient Instructions (Addendum)
I sent prescription for pills to help with your breathing  Use prednisone 40 mg (2 tabs) daily for 5 days and Augmentin 1 tablet twice a day for 7 days.  Use your nebulizer 4 times a day for the next week  If cough and shortness of breath are not improving by the weekend please go to the emergency room for further evaluation.  Return to clinic in 4 weeks or sooner as needed with Dr. Judeth Horn with pulmonary function test at that time

## 2022-04-16 NOTE — Progress Notes (Signed)
@Patient  ID: , male    DOB: Jun 11, 1951, 71 y.o.   MRN: [id number]  Chief Complaint  Patient presents with   Acute Visit    Seen by Dr. [phone] today.  Patient stated he gets very [facility] when walking.  Wheezing and coughing with green sputum.     Referring provider: [facility], NP  HPI:   71 y.o. whom are seeing for acute visit of cough and DOE at request of cardiologist.  Contacted by cardiology.  At the office for routine cardiology follow-up.  Noted to have increased dyspnea and cough for the last week.  Coughing a lot in the office.  Bringing up green sputum.  Brought him to clinic.  Wheezing on exam.  O2 sat low 90s on room air.  DuoNeb administered.  Wheeze, symptoms, O2 saturation improved after treatment.  HPI at initial visit: Overall, patient doing well.  Denies significant dyspnea.  He was admitted in the hospital for several weeks with acute hypoxemic respiratory failure.  Gradually improved with aggressive diuresis.  Was weaned off oxygen.  Also treated for possible COPD exacerbation.  No PFTs to formally diagnose.  Discharged on triple inhaled maintenance therapy.  He reports good adherence to this via Breztri.  He brings 61 to the office today.  Some confusion about rescue inhaler.  Seems like he is out of albuterol.  This was refilled.  He reports good adherence to diuretics.  Weight is stable.  Not going up.  Quit smoking.  He was congratulated on this.  About 25-pack-year history.  Review chest x-ray 03/06/2022 which shows interlobular septal thickening in concerning for pulmonary edema.  CTA PE protocol same day reviewed which reveals no pleural effusions, mild interlobular septal thickening at the bases and dependent areas, emphysema in the upper lobes.  8.6 mm right lower lobe nodule.  Subsequent chest x-ray 03/20/2022 with similar but improved interstitial prominence on my review and interpretation.  CTA PE protocol same day shows resolution of nodule seen  previously with improved lower lobe findings, emphysematous changes persist.  TTE 02/2022 reveals EF 35%, LVEDP at 22 at that time with elevated mean PA pressure 37 on right heart cath, PVR 3.9 Woods units on my calculation.   Questionaires / Pulmonary Flowsheets:   ACT:      No data to display          MMRC:     No data to display          Epworth:      No data to display          Tests:   FENO:  No results found for: "NITRICOXIDE"  PFT:     No data to display          WALK:      No data to display          Imaging: Personally reviewed LONG TERM MONITOR (3-14 DAYS)  Result Date: 04/14/2022 Normal sinus rhythm is the predominant rhythm.  There were runs of ventricular tachycardia with the longest being 5 beats.  There was significant bradycardia arrhythmias mostly during sleeping hours. There were episodes of third-degree heart block noted. There were runs of an idioventricular rhythm. No runs of supraventricular tachycardia.  CT Angio Chest PE W/Cm &/Or Wo Cm  Result Date: 03/20/2022 CLINICAL DATA:  Pulmonary embolism (PE) suspected, neg D-dimer Shortness of breath and cough.  Hypoxia. EXAM: CT ANGIOGRAPHY CHEST WITH CONTRAST TECHNIQUE: Multidetector CT imaging of the chest was  performed using the standard protocol during bolus administration of intravenous contrast. Multiplanar CT image reconstructions and MIPs were obtained to evaluate the vascular anatomy. RADIATION DOSE REDUCTION: This exam was performed according to the departmental dose-optimization program which includes automated exposure control, adjustment of the mA and/or kV according to patient size and/or use of iterative reconstruction technique. CONTRAST:  42mL OMNIPAQUE IOHEXOL 350 MG/ML SOLN COMPARISON:  Radiograph earlier today.  Chest CTA 2 weeks ago FINDINGS: Cardiovascular: There are no filling defects within the pulmonary arteries to suggest pulmonary embolus. Mild atherosclerosis of  the thoracic aorta. Mild multi chamber cardiomegaly. There is contrast refluxing into the hepatic veins and IVC. No pericardial effusion. Mediastinum/Nodes: Shotty mediastinal and hilar lymph nodes, all less than 15 mm short axis. Similar findings on prior exam. Tiny hiatal hernia. No suspicious thyroid nodule. Lungs/Pleura: Mild emphysema. There is moderate bronchial thickening, areas of mucoid impaction and mucous plugging within both lower lobes. The previous right lower lobe pulmonary nodule has resolved from prior exam consistent with infectious or inflammatory etiology. Improve subpleural interstitial thickening and diminished tiny subpleural nodules from prior. There is no acute or confluent airspace disease. No pleural effusion. Upper Abdomen: Contrast refluxes into the hepatic veins. Splenic flexure colonic diverticulosis incidentally noted. There is a small hiatal hernia. Musculoskeletal: Mild thoracic spondylosis with spurring. There are no acute or suspicious osseous abnormalities. Review of the MIP images confirms the above findings. IMPRESSION: 1. No pulmonary embolus. 2. Moderate bronchial thickening, areas of mucoid impaction and mucous plugging within both lower lobes, also seen on prior exam. 3. Mild multi chamber cardiomegaly with contrast refluxing into the hepatic veins and IVC consistent with elevated right heart pressures. 4. Previous right lower lobe pulmonary nodule has resolved from prior exam consistent with infectious or inflammatory etiology. Aortic Atherosclerosis (ICD10-I70.0) and Emphysema (ICD10-J43.9). Electronically Signed   By: Narda Rutherford M.D.   On: 03/20/2022 19:44   DG Chest Portable 1 View  Result Date: 03/20/2022 CLINICAL DATA:  cough EXAM: PORTABLE CHEST 1 VIEW COMPARISON:  Chest x-ray 03/06/2022, CT chest 03/06/2022 FINDINGS: Cardiomegaly.  The heart and mediastinal contours are unchanged. No focal consolidation. No pulmonary edema. No pleural effusion. No  pneumothorax. No acute osseous abnormality. IMPRESSION: Cardiomegaly with no radiographic findings to suggest acute cardiopulmonary abnormality. Electronically Signed   By: Tish Frederickson M.D.   On: 03/20/2022 17:35    Lab Results: Personally reviewed CBC    Component Value Date/Time   WBC 6.0 03/21/2022 0347   RBC 5.45 03/21/2022 0347   HGB 16.2 03/21/2022 0347   HCT 49.2 03/21/2022 0347   PLT 209 03/21/2022 0347   MCV 90.3 03/21/2022 0347   MCH 29.7 03/21/2022 0347   MCHC 32.9 03/21/2022 0347   RDW 15.2 03/21/2022 0347   LYMPHSABS 0.5 (L) 03/21/2022 0347   MONOABS 0.1 03/21/2022 0347   EOSABS 0.0 03/21/2022 0347   BASOSABS 0.0 03/21/2022 0347    BMET    Component Value Date/Time   NA 133 (L) 03/23/2022 1248   K 4.2 03/23/2022 1248   CL 101 03/23/2022 1248   CO2 23 03/23/2022 1248   GLUCOSE 157 (H) 03/23/2022 1248   BUN 19 03/23/2022 1248   CREATININE 1.45 (H) 03/23/2022 1248   CALCIUM 8.5 (L) 03/23/2022 1248   GFRNONAA 52 (L) 03/23/2022 1248    BNP    Component Value Date/Time   BNP 314.9 (H) 03/23/2022 1248    ProBNP No results found for: "PROBNP"  Specialty Problems  Pulmonary Problems   Acute respiratory failure with hypoxia and hypercapnia (HCC)   Bilateral hilar adenopathy syndrome   Acute respiratory failure with hypoxia (HCC)   COPD with acute exacerbation (HCC)   Snoring    No Known Allergies   There is no immunization history on file for this patient.  Past Medical History:  Diagnosis Date   CHF (congestive heart failure) (HCC)    Dyslipidemia    Hypertension     Tobacco History: Social History   Tobacco Use  Smoking Status Former   Packs/day: 0.50   Years: 50.00   Total pack years: 25.00   Types: Cigarettes   Quit date: 03/19/2022   Years since quitting: 0.0  Smokeless Tobacco Never  Tobacco Comments   Smoking cessation   Counseling given: Not Answered Tobacco comments: Smoking cessation   Continue to not  smoke  Outpatient Encounter Medications as of 04/16/2022  Medication Sig   albuterol (VENTOLIN HFA) 108 (90 Base) MCG/ACT inhaler Inhale 2 puffs into the lungs every 4 (four) hours as needed for wheezing or shortness of breath.   amoxicillin-clavulanate (AUGMENTIN) 875-125 MG tablet Take 1 tablet by mouth 2 (two) times daily.   aspirin EC 81 MG tablet Take 1 tablet (81 mg total) by mouth daily. Swallow whole.   atorvastatin (LIPITOR) 80 MG tablet Take 1 tablet (80 mg total) by mouth every evening.   Budeson-Glycopyrrol-Formoterol (BREZTRI AEROSPHERE) 160-9-4.8 MCG/ACT AERO Inhale 2 puffs into the lungs as needed.   carvedilol (COREG) 3.125 MG tablet Take 1 tablet (3.125 mg total) by mouth 2 (two) times daily with a meal.   dapagliflozin propanediol (FARXIGA) 10 MG TABS tablet Take 1 tablet (10 mg total) by mouth daily.   furosemide (LASIX) 20 MG tablet Take 1 tablet (20 mg total) by mouth every other day. Take an extra tablet as needed for swelling or a weight gain of 3 pounds or more in 24hrs   isosorbide-hydrALAZINE (BIDIL) 20-37.5 MG tablet Take 1 tablet by mouth 3 (three) times daily.   nitroGLYCERIN (NITROSTAT) 0.4 MG SL tablet Place 1 tablet (0.4 mg total) under the tongue every 5 (five) minutes as needed for chest pain.   potassium chloride (KLOR-CON M) 10 MEQ tablet Take 1 tablet (10 mEq total) by mouth every other day.   predniSONE (DELTASONE) 20 MG tablet Take 2 tablets (40 mg total) by mouth daily with breakfast for 5 days.   sacubitril-valsartan (ENTRESTO) 97-103 MG Take 1 tablet by mouth 2 (two) times daily.   [DISCONTINUED] potassium chloride (KLOR-CON) 10 MEQ tablet Take 10 mEq by mouth daily.   No facility-administered encounter medications on file as of 04/16/2022.     Review of Systems  Review of Systems  N/a Physical Exam  BP 140/88   Pulse 78   Temp 97.8 F (36.6 C) (Oral)   Ht 5' 5.5" (1.664 m)   Wt 155 lb 2 oz (70.4 kg)   SpO2 90%   BMI 25.42 kg/m   Wt  Readings from Last 5 Encounters:  04/16/22 155 lb 2 oz (70.4 kg)  04/16/22 155 lb 9.6 oz (70.6 kg)  04/06/22 158 lb 12.8 oz (72 kg)  03/24/22 165 lb (74.8 kg)  03/23/22 164 lb (74.4 kg)    BMI Readings from Last 5 Encounters:  04/16/22 25.42 kg/m  04/16/22 25.89 kg/m  04/06/22 26.43 kg/m  03/24/22 27.46 kg/m  03/23/22 27.29 kg/m     Physical Exam General: Well-appearing, no acute distress Eyes: EOMI, no icterus Neck:  Supple, no JVP appreciated Pulmonary: Air movement is fair at best, diffuse and expiratory wheezing throughout Cardiovascular: Regular rate and rhythm, no murmur, no lower extremity edema noted Abdomen: Nontender, bowel sounds present MSK: No synovitis, joint effusion Neuro: Normal gait, no weakness Psych: Normal mood, full affect   Assessment & Plan:   Dyspnea on exertion, worsening cough: Suspect exacerbation of underlying lung disease, smoking-related disease.  No PFTs to formally diagnose.  Wheezing on exam.  Prednisone 40 mg daily x5 days and Augmentin twice daily x7 days sent to local pharmacy.  RN spoke with pharmacy on phone, weakness conversation, stated will be able to deliver medicine today.  Stressed importance of starting medication this afternoon.  DuoNeb administered in clinic with mild improvement in symptoms, improve in wheeze, and improvement in O2 sat to mid 90s.  Recommend follow-up in 4 weeks with PFTs, likely benefit from controller medication to be discussed at that time.   Return in about 4 weeks (around 05/14/2022).   Karren Burly, MD 04/16/2022   This appointment required 40 minutes of patient care (this includes precharting, chart review, review of results, face-to-face care, etc.).

## 2022-04-17 LAB — BASIC METABOLIC PANEL
BUN/Creatinine Ratio: 6 — ABNORMAL LOW (ref 10–24)
BUN: 9 mg/dL (ref 8–27)
CO2: 24 mmol/L (ref 20–29)
Calcium: 9.2 mg/dL (ref 8.6–10.2)
Chloride: 96 mmol/L (ref 96–106)
Creatinine, Ser: 1.52 mg/dL — ABNORMAL HIGH (ref 0.76–1.27)
Glucose: 77 mg/dL (ref 70–99)
Potassium: 5.1 mmol/L (ref 3.5–5.2)
Sodium: 139 mmol/L (ref 134–144)
eGFR: 48 mL/min/{1.73_m2} — ABNORMAL LOW (ref >59)

## 2022-04-22 ENCOUNTER — Encounter (HOSPITAL_COMMUNITY): Payer: Self-pay | Admitting: Emergency Medicine

## 2022-04-22 ENCOUNTER — Emergency Department (HOSPITAL_COMMUNITY)
Admission: EM | Admit: 2022-04-22 | Discharge: 2022-04-23 | Disposition: A | Discharge status: Home / Self Care | Payer: Medicare Other | Attending: Emergency Medicine | Admitting: Emergency Medicine

## 2022-04-22 ENCOUNTER — Emergency Department (HOSPITAL_COMMUNITY): Payer: Medicare Other

## 2022-04-22 DIAGNOSIS — Y92410 Unspecified street and highway as the place of occurrence of the external cause: Secondary | ICD-10-CM | POA: Insufficient documentation

## 2022-04-22 DIAGNOSIS — M545 Low back pain, unspecified: Secondary | ICD-10-CM | POA: Insufficient documentation
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
## 2022-04-23 DIAGNOSIS — M545 Low back pain, unspecified: Secondary | ICD-10-CM | POA: Diagnosis not present

## 2022-04-23 MED ORDER — LIDOCAINE 5 % EX PTCH: 1.0000 | MEDICATED_PATCH | CUTANEOUS | 0 refills | Status: DC

## 2022-04-23 MED ORDER — LIDOCAINE 5 % EX PTCH
1.0000 | MEDICATED_PATCH | CUTANEOUS | Status: DC
  Administered 2022-04-23: 1 via TRANSDERMAL
  Filled 2022-04-23: qty 1

## 2022-04-23 MED ORDER — METHOCARBAMOL 500 MG PO TABS: 500.0000 mg | ORAL_TABLET | Freq: Two times a day (BID) | ORAL | 0 refills | Status: DC
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
## 2022-04-23 NOTE — ED Notes (Signed)
Patient verbalizes understanding of d/c instructions. Opportunities for questions and answers were provided. Pt d/c from ED and ambulated to lobby.  

## 2022-04-30 ENCOUNTER — Ambulatory Visit (HOSPITAL_BASED_OUTPATIENT_CLINIC_OR_DEPARTMENT_OTHER): Payer: Medicare Other | Admitting: Family
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
## 2022-05-04 ENCOUNTER — Other Ambulatory Visit: Payer: Self-pay

## 2022-05-04 ENCOUNTER — Encounter (HOSPITAL_COMMUNITY): Payer: Self-pay

## 2022-05-04 ENCOUNTER — Inpatient Hospital Stay (HOSPITAL_COMMUNITY)
Admission: EM | Admit: 2022-05-04 | Discharge: 2022-05-07 | DRG: 291 | Disposition: A | Discharge status: Home / Self Care | Payer: Medicare Other | Attending: Internal Medicine | Admitting: Internal Medicine

## 2022-05-04 ENCOUNTER — Emergency Department (HOSPITAL_COMMUNITY): Payer: Medicare Other

## 2022-05-04 DIAGNOSIS — I471 Supraventricular tachycardia: Secondary | ICD-10-CM | POA: Diagnosis present

## 2022-05-04 DIAGNOSIS — R0603 Acute respiratory distress: Principal | ICD-10-CM

## 2022-05-04 DIAGNOSIS — J441 Chronic obstructive pulmonary disease with (acute) exacerbation: Secondary | ICD-10-CM | POA: Diagnosis present

## 2022-05-04 DIAGNOSIS — I5043 Acute on chronic combined systolic (congestive) and diastolic (congestive) heart failure: Secondary | ICD-10-CM | POA: Diagnosis not present

## 2022-05-04 DIAGNOSIS — I10 Essential (primary) hypertension: Secondary | ICD-10-CM | POA: Diagnosis present

## 2022-05-04 DIAGNOSIS — J9601 Acute respiratory failure with hypoxia: Secondary | ICD-10-CM | POA: Diagnosis not present

## 2022-05-04 DIAGNOSIS — I472 Ventricular tachycardia, unspecified: Secondary | ICD-10-CM | POA: Diagnosis present

## 2022-05-04 DIAGNOSIS — N1831 Chronic kidney disease, stage 3a: Secondary | ICD-10-CM | POA: Diagnosis present

## 2022-05-04 DIAGNOSIS — F17210 Nicotine dependence, cigarettes, uncomplicated: Secondary | ICD-10-CM | POA: Diagnosis present

## 2022-05-04 DIAGNOSIS — I5042 Chronic combined systolic (congestive) and diastolic (congestive) heart failure: Secondary | ICD-10-CM | POA: Diagnosis present

## 2022-05-04 DIAGNOSIS — I2729 Other secondary pulmonary hypertension: Secondary | ICD-10-CM | POA: Diagnosis present

## 2022-05-04 DIAGNOSIS — I2510 Atherosclerotic heart disease of native coronary artery without angina pectoris: Secondary | ICD-10-CM | POA: Diagnosis not present

## 2022-05-04 DIAGNOSIS — I130 Hypertensive heart and chronic kidney disease with heart failure and stage 1 through stage 4 chronic kidney disease, or unspecified chronic kidney disease: Secondary | ICD-10-CM | POA: Diagnosis not present

## 2022-05-04 DIAGNOSIS — E876 Hypokalemia: Secondary | ICD-10-CM

## 2022-05-04 DIAGNOSIS — I248 Other forms of acute ischemic heart disease: Secondary | ICD-10-CM | POA: Diagnosis present

## 2022-05-04 DIAGNOSIS — I428 Other cardiomyopathies: Secondary | ICD-10-CM | POA: Diagnosis present

## 2022-05-04 DIAGNOSIS — I5020 Unspecified systolic (congestive) heart failure: Secondary | ICD-10-CM

## 2022-05-04 DIAGNOSIS — E1122 Type 2 diabetes mellitus with diabetic chronic kidney disease: Secondary | ICD-10-CM | POA: Diagnosis present

## 2022-05-04 DIAGNOSIS — Z1152 Encounter for screening for COVID-19: Secondary | ICD-10-CM

## 2022-05-04 DIAGNOSIS — E785 Hyperlipidemia, unspecified: Secondary | ICD-10-CM | POA: Diagnosis present

## 2022-05-04 DIAGNOSIS — R053 Chronic cough: Secondary | ICD-10-CM | POA: Diagnosis present

## 2022-05-04 DIAGNOSIS — Z7982 Long term (current) use of aspirin: Secondary | ICD-10-CM

## 2022-05-04 DIAGNOSIS — Z79899 Other long term (current) drug therapy: Secondary | ICD-10-CM

## 2022-05-04 DIAGNOSIS — I442 Atrioventricular block, complete: Secondary | ICD-10-CM | POA: Diagnosis present

## 2022-05-04 LAB — I-STAT VENOUS BLOOD GAS, ED
Acid-Base Excess: 3 mmol/L — ABNORMAL HIGH (ref 0.0–2.0)
Bicarbonate: 30.4 mmol/L — ABNORMAL HIGH (ref 20.0–28.0)
Calcium, Ion: 1.12 mmol/L — ABNORMAL LOW (ref 1.15–1.40)
HCT: 52 % (ref 39.0–52.0)
Hemoglobin: 17.7 g/dL — ABNORMAL HIGH (ref 13.0–17.0)
O2 Saturation: 99 %
Potassium: 4 mmol/L (ref 3.5–5.1)
Sodium: 138 mmol/L (ref 135–145)
TCO2: 32 mmol/L (ref 22–32)
pCO2, Ven: 57.2 mmHg (ref 44–60)
pH, Ven: 7.333 (ref 7.25–7.43)
pO2, Ven: 170 mmHg — ABNORMAL HIGH (ref 32–45)

## 2022-05-04 LAB — CBC WITH DIFFERENTIAL/PLATELET
Abs Immature Granulocytes: 0.01 10*3/uL (ref 0.00–0.07)
Basophils Absolute: 0.1 10*3/uL (ref 0.0–0.1)
Basophils Relative: 1 %
Eosinophils Absolute: 0.6 10*3/uL — ABNORMAL HIGH (ref 0.0–0.5)
Eosinophils Relative: 12 %
HCT: 51.5 % (ref 39.0–52.0)
Hemoglobin: 16.5 g/dL (ref 13.0–17.0)
Immature Granulocytes: 0 %
Lymphocytes Relative: 28 %
Lymphs Abs: 1.4 10*3/uL (ref 0.7–4.0)
MCH: 29.3 pg (ref 26.0–34.0)
MCHC: 32 g/dL (ref 30.0–36.0)
MCV: 91.5 fL (ref 80.0–100.0)
Monocytes Absolute: 0.5 10*3/uL (ref 0.1–1.0)
Monocytes Relative: 10 %
Neutro Abs: 2.4 10*3/uL (ref 1.7–7.7)
Neutrophils Relative %: 49 %
Platelets: 174 10*3/uL (ref 150–400)
RBC: 5.63 MIL/uL (ref 4.22–5.81)
RDW: 14.2 % (ref 11.5–15.5)
WBC: 4.9 10*3/uL (ref 4.0–10.5)
nRBC: 0 % (ref 0.0–0.2)

## 2022-05-04 LAB — TROPONIN I (HIGH SENSITIVITY): Troponin I (High Sensitivity): 30 ng/L — ABNORMAL HIGH (ref <18)

## 2022-05-04 LAB — COMPREHENSIVE METABOLIC PANEL
ALT: 38 U/L (ref 0–44)
AST: 34 U/L (ref 15–41)
Albumin: 4 g/dL (ref 3.5–5.0)
Alkaline Phosphatase: 63 U/L (ref 38–126)
Anion gap: 9 (ref 5–15)
BUN: 18 mg/dL (ref 8–23)
CO2: 27 mmol/L (ref 22–32)
Calcium: 9.1 mg/dL (ref 8.9–10.3)
Chloride: 103 mmol/L (ref 98–111)
Creatinine, Ser: 1.41 mg/dL — ABNORMAL HIGH (ref 0.61–1.24)
GFR, Estimated: 53 mL/min — ABNORMAL LOW (ref >60)
Glucose, Bld: 119 mg/dL — ABNORMAL HIGH (ref 70–99)
Potassium: 4.2 mmol/L (ref 3.5–5.1)
Sodium: 139 mmol/L (ref 135–145)
Total Bilirubin: 1.2 mg/dL (ref 0.3–1.2)
Total Protein: 6.8 g/dL (ref 6.5–8.1)

## 2022-05-04 LAB — RESP PANEL BY RT-PCR (FLU A&B, COVID) ARPGX2
Influenza A by PCR: NEGATIVE
Influenza B by PCR: NEGATIVE
SARS Coronavirus 2 by RT PCR: NEGATIVE

## 2022-05-04 LAB — MAGNESIUM: Magnesium: 2.2 mg/dL (ref 1.7–2.4)

## 2022-05-04 LAB — BRAIN NATRIURETIC PEPTIDE: B Natriuretic Peptide: 247.8 pg/mL — ABNORMAL HIGH (ref 0.0–100.0)

## 2022-05-04 MED ORDER — ACETAMINOPHEN 325 MG PO TABS: 650.0000 mg | ORAL_TABLET | Freq: Four times a day (QID) | ORAL | Status: DC | PRN

## 2022-05-04 MED ORDER — ALBUTEROL SULFATE (2.5 MG/3ML) 0.083% IN NEBU: 2.5000 mg | INHALATION_SOLUTION | RESPIRATORY_TRACT | Status: DC | PRN

## 2022-05-04 MED ORDER — NITROGLYCERIN IN D5W 200-5 MCG/ML-% IV SOLN
0.0000 ug/min | INTRAVENOUS | Status: DC
  Administered 2022-05-04: 100 ug/min via INTRAVENOUS
  Filled 2022-05-04: qty 250

## 2022-05-04 MED ORDER — DAPAGLIFLOZIN PROPANEDIOL 10 MG PO TABS
10.0000 mg | ORAL_TABLET | Freq: Every day | ORAL | Status: DC
  Administered 2022-05-05 – 2022-05-07 (×3): 10 mg via ORAL
  Filled 2022-05-04 (×3): qty 1

## 2022-05-04 MED ORDER — SACUBITRIL-VALSARTAN 97-103 MG PO TABS
1.0000 | ORAL_TABLET | Freq: Two times a day (BID) | ORAL | Status: DC
  Filled 2022-05-04: qty 1

## 2022-05-04 MED ORDER — ACETAMINOPHEN 650 MG RE SUPP: 650.0000 mg | Freq: Four times a day (QID) | RECTAL | Status: DC | PRN

## 2022-05-04 MED ORDER — POLYETHYLENE GLYCOL 3350 17 G PO PACK
17.0000 g | PACK | Freq: Every day | ORAL | Status: DC | PRN
Start: 2022-05-04 — End: 2022-05-07

## 2022-05-04 MED ORDER — FUROSEMIDE 10 MG/ML IJ SOLN
40.0000 mg | Freq: Once | INTRAMUSCULAR | Status: AC
Start: 2022-05-04 — End: 2022-05-04
  Administered 2022-05-04: 40 mg via INTRAVENOUS
  Filled 2022-05-04: qty 4

## 2022-05-04 MED ORDER — FUROSEMIDE 10 MG/ML IJ SOLN: 40.0000 mg | Freq: Every day | INTRAMUSCULAR | Status: DC

## 2022-05-04 MED ORDER — ENOXAPARIN SODIUM 40 MG/0.4ML IJ SOSY
40.0000 mg | PREFILLED_SYRINGE | INTRAMUSCULAR | Status: DC
  Administered 2022-05-05 – 2022-05-07 (×3): 40 mg via SUBCUTANEOUS
  Filled 2022-05-04 (×3): qty 0.4

## 2022-05-04 MED ORDER — IPRATROPIUM BROMIDE 0.02 % IN SOLN: 0.5000 mg | Freq: Four times a day (QID) | RESPIRATORY_TRACT | Status: DC

## 2022-05-04 MED ORDER — CARVEDILOL 3.125 MG PO TABS
3.1250 mg | ORAL_TABLET | Freq: Two times a day (BID) | ORAL | Status: DC
Start: 2022-05-05 — End: 2022-05-05

## 2022-05-04 MED ORDER — ATORVASTATIN CALCIUM 80 MG PO TABS
80.0000 mg | ORAL_TABLET | Freq: Every evening | ORAL | Status: DC
  Administered 2022-05-05 – 2022-05-06 (×2): 80 mg via ORAL
  Filled 2022-05-04 (×2): qty 1

## 2022-05-04 MED ORDER — ASPIRIN 81 MG PO TBEC
81.0000 mg | DELAYED_RELEASE_TABLET | Freq: Every day | ORAL | Status: DC
  Administered 2022-05-05 – 2022-05-07 (×3): 81 mg via ORAL
  Filled 2022-05-04 (×3): qty 1

## 2022-05-04 MED ORDER — ALBUTEROL SULFATE (2.5 MG/3ML) 0.083% IN NEBU
5.0000 mg | INHALATION_SOLUTION | RESPIRATORY_TRACT | Status: DC
  Administered 2022-05-05: 5 mg via RESPIRATORY_TRACT
  Filled 2022-05-04: qty 6

## 2022-05-04 MED ORDER — ALBUTEROL (5 MG/ML) CONTINUOUS INHALATION SOLN
10.0000 mg/h | INHALATION_SOLUTION | Freq: Once | RESPIRATORY_TRACT | Status: AC
  Administered 2022-05-04: 10 mg/h via RESPIRATORY_TRACT
  Filled 2022-05-04: qty 0.5

## 2022-05-04 MED ORDER — PREDNISONE 20 MG PO TABS: 40.0000 mg | ORAL_TABLET | Freq: Every day | ORAL | Status: DC

## 2022-05-04 MED ORDER — ISOSORB DINITRATE-HYDRALAZINE 20-37.5 MG PO TABS
1.0000 | ORAL_TABLET | Freq: Three times a day (TID) | ORAL | Status: DC
  Administered 2022-05-05: 1 via ORAL
  Filled 2022-05-04 (×3): qty 1

## 2022-05-04 MED ORDER — IPRATROPIUM BROMIDE 0.02 % IN SOLN
0.5000 mg | Freq: Once | RESPIRATORY_TRACT | Status: AC
  Administered 2022-05-04: 0.5 mg via RESPIRATORY_TRACT
  Filled 2022-05-04: qty 2.5

## 2022-05-04 MED ORDER — METHYLPREDNISOLONE SODIUM SUCC 125 MG IJ SOLR
125.0000 mg | Freq: Once | INTRAMUSCULAR | Status: AC
Start: 2022-05-04 — End: 2022-05-04
  Administered 2022-05-04: 125 mg via INTRAVENOUS
  Filled 2022-05-04: qty 2

## 2022-05-04 NOTE — ED Triage Notes (Signed)
Patient arrives to ED via ems on CPAP, patient has been having SOB all day and using multiple nebs at home with no relief. Patient is talking in one word sentences. Patient denies any pain.

## 2022-05-04 NOTE — H&P (Signed)
History and Physical   Marcus Tran:[id number] DOB: 1950-11-04 DOA: 05/04/2022  PCP: Hillery Aldo, NP   Patient coming from: Home  Chief Complaint: Shortness of breath  HPI: Marcus Tran is a 71 y.o. male with medical history significant of bilateral hilar adenopathy, hypertension, CKD 3A, CAD, systolic and diastolic heart failure, third-degree heart block, hyperlipidemia, elevated A1c, chronic lung disease without diagnosis presenting with worsening shortness of breath.  Patient reports 3 days of gradually worsening shortness of breath with acute worsening today.  Has tried multiple nebulizer treatments at home without improvement.  Called EMS and was placed on CPAP and received DuoNeb in route.  He denies fevers, chills, chest pain, abdominal pain, constipation, diarrhea, nausea, vomiting.***  ED Course: Vital signs in the ED significant for heart rate in the 90s to 110s, respiratory rate in the 20s, blood pressure initially in the 210s systolic and patient placed on a nitro drip, respiratory distress/hypoxic respiratory failure placed on BiPAP.  Lab work-up included BMP which showed creatinine stable at 1.41, glucose 119.  CBC within normal limits.  Magnesium normal.  BNP elevated to 47.  Troponin near baseline at 30.  Respiratory panel for flu and COVID pending.  VBG with normal pH and PCO2.  Chest x-ray showed no acute abnormality.  Patient received Lasix, Solu-Medrol, Atrovent, albuterol, nitro drip in the ED.  Review of Systems: As per HPI otherwise all other systems reviewed and are negative.  Past Medical History:  Diagnosis Date   Acute metabolic encephalopathy 03/08/2022   Acute respiratory failure with hypoxia (HCC) 03/20/2022   Acute respiratory failure with hypoxia and hypercapnia (HCC) 03/06/2022   CHF (congestive heart failure) (HCC)    Dyslipidemia    Hypertension     Past Surgical History:  Procedure Laterality Date   LEG SURGERY     Right-sided as result of trauma    RIGHT/LEFT HEART CATH AND CORONARY ANGIOGRAPHY N/A 03/09/2022   Procedure: RIGHT/LEFT HEART CATH AND CORONARY ANGIOGRAPHY;  Surgeon: Runell Gess, MD;  Location: [facility] INVASIVE CV LAB;  Service: Cardiovascular;  Laterality: N/A;   Umbilicus surgery     As a child    Social History  reports that he quit smoking about 6 weeks ago. His smoking use included cigarettes. He has a 25.00 pack-year smoking history. He has never used smokeless tobacco. He reports current drug use. Drug: Marijuana. No history on file for alcohol use.  No Known Allergies  History reviewed. No pertinent family history. Reviewed on admission***  Prior to Admission medications   Medication Sig Start Date End Date Taking? Authorizing Provider  albuterol (VENTOLIN HFA) 108 (90 Base) MCG/ACT inhaler Inhale 2 puffs into the lungs every 4 (four) hours as needed for wheezing or shortness of breath. 04/06/22   Hunsucker, Lesia Sago, MD  aspirin EC 81 MG tablet Take 1 tablet (81 mg total) by mouth daily. Swallow whole. 03/24/22   Alver Sorrow, NP  atorvastatin (LIPITOR) 80 MG tablet Take 1 tablet (80 mg total) by mouth every evening. 03/24/22   Alver Sorrow, NP  Budeson-Glycopyrrol-Formoterol (BREZTRI AEROSPHERE) 160-9-4.8 MCG/ACT AERO Inhale 2 puffs into the lungs as needed.    [provider]  carvedilol (COREG) 3.125 MG tablet Take 1 tablet (3.125 mg total) by mouth 2 (two) times daily with a meal. 03/24/22 03/19/23  Alver Sorrow, NP  dapagliflozin propanediol (FARXIGA) 10 MG TABS tablet Take 1 tablet (10 mg total) by mouth daily. 03/24/22   Alver Sorrow,  NP  furosemide (LASIX) 20 MG tablet Take 1 tablet (20 mg total) by mouth every other day. Take an extra tablet as needed for swelling or a weight gain of 3 pounds or more in 24hrs 03/24/22   Walker, Martie Lee, NP  isosorbide-hydrALAZINE (BIDIL) 20-37.5 MG tablet Take 1 tablet by mouth 3 (three) times daily. 03/24/22   Loel Dubonnet, NP  lidocaine  (LIDODERM) 5 % Place 1 patch onto the skin daily. Remove & Discard patch within 12 hours or as directed by MD 04/23/22   Montine Circle, PA-C  methocarbamol (ROBAXIN) 500 MG tablet Take 1 tablet (500 mg total) by mouth 2 (two) times daily. 04/23/22   Montine Circle, PA-C  nitroGLYCERIN (NITROSTAT) 0.4 MG SL tablet Place 1 tablet (0.4 mg total) under the tongue every 5 (five) minutes as needed for chest pain. 03/11/22   Shawna Clamp, MD  potassium chloride (KLOR-CON M) 10 MEQ tablet Take 1 tablet (10 mEq total) by mouth every other day. 03/24/22   Loel Dubonnet, NP  sacubitril-valsartan (ENTRESTO) 97-103 MG Take 1 tablet by mouth 2 (two) times daily. 04/16/22   Minus Breeding, MD  potassium chloride (KLOR-CON) 10 MEQ tablet Take 10 mEq by mouth daily.  03/11/22  [provider]    Physical Exam: Vitals:   05/04/22 2255 05/04/22 2258 05/04/22 2305 05/04/22 2307  BP:   (!) 162/136 (!) 175/107  Pulse:   (!) 114 (!) 110  Resp:   20 (!) 25  Temp:      TempSrc:      SpO2: 97%  97% 97%  Weight:  70 kg    Height:  5\' 5"  (1.651 m)      Physical Exam Constitutional:      General: He is not in acute distress.    Appearance: Normal appearance.  HENT:     Head: Normocephalic and atraumatic.     Mouth/Throat:     Mouth: Mucous membranes are moist.     Pharynx: Oropharynx is clear.  Eyes:     Extraocular Movements: Extraocular movements intact.     Pupils: Pupils are equal, round, and reactive to light.  Cardiovascular:     Rate and Rhythm: Normal rate and regular rhythm.     Pulses: Normal pulses.     Heart sounds: Normal heart sounds.  Pulmonary:     Effort: Pulmonary effort is normal. No respiratory distress.     Breath sounds: Wheezing and rales present.  Abdominal:     General: Bowel sounds are normal. There is no distension.     Palpations: Abdomen is soft.     Tenderness: There is no abdominal tenderness.  Musculoskeletal:        General: No swelling or deformity.   Skin:    General: Skin is warm and dry.  Neurological:     General: No focal deficit present.     Mental Status: Mental status is at baseline.   ***  Labs on Admission: I have personally reviewed following labs and imaging studies  CBC: Recent Labs  Lab 05/04/22 2151 05/04/22 2219  WBC 4.9  --   NEUTROABS 2.4  --   HGB 16.5 17.7*  HCT 51.5 52.0  MCV 91.5  --   PLT 174  --     Basic Metabolic Panel: Recent Labs  Lab 05/04/22 2151 05/04/22 2219  NA 139 138  K 4.2 4.0  CL 103  --   CO2 27  --   GLUCOSE 119*  --  BUN 18  --   CREATININE 1.41*  --   CALCIUM 9.1  --   MG 2.2  --     GFR: Estimated Creatinine Clearance: 41.2 mL/min (A) (by C-G formula based on SCr of 1.41 mg/dL (H)).  Liver Function Tests: Recent Labs  Lab 05/04/22 2151  AST 34  ALT 38  ALKPHOS 63  BILITOT 1.2  PROT 6.8  ALBUMIN 4.0    Urine analysis:    Component Value Date/Time   COLORURINE STRAW (A) 03/21/2022 1545   APPEARANCEUR CLEAR 03/21/2022 1545   LABSPEC 1.005 03/21/2022 1545   PHURINE 5.0 03/21/2022 1545   GLUCOSEU >=500 (A) 03/21/2022 1545   HGBUR NEGATIVE 03/21/2022 1545   BILIRUBINUR NEGATIVE 03/21/2022 1545   KETONESUR NEGATIVE 03/21/2022 1545   PROTEINUR NEGATIVE 03/21/2022 1545   NITRITE NEGATIVE 03/21/2022 1545   LEUKOCYTESUR NEGATIVE 03/21/2022 1545    Radiological Exams on Admission: DG Chest Port 1 View  Result Date: 05/04/2022 CLINICAL DATA:  Dyspnea. EXAM: PORTABLE CHEST 1 VIEW COMPARISON:  Chest radiograph dated 04/22/2022. FINDINGS: Mild chronic interstitial coarsening. No focal consolidation, pleural effusion or pneumothorax. The left costophrenic angle has been excluded from the image. The cardiac silhouette is within limits. Prominence of the central pulmonary arteries bilaterally suggestive of pulmonary hypertension. No acute osseous pathology. IMPRESSION: No active disease. Electronically Signed   By: Elgie Collard M.D.   On: 05/04/2022 22:18     EKG: Independently reviewed.  Sinus tachycardia 109 bpm.  Right bundle branch block.  Nonspecific T wave changes with apparent depression in lateral leads.  Assessment/Plan Active Problems:   HTN (hypertension)   Stage 3a chronic kidney disease (CKD) (HCC)   Coronary artery disease   Acute on chronic combined systolic and diastolic CHF (congestive heart failure) (HCC)   COPD with acute exacerbation (HCC)   Third degree heart block (HCC)   Acute respiratory failure with hypoxia (HCC)   Acute respiratory failure with hypoxia Acute on chronic combined systolic and diastolic heart failure COPD exacerbation > Patient with history of CHF last echo in May with EF 35%, grade 2 diastolic function, normal RV function. > Suspected chronic obstructive lung disease though no LFTs yet he was having a flareup when seen by pulmonology recently, is prescribed Breztri and albuterol. > Presenting with progressive shortness of breath last 3 days with acute worsening today found to be in respiratory distress in the ED despite DuoNebs and see Pap in route.  Was placed on BiPAP and started on a nitro drip given blood pressure in the 210s systolic and some concern for developing flash pulmonary edema. > Noted to be wheezing on exam and received Solu-Medrol, Atrovent, albuterol in the ED.  Also received a dose of Lasix as BNP elevated to 247. > Chest x-ray showed no acute disease.  Troponin 30 on first check, will repeat. > Suspect this is multifactorial respiratory failure from heart failure and obstructive lung disease exacerbation.  We will treat for both and check RVP to evaluate for possible trigger of his obstructive lung disease exacerbation. -Monitor on progressive unit -Continue BiPAP, wean as tolerated For obstructive lung disease: - Continue daily prednisone - Scheduled Atrovent - Albuterol - Check RVP, follow-up flu and COVID screen -His Prestridge is currently prescribed as needed, this should  likely be made to maintenance For CHF exacerbation: - Continue with daily Lasix IV - Strict I's and O's, daily weights - Trend renal function and electrolytes, magnesium normal in ED - Hold off on echo  as one was recently done in May - Continue home carvedilol, Entresto, BiDil, Farxiga  Hypertension > Noted to have hypertensive urgency/emergency in the ED with initial blood pressure in the [id number] to [id number] systolic over [id number] diastolic. > Started on nitro drip given this and respiratory failure concern for possible early flash pulmonary edema at that time. > Chest x-ray showed no acute disease.  Has remained on nitro drip with blood pressure in the 130s to 170s. - Monitor on progressive as above - Wean off nitro drip - Continue home carvedilol, BiDil, Entresto - On IV Lasix as above  CKD 3A > Creatinine stable at 1.41 in ED - Trend renal function electrolytes  CAD Hyperlipidemia - Continue home aspirin, atorvastatin, carvedilol, BiDil   Diabetes > No current diagnosis of this in chart, last A1c 6.9 and is on Farxiga for comorbid CHF. - Continue home Farxiga  History of third-degree heart block - Noted  DVT prophylaxis: Lovenox Code Status:   Full Family Communication:  ***  Disposition Plan:   Patient is from:  Home  Anticipated DC to:  Home  Anticipated DC date:  1 to 3 days  Anticipated DC barriers: None  Consults called:  None Admission status:  Observation, progressive  Severity of Illness: The appropriate patient status for this patient is OBSERVATION. Observation status is judged to be reasonable and necessary in order to provide the required intensity of service to ensure the patient's safety. The patient's presenting symptoms, physical exam findings, and initial radiographic and laboratory data in the context of their medical condition is felt to place them at decreased risk for further clinical deterioration. Furthermore, it is anticipated that the patient will be  medically stable for discharge from the hospital within 2 midnights of admission.    Marcelyn Bruins MD [facility]  How to contact the [facility] Attending or Consulting provider [place] or covering provider during after hours [place], for this patient?   Check the care team in [facility] and look for a) attending/consulting [facility] provider listed and b) the [facility] team listed Log into [email or web address] and use [place]'s universal password to access. If you do not have the password, please contact the hospital operator. Locate the [facility] provider you are looking for under [facility] and page to a number that you can be directly reached. If you still have difficulty reaching the provider, please page the [facility] (Director on Call) for the Hospitalists listed on amion for assistance.  05/04/2022, 11:19 PM

## 2022-05-04 NOTE — ED Provider Notes (Signed)
[facility] EMERGENCY DEPARTMENT Provider Note   CSN: [id number] Arrival date & time: 05/04/22  2136     History {Add pertinent medical, surgical, social history, OB history to HPI:1} No chief complaint on file.   Marcus Tran is a 71 y.o. male.  HPI Patient presents for respiratory distress.  Medical history includes HTN, CKD, CAD, CHF, COPD, and HLD.  He states that he has been feeling progressively short of breath over the past several days.  Today, he had an acute worsening.  EMS was called to his home.  Prior to the arrival, patient received a DuoNeb.  On arrival, patient was in respiratory distress.  Blood pressures were elevated in the 210s over 120s range.  Patient was given 1 SL NTG.  He was placed on CPAP.    Home Medications Prior to Admission medications   Medication Sig Start Date End Date Taking? Authorizing Provider  albuterol (VENTOLIN HFA) 108 (90 Base) MCG/ACT inhaler Inhale 2 puffs into the lungs every 4 (four) hours as needed for wheezing or shortness of breath. 04/06/22   Hunsucker, Lesia Sago, MD  amoxicillin-clavulanate (AUGMENTIN) 875-125 MG tablet Take 1 tablet by mouth 2 (two) times daily. 04/16/22   Hunsucker, Lesia Sago, MD  aspirin EC 81 MG tablet Take 1 tablet (81 mg total) by mouth daily. Swallow whole. 03/24/22   Alver Sorrow, NP  atorvastatin (LIPITOR) 80 MG tablet Take 1 tablet (80 mg total) by mouth every evening. 03/24/22   Alver Sorrow, NP  Budeson-Glycopyrrol-Formoterol (BREZTRI AEROSPHERE) 160-9-4.8 MCG/ACT AERO Inhale 2 puffs into the lungs as needed.    [provider]  carvedilol (COREG) 3.125 MG tablet Take 1 tablet (3.125 mg total) by mouth 2 (two) times daily with a meal. 03/24/22 03/19/23  Alver Sorrow, NP  dapagliflozin propanediol (FARXIGA) 10 MG TABS tablet Take 1 tablet (10 mg total) by mouth daily. 03/24/22   Alver Sorrow, NP  furosemide (LASIX) 20 MG tablet Take 1 tablet (20 mg total) by mouth every  other day. Take an extra tablet as needed for swelling or a weight gain of 3 pounds or more in 24hrs 03/24/22   Walker, Storm Frisk, NP  isosorbide-hydrALAZINE (BIDIL) 20-37.5 MG tablet Take 1 tablet by mouth 3 (three) times daily. 03/24/22   Alver Sorrow, NP  lidocaine (LIDODERM) 5 % Place 1 patch onto the skin daily. Remove & Discard patch within 12 hours or as directed by MD 04/23/22   Roxy Horseman, PA-C  methocarbamol (ROBAXIN) 500 MG tablet Take 1 tablet (500 mg total) by mouth 2 (two) times daily. 04/23/22   Roxy Horseman, PA-C  nitroGLYCERIN (NITROSTAT) 0.4 MG SL tablet Place 1 tablet (0.4 mg total) under the tongue every 5 (five) minutes as needed for chest pain. 03/11/22   Cipriano Bunker, MD  potassium chloride (KLOR-CON M) 10 MEQ tablet Take 1 tablet (10 mEq total) by mouth every other day. 03/24/22   Alver Sorrow, NP  sacubitril-valsartan (ENTRESTO) 97-103 MG Take 1 tablet by mouth 2 (two) times daily. 04/16/22   Rollene Rotunda, MD  potassium chloride (KLOR-CON) 10 MEQ tablet Take 10 mEq by mouth daily.  03/11/22  [provider]      Allergies    Patient has no known allergies.    Review of Systems   Review of Systems  Physical Exam Updated Vital Signs There were no vitals taken for this visit. Physical Exam  ED Results / Procedures / Treatments  Labs (all labs ordered are listed, but only abnormal results are displayed) Labs Reviewed - No data to display  EKG None  Radiology No results found.  Procedures Procedures  {Document cardiac monitor, telemetry assessment procedure when appropriate:1}  Medications Ordered in ED Medications - No data to display  ED Course/ Medical Decision Making/ A&P                           Medical Decision Making  ***  {Document critical care time when appropriate:1} {Document review of labs and clinical decision tools ie heart score, Chads2Vasc2 etc:1}  {Document your independent review of radiology images, and any  outside records:1} {Document your discussion with family members, caretakers, and with consultants:1} {Document social determinants of health affecting pt's care:1} {Document your decision making why or why not admission, treatments were needed:1} Final Clinical Impression(s) / ED Diagnoses Final diagnoses:  None    Rx / DC Orders ED Discharge Orders     None

## 2022-05-05 ENCOUNTER — Observation Stay (HOSPITAL_COMMUNITY): Payer: Medicare Other

## 2022-05-05 DIAGNOSIS — Z1152 Encounter for screening for COVID-19: Secondary | ICD-10-CM | POA: Diagnosis not present

## 2022-05-05 DIAGNOSIS — I428 Other cardiomyopathies: Secondary | ICD-10-CM | POA: Diagnosis present

## 2022-05-05 DIAGNOSIS — J441 Chronic obstructive pulmonary disease with (acute) exacerbation: Secondary | ICD-10-CM | POA: Diagnosis present

## 2022-05-05 DIAGNOSIS — J9620 Acute and chronic respiratory failure, unspecified whether with hypoxia or hypercapnia: Secondary | ICD-10-CM

## 2022-05-05 DIAGNOSIS — I2729 Other secondary pulmonary hypertension: Secondary | ICD-10-CM | POA: Diagnosis present

## 2022-05-05 DIAGNOSIS — I442 Atrioventricular block, complete: Secondary | ICD-10-CM | POA: Diagnosis present

## 2022-05-05 DIAGNOSIS — Z79899 Other long term (current) drug therapy: Secondary | ICD-10-CM | POA: Diagnosis not present

## 2022-05-05 DIAGNOSIS — R0603 Acute respiratory distress: Secondary | ICD-10-CM

## 2022-05-05 DIAGNOSIS — I472 Ventricular tachycardia, unspecified: Secondary | ICD-10-CM | POA: Diagnosis present

## 2022-05-05 DIAGNOSIS — N1831 Chronic kidney disease, stage 3a: Secondary | ICD-10-CM | POA: Diagnosis present

## 2022-05-05 DIAGNOSIS — I5043 Acute on chronic combined systolic (congestive) and diastolic (congestive) heart failure: Secondary | ICD-10-CM | POA: Diagnosis present

## 2022-05-05 DIAGNOSIS — E1122 Type 2 diabetes mellitus with diabetic chronic kidney disease: Secondary | ICD-10-CM | POA: Diagnosis present

## 2022-05-05 DIAGNOSIS — J9601 Acute respiratory failure with hypoxia: Secondary | ICD-10-CM | POA: Diagnosis present

## 2022-05-05 DIAGNOSIS — E785 Hyperlipidemia, unspecified: Secondary | ICD-10-CM | POA: Diagnosis present

## 2022-05-05 DIAGNOSIS — F17210 Nicotine dependence, cigarettes, uncomplicated: Secondary | ICD-10-CM | POA: Diagnosis present

## 2022-05-05 DIAGNOSIS — Z7982 Long term (current) use of aspirin: Secondary | ICD-10-CM | POA: Diagnosis not present

## 2022-05-05 DIAGNOSIS — R053 Chronic cough: Secondary | ICD-10-CM | POA: Diagnosis present

## 2022-05-05 DIAGNOSIS — I130 Hypertensive heart and chronic kidney disease with heart failure and stage 1 through stage 4 chronic kidney disease, or unspecified chronic kidney disease: Secondary | ICD-10-CM | POA: Diagnosis present

## 2022-05-05 DIAGNOSIS — I471 Supraventricular tachycardia: Secondary | ICD-10-CM | POA: Diagnosis present

## 2022-05-05 DIAGNOSIS — I248 Other forms of acute ischemic heart disease: Secondary | ICD-10-CM | POA: Diagnosis present

## 2022-05-05 DIAGNOSIS — I2510 Atherosclerotic heart disease of native coronary artery without angina pectoris: Secondary | ICD-10-CM | POA: Diagnosis present

## 2022-05-05 LAB — RESPIRATORY PANEL BY PCR

## 2022-05-05 LAB — CBC
HCT: 52.2 % — ABNORMAL HIGH (ref 39.0–52.0)
Hemoglobin: 16.7 g/dL (ref 13.0–17.0)
MCH: 29.3 pg (ref 26.0–34.0)
MCHC: 32 g/dL (ref 30.0–36.0)
MCV: 91.6 fL (ref 80.0–100.0)
Platelets: 173 10*3/uL (ref 150–400)
RBC: 5.7 MIL/uL (ref 4.22–5.81)
RDW: 14.2 % (ref 11.5–15.5)
WBC: 5.3 10*3/uL (ref 4.0–10.5)
nRBC: 0 % (ref 0.0–0.2)

## 2022-05-05 LAB — COMPREHENSIVE METABOLIC PANEL
ALT: 36 U/L (ref 0–44)
AST: 35 U/L (ref 15–41)
Albumin: 4.1 g/dL (ref 3.5–5.0)
Alkaline Phosphatase: 62 U/L (ref 38–126)
Anion gap: 14 (ref 5–15)
BUN: 20 mg/dL (ref 8–23)
CO2: 22 mmol/L (ref 22–32)
Calcium: 9.1 mg/dL (ref 8.9–10.3)
Chloride: 101 mmol/L (ref 98–111)
Creatinine, Ser: 1.56 mg/dL — ABNORMAL HIGH (ref 0.61–1.24)
GFR, Estimated: 47 mL/min — ABNORMAL LOW (ref >60)
Glucose, Bld: 135 mg/dL — ABNORMAL HIGH (ref 70–99)
Potassium: 4 mmol/L (ref 3.5–5.1)
Sodium: 137 mmol/L (ref 135–145)
Total Bilirubin: 0.5 mg/dL (ref 0.3–1.2)
Total Protein: 7.1 g/dL (ref 6.5–8.1)

## 2022-05-05 LAB — CBG MONITORING, ED
Glucose-Capillary: 182 mg/dL — ABNORMAL HIGH (ref 70–99)
Glucose-Capillary: 165 mg/dL — ABNORMAL HIGH (ref 70–99)

## 2022-05-05 LAB — GLUCOSE, CAPILLARY
Glucose-Capillary: 195 mg/dL — ABNORMAL HIGH (ref 70–99)
Glucose-Capillary: 149 mg/dL — ABNORMAL HIGH (ref 70–99)

## 2022-05-05 MED ORDER — METHYLPREDNISOLONE SODIUM SUCC 125 MG IJ SOLR
60.0000 mg | INTRAMUSCULAR | Status: DC
  Administered 2022-05-05 – 2022-05-06 (×2): 60 mg via INTRAVENOUS
  Filled 2022-05-05 (×2): qty 2

## 2022-05-05 MED ORDER — HYDRALAZINE HCL 20 MG/ML IJ SOLN: 10.0000 mg | Freq: Four times a day (QID) | INTRAMUSCULAR | Status: DC | PRN

## 2022-05-05 MED ORDER — INSULIN ASPART 100 UNIT/ML IJ SOLN: 0.0000 [IU] | Freq: Every day | INTRAMUSCULAR | Status: DC

## 2022-05-05 MED ORDER — ISOSORB DINITRATE-HYDRALAZINE 20-37.5 MG PO TABS
1.0000 | ORAL_TABLET | Freq: Three times a day (TID) | ORAL | Status: DC
  Administered 2022-05-05 – 2022-05-07 (×7): 1 via ORAL
  Filled 2022-05-05 (×9): qty 1

## 2022-05-05 MED ORDER — GUAIFENESIN-DM 100-10 MG/5ML PO SYRP
5.0000 mL | ORAL_SOLUTION | ORAL | Status: DC | PRN
  Administered 2022-05-05 – 2022-05-07 (×8): 5 mL via ORAL
  Filled 2022-05-05 (×8): qty 5

## 2022-05-05 MED ORDER — SACUBITRIL-VALSARTAN 97-103 MG PO TABS
1.0000 | ORAL_TABLET | Freq: Two times a day (BID) | ORAL | Status: DC
  Administered 2022-05-05 – 2022-05-07 (×5): 1 via ORAL
  Filled 2022-05-05 (×6): qty 1

## 2022-05-05 MED ORDER — LIVING WELL WITH DIABETES BOOK
Freq: Once | Status: AC
  Filled 2022-05-05 (×2): qty 1

## 2022-05-05 MED ORDER — CARVEDILOL 6.25 MG PO TABS
6.2500 mg | ORAL_TABLET | Freq: Two times a day (BID) | ORAL | Status: DC
  Administered 2022-05-05 – 2022-05-07 (×5): 6.25 mg via ORAL
  Filled 2022-05-05: qty 1
  Filled 2022-05-05: qty 2
  Filled 2022-05-05 (×3): qty 1

## 2022-05-05 MED ORDER — IPRATROPIUM-ALBUTEROL 0.5-2.5 (3) MG/3ML IN SOLN
3.0000 mL | Freq: Four times a day (QID) | RESPIRATORY_TRACT | Status: DC
  Administered 2022-05-05 (×3): 3 mL via RESPIRATORY_TRACT
  Filled 2022-05-05 (×3): qty 3

## 2022-05-05 MED ORDER — HYDRALAZINE HCL 20 MG/ML IJ SOLN: 5.0000 mg | Freq: Four times a day (QID) | INTRAMUSCULAR | Status: DC | PRN

## 2022-05-05 MED ORDER — FUROSEMIDE 10 MG/ML IJ SOLN
20.0000 mg | Freq: Once | INTRAMUSCULAR | Status: AC
  Administered 2022-05-05: 20 mg via INTRAVENOUS
  Filled 2022-05-05: qty 2

## 2022-05-05 MED ORDER — NITROGLYCERIN 0.4 MG SL SUBL: 0.4000 mg | SUBLINGUAL_TABLET | SUBLINGUAL | Status: DC | PRN

## 2022-05-05 MED ORDER — INSULIN ASPART 100 UNIT/ML IJ SOLN
0.0000 [IU] | Freq: Three times a day (TID) | INTRAMUSCULAR | Status: DC
  Administered 2022-05-05 (×3): 2 [IU] via SUBCUTANEOUS
  Administered 2022-05-06: 1 [IU] via SUBCUTANEOUS
  Administered 2022-05-06: 2 [IU] via SUBCUTANEOUS
  Administered 2022-05-06: 1 [IU] via SUBCUTANEOUS

## 2022-05-05 MED ORDER — AZITHROMYCIN 250 MG PO TABS
250.0000 mg | ORAL_TABLET | Freq: Every day | ORAL | Status: DC
  Administered 2022-05-05 – 2022-05-07 (×3): 250 mg via ORAL
  Filled 2022-05-05 (×3): qty 1

## 2022-05-05 MED ORDER — FUROSEMIDE 10 MG/ML IJ SOLN: 60.0000 mg | Freq: Every day | INTRAMUSCULAR | Status: DC

## 2022-05-05 MED ORDER — FUROSEMIDE 10 MG/ML IJ SOLN
40.0000 mg | Freq: Every day | INTRAMUSCULAR | Status: DC
  Administered 2022-05-05: 40 mg via INTRAVENOUS
  Filled 2022-05-05: qty 4

## 2022-05-05 NOTE — Inpatient Diabetes Management (Addendum)
Inpatient Diabetes Program Recommendations  AACE/ADA: New Consensus Statement on Inpatient Glycemic Control   Target Ranges:  Prepandial:   less than 140 mg/dL      Peak postprandial:   less than 180 mg/dL (1-2 hours)      Critically ill patients:  140 - 180 mg/dL    Latest Reference Range & Units 05/05/22 09:16  Glucose-Capillary 70 - 99 mg/dL 161 (H)    Latest Reference Range & Units 05/04/22 21:51 05/05/22 01:23  Glucose 70 - 99 mg/dL 096 (H) 045 (H)    Latest Reference Range & Units 03/07/22 14:22  Hemoglobin A1C 4.8 - 5.6 % 6.9 (H)   Review of Glycemic Control  Diabetes history: DM2 Outpatient Diabetes medications: Farxiga 10 mg daily Current orders for Inpatient glycemic control: Farxgia 10 mg daily, Novolog 0-9 units TID with meals, Novolog 0-5 units QHS; Solumedrol 60 mg Q24H   NOTE: Noted consult for diabetes coordinator. Chart reviewed. Per chart, patient has DM2 hx and takes Farxiga 10 mg daily as an outpatient. Noted patient received Solumedrol 125 mg at 22:03 on 05/04/22 and Solumedrol 6 mg today at 7:54 am which is contributing to hyperglycemia.  Addendum 05/05/22@14 :40-Spoke with patient at bedside. Inquired about DM hx. Patient states that doctors have mentioned DM to him in past. Patient reports that he does not know much of anything about DM. Patient was sleepy and kept drifting off to sleep during conversation. Informed patient that a Living Well with DM book would be ordered; encouraged patient to read through entire book. Informed patient that diabetes coordinator will follow up with him tomorrow when he is more alert. Patient stated he did not get any sleep last night and he is very tired. Will plan to follow up tomorrow.  Thanks, Orlando Penner, RN, MSN, [facility] Diabetes Coordinator Inpatient Diabetes Program [phone] (Team Pager from 8am to 5pm)

## 2022-05-05 NOTE — Progress Notes (Signed)
Text paged Dr. Delynn Flavin with above information.

## 2022-05-05 NOTE — Progress Notes (Signed)
2114 Received call from CCMD patient had 20 beat run ventricular tachycardia returning to sinus rhythm.Upon entering room patient sitting up in bed watching TV denies chest pain or shortness of breath. Temp. 97.9 HR 90 B/P 101/64 RR 18 sats 92 % on 3 lnc.2138 Dr. Margo Aye paged.2200 MD returned call plan to continue with current plan of care and notify on call cardiology.

## 2022-05-05 NOTE — Progress Notes (Signed)
Patients WOB has improved greatly and wished to try off BIPAP. Rt placed on 2L nasal canula. vital signs stable at this time.RN aware. Patient asked to use call bell if his breathing becomes worse.

## 2022-05-05 NOTE — Evaluation (Signed)
Physical Therapy Evaluation Patient Details Name: Marcus Tran MRN: [id number] DOB: Aug 13, 1951 Today's Date: 05/05/2022  History of Present Illness  71 y.o. male admitted 7/17, has HTN and acute respiratory failure with new O2 use 3L. Has hypoxic/hypercarbic resp failure and acute systolic heart failure,diuresing. HTN managed with nitro drip.   PMH includes progressive obstructive lung disease and tobacco use, CKD 3a, B hilar adenopathy, HTN, SOB, CHF,  Clinical Impression  Pt was seen for mobility with no AD but use of 3L O2.  Does have some strength changes in LE's along with his new use of O2, so will recommend PT see for strengthening and monitoring of endurance needs for O2.  Pt is likely to be able to go home without O2 but asking for HHPT currently to help manage strength and need for O2.  May be able to get past O2 need faster with acute rehab for goals as are outlined below.       Recommendations for follow up therapy are one component of a multi-disciplinary discharge planning process, led by the attending physician.  Recommendations may be updated based on patient status, additional functional criteria and insurance authorization.  Follow Up Recommendations Home health PT      Assistance Recommended at Discharge Intermittent Supervision/Assistance  Patient can return home with the following  A little help with walking and/or transfers;A little help with bathing/dressing/bathroom;Assistance with cooking/housework;Assist for transportation;Help with stairs or ramp for entrance    Equipment Recommendations None recommended by PT  Recommendations for Other Services       Functional Status Assessment Patient has had a recent decline in their functional status and demonstrates the ability to make significant improvements in function in a reasonable and predictable amount of time.     Precautions / Restrictions Precautions Precautions: Fall;Other (comment) Precaution Comments:  monitor O2 sats Restrictions Weight Bearing Restrictions: No      Mobility  Bed Mobility Overal bed mobility: Needs Assistance Bed Mobility: Supine to Sit, Sit to Supine     Supine to sit: Min guard Sit to supine: Min guard   General bed mobility comments: min guard for safety    Transfers Overall transfer level: Needs assistance Equipment used: None Transfers: Sit to/from Stand Sit to Stand: Min guard           General transfer comment: Min guard A for safety    Ambulation/Gait Ambulation/Gait assistance: Min guard Gait Distance (Feet): 90 Feet   Gait Pattern/deviations: Step-through pattern, Decreased stride length, Shuffle Gait velocity: reduced Gait velocity interpretation: <1.31 ft/sec, indicative of household ambulator Pre-gait activities: standing balance ck General Gait Details: pt is on 3L O2 and maintaining O2 sats with the use of tank  Stairs            Wheelchair Mobility    Modified Rankin (Stroke Patients Only)       Balance Overall balance assessment: Mild deficits observed, not formally tested                                           Pertinent Vitals/Pain Pain Assessment Pain Assessment: Faces Faces Pain Scale: No hurt    Home Living Family/patient expects to be discharged to:: Private residence Living Arrangements: Children;Other relatives Available Help at Discharge: Family;Available 24 hours/day Type of Home: House Home Access: Stairs to enter   [employer] of Steps: 1   Home  Layout: One level Home Equipment: None      Prior Function Prior Level of Function : Independent/Modified Independent             Mobility Comments: SPC at home ADLs Comments: performs ADLs and IADLs. cooks, cleans, and cares for the five gdogs     Hand Dominance        Extremity/Trunk Assessment   Upper Extremity Assessment Upper Extremity Assessment: Defer to OT evaluation    Lower Extremity  Assessment Lower Extremity Assessment: Overall WFL for tasks assessed    Cervical / Trunk Assessment Cervical / Trunk Assessment: Kyphotic  Communication   Communication: No difficulties  Cognition Arousal/Alertness: Awake/alert Behavior During Therapy: WFL for tasks assessed/performed Overall Cognitive Status: Within Functional Limits for tasks assessed                                          General Comments General comments (skin integrity, edema, etc.): pt is on 3L and maintaining up to 92% with the O2, not previously on O2 and may not need it    Exercises     Assessment/Plan    PT Assessment Patient needs continued PT services  PT Problem List Decreased strength;Decreased activity tolerance;Decreased mobility       PT Treatment Interventions DME instruction;Gait training;Stair training;Therapeutic activities;Functional mobility training;Therapeutic exercise;Balance training;Neuromuscular re-education;Patient/family education    PT Goals (Current goals can be found in the Care Plan section)  Acute Rehab PT Goals Patient Stated Goal: to go home and get stronger PT Goal Formulation: With patient Time For Goal Achievement: 05/12/22 Potential to Achieve Goals: Good    Frequency Min 3X/week     Co-evaluation               AM-PAC PT "6 Clicks" Mobility  Outcome Measure Help needed turning from your back to your side while in a flat bed without using bedrails?: None Help needed moving from lying on your back to sitting on the side of a flat bed without using bedrails?: A Little Help needed moving to and from a bed to a chair (including a wheelchair)?: A Little Help needed standing up from a chair using your arms (e.g., wheelchair or bedside chair)?: A Little Help needed to walk in hospital room?: A Little Help needed climbing 3-5 steps with a railing? : A Little 6 Click Score: 19    End of Session Equipment Utilized During Treatment: Gait  belt;Oxygen Activity Tolerance: Patient tolerated treatment well Patient left: in bed;with call bell/phone within reach Nurse Communication: Mobility status PT Visit Diagnosis: Unsteadiness on feet (R26.81);Muscle weakness (generalized) (M62.81)    Time: 9563-8756 PT Time Calculation (min) (ACUTE ONLY): 25 min   Charges:   PT Evaluation $PT Eval Moderate Complexity: 1 Mod PT Treatments $Gait Training: 8-22 mins       Ivar Drape 05/05/2022, 5:35 PM  Samul Dada, PT PhD Acute Rehab Dept. Number: [facility] [id number] and [facility] [phone]

## 2022-05-05 NOTE — Progress Notes (Signed)
PROGRESS NOTE                                                                                                                                                                                                             Patient Demographics:    Marcus Tran, is a 71 y.o. male, DOB - April 22, 1951, ATF:[id number]  Outpatient Primary MD for the patient is Hillery Aldo, NP    LOS - 0  Admit date - 05/04/2022    Chief Complaint  Patient presents with   Respiratory Distress       Brief Narrative (HPI from H&P)   71 y.o. male with medical history significant of bilateral hilar adenopathy, hypertension, CKD 3A, CAD, systolic and diastolic heart failure, third-degree heart block, hyperlipidemia, elevated A1c, chronic lung disease without diagnosis presenting with worsening shortness of breath, was diagnosed with acute hypoxic respiratory failure due to CHF and COPD exacerbation, was also found to have extremely high blood pressure placed on nitro drip, initially required BiPAP and subsequently on 5 L nasal cannula oxygen, admitted for further care.   Subjective:    Marcus Tran today has, No headache, No chest pain, No abdominal pain - No Nausea, No new weakness tingling or numbness, improved SOB.   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  Currently placed on combination of Coreg, Entresto, Farxiga and BiDil along with diuretic, continue supplemental oxygen, add flutter valve and I-S for pulmonary toiletry.  Advance activity and monitor.   Possible COPD  exacerbation.  Counseled to quit smoking, nebulizer treatments, IV Solu-Medrol, add azithromycin for atypical coverage and oxygen needed, pulmonary toiletry as above.  Essential hypertension.  In poor control medications adjusted as in #1 above.  Monitor.  CKD 3A.  Baseline creatinine around 1.5.  Monitor.  CAD .  Continue aspirin, statin, Coreg and BiDil combination for secondary prevention, no chest pain or acute issues.  Troponin trend is flat and in non-ACS pattern, likely  due to demand ischemia from #1 above.  Dyslipidemia.  On statin.  Ongoing smoking.  Counseled to quit.  DM type II.  A1c 6.9, ISS.  DM education.  Lab Results  Component Value Date   HGBA1C 6.9 (H) 03/07/2022    CBG (last 3)  No results for input(s): "GLUCAP" in the last 72 hours.       Condition - Extremely Guarded  Family Communication  :  none present  Code Status :  Full  Consults  :  None  PUD Prophylaxis :    Procedures  :     Echo -       Disposition Plan  :    Status is: Observation  DVT Prophylaxis  :    enoxaparin (LOVENOX) injection 40 mg Start: 05/05/22 0800    Lab Results  Component Value Date   PLT 173 05/05/2022    Diet :  Diet Order             DIET SOFT Room service appropriate? Yes; Fluid consistency: Thin; Fluid restriction: 1200 mL Fluid  Diet effective now                    Inpatient Medications  Scheduled Meds:  aspirin EC  81 mg Oral Daily   atorvastatin  80 mg Oral QPM   carvedilol  6.25 mg Oral BID WC   dapagliflozin propanediol  10 mg Oral Daily   enoxaparin (LOVENOX) injection  40 mg Subcutaneous Q24H   furosemide  20 mg Intravenous Once   [START ON 05/06/2022] furosemide  60 mg Intravenous Daily   ipratropium-albuterol  3 mL Nebulization Q6H   isosorbide-hydrALAZINE  1 tablet Oral TID   methylPREDNISolone (SOLU-MEDROL) injection  60 mg Intravenous Q24H   sacubitril-valsartan  1 tablet Oral BID   Continuous Infusions: PRN  Meds:.acetaminophen **OR** acetaminophen, albuterol, guaiFENesin-dextromethorphan, hydrALAZINE, nitroGLYCERIN, polyethylene glycol  Antibiotics  :    Anti-infectives (From admission, onward)    None        Time Spent in minutes  30   Susa Raring M.D on 05/05/2022 at 8:32 AM  To page go to [email or web address]   [facility] -  Office  [phone]  See all Orders from today for further details    Objective:   Vitals:   05/05/22 0615 05/05/22 0630 05/05/22 0715 05/05/22 0720  BP: 120/81 109/84 (!) 179/104 (!) 165/104  Pulse: 86 85 79 83  Resp: 15 16 15 14   Temp:      TempSrc:      SpO2: 93% 92% 97% 97%  Weight:      Height:        Wt Readings from Last 3 Encounters:  05/04/22 70 kg  04/16/22 70.4 kg  04/16/22 70.6 kg     Intake/Output Summary (Last 24 hours) at 05/05/2022 0832 Last data filed at 05/05/2022 0225 Gross per 24 hour  Intake --  Output 300 ml  Net -300 ml     Physical Exam  Awake Alert, No new F.N deficits, Normal affect [name].AT,PERRAL Supple Neck, No JVD,   Symmetrical Chest wall movement, Good air movement bilaterally, ++ wheezes and rales RRR,No Gallops,Rubs or new Murmurs,  +ve B.Sounds, Abd Soft, No tenderness,   No Cyanosis, Clubbing or edema        Data Review:    CBC Recent Labs  Lab 05/04/22 2151 05/04/22 2219 05/05/22 0123  WBC 4.9  --  5.3  HGB 16.5 17.7* 16.7  HCT 51.5  52.0 52.2*  PLT 174  --  173  MCV 91.5  --  91.6  MCH 29.3  --  29.3  MCHC 32.0  --  32.0  RDW 14.2  --  14.2  LYMPHSABS 1.4  --   --   MONOABS 0.5  --   --   EOSABS 0.6*  --   --   BASOSABS 0.1  --   --     Electrolytes Recent Labs  Lab 05/04/22 2151 05/04/22 2219 05/05/22 0123  NA 139 138 137  K 4.2 4.0 4.0  CL 103  --  101  CO2 27  --  22  GLUCOSE 119*  --  135*  BUN 18  --  20  CREATININE 1.41*  --  1.56*  CALCIUM 9.1  --  9.1  AST 34  --  35  ALT 38  --  36  ALKPHOS 63  --  62  BILITOT 1.2  --  0.5  ALBUMIN 4.0  --  4.1   MG 2.2  --   --   BNP 247.8*  --   --     ------------------------------------------------------------------------------------------------------------------ No results for input(s): "CHOL", "HDL", "LDLCALC", "TRIG", "CHOLHDL", "LDLDIRECT" in the last 72 hours.  Lab Results  Component Value Date   HGBA1C 6.9 (H) 03/07/2022    No results for input(s): "TSH", "T4TOTAL", "T3FREE", "THYROIDAB" in the last 72 hours.  Invalid input(s): "FREET3" ------------------------------------------------------------------------------------------------------------------ ID Labs Recent Labs  Lab 05/04/22 2151 05/05/22 0123  WBC 4.9 5.3  PLT 174 173  CREATININE 1.41* 1.56*    Radiology Reports DG Chest Port 1 View  Result Date: 05/05/2022 CLINICAL DATA:  Shortness of breath and respiratory distress EXAM: PORTABLE CHEST 1 VIEW COMPARISON:  Yesterday FINDINGS: Increased heart size in the setting of lower lung volumes. Stable mediastinal contours. Interstitial coarsening and vascular prominence in the setting of low lung volumes. There is no [employer], consolidation, effusion, or pneumothorax. Artifact from EKG leads. IMPRESSION: Limited low volume chest.  No convincing change from yesterday. Electronically Signed   By: Tiburcio Pea M.D.   On: 05/05/2022 07:18   DG Chest Port 1 View  Result Date: 05/04/2022 CLINICAL DATA:  Dyspnea. EXAM: PORTABLE CHEST 1 VIEW COMPARISON:  Chest radiograph dated 04/22/2022. FINDINGS: Mild chronic interstitial coarsening. No focal consolidation, pleural effusion or pneumothorax. The left costophrenic angle has been excluded from the image. The cardiac silhouette is within limits. Prominence of the central pulmonary arteries bilaterally suggestive of pulmonary hypertension. No acute osseous pathology. IMPRESSION: No active disease. Electronically Signed   By: Elgie Collard M.D.   On: 05/04/2022 22:18

## 2022-05-05 NOTE — Evaluation (Signed)
Occupational Therapy Evaluation Patient Details Name: Marcus Tran MRN: [id number] DOB: January 27, 1951 Today's Date: 05/05/2022   History of Present Illness 71 yo male presenting to ED on 7/17 with shortness of breath. PMH including bilateral hilar adenopathy, hypertension, CKD 3A, CAD, systolic and diastolic heart failure, third-degree heart block, hyperlipidemia, elevated A1c, chronic lung disease.   Clinical Impression   PTA, pt was living with his daughter and his five grandchildren and reports he is very independent performing ADLs and IADLs. Pt currently performing ADLs and functional mobility at Supervision-Min guard A level. SpO2 92-87% on RA during activity. Pt would benefit from further acute OT to facilitate safe dc. Recommend dc to home once medically stable per physician.       Recommendations for follow up therapy are one component of a multi-disciplinary discharge planning process, led by the attending physician.  Recommendations may be updated based on patient status, additional functional criteria and insurance authorization.   Follow Up Recommendations  No OT follow up    Assistance Recommended at Discharge Frequent or constant Supervision/Assistance  Patient can return home with the following      Functional Status Assessment  Patient has had a recent decline in their functional status and demonstrates the ability to make significant improvements in function in a reasonable and predictable amount of time.  Equipment Recommendations  Tub/shower seat (Pending progress)    Recommendations for Other Services       Precautions / Restrictions Precautions Precautions: Fall;Other (comment) (watch SpO2)      Mobility Bed Mobility Overal bed mobility: Needs Assistance Bed Mobility: Supine to Sit, Sit to Supine     Supine to sit: Supervision Sit to supine: Supervision   General bed mobility comments: Supervision for safety    Transfers Overall transfer level: Needs  assistance Equipment used: None Transfers: Sit to/from Stand Sit to Stand: Min guard           General transfer comment: Min guard A for safety      Balance Overall balance assessment: No apparent balance deficits (not formally assessed)                                         ADL either performed or assessed with clinical judgement   ADL Overall ADL's : Needs assistance/impaired Eating/Feeding: Set up;Sitting   Grooming: Oral care;Supervision/safety;Set up;Standing   Upper Body Bathing: Supervision/ safety;Set up;Sitting   Lower Body Bathing: Min guard;Sit to/from stand   Upper Body Dressing : Supervision/safety;Set up;Sitting   Lower Body Dressing: Min guard;Sit to/from stand   Toilet Transfer: Min guard;Ambulation (simulated to recliner)           Functional mobility during ADLs: Min guard General ADL Comments: Pt presenting with decreased activity tolerance as seen by drops in Spo2. Pt denying SOB or fatigue during mobility. Spo2 dropping to 92-87% on RA     Vision         Perception     Praxis      Pertinent Vitals/Pain Pain Assessment Pain Assessment: Faces Faces Pain Scale: No hurt Pain Intervention(s): Monitored during session     Hand Dominance     Extremity/Trunk Assessment Upper Extremity Assessment Upper Extremity Assessment: Overall WFL for tasks assessed   Lower Extremity Assessment Lower Extremity Assessment: Defer to PT evaluation   Cervical / Trunk Assessment Cervical / Trunk Assessment: Kyphotic   Communication Communication Communication:  No difficulties   Cognition Arousal/Alertness: Awake/alert Behavior During Therapy: WFL for tasks assessed/performed Overall Cognitive Status: Within Functional Limits for tasks assessed                                       General Comments  SpO2 93% on RA at rest. SpO2 dropping to 92-87% on RA during mobility. Ending session on 2L at rest and SpO2  93%    Exercises     Shoulder Instructions      Home Living Family/patient expects to be discharged to:: Private residence Living Arrangements: Children;Other relatives Available Help at Discharge: Family;Available 24 hours/day Type of Home: House Home Access: Stairs to enter [employer] of Steps: 1   Home Layout: One level     Bathroom Shower/Tub: Chief Strategy Officer: Standard     Home Equipment: None          Prior Functioning/Environment Prior Level of Function : Independent/Modified Independent               ADLs Comments: performs ADLs and IADLs. cooks, cleans, and cares for the five gdogs        OT Problem List: Decreased strength;Decreased range of motion;Decreased activity tolerance;Impaired balance (sitting and/or standing);Decreased knowledge of use of DME or AE;Decreased knowledge of precautions      OT Treatment/Interventions: Self-care/ADL training;Therapeutic exercise;Energy conservation;DME and/or AE instruction;Therapeutic activities;Patient/family education    OT Goals(Current goals can be found in the care plan section) Acute Rehab OT Goals Patient Stated Goal: Go home OT Goal Formulation: With patient Time For Goal Achievement: 05/19/22 Potential to Achieve Goals: Good  OT Frequency: Min 2X/week    Co-evaluation              AM-PAC OT "6 Clicks" Daily Activity     Outcome Measure Help from another person eating meals?: A Little Help from another person taking care of personal grooming?: A Little Help from another person toileting, which includes using toliet, bedpan, or urinal?: A Little Help from another person bathing (including washing, rinsing, drying)?: A Little Help from another person to put on and taking off regular upper body clothing?: A Little Help from another person to put on and taking off regular lower body clothing?: A Little 6 Click Score: 18   End of Session Nurse Communication:  Mobility status  Activity Tolerance: Patient tolerated treatment well Patient left: in bed;with call bell/phone within reach;with bed alarm set  OT Visit Diagnosis: Other abnormalities of gait and mobility (R26.89);Unsteadiness on feet (R26.81);Muscle weakness (generalized) (M62.81)                Time: 6629-4765 OT Time Calculation (min): 9 min Charges:  OT General Charges $OT Visit: 1 Visit OT Evaluation $OT Eval Low Complexity: 1 Low  Skai Lickteig MSOT, OTR/L Acute Rehab Office: [phone]  Theodoro Grist Dayveon Halley 05/05/2022, 4:47 PM

## 2022-05-05 NOTE — ED Notes (Signed)
Messaged Dr. Margo Aye concerning patient's NPO order, she is going to adjust orders for patient to take meds. Respiratory was at bedside and removed Bipap at this time. Patient is on 2 Liters via [place], will monitor patient.

## 2022-05-05 NOTE — ED Notes (Signed)
Attempted to draw repeat troponin, unable to draw from line, phlebotomy to room to attempt to draw labs.

## 2022-05-05 NOTE — ED Notes (Signed)
Spoke with Dr. Alinda Money, verbal order to stop nitro at this time and monitor patient's BP. Medication stopped.

## 2022-05-05 NOTE — Consult Note (Addendum)
Cardiology Consultation:   Patient ID: Marcus Tran MRN: [id number]; DOB: May 09, 1951  Admit date: 05/04/2022 Date of Consult: 05/05/2022  PCP:  Marcus Aldo, NP   [facility] Providers Cardiologist:  Marcus Rotunda, MD     Patient Profile:   Marcus Tran is a 71 y.o. male with a hx of bilateral hilar adenopathy, HTN, CKD stage IIIa, CAD, systolic and diastolic CHF, third-degree heart block, HLD, chronic lung disease who is being seen 05/05/2022 for the evaluation of CHF at the request of Dr. Thedore Tran.  History of Present Illness:   Marcus Tran is a 71 year old male with above medical history who is followed by Dr. Antoine Tran.  Per chart review, patient was first seen by [facility] in May 2023 while he was admitted to the hospital for evaluation of acute respiratory failure.  Patient had an echocardiogram completed on 03/06/2022 that showed EF 35%, grade 2 diastolic dysfunction, normal RV systolic function, moderately elevated pulmonary artery systolic pressure, mild dilation of LA and RA. For further evaluation of his newly reduced EF, patient underwent a right/left heart catheterization on 03/09/2022 that showed 50% stenosis of the first OM, but otherwise no significant CAD.  Heart catheterization showed elevated filling pressures.  Patient was diuresed, started on guideline directed medical therapy.  \  Patient was readmitted to the hospital from 6/2 - 6/4 with acute on chronic respiratory failure with hypoxia that was thought to be secondary to COPD exacerbation.  It was thought that there was also some component of CHF, patient was diuresed with IV Lasix and discharged at 151 pounds.  Patient was seen on 03/24/2022 for follow-up appointment in the clinic.  At that time, patient had a 14-day ZIO patch placed in the clinic to determine PVC burden.  Monitor showed predominantly sinus rhythm, however he did have runs of ventricular tachycardia with the longest being 5 beats.  There were  bradycardic arrhythmias with intermittent third-degree heart block, there are also runs of SVT.  Patient was most recently seen by cardiology on 04/16/2022 by Dr. Antoine Tran.  At that appointment, patient was doing well and denied any presyncope/syncope, palpitations, orthopnea.  He did notice some increased shortness of breath, however he did have chronic dyspnea on exertion.  Patient presented to the ED on 7/17 complaining of shortness of breath that was not relieved with multiple nebs at home. Was brought in by EMS on CPAP. Weaned to 2-3 L via nasal cannula in the ED.   Labs in the ED showed Na 139, K 4.2, creatinine 1.41, mag 2.2, WBC 4.9, hemoglobin 16.5, platelets 174. BNP elevated to 247.8. hsTn 30.  CXR showed no active disease.  EKG showed sinus tachycardia, HR 111, biatrial enlargement, RBBB.   On interview, patient reports that he has had worsening SOB on exertion for a week. Symptoms are only present on exertion, and are slightly worse than baseline (patient does have chronic SOB on exertion). He denies any weight gain, ankle edema, abdominal distention. Denies orthopnea or chest pain. Has nebulizer treatments that were helping up until yesterday when his symptoms worsened. Patient believes his symptoms got worse yesterday because he was outside in the humidity and with the poor air quality.   Past Medical History:  Diagnosis Date   Acute metabolic encephalopathy 03/08/2022   Acute respiratory failure with hypoxia (HCC) 03/20/2022   Acute respiratory failure with hypoxia and hypercapnia (HCC) 03/06/2022   CHF (congestive heart failure) (HCC)    Dyslipidemia    Hypertension  Past Surgical History:  Procedure Laterality Date   LEG SURGERY     Right-sided as result of trauma   RIGHT/LEFT HEART CATH AND CORONARY ANGIOGRAPHY N/A 03/09/2022   Procedure: RIGHT/LEFT HEART CATH AND CORONARY ANGIOGRAPHY;  Surgeon: Marcus Gess, MD;  Location: [facility] INVASIVE CV LAB;  Service: Cardiovascular;   Laterality: N/A;   Umbilicus surgery     As a child     Home Medications:  Prior to Admission medications   Medication Sig Start Date End Date Taking? Authorizing Provider  albuterol (VENTOLIN HFA) 108 (90 Base) MCG/ACT inhaler Inhale 2 puffs into the lungs every 4 (four) hours as needed for wheezing or shortness of breath. 04/06/22  Yes Marcus Tran, Marcus Sago, MD  aspirin EC 81 MG tablet Take 1 tablet (81 mg total) by mouth daily. Swallow whole. 03/24/22  Yes Marcus Sorrow, NP  atorvastatin (LIPITOR) 80 MG tablet Take 1 tablet (80 mg total) by mouth every evening. Patient taking differently: Take 80 mg by mouth daily. 03/24/22  Yes Marcus Sorrow, NP  Budeson-Glycopyrrol-Formoterol (BREZTRI AEROSPHERE) 160-9-4.8 MCG/ACT AERO Inhale 2 puffs into the lungs as needed.   Yes [provider]  carvedilol (COREG) 3.125 MG tablet Take 1 tablet (3.125 mg total) by mouth 2 (two) times daily with a meal. 03/24/22 03/19/23 Yes Marcus Sorrow, NP  dapagliflozin propanediol (FARXIGA) 10 MG TABS tablet Take 1 tablet (10 mg total) by mouth daily. 03/24/22  Yes Marcus Sorrow, NP  furosemide (LASIX) 20 MG tablet Take 1 tablet (20 mg total) by mouth every other day. Take an extra tablet as needed for swelling or a weight gain of 3 pounds or more in 24hrs 03/24/22  Yes Marcus Tran, Marcus Frisk, NP  ipratropium-albuterol (DUONEB) 0.5-2.5 (3) MG/3ML SOLN Inhale 3 mLs into the lungs in the morning, at noon, in the evening, and at bedtime. 04/30/22  Yes [provider]  isosorbide-hydrALAZINE (BIDIL) 20-37.5 MG tablet Take 1 tablet by mouth 3 (three) times daily. 03/24/22  Yes Marcus Sorrow, NP  loratadine (ALLERGY RELIEF) 10 MG tablet Take 10 mg by mouth at bedtime.   Yes [provider]  nitroGLYCERIN (NITROSTAT) 0.4 MG SL tablet Place 1 tablet (0.4 mg total) under the tongue every 5 (five) minutes as needed for chest pain. 03/11/22  Yes Marcus Bunker, MD  potassium chloride (KLOR-CON M) 10 MEQ  tablet Take 1 tablet (10 mEq total) by mouth every other day. 03/24/22  Yes Marcus Sorrow, NP  sacubitril-valsartan (ENTRESTO) 97-103 MG Take 1 tablet by mouth 2 (two) times daily. 04/16/22  Yes Hochrein, Fayrene Fearing, MD  umeclidinium-vilanterol (ANORO ELLIPTA) 62.5-25 MCG/ACT AEPB Inhale 1 puff into the lungs daily.   Yes [provider]  lidocaine (LIDODERM) 5 % Place 1 patch onto the skin daily. Remove & Discard patch within 12 hours or as directed by MD Patient not taking: Reported on 05/05/2022 04/23/22   Roxy Horseman, PA-C  methocarbamol (ROBAXIN) 500 MG tablet Take 1 tablet (500 mg total) by mouth 2 (two) times daily. Patient not taking: Reported on 05/05/2022 04/23/22   Roxy Horseman, PA-C    Inpatient Medications: Scheduled Meds:  aspirin EC  81 mg Oral Daily   atorvastatin  80 mg Oral QPM   azithromycin  250 mg Oral Daily   carvedilol  6.25 mg Oral BID WC   dapagliflozin propanediol  10 mg Oral Daily   enoxaparin (LOVENOX) injection  40 mg Subcutaneous Q24H   [START ON 05/06/2022] furosemide  60  mg Intravenous Daily   insulin aspart  0-5 Units Subcutaneous QHS   insulin aspart  0-9 Units Subcutaneous TID WC   ipratropium-albuterol  3 mL Nebulization Q6H   isosorbide-hydrALAZINE  1 tablet Oral TID   methylPREDNISolone (SOLU-MEDROL) injection  60 mg Intravenous Q24H   sacubitril-valsartan  1 tablet Oral BID   Continuous Infusions:  PRN Meds: acetaminophen **OR** acetaminophen, albuterol, guaiFENesin-dextromethorphan, hydrALAZINE, nitroGLYCERIN, polyethylene glycol  Allergies:   No Known Allergies  Social History:   Social History   Socioeconomic History   Marital status: Single    Spouse name: Not on file   Number of children: 1   Years of education: Not on file   Highest education level: 10th grade  Occupational History   Occupation: Disability  Tobacco Use   Smoking status: Former    Packs/day: 0.50    Years: 50.00    Total pack years: 25.00    Types:  Cigarettes    Quit date: 03/19/2022    Years since quitting: 0.1   Smokeless tobacco: Never   Tobacco comments:    Smoking cessation  Vaping Use   Vaping Use: Never used  Substance and Sexual Activity   Alcohol use: Not on file    Comment: socially   Drug use: Yes    Types: Marijuana    Comment: past   Sexual activity: Not on file  Other Topics Concern   Not on file  Social History Narrative   He lives with his daughter apparently and 5 grandchildren.  He reports that he smokes probably less than a pack of cigarettes a day.  He occasionally drinks a beer or liquor but not daily.   Social Determinants of Health   Financial Resource Strain: Low Risk  (03/11/2022)   Overall Financial Resource Strain (CARDIA)    Difficulty of Paying Living Expenses: Not very hard  Food Insecurity: No Food Insecurity (03/11/2022)   Hunger Vital Sign    Worried About Running Out of Food in the Last Year: Never true    Ran Out of Food in the Last Year: Never true  Transportation Needs: No Transportation Needs (04/16/2022)   PRAPARE - Hydrologist (Medical): No    Lack of Transportation (Non-Medical): No  Physical Activity: Not on file  Stress: Not on file  Social Connections: Not on file  Intimate Partner Violence: Not on file    Family History:   History reviewed. No pertinent family history.   ROS:  Please see the history of present illness.   All other ROS reviewed and negative.     Physical Exam/Data:   Vitals:   05/05/22 0900 05/05/22 0930 05/05/22 1030 05/05/22 1100  BP: (!) 166/112 (!) 145/88 136/81 124/76  Pulse: 96 92 88 81  Resp: 20 20 17 16   Temp:      TempSrc:      SpO2: 95% 98% 92% 93%  Weight:      Height:        Intake/Output Summary (Last 24 hours) at 05/05/2022 1238 Last data filed at 05/05/2022 0225 Gross per 24 hour  Intake --  Output 300 ml  Net -300 ml      05/04/2022   10:58 PM 04/16/2022    2:54 PM 04/16/2022    1:00 PM  Last 3  Weights  Weight (lbs) 154 lb 5.2 oz 155 lb 2 oz 155 lb 9.6 oz  Weight (kg) 70 kg 70.364 kg 70.58 kg  Body mass index is 25.68 kg/m.  General:  Pleasant elderly gentleman in no acute distress. Laying flat in the bed HEENT: normal Neck: no JVD Vascular: Radial pulses 2+ bilaterally Cardiac:  normal S1, S2; RRR; no murmur  Lungs:  Inspiratory and expiratory wheezes in all lung fields. No crackles or rhonchi  Abd: soft, nontender, no hepatomegaly  Ext: no edema Musculoskeletal:  No deformities, BUE and BLE strength normal and equal Skin: warm and dry  Neuro:  CNs 2-12 intact, no focal abnormalities noted Psych:  Normal affect   EKG:  The EKG was personally reviewed and demonstrates:  sinus tachycardia, HR 111, biatrial enlargement, RBBB. Telemetry:  Telemetry was personally reviewed and demonstrates:  Sinus rhythm, occasional PVCs and brief (3-7 beat runs) of NSVT  Relevant CV Studies:  Echocardiogram 03/06/22   1. Left ventricular ejection fraction, by estimation, is 35%. The left  ventricle has moderately decreased function. The left ventricle  demonstrates global hypokinesis. Left ventricular diastolic parameters are  consistent with Grade II diastolic  dysfunction (pseudonormalization).   2. Right ventricular systolic function is normal. The right ventricular  size is normal. There is moderately elevated pulmonary artery systolic  pressure. The estimated right ventricular systolic pressure is 54.0 mmHg.   3. Left atrial size was mildly dilated.   4. Right atrial size was mildly dilated.   5. The mitral valve is degenerative. Trivial mitral valve regurgitation.  No evidence of mitral stenosis. Moderate mitral annular calcification.   6. The aortic valve is tricuspid. There is mild calcification of the  aortic valve. Aortic valve regurgitation is not visualized. No aortic  stenosis is present.   7. The inferior vena cava is normal in size with <50% respiratory  variability,  suggesting right atrial pressure of 8 mmHg.   Laboratory Data:  High Sensitivity Troponin:   Recent Labs  Lab 05/04/22 2151  TROPONINIHS 30*     Chemistry Recent Labs  Lab 05/04/22 2151 05/04/22 2219 05/05/22 0123  NA 139 138 137  K 4.2 4.0 4.0  CL 103  --  101  CO2 27  --  22  GLUCOSE 119*  --  135*  BUN 18  --  20  CREATININE 1.41*  --  1.56*  CALCIUM 9.1  --  9.1  MG 2.2  --   --   GFRNONAA 53*  --  47*  ANIONGAP 9  --  14    Recent Labs  Lab 05/04/22 2151 05/05/22 0123  PROT 6.8 7.1  ALBUMIN 4.0 4.1  AST 34 35  ALT 38 36  ALKPHOS 63 62  BILITOT 1.2 0.5   Lipids No results for input(s): "CHOL", "TRIG", "HDL", "LABVLDL", "LDLCALC", "CHOLHDL" in the last 168 hours.  Hematology Recent Labs  Lab 05/04/22 2151 05/04/22 2219 05/05/22 0123  WBC 4.9  --  5.3  RBC 5.63  --  5.70  HGB 16.5 17.7* 16.7  HCT 51.5 52.0 52.2*  MCV 91.5  --  91.6  MCH 29.3  --  29.3  MCHC 32.0  --  32.0  RDW 14.2  --  14.2  PLT 174  --  173   Thyroid No results for input(s): "TSH", "FREET4" in the last 168 hours.  BNP Recent Labs  Lab 05/04/22 2151  BNP 247.8*    DDimer No results for input(s): "DDIMER" in the last 168 hours.   Radiology/Studies:  [facility] Chest Port 1 View  Result Date: 05/05/2022 CLINICAL DATA:  Shortness of breath and respiratory distress  EXAM: PORTABLE CHEST 1 VIEW COMPARISON:  Yesterday FINDINGS: Increased heart size in the setting of lower lung volumes. Stable mediastinal contours. Interstitial coarsening and vascular prominence in the setting of low lung volumes. There is no [employer], consolidation, effusion, or pneumothorax. Artifact from EKG leads. IMPRESSION: Limited low volume chest.  No convincing change from yesterday. Electronically Signed   By: Jorje Guild M.D.   On: 05/05/2022 07:18   DG Chest Port 1 View  Result Date: 05/04/2022 CLINICAL DATA:  Dyspnea. EXAM: PORTABLE CHEST 1 VIEW COMPARISON:  Chest radiograph dated 04/22/2022. FINDINGS:  Mild chronic interstitial coarsening. No focal consolidation, pleural effusion or pneumothorax. The left costophrenic angle has been excluded from the image. The cardiac silhouette is within limits. Prominence of the central pulmonary arteries bilaterally suggestive of pulmonary hypertension. No acute osseous pathology. IMPRESSION: No active disease. Electronically Signed   By: Anner Crete M.D.   On: 05/04/2022 22:18     Assessment and Plan:   Acute on Chronic Respiratory Failure Chronic Combined Systolic and Diastolic CHF  COPD , Chronic Lung Disease  - Patient presented complaining of worsening SOB requiring BiPAP in the ED-- now on 2-3 L via [place]  - BNP mildly elevated to 247.8 (of note, this is down from 1222 on 6/2 and 314.9 on 6/4)  - CXR without active disease  - Weight 154 lbs on admission. Suspect dry weight is 151 lbs  - So far, patient has received 2 doses of IV lasix 40 mg and one does of 20 mg - Patient with significant wheezing on exam, no crackles, JVD, ankle edema, or weight gain. I suspect that his chronic lung disease and COPD are contributing more to his SOB rather than his CHF.  - Transition to oral lasix tomorrow  - Continue carvedilol 6.25 mg BID - Continue farxiga 10 mg daily  - Continue bidil 20-37.5 mg TID  - Continue entresto 90-103 BID   Elevated Trop  HLD CAD - Initial hsTn elevated to 30  - Repeat trop was not drawn due to inability to draw from line - Very minimal trop elevation, suspect demand in the setting of hypoxia  - Patient had a heart catheterization on 03/09/22 that showed 50% stenosis in OM1, but was otherwise without significant CAD. Patient denies chest pain  - No further workup indicated at this time  - Continue statin, ASA, carvedilol   PVCs  NSVT - Telemetry shows sinus rhythm with PVCs, brief episodes of NSVT (3-7 beat runs)  - Continue carvedilol, titrate as tolerate by BP  - K 4.0, mag 2.2 this admission    Otherwise per primary   - COPD exacerbation  - CKD stage IIIa  - Type 2 DM   Risk Assessment/Risk Scores:   New York Heart Association (NYHA) Functional Class NYHA Class II        For questions or updates, please contact [facility] Please consult [email or web address] for contact info under      Signed, Margie Billet, PA-C  05/05/2022 12:38 PM  Personally seen and examined. Agree with above.  71 year old here with shortness of breath as a result of acute on chronic respiratory failure as well as acute on chronic systolic heart failure.  BNP less than where it has been in the past.  This may point to COPD greater than systolic heart failure.  Has received IV Lasix.  Weight previously was 151.  Currently 154 on admission.  Wheezes appreciated on exam.  Currently comfortable sleeping.  I agree, makes sense to transition to oral Lasix tomorrow and to continue with pulmonary toilet.  Carvedilol [place] BiDil Entresto on board.  We will continue to follow.  Candee Furbish, MD

## 2022-05-05 NOTE — ED Notes (Signed)
Patient's O2 staying around 88-90 % on 2 LPM via [place], increased to 3 LPM. Patient's O2 sat 94%.

## 2022-05-06 DIAGNOSIS — R0603 Acute respiratory distress: Secondary | ICD-10-CM | POA: Diagnosis not present

## 2022-05-06 DIAGNOSIS — J9601 Acute respiratory failure with hypoxia: Secondary | ICD-10-CM | POA: Diagnosis not present

## 2022-05-06 DIAGNOSIS — J441 Chronic obstructive pulmonary disease with (acute) exacerbation: Secondary | ICD-10-CM | POA: Diagnosis not present

## 2022-05-06 DIAGNOSIS — I2510 Atherosclerotic heart disease of native coronary artery without angina pectoris: Secondary | ICD-10-CM | POA: Diagnosis not present

## 2022-05-06 LAB — CBC WITH DIFFERENTIAL/PLATELET
Abs Immature Granulocytes: 0.05 10*3/uL (ref 0.00–0.07)
Basophils Absolute: 0 10*3/uL (ref 0.0–0.1)
Basophils Relative: 0 %
Eosinophils Absolute: 0.1 10*3/uL (ref 0.0–0.5)
Eosinophils Relative: 1 %
HCT: 46.3 % (ref 39.0–52.0)
Hemoglobin: 15.4 g/dL (ref 13.0–17.0)
Immature Granulocytes: 0 %
Lymphocytes Relative: 8 %
Lymphs Abs: 1 10*3/uL (ref 0.7–4.0)
MCH: 29.6 pg (ref 26.0–34.0)
MCHC: 33.3 g/dL (ref 30.0–36.0)
MCV: 88.9 fL (ref 80.0–100.0)
Monocytes Absolute: 1.2 10*3/uL — ABNORMAL HIGH (ref 0.1–1.0)
Monocytes Relative: 9 %
Neutro Abs: 10.1 10*3/uL — ABNORMAL HIGH (ref 1.7–7.7)
Neutrophils Relative %: 82 %
Platelets: 164 10*3/uL (ref 150–400)
RBC: 5.21 MIL/uL (ref 4.22–5.81)
RDW: 14 % (ref 11.5–15.5)
WBC: 12.3 10*3/uL — ABNORMAL HIGH (ref 4.0–10.5)
nRBC: 0 % (ref 0.0–0.2)

## 2022-05-06 LAB — COMPREHENSIVE METABOLIC PANEL
ALT: 29 U/L (ref 0–44)
AST: 21 U/L (ref 15–41)
Albumin: 3.3 g/dL — ABNORMAL LOW (ref 3.5–5.0)
Alkaline Phosphatase: 48 U/L (ref 38–126)
Anion gap: 9 (ref 5–15)
BUN: 27 mg/dL — ABNORMAL HIGH (ref 8–23)
CO2: 23 mmol/L (ref 22–32)
Calcium: 8.3 mg/dL — ABNORMAL LOW (ref 8.9–10.3)
Chloride: 99 mmol/L (ref 98–111)
Creatinine, Ser: 1.43 mg/dL — ABNORMAL HIGH (ref 0.61–1.24)
GFR, Estimated: 52 mL/min — ABNORMAL LOW (ref >60)
Glucose, Bld: 139 mg/dL — ABNORMAL HIGH (ref 70–99)
Potassium: 3.5 mmol/L (ref 3.5–5.1)
Sodium: 131 mmol/L — ABNORMAL LOW (ref 135–145)
Total Bilirubin: 1 mg/dL (ref 0.3–1.2)
Total Protein: 6 g/dL — ABNORMAL LOW (ref 6.5–8.1)

## 2022-05-06 LAB — GLUCOSE, CAPILLARY
Glucose-Capillary: 127 mg/dL — ABNORMAL HIGH (ref 70–99)
Glucose-Capillary: 123 mg/dL — ABNORMAL HIGH (ref 70–99)
Glucose-Capillary: 157 mg/dL — ABNORMAL HIGH (ref 70–99)
Glucose-Capillary: 150 mg/dL — ABNORMAL HIGH (ref 70–99)

## 2022-05-06 LAB — BRAIN NATRIURETIC PEPTIDE: B Natriuretic Peptide: 155.4 pg/mL — ABNORMAL HIGH (ref 0.0–100.0)

## 2022-05-06 LAB — MAGNESIUM: Magnesium: 2 mg/dL (ref 1.7–2.4)

## 2022-05-06 MED ORDER — ALUM & MAG HYDROXIDE-SIMETH 200-200-20 MG/5ML PO SUSP
15.0000 mL | Freq: Once | ORAL | Status: AC
Start: 2022-05-06 — End: 2022-05-06
  Administered 2022-05-06: 15 mL via ORAL
  Filled 2022-05-06: qty 30

## 2022-05-06 MED ORDER — ARFORMOTEROL TARTRATE 15 MCG/2ML IN NEBU
15.0000 ug | INHALATION_SOLUTION | Freq: Two times a day (BID) | RESPIRATORY_TRACT | Status: DC
  Administered 2022-05-06 – 2022-05-07 (×2): 15 ug via RESPIRATORY_TRACT
  Filled 2022-05-06 (×2): qty 2

## 2022-05-06 MED ORDER — BUDESONIDE 0.25 MG/2ML IN SUSP
0.2500 mg | Freq: Two times a day (BID) | RESPIRATORY_TRACT | Status: DC
Start: 2022-05-06 — End: 2022-05-07
  Administered 2022-05-06 – 2022-05-07 (×2): 0.25 mg via RESPIRATORY_TRACT
  Filled 2022-05-06 (×2): qty 2

## 2022-05-06 MED ORDER — POTASSIUM CHLORIDE CRYS ER 20 MEQ PO TBCR
40.0000 meq | EXTENDED_RELEASE_TABLET | Freq: Once | ORAL | Status: AC
  Administered 2022-05-06: 40 meq via ORAL
  Filled 2022-05-06: qty 2

## 2022-05-06 MED ORDER — UMECLIDINIUM-VILANTEROL 62.5-25 MCG/ACT IN AEPB
1.0000 | INHALATION_SPRAY | Freq: Every day | RESPIRATORY_TRACT | Status: DC
Start: 2022-05-06 — End: 2022-05-07
  Administered 2022-05-07: 1 via RESPIRATORY_TRACT
  Filled 2022-05-06: qty 14

## 2022-05-06 MED ORDER — METHYLPREDNISOLONE SODIUM SUCC 40 MG IJ SOLR
30.0000 mg | INTRAMUSCULAR | Status: DC
  Administered 2022-05-07: 30 mg via INTRAVENOUS
  Filled 2022-05-06: qty 1

## 2022-05-06 MED ORDER — FUROSEMIDE 40 MG PO TABS
40.0000 mg | ORAL_TABLET | Freq: Two times a day (BID) | ORAL | Status: DC
  Administered 2022-05-06 – 2022-05-07 (×3): 40 mg via ORAL
  Filled 2022-05-06 (×3): qty 1

## 2022-05-06 MED ORDER — IPRATROPIUM-ALBUTEROL 0.5-2.5 (3) MG/3ML IN SOLN
3.0000 mL | Freq: Three times a day (TID) | RESPIRATORY_TRACT | Status: DC
  Administered 2022-05-06 – 2022-05-07 (×4): 3 mL via RESPIRATORY_TRACT
  Filled 2022-05-06 (×4): qty 3

## 2022-05-06 MED ORDER — SALINE SPRAY 0.65 % NA SOLN
1.0000 | NASAL | Status: DC | PRN
  Administered 2022-05-06: 1 via NASAL
  Filled 2022-05-06: qty 44
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
## 2022-05-06 NOTE — TOC Initial Note (Signed)
Transition of Care [facility]) - Initial/Assessment Note    Patient Details  Name: Marcus Tran MRN: [id number] Date of Birth: 1951-09-11  Transition of [facility]) CM/SW Contact:    Leone Haven, RN Phone Number: 05/06/2022, 12:18 PM  Clinical Narrative:                 Patient is from home , daughter lives with him.  She takes him to his daughter apts. He states his PCP office sets up Zenaida Niece that can come and pick him up to take to doctors office.  He states he has no issues with medications so far.  He daughter will transport him home at dc.  He states he does not use oxygen at home.  He had 20 beats of vtach last pm, MD is making medication adjustments. He is for possible dc tomorrow.  NCM offered choice for HHPT, he states he does not need this,he walks every morning and moves around pretty good.  TOC following.   Expected Discharge Plan: Home w Home Health Services Barriers to Discharge: Continued Medical Work up   Patient Goals and CMS Choice Patient states their goals for this hospitalization and ongoing recovery are:: return home with daughter CMS Medicare.gov Compare Post Acute Care list provided to:: Patient Choice offered to / list presented to : Patient  Expected Discharge Plan and Services Expected Discharge Plan: Home w Home Health Services In-house Referral: NA Discharge Planning Services: CM Consult Post Acute Care Choice: Home Health Living arrangements for the past 2 months: Single Family Home                   DME Agency: NA       HH Arranged: PT, Refused HH          Prior Living Arrangements/Services Living arrangements for the past 2 months: Single Family Home Lives with:: Adult Children Patient language and need for interpreter reviewed:: Yes Do you feel safe going back to the place where you live?: Yes      Need for Family Participation in Patient Care: Yes (Comment) Care giver support system in place?: Yes (comment)   Criminal Activity/Legal  Involvement Pertinent to Current Situation/Hospitalization: No - Comment as needed  Activities of Daily Living Home Assistive Devices/Equipment: None ADL Screening (condition at time of admission) Patient's cognitive ability adequate to safely complete daily activities?: Yes Is the patient deaf or have difficulty hearing?: No Does the patient have difficulty seeing, even when wearing glasses/contacts?: No Does the patient have difficulty concentrating, remembering, or making decisions?: No Patient able to express need for assistance with ADLs?: Yes Does the patient have difficulty dressing or bathing?: No Independently performs ADLs?: Yes (appropriate for developmental age) Does the patient have difficulty walking or climbing stairs?: No Weakness of Legs: None Weakness of Arms/Hands: None  Permission Sought/Granted                  Emotional Assessment Appearance:: Appears stated age Attitude/Demeanor/Rapport: Engaged Affect (typically observed): Appropriate Orientation: : Oriented to Self, Oriented to Place, Oriented to  Time, Oriented to Situation Alcohol / Substance Use: Not Applicable Psych Involvement: No (comment)  Admission diagnosis:  Respiratory distress [R06.03] Acute respiratory failure with hypoxia (HCC) [J96.01] Patient Active Problem List   Diagnosis Date Noted   Acute respiratory failure with hypoxia (HCC) 05/04/2022   Third degree heart block (HCC) 04/15/2022   Snoring 04/15/2022   Nonischemic cardiomyopathy (HCC) 04/15/2022   HFrEF (heart failure with reduced  ejection fraction) (HCC) 04/15/2022   COPD with acute exacerbation (HCC) 03/22/2022   Acute on chronic combined systolic and diastolic CHF (congestive heart failure) (HCC)    Tobacco abuse 03/08/2022   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 [place] [address] Phone: [phone] Fax: [phone]  [facility] [address] [place] [address] Phone: [phone] Fax: [phone]  [id number] - [place], [place] - [address] AT [facility] [address] [place] [address] Phone: [phone] Fax: [phone]     Social Determinants of Health (SDOH) Interventions Food Insecurity Interventions: Intervention Not Indicated Housing Interventions: Intervention Not Indicated Transportation Interventions: Intervention Not Indicated  Readmission Risk Interventions    05/06/2022   12:16 PM  Readmission Risk Prevention Plan  Transportation Screening Complete  PCP or Specialist Appt within 3-5 Days Complete  HRI or Home Care Consult Complete  Social Work Consult for Recovery Care Planning/Counseling Complete  Palliative Care Screening Not Applicable  Medication Review Oceanographer) Complete

## 2022-05-06 NOTE — Progress Notes (Signed)
Mobility Specialist Progress Note    05/06/22 1100  Mobility  Activity Ambulated independently in hallway  Level of Assistance Standby assist, set-up cues, supervision of patient - no hands on  Assistive Device None  Distance Ambulated (ft) 450 ft  Activity Response Tolerated well  $Mobility charge 1 Mobility   Pre-Mobility: 78 HR, 112/77 BP, 92% on RA SpO2 During Mobility: 88% SpO2 Post-Mobility: 94 HR, 145/95 BP, 90% on 2LO2 SpO2  Pt received in bed and agreeable. No complaints on walk. Attempted to wean pt off O2, SpO2 88-90% on 1LO2. Returned to chair with call bell in reach and RN present. Left on 2LO2. RN notified.   [place] Mobility Specialist

## 2022-05-06 NOTE — Inpatient Diabetes Management (Signed)
Inpatient Diabetes Program Recommendations  AACE/ADA: New Consensus Statement on Inpatient Glycemic Control (2015)  Target Ranges:  Prepandial:   less than 140 mg/dL      Peak postprandial:   less than 180 mg/dL (1-2 hours)      Critically ill patients:  140 - 180 mg/dL   Lab Results  Component Value Date   GLUCAP 123 (H) 05/06/2022   HGBA1C 6.9 (H) 03/07/2022    Review of Glycemic Control  Diabetes history: New Diabetes Diagnosis Outpatient Diabetes medications: Farxiga 10 mg Daily Current orders for Inpatient glycemic control:  Farxiga 10 mg Daily Novolog 0-9 units tid + hs Solumedrol 30 mg Q24 hours  Spoke with pt at bedside regarding A1c level. Discussed glucose and A1c goals. Discussed lifestyle modifications including diet and physical activity. Pt states he walks every morning. Pt also reports liking to eat fruit at home. Discussed portion sizes ad carbohydrate options at home. Will attach information to AVS.   Thanks,  Christena Deem RN, MSN, BC-ADM Inpatient Diabetes Coordinator Team Pager [phone] (8a-5p)

## 2022-05-06 NOTE — Progress Notes (Signed)
Progress Note  Patient Name: Marcus Tran Date of Encounter: 05/06/2022  [facility] Cardiologist: Rollene Rotunda, MD   Subjective   20 beats of of nonsustained ventricular tachycardia noted last evening.  Denied any symptoms. Currently laying in bed getting breathing treatment. Inpatient Medications    Scheduled Meds:  aspirin EC  81 mg Oral Daily   atorvastatin  80 mg Oral QPM   azithromycin  250 mg Oral Daily   carvedilol  6.25 mg Oral BID WC   dapagliflozin propanediol  10 mg Oral Daily   enoxaparin (LOVENOX) injection  40 mg Subcutaneous Q24H   furosemide  60 mg Intravenous Daily   insulin aspart  0-5 Units Subcutaneous QHS   insulin aspart  0-9 Units Subcutaneous TID WC   ipratropium-albuterol  3 mL Nebulization TID   isosorbide-hydrALAZINE  1 tablet Oral TID   methylPREDNISolone (SOLU-MEDROL) injection  60 mg Intravenous Q24H   sacubitril-valsartan  1 tablet Oral BID   Continuous Infusions:  PRN Meds: acetaminophen **OR** acetaminophen, albuterol, guaiFENesin-dextromethorphan, hydrALAZINE, nitroGLYCERIN, polyethylene glycol, sodium chloride   Vital Signs    Vitals:   05/05/22 2349 05/06/22 0252 05/06/22 0538 05/06/22 0742  BP: 111/68 129/82 134/82 118/86  Pulse: 87 82 78 89  Resp: 18 18 18  (!) 21  Temp: 98 F (36.7 C) 98.4 F (36.9 C) 97.7 F (36.5 C) 98.5 F (36.9 C)  TempSrc: Oral Oral Oral Oral  SpO2: 95% 95% 92% 93%  Weight:  69.1 kg 69.1 kg   Height:        Intake/Output Summary (Last 24 hours) at 05/06/2022 0852 Last data filed at 05/06/2022 0600 Gross per 24 hour  Intake 775.2 ml  Output 1150 ml  Net -374.8 ml      05/06/2022    5:38 AM 05/06/2022    2:52 AM 05/04/2022   10:58 PM  Last 3 Weights  Weight (lbs) 152 lb 6.3 oz 152 lb 6.4 oz 154 lb 5.2 oz  Weight (kg) 69.126 kg 69.128 kg 70 kg      Telemetry    As below nonsustained VT- Personally Reviewed  ECG    No new, sinus rhythm- Personally Reviewed  Physical Exam   GEN: No  acute distress.   Neck: No JVD Cardiac: RRR, no murmurs, rubs, or gallops.  Respiratory: Clear to auscultation bilaterally.  Less wheeze noted GI: Soft, nontender, non-distended  MS: No edema; No deformity. Neuro:  Nonfocal  Psych: Normal affect   Labs    High Sensitivity Troponin:   Recent Labs  Lab 05/04/22 2151  TROPONINIHS 30*     Chemistry Recent Labs  Lab 05/04/22 2151 05/04/22 2219 05/05/22 0123 05/06/22 0330  NA 139 138 137 131*  K 4.2 4.0 4.0 3.5  CL 103  --  101 99  CO2 27  --  22 23  GLUCOSE 119*  --  135* 139*  BUN 18  --  20 27*  CREATININE 1.41*  --  1.56* 1.43*  CALCIUM 9.1  --  9.1 8.3*  MG 2.2  --   --  2.0  PROT 6.8  --  7.1 6.0*  ALBUMIN 4.0  --  4.1 3.3*  AST 34  --  35 21  ALT 38  --  36 29  ALKPHOS 63  --  62 48  BILITOT 1.2  --  0.5 1.0  GFRNONAA 53*  --  47* 52*  ANIONGAP 9  --  14 9    Lipids No results  for input(s): "CHOL", "TRIG", "HDL", "LABVLDL", "LDLCALC", "CHOLHDL" in the last 168 hours.  Hematology Recent Labs  Lab 05/04/22 2151 05/04/22 2219 05/05/22 0123 05/06/22 0330  WBC 4.9  --  5.3 12.3*  RBC 5.63  --  5.70 5.21  HGB 16.5 17.7* 16.7 15.4  HCT 51.5 52.0 52.2* 46.3  MCV 91.5  --  91.6 88.9  MCH 29.3  --  29.3 29.6  MCHC 32.0  --  32.0 33.3  RDW 14.2  --  14.2 14.0  PLT 174  --  173 164   Thyroid No results for input(s): "TSH", "FREET4" in the last 168 hours.  BNP Recent Labs  Lab 05/04/22 2151 05/06/22 0330  BNP 247.8* 155.4*    DDimer No results for input(s): "DDIMER" in the last 168 hours.   Radiology    DG Chest Port 1 View  Result Date: 05/05/2022 CLINICAL DATA:  Shortness of breath and respiratory distress EXAM: PORTABLE CHEST 1 VIEW COMPARISON:  Yesterday FINDINGS: Increased heart size in the setting of lower lung volumes. Stable mediastinal contours. Interstitial coarsening and vascular prominence in the setting of low lung volumes. There is no [employer], consolidation, effusion, or pneumothorax.  Artifact from EKG leads. IMPRESSION: Limited low volume chest.  No convincing change from yesterday. Electronically Signed   By: Jorje Guild M.D.   On: 05/05/2022 07:18   DG Chest Port 1 View  Result Date: 05/04/2022 CLINICAL DATA:  Dyspnea. EXAM: PORTABLE CHEST 1 VIEW COMPARISON:  Chest radiograph dated 04/22/2022. FINDINGS: Mild chronic interstitial coarsening. No focal consolidation, pleural effusion or pneumothorax. The left costophrenic angle has been excluded from the image. The cardiac silhouette is within limits. Prominence of the central pulmonary arteries bilaterally suggestive of pulmonary hypertension. No acute osseous pathology. IMPRESSION: No active disease. Electronically Signed   By: Anner Crete M.D.   On: 05/04/2022 22:18    Cardiac Studies   Echo EF 35%, pulmonary pressures 54 mmHg  Patient Profile     71 y.o. male here with acute respiratory failure/acute systolic heart failure, COPD, nonsustained ventricular tachycardia  Assessment & Plan    Acute on chronic respiratory failure, chronic systolic heart failure, COPD, chronic lung disease, nonischemic cardiomyopathy - BNP mildly elevated 247 on admission down from 1200.  Significant wheezing heard on exam on admission.  Weight was 154 up from 151 suspected dry weight. -We will go ahead and place on oral Lasix -Continuing with carvedilol 6.25, Farxiga 10, BiDil 20/37.5 3 times daily, Entresto 90/103  Elevated troponin - Acute myocardial injury in the setting of systolic heart failure/respiratory failure.  Not acute coronary syndrome.  Coronary artery disease - 50% stenosis in obtuse marginal 1 otherwise no significant CAD.  Continue with goal-directed medical therapy which includes statin aspirin carvedilol.  Nonsustained ventricular tachycardia/PVCs - Noted on telemetry telemetry reviewed and interpreted.  20 beat run last night.  Continuing with carvedilol, most recent blood pressure 118/86.  We will continue  with current dosing.  Nonsustained ventricular tachycardia commonly seen in the setting of reduced ejection fraction, nonischemic cardiomyopathy.  Secondary pulmonary hypertension - Elevated pulmonary pressures on echocardiogram as a result of underlying left heart disease as well as lung disease.  Continue with current goal-directed medical therapy.  Chronic kidney disease stage IIIa-creatinine 1.43.  Continuing with current medications as above.  Avoid nephrotoxic agents.        For questions or updates, please contact [place] Please consult [email or web address] for contact info under  Signed, Donato Schultz, MD  05/06/2022, 8:52 AM

## 2022-05-06 NOTE — Plan of Care (Signed)
  Problem: Education: Goal: Ability to demonstrate management of disease process will improve Outcome: Progressing   Problem: Education: Goal: Knowledge of disease or condition will improve Outcome: Progressing   Problem: Metabolic: Goal: Ability to maintain appropriate glucose levels will improve Outcome: Progressing   Problem: Activity: Goal: Risk for activity intolerance will decrease Outcome: Progressing

## 2022-05-06 NOTE — Progress Notes (Signed)
PROGRESS NOTE                                                                                                                                                                                                             Patient Demographics:    Marcus Tran, is a 71 y.o. male, DOB - 05/26/1951, VFI:[id number]  Outpatient Primary MD for the patient is Hillery Aldo, NP    LOS - 1  Admit date - 05/04/2022    Chief Complaint  Patient presents with   Respiratory Distress       Brief Narrative (HPI from H&P)   71 y.o. male with medical history significant of bilateral hilar adenopathy, hypertension, CKD 3A, CAD, systolic and diastolic heart failure, third-degree heart block, hyperlipidemia, elevated A1c, chronic lung disease without diagnosis presenting with worsening shortness of breath, was diagnosed with acute hypoxic respiratory failure due to CHF and COPD exacerbation, was also found to have extremely high blood pressure placed on nitro drip, initially required BiPAP and subsequently on 5 L nasal cannula oxygen, admitted for further care.   Subjective:   Patient in bed, appears comfortable, denies any headache, no fever, no chest pain or pressure, much improved shortness of breath , no abdominal pain. No new focal weakness.   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  Currently placed on combination of Coreg, Entresto, Farxiga and BiDil along with diuretic, continue supplemental oxygen, add flutter valve and I-S for pulmonary toiletry.  Overall much  improved on 05/06/2022 transition to oral diuretic, will try to titrate down oxygen.  Appreciate cardiology input.   Possible COPD exacerbation.  Counseled to quit smoking, nebulizer treatments, IV Solu-Medrol with dose tapering down, add azithromycin for atypical coverage and oxygen needed, pulmonary toiletry as above.  Clinically much improved wheezing has resolved.  Essential hypertension.  In poor control medications adjusted as in #1 above.  Monitor.  CKD 3A.  Baseline creatinine around 1.5.  Monitor.  CAD .  Continue aspirin, statin, Coreg and BiDil combination for secondary prevention, no chest pain or acute issues.  Troponin trend is flat and in non-ACS pattern, likely due to demand ischemia from #1 above.  Dyslipidemia.  On statin.  Ongoing smoking.  Counseled to quit.  DM type II.  A1c 6.9, ISS.  DM education.  Lab Results  Component Value Date   HGBA1C 6.9 (H) 03/07/2022    CBG (last 3)  Recent Labs    05/05/22 2117 05/06/22 0609 05/06/22 1059  GLUCAP 149* 127* 123*         Condition - Extremely Guarded  Family Communication  :  none present  Code Status :  Full  Consults  : Cardiology  PUD Prophylaxis :    Procedures  :     Echo -       Disposition Plan  :    Status is: Observation  DVT Prophylaxis  :    enoxaparin (LOVENOX) injection 40 mg Start: 05/05/22 0800    Lab Results  Component Value Date   PLT 164 05/06/2022    Diet :  Diet Order             DIET SOFT Room service appropriate? Yes; Fluid consistency: Thin; Fluid restriction: 1200 mL Fluid  Diet effective now                    Inpatient Medications  Scheduled Meds:  arformoterol  15 mcg Nebulization BID   aspirin EC  81 mg Oral Daily   atorvastatin  80 mg Oral QPM   azithromycin  250 mg Oral Daily   budesonide (PULMICORT) nebulizer solution  0.25 mg Nebulization BID   carvedilol  6.25 mg Oral BID WC   dapagliflozin propanediol  10 mg Oral Daily   enoxaparin  (LOVENOX) injection  40 mg Subcutaneous Q24H   furosemide  40 mg Oral BID   insulin aspart  0-5 Units Subcutaneous QHS   insulin aspart  0-9 Units Subcutaneous TID WC   ipratropium-albuterol  3 mL Nebulization TID   isosorbide-hydrALAZINE  1 tablet Oral TID   methylPREDNISolone (SOLU-MEDROL) injection  60 mg Intravenous Q24H   sacubitril-valsartan  1 tablet Oral BID   umeclidinium-vilanterol  1 puff Inhalation Daily   Continuous Infusions: PRN Meds:.acetaminophen **OR** acetaminophen, albuterol, guaiFENesin-dextromethorphan, hydrALAZINE, nitroGLYCERIN, polyethylene glycol, sodium chloride  Antibiotics  :    Anti-infectives (From admission, onward)    Start     Dose/Rate Route Frequency Ordered Stop   05/05/22 0930  azithromycin (ZITHROMAX) tablet 250 mg        250 mg Oral Daily 05/05/22 0837 05/09/22 0959        Time Spent in minutes  30   Susa Raring M.D on 05/06/2022 at 11:59 AM  To page go to [email or web address]   [facility] -  Office  [phone]  See all Orders from today for further details    Objective:   Vitals:   05/06/22 0538 05/06/22 0742 05/06/22 0900 05/06/22 1100  BP: 134/82 118/86  92/64  Pulse: 78 89 82 79  Resp: 18 (!) 21 15 19   Temp: 97.7 F (36.5 C) 98.5 F (36.9 C)  98 F (36.7 C)  TempSrc: Oral Oral  Oral  SpO2: 92% 93% 99% 96%  Weight: 69.1 kg     Height:        Wt Readings from Last 3 Encounters:  05/06/22 69.1 kg  04/16/22 70.4 kg  04/16/22 70.6 kg  Intake/Output Summary (Last 24 hours) at 05/06/2022 1159 Last data filed at 05/06/2022 0934 Gross per 24 hour  Intake 775.2 ml  Output 1725 ml  Net -949.8 ml     Physical Exam  Awake Alert, No new F.N deficits, Normal affect [place].AT,PERRAL Supple Neck, No JVD,   Symmetrical Chest wall movement, Good air movement bilaterally, CTAB RRR,No Gallops, Rubs or new Murmurs,  +ve B.Sounds, Abd Soft, No tenderness,   No Cyanosis, Clubbing or edema        Data Review:     CBC Recent Labs  Lab 05/04/22 2151 05/04/22 2219 05/05/22 0123 05/06/22 0330  WBC 4.9  --  5.3 12.3*  HGB 16.5 17.7* 16.7 15.4  HCT 51.5 52.0 52.2* 46.3  PLT 174  --  173 164  MCV 91.5  --  91.6 88.9  MCH 29.3  --  29.3 29.6  MCHC 32.0  --  32.0 33.3  RDW 14.2  --  14.2 14.0  LYMPHSABS 1.4  --   --  1.0  MONOABS 0.5  --   --  1.2*  EOSABS 0.6*  --   --  0.1  BASOSABS 0.1  --   --  0.0    Electrolytes Recent Labs  Lab 05/04/22 2151 05/04/22 2219 05/05/22 0123 05/06/22 0330  NA 139 138 137 131*  K 4.2 4.0 4.0 3.5  CL 103  --  101 99  CO2 27  --  22 23  GLUCOSE 119*  --  135* 139*  BUN 18  --  20 27*  CREATININE 1.41*  --  1.56* 1.43*  CALCIUM 9.1  --  9.1 8.3*  AST 34  --  35 21  ALT 38  --  36 29  ALKPHOS 63  --  62 48  BILITOT 1.2  --  0.5 1.0  ALBUMIN 4.0  --  4.1 3.3*  MG 2.2  --   --  2.0  BNP 247.8*  --   --  155.4*    ------------------------------------------------------------------------------------------------------------------ No results for input(s): "CHOL", "HDL", "LDLCALC", "TRIG", "CHOLHDL", "LDLDIRECT" in the last 72 hours.  Lab Results  Component Value Date   HGBA1C 6.9 (H) 03/07/2022    No results for input(s): "TSH", "T4TOTAL", "T3FREE", "THYROIDAB" in the last 72 hours.  Invalid input(s): "FREET3" ------------------------------------------------------------------------------------------------------------------ ID Labs Recent Labs  Lab 05/04/22 2151 05/05/22 0123 05/06/22 0330  WBC 4.9 5.3 12.3*  PLT 174 173 164  CREATININE 1.41* 1.56* 1.43*    Radiology Reports DG Chest Port 1 View  Result Date: 05/05/2022 CLINICAL DATA:  Shortness of breath and respiratory distress EXAM: PORTABLE CHEST 1 VIEW COMPARISON:  Yesterday FINDINGS: Increased heart size in the setting of lower lung volumes. Stable mediastinal contours. Interstitial coarsening and vascular prominence in the setting of low lung volumes. There is no [employer],  consolidation, effusion, or pneumothorax. Artifact from EKG leads. IMPRESSION: Limited low volume chest.  No convincing change from yesterday. Electronically Signed   By: Tiburcio Pea M.D.   On: 05/05/2022 07:18   DG Chest Port 1 View  Result Date: 05/04/2022 CLINICAL DATA:  Dyspnea. EXAM: PORTABLE CHEST 1 VIEW COMPARISON:  Chest radiograph dated 04/22/2022. FINDINGS: Mild chronic interstitial coarsening. No focal consolidation, pleural effusion or pneumothorax. The left costophrenic angle has been excluded from the image. The cardiac silhouette is within limits. Prominence of the central pulmonary arteries bilaterally suggestive of pulmonary hypertension. No acute osseous pathology. IMPRESSION: No active disease. Electronically Signed   By: Elgie Collard  M.D.   On: 05/04/2022 22:18

## 2022-05-06 NOTE — Progress Notes (Signed)
Patient has no BIPAP unit in room. Order is for PRN. Patient is doing well on 1 lpm at this time. Told pt to call if he got into distress.

## 2022-05-07 ENCOUNTER — Other Ambulatory Visit (HOSPITAL_COMMUNITY): Payer: Self-pay

## 2022-05-07 DIAGNOSIS — R0603 Acute respiratory distress: Secondary | ICD-10-CM | POA: Diagnosis not present

## 2022-05-07 LAB — COMPREHENSIVE METABOLIC PANEL
ALT: 27 U/L (ref 0–44)
AST: 19 U/L (ref 15–41)
Albumin: 3.2 g/dL — ABNORMAL LOW (ref 3.5–5.0)
Alkaline Phosphatase: 46 U/L (ref 38–126)
Anion gap: 8 (ref 5–15)
BUN: 24 mg/dL — ABNORMAL HIGH (ref 8–23)
CO2: 27 mmol/L (ref 22–32)
Calcium: 8.3 mg/dL — ABNORMAL LOW (ref 8.9–10.3)
Chloride: 98 mmol/L (ref 98–111)
Creatinine, Ser: 1.41 mg/dL — ABNORMAL HIGH (ref 0.61–1.24)
GFR, Estimated: 53 mL/min — ABNORMAL LOW (ref >60)
Glucose, Bld: 203 mg/dL — ABNORMAL HIGH (ref 70–99)
Potassium: 3.8 mmol/L (ref 3.5–5.1)
Sodium: 133 mmol/L — ABNORMAL LOW (ref 135–145)
Total Bilirubin: 0.6 mg/dL (ref 0.3–1.2)
Total Protein: 5.9 g/dL — ABNORMAL LOW (ref 6.5–8.1)

## 2022-05-07 LAB — BRAIN NATRIURETIC PEPTIDE: B Natriuretic Peptide: 225.2 pg/mL — ABNORMAL HIGH (ref 0.0–100.0)

## 2022-05-07 LAB — GLUCOSE, CAPILLARY: Glucose-Capillary: 110 mg/dL — ABNORMAL HIGH (ref 70–99)

## 2022-05-07 LAB — CBC WITH DIFFERENTIAL/PLATELET
Abs Immature Granulocytes: 0.06 10*3/uL (ref 0.00–0.07)
Basophils Absolute: 0 10*3/uL (ref 0.0–0.1)
Basophils Relative: 0 %
Eosinophils Absolute: 0 10*3/uL (ref 0.0–0.5)
Eosinophils Relative: 0 %
HCT: 46.1 % (ref 39.0–52.0)
Hemoglobin: 15.6 g/dL (ref 13.0–17.0)
Immature Granulocytes: 1 %
Lymphocytes Relative: 8 %
Lymphs Abs: 0.9 10*3/uL (ref 0.7–4.0)
MCH: 29.5 pg (ref 26.0–34.0)
MCHC: 33.8 g/dL (ref 30.0–36.0)
MCV: 87.3 fL (ref 80.0–100.0)
Monocytes Absolute: 0.7 10*3/uL (ref 0.1–1.0)
Monocytes Relative: 6 %
Neutro Abs: 8.8 10*3/uL — ABNORMAL HIGH (ref 1.7–7.7)
Neutrophils Relative %: 85 %
Platelets: 165 10*3/uL (ref 150–400)
RBC: 5.28 MIL/uL (ref 4.22–5.81)
RDW: 14.2 % (ref 11.5–15.5)
WBC: 10.4 10*3/uL (ref 4.0–10.5)
nRBC: 0 % (ref 0.0–0.2)

## 2022-05-07 LAB — MAGNESIUM: Magnesium: 2.2 mg/dL (ref 1.7–2.4)

## 2022-05-07 MED ORDER — METFORMIN HCL 500 MG PO TABS
500.0000 mg | ORAL_TABLET | Freq: Two times a day (BID) | ORAL | Status: DC
  Administered 2022-05-07: 500 mg via ORAL
  Filled 2022-05-07: qty 1

## 2022-05-07 MED ORDER — CARVEDILOL 6.25 MG PO TABS
6.2500 mg | ORAL_TABLET | Freq: Two times a day (BID) | ORAL | 0 refills | Status: DC
  Filled 2022-05-07: qty 60, 30d supply, fill #0

## 2022-05-07 MED ORDER — POTASSIUM CHLORIDE CRYS ER 20 MEQ PO TBCR
20.0000 meq | EXTENDED_RELEASE_TABLET | Freq: Every day | ORAL | 0 refills | Status: DC
  Filled 2022-05-07: qty 30, 30d supply, fill #0

## 2022-05-07 MED ORDER — METFORMIN HCL 500 MG PO TABS
500.0000 mg | ORAL_TABLET | Freq: Two times a day (BID) | ORAL | 0 refills | Status: AC
  Filled 2022-05-07: qty 60, 30d supply, fill #0

## 2022-05-07 MED ORDER — FUROSEMIDE 40 MG PO TABS
40.0000 mg | ORAL_TABLET | Freq: Two times a day (BID) | ORAL | 0 refills | Status: DC
  Filled 2022-05-07: qty 60, 30d supply, fill #0

## 2022-05-07 NOTE — Progress Notes (Signed)
SATURATION QUALIFICATIONS: (This note is used to comply with regulatory documentation for home oxygen)  Patient Saturations on Room Air at Rest = 974%  Patient Saturations on Room Air while Ambulating = 89% or greater  Patient Saturations on n/a Liters of oxygen while Ambulating = n/a%  Please briefly explain why patient needs home oxygen: Patient ambulated 450 feet on room air. O2 sats at rest 93% or greater. O2 sats while ambulating 89% or greater. Patient did drop to 89% but quickly came up to 90% or greater. HR did increase to 133 towards end of walk. HR quickly came back down to 103 while ambulating. Patient stated he felt good while walking, no shortness of breath or chest tightness. Patient sitting edge of bed. O2 sats 95% on room air. HR 84. Call bell within reach.

## 2022-05-07 NOTE — Progress Notes (Signed)
Patient given discharge instructions and stated understanding. 

## 2022-05-07 NOTE — TOC Transition Note (Signed)
Transition of Care [facility]) - CM/SW Discharge Note   Patient Details  Name: Marcus Tran MRN: [id number] Date of Birth: [birth year]  Transition [facility] CM/SW Contact:  Leone Haven, RN Phone Number: 05/07/2022, 9:54 AM   Clinical Narrative:    Patient is for dc today, TOC to do his meds, he does not want HHPT.  He has transport at dc.     Final next level of care: Home/Self Care Barriers to Discharge: Continued Medical Work up   Patient Goals and CMS Choice Patient states their goals for this hospitalization and ongoing recovery are:: return home with daughter CMS Medicare.gov Compare Post Acute Care list provided to:: Patient Choice offered to / list presented to : Patient  Discharge Placement                       Discharge Plan and Services In-house Referral: NA Discharge Planning Services: CM Consult Post Acute Care Choice: Home Health            DME Agency: NA       HH Arranged: PT, Refused HH          Social Determinants of Health (SDOH) Interventions Food Insecurity Interventions: Intervention Not Indicated Housing Interventions: Intervention Not Indicated Transportation Interventions: Intervention Not Indicated   Readmission Risk Interventions    05/06/2022   12:16 PM  Readmission Risk Prevention Plan  Transportation Screening Complete  PCP or Specialist Appt within 3-5 Days Complete  HRI or Home Care Consult Complete  Social Work Consult for Recovery Care Planning/Counseling Complete  Palliative Care Screening Not Applicable  Medication Review [employer]) Complete

## 2022-05-07 NOTE — Discharge Instructions (Signed)
Follow with Primary MD Hillery Aldo, NP in 7 days   Get CBC, CMP, Magnesium, 2 view Chest X ray -  checked next visit within 1 week by Primary MD    Activity: As tolerated with Full fall precautions use walker/cane & assistance as needed  Disposition Home   Diet: Heart Healthy Check your Weight same time everyday, if you gain over 2 pounds, or you develop in leg swelling, experience more shortness of breath or chest pain, call your Primary MD immediately. Follow Cardiac Low Salt Diet and 1.5 lit/day fluid restriction.  Special Instructions: If you have smoked or chewed Tobacco  in the last 2 yrs please stop smoking, stop any regular Alcohol  and or any Recreational drug use.  On your next visit with your primary care physician please Get Medicines reviewed and adjusted.  Please request your Prim.MD to go over all Hospital Tests and Procedure/Radiological results at the follow up, please get all Hospital records sent to your Prim MD by signing hospital release before you go home.  If you experience worsening of your admission symptoms, develop shortness of breath, life threatening emergency, suicidal or homicidal thoughts you must seek medical attention immediately by calling 911 or calling your MD immediately  if symptoms less severe.  You Must read complete instructions/literature along with all the possible adverse reactions/side effects for all the Medicines you take and that have been prescribed to you. Take any new Medicines after you have completely understood and accpet all the possible adverse reactions/side effects.
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
## 2022-05-07 NOTE — Plan of Care (Signed)
Problem: Education: Goal: Ability to demonstrate management of disease process will improve Outcome: Adequate for Discharge Goal: Ability to verbalize understanding of medication therapies will improve Outcome: Adequate for Discharge Goal: Individualized Educational Video(s) Outcome: Adequate for Discharge   Problem: Activity: Goal: Capacity to carry out activities will improve Outcome: Adequate for Discharge   Problem: Cardiac: Goal: Ability to achieve and maintain adequate cardiopulmonary perfusion will improve Outcome: Adequate for Discharge   Problem: Education: Goal: Knowledge of disease or condition will improve Outcome: Adequate for Discharge Goal: Knowledge of the prescribed therapeutic regimen will improve Outcome: Adequate for Discharge Goal: Individualized Educational Video(s) Outcome: Adequate for Discharge   Problem: Activity: Goal: Ability to tolerate increased activity will improve Outcome: Adequate for Discharge Goal: Will verbalize the importance of balancing activity with adequate rest periods Outcome: Adequate for Discharge   Problem: Respiratory: Goal: Ability to maintain a clear airway will improve Outcome: Adequate for Discharge Goal: Levels of oxygenation will improve Outcome: Adequate for Discharge Goal: Ability to maintain adequate ventilation will improve Outcome: Adequate for Discharge   Problem: Education: Goal: Ability to describe self-care measures that may prevent or decrease complications (Diabetes Survival Skills Education) will improve Outcome: Adequate for Discharge Goal: Individualized Educational Video(s) Outcome: Adequate for Discharge   Problem: Coping: Goal: Ability to adjust to condition or change in health will improve Outcome: Adequate for Discharge   Problem: Fluid Volume: Goal: Ability to maintain a balanced intake and output will improve Outcome: Adequate for Discharge   Problem: Health Behavior/Discharge  Planning: Goal: Ability to identify and utilize available resources and services will improve Outcome: Adequate for Discharge Goal: Ability to manage health-related needs will improve Outcome: Adequate for Discharge   Problem: Metabolic: Goal: Ability to maintain appropriate glucose levels will improve Outcome: Adequate for Discharge   Problem: Nutritional: Goal: Maintenance of adequate nutrition will improve Outcome: Adequate for Discharge Goal: Progress toward achieving an optimal weight will improve Outcome: Adequate for Discharge   Problem: Skin Integrity: Goal: Risk for impaired skin integrity will decrease Outcome: Adequate for Discharge   Problem: Tissue Perfusion: Goal: Adequacy of tissue perfusion will improve Outcome: Adequate for Discharge   Problem: Education: Goal: Knowledge of General Education information will improve Description: Including pain rating scale, medication(s)/side effects and non-pharmacologic comfort measures Outcome: Adequate for Discharge   Problem: Health Behavior/Discharge Planning: Goal: Ability to manage health-related needs will improve Outcome: Adequate for Discharge   Problem: Clinical Measurements: Goal: Ability to maintain clinical measurements within normal limits will improve Outcome: Adequate for Discharge Goal: Will remain free from infection Outcome: Adequate for Discharge Goal: Diagnostic test results will improve Outcome: Adequate for Discharge Goal: Respiratory complications will improve Outcome: Adequate for Discharge Goal: Cardiovascular complication will be avoided Outcome: Adequate for Discharge   Problem: Activity: Goal: Risk for activity intolerance will decrease Outcome: Adequate for Discharge   Problem: Nutrition: Goal: Adequate nutrition will be maintained Outcome: Adequate for Discharge   Problem: Coping: Goal: Level of anxiety will decrease Outcome: Adequate for Discharge   Problem:  Elimination: Goal: Will not experience complications related to bowel motility Outcome: Adequate for Discharge Goal: Will not experience complications related to urinary retention Outcome: Adequate for Discharge   Problem: Pain Managment: Goal: General experience of comfort will improve Outcome: Adequate for Discharge   Problem: Safety: Goal: Ability to remain free from injury will improve Outcome: Adequate for Discharge   Problem: Skin Integrity: Goal: Risk for impaired skin integrity will decrease Outcome: Adequate for Discharge   Problem: Acute  Rehab OT Goals (only OT should resolve) Goal: Pt. Will Perform Lower Body Dressing Outcome: Adequate for Discharge Goal: Pt. Will Transfer To Toilet Outcome: Adequate for Discharge Goal: OT Additional ADL Goal #1 Outcome: Adequate for Discharge   Problem: Acute Rehab PT Goals(only PT should resolve) Goal: Patient Will Transfer Sit To/From Stand Outcome: Adequate for Discharge Goal: Pt Will Transfer Bed To Chair/Chair To Bed Outcome: Adequate for Discharge Goal: Pt Will Perform Standing Balance Or Pre-Gait Outcome: Adequate for Discharge Goal: Pt Will Ambulate Outcome: Adequate for Discharge Goal: Pt Will Go Up/Down Stairs Outcome: Adequate for Discharge

## 2022-05-09 NOTE — Progress Notes (Unsigned)
Cardiology Clinic Note   Patient Name: Marcus Tran Date of Encounter: 05/12/2022  Primary Care Provider:  Hillery Aldo, NP Primary Cardiologist:  Rollene Rotunda, MD  Patient Profile    Marcus Tran 71 year old male presents to the clinic today for an evaluation of his systolic CHF.  Past Medical History    Past Medical History:  Diagnosis Date   Acute metabolic encephalopathy 03/08/2022   Acute respiratory failure with hypoxia (HCC) 03/20/2022   Acute respiratory failure with hypoxia and hypercapnia (HCC) 03/06/2022   CHF (congestive heart failure) (HCC)    Dyslipidemia    Hypertension    Past Surgical History:  Procedure Laterality Date   LEG SURGERY     Right-sided as result of trauma   RIGHT/LEFT HEART CATH AND CORONARY ANGIOGRAPHY N/A 03/09/2022   Procedure: RIGHT/LEFT HEART CATH AND CORONARY ANGIOGRAPHY;  Surgeon: Runell Gess, MD;  Location: [facility];  Service: Cardiovascular;  Laterality: N/A;   Umbilicus surgery     As a child    Allergies  No Known Allergies  History of Present Illness    Marcus Tran has a PMH of chronic respiratory failure, chronic systolic CHF, COPD, coronary artery disease, NSVT, PVCs, pulmonary hypertension, and CKD stage IIIa.  He was admitted to the hospital on 05/05/2022 and discharged on 05/07/2022 with acute respiratory failure and acute systolic CHF.  His BNP was mildly elevated at 247 which is down from 1200.  He was noted to have significant wheezing on admission.  His weight was up to 154 from 151 pounds.  He received oral furosemide.  His Entresto, carvedilol, Marcelline Deist and BiDil were continued.  He was noted to have elevated troponins which were related to his systolic heart failure and respiratory failure.  On telemetry he was noted to have a 20 beat run of NSVT.  His echocardiogram showed elevated pulmonary pressures which were felt to be a result of his left heart disease and lung disease.  He presents to the clinic  today for follow-up evaluation states he feels fairly well today.  He continues to have a cough which has been slowly improving.  He reports that he has been taking cough medicine at night which helps with his sleeping.  He has been weighing self daily.  We reviewed his recent hospitalization and he expressed understanding.  I advised on contacting the office with a weight increase of 2-3 pounds overnight or 5 pounds in a week.  I will continue his current medication regimen, given salty 6 diet sheet, have him increase his physical activity as tolerated and plan follow-up in  3 to 4 months.  Today he denies chest pain, shortness of breath, lower extremity edema, fatigue, palpitations, melena, hematuria, hemoptysis, diaphoresis, weakness, presyncope, syncope, orthopnea, and PND.    Home Medications    Prior to Admission medications   Medication Sig Start Date End Date Taking? Authorizing Provider  albuterol (VENTOLIN HFA) 108 (90 Base) MCG/ACT inhaler Inhale 2 puffs into the lungs every 4 (four) hours as needed for wheezing or shortness of breath. 04/06/22   Hunsucker, Lesia Sago, MD  aspirin EC 81 MG tablet Take 1 tablet (81 mg total) by mouth daily. Swallow whole. 03/24/22   Alver Sorrow, NP  atorvastatin (LIPITOR) 80 MG tablet Take 1 tablet (80 mg total) by mouth every evening. Patient taking differently: Take 80 mg by mouth daily. 03/24/22   Alver Sorrow, NP  Budeson-Glycopyrrol-Formoterol (BREZTRI AEROSPHERE) 160-9-4.8 MCG/ACT AERO  Inhale 2 puffs into the lungs as needed.    [provider]  carvedilol (COREG) 6.25 MG tablet Take 1 tablet (6.25 mg total) by mouth 2 (two) times daily with a meal. 05/07/22   Leroy Sea, MD  dapagliflozin propanediol (FARXIGA) 10 MG TABS tablet Take 1 tablet (10 mg total) by mouth daily. 03/24/22   Alver Sorrow, NP  furosemide (LASIX) 40 MG tablet Take 1 tablet (40 mg total) by mouth 2 (two) times daily. 05/07/22   Leroy Sea, MD   ipratropium-albuterol (DUONEB) 0.5-2.5 (3) MG/3ML SOLN Inhale 3 mLs into the lungs in the morning, at noon, in the evening, and at bedtime. 04/30/22   [provider]  isosorbide-hydrALAZINE (BIDIL) 20-37.5 MG tablet Take 1 tablet by mouth 3 (three) times daily. 03/24/22   Alver Sorrow, NP  loratadine (ALLERGY RELIEF) 10 MG tablet Take 10 mg by mouth at bedtime.    [provider]  metFORMIN (GLUCOPHAGE) 500 MG tablet Take 1 tablet (500 mg total) by mouth 2 (two) times daily with a meal. 05/07/22   Leroy Sea, MD  nitroGLYCERIN (NITROSTAT) 0.4 MG SL tablet Place 1 tablet (0.4 mg total) under the tongue every 5 (five) minutes as needed for chest pain. 03/11/22   Cipriano Bunker, MD  potassium chloride SA (KLOR-CON M) 20 MEQ tablet Take 1 tablet (20 mEq total) by mouth daily. 05/07/22   Leroy Sea, MD  sacubitril-valsartan (ENTRESTO) 97-103 MG Take 1 tablet by mouth 2 (two) times daily. 04/16/22   Rollene Rotunda, MD  umeclidinium-vilanterol (ANORO ELLIPTA) 62.5-25 MCG/ACT AEPB Inhale 1 puff into the lungs daily.    [provider]    Family History    History reviewed. No pertinent family history. He indicated that his mother is deceased. He indicated that his father is deceased.  Social History    Social History   Socioeconomic History   Marital status: Single    Spouse name: Not on file   Number of children: 1   Years of education: Not on file   Highest education level: 10th grade  Occupational History   Occupation: Disability  Tobacco Use   Smoking status: Former    Packs/day: 0.50    Years: 50.00    Total pack years: 25.00    Types: Cigarettes    Quit date: 03/19/2022    Years since quitting: 0.1   Smokeless tobacco: Never   Tobacco comments:    Smoking cessation  Vaping Use   Vaping Use: Never used  Substance and Sexual Activity   Alcohol use: Not on file    Comment: socially   Drug use: Yes    Types: Marijuana    Comment: past    Sexual activity: Not on file  Other Topics Concern   Not on file  Social History Narrative   He lives with his daughter apparently and 5 grandchildren.  He reports that he smokes probably less than a pack of cigarettes a day.  He occasionally drinks a beer or liquor but not daily.   Social Determinants of Health   Financial Resource Strain: Low Risk  (03/11/2022)   Overall Financial Resource Strain (CARDIA)    Difficulty of Paying Living Expenses: Not very hard  Food Insecurity: No Food Insecurity (05/06/2022)   Hunger Vital Sign    Worried About Running Out of Food in the Last Year: Never true    Ran Out of Food in the Last Year: Never true  Transportation  Needs: No Transportation Needs (05/06/2022)   PRAPARE - Hydrologist (Medical): No    Lack of Transportation (Non-Medical): No  Physical Activity: Not on file  Stress: Not on file  Social Connections: Not on file  Intimate Partner Violence: Not on file     Review of Systems    General:  No chills, fever, night sweats or weight changes.  Cardiovascular:  No chest pain, dyspnea on exertion, edema, orthopnea, palpitations, paroxysmal nocturnal dyspnea. Dermatological: No rash, lesions/masses Respiratory: No cough, dyspnea Urologic: No hematuria, dysuria Abdominal:   No nausea, vomiting, diarrhea, bright red blood per rectum, melena, or hematemesis Neurologic:  No visual changes, wkns, changes in mental status. All other systems reviewed and are otherwise negative except as noted above.  Physical Exam    VS:  BP 108/64   Pulse 94   Ht 5' 5.5" (1.664 m)   Wt 150 lb 9.6 oz (68.3 kg)   SpO2 93%   BMI 24.68 kg/m  , BMI Body mass index is 24.68 kg/m. GEN: Well nourished, well developed, in no acute distress. HEENT: normal. Neck: Supple, no JVD, carotid bruits, or masses. Cardiac: RRR, no murmurs, rubs, or gallops. No clubbing, cyanosis, edema.  Radials/DP/PT 2+ and equal bilaterally.  Respiratory:   Respirations regular and unlabored, clear to auscultation bilaterally. GI: Soft, nontender, nondistended, BS + x 4. MS: no deformity or atrophy. Skin: warm and dry, no rash. Neuro:  Strength and sensation are intact. Psych: Normal affect.  Accessory Clinical Findings    Recent Labs: 05/07/2022: ALT 27; B Natriuretic Peptide 225.2; BUN 24; Creatinine, Ser 1.41; Hemoglobin 15.6; Magnesium 2.2; Platelets 165; Potassium 3.8; Sodium 133   Recent Lipid Panel No results found for: "CHOL", "TRIG", "HDL", "CHOLHDL", "VLDL", "LDLCALC", "LDLDIRECT"  ECG personally reviewed by me today-none today.  Echocardiogram 03/06/2022  IMPRESSIONS     1. Left ventricular ejection fraction, by estimation, is 35%. The left  ventricle has moderately decreased function. The left ventricle  demonstrates global hypokinesis. Left ventricular diastolic parameters are  consistent with Grade II diastolic  dysfunction (pseudonormalization).   2. Right ventricular systolic function is normal. The right ventricular  size is normal. There is moderately elevated pulmonary artery systolic  pressure. The estimated right ventricular systolic pressure is 0000000 mmHg.   3. Left atrial size was mildly dilated.   4. Right atrial size was mildly dilated.   5. The mitral valve is degenerative. Trivial mitral valve regurgitation.  No evidence of mitral stenosis. Moderate mitral annular calcification.   6. The aortic valve is tricuspid. There is mild calcification of the  aortic valve. Aortic valve regurgitation is not visualized. No aortic  stenosis is present.   7. The inferior vena cava is normal in size with <50% respiratory  variability, suggesting right atrial pressure of 8 mmHg.  Assessment & Plan   1.  Chronic systolic CHF, nonischemic cardiomyopathy-no increased DOE or activity intolerance.  Euvolemic today.  Weight today 150.6lbs.  Dry weight around 151 pounds Continue carvedilol, furosemide, Farxiga, BiDil,  Entresto Heart healthy low-sodium diet-salty 6 given Increase physical activity as tolerated  Coronary artery disease-no chest pain today.  Denies exertional chest discomfort. Continue aspirin, atorvastatin, carvedilol, nitroglycerin as needed Heart healthy low-sodium diet-salty 6 given Increase physical activity as tolerated  Hyperlipidemia-on statin therapy. Continue aspirin, atorvastatin Heart healthy low-sodium high-fiber diet Increase physical activity as tolerated  Nonsustained ventricular tachycardia-denies recent episodes of accelerated or irregular heartbeat. Continue carvedilol Heart healthy low-sodium diet-salty  6 given Increase physical activity as tolerated Avoid triggers caffeine, chocolate, EtOH, dehydration etc.  Secondary pulmonary hypertension-no increased DOE.  Euvolemic. Elevated pulmonary pressures on echocardiogram felt to be result of left heart disease and lung disease. Continue carvedilol, BiDil, Lisabeth Register  CKD-creatinine 1.41 on 05/07/22.  Baseline creatinine around 1.28-1.3 Follows with PCP  Disposition: Follow-up with Dr. Percival Spanish in 3-4 months.   Jossie Ng. Ronnika Collett NP-C     05/12/2022, 4:10 PM [facility] [place] [address] Office [phone] Fax [phone]  Notice: This dictation was prepared with Dragon dictation along with smaller phrase technology. Any transcriptional errors that result from this process are unintentional and may not be corrected upon review.  I spent 14 minutes examining this patient, reviewing medications, and using patient centered shared decision making involving her cardiac care.  Prior to her visit I spent greater than 20 minutes reviewing her past medical history,  medications, and prior cardiac tests.

## 2022-05-12 ENCOUNTER — Ambulatory Visit (INDEPENDENT_AMBULATORY_CARE_PROVIDER_SITE_OTHER): Payer: Medicare Other | Admitting: General Practice

## 2022-05-12 ENCOUNTER — Encounter: Payer: Self-pay | Admitting: General Practice

## 2022-05-12 ENCOUNTER — Ambulatory Visit: Payer: Medicare Other | Admitting: General Practice

## 2022-05-12 VITALS — BP 108/64 | HR 94 | Ht 65.5 in | Wt 150.6 lb

## 2022-05-12 DIAGNOSIS — I428 Other cardiomyopathies: Secondary | ICD-10-CM | POA: Diagnosis not present

## 2022-05-12 DIAGNOSIS — I5020 Unspecified systolic (congestive) heart failure: Secondary | ICD-10-CM | POA: Diagnosis not present

## 2022-05-12 DIAGNOSIS — I2510 Atherosclerotic heart disease of native coronary artery without angina pectoris: Secondary | ICD-10-CM

## 2022-05-12 DIAGNOSIS — E785 Hyperlipidemia, unspecified: Secondary | ICD-10-CM

## 2022-05-12 NOTE — Patient Instructions (Signed)
Medication Instructions:  The current medical regimen is effective;  continue present plan and medications as directed. Please refer to the Current Medication list given to you today.   *If you need a refill on your cardiac medications before your next appointment, please call your pharmacy*  Lab Work:   Testing/Procedures:  NONE    NONE  If you have labs (blood work) drawn today and your tests are completely normal, you will receive your results only by: MyChart Message (if you have MyChart) OR  A paper copy in the mail If you have any lab test that is abnormal or we need to change your treatment, we will call you to review the results.   Special Instructions PLEASE READ AND FOLLOW SALTY 6-ATTACHED-1,800mg  daily  TAKE AND LOG YOUR WEIGHT DAILY  Follow-Up: Your next appointment:  3-4 month(s) In Person with Rollene Rotunda, MD  or Edd Fabian, FNP       At [facility], you and your health needs are our priority.  As part of our continuing mission to provide you with exceptional heart care, we have created designated Provider Care Teams.  These Care Teams include your primary Cardiologist (physician) and Advanced Practice Providers (APPs -  Physician Assistants and Nurse Practitioners) who all work together to provide you with the care you need, when you need it.  We recommend signing up for the patient portal called "MyChart".  Sign up information is provided on this After Visit Summary.  MyChart is used to connect with patients for Virtual Visits (Telemedicine).  Patients are able to view lab/test results, encounter notes, upcoming appointments, etc.  Non-urgent messages can be sent to your provider as well.   To learn more about what you can do with MyChart, go to [email or web address].    Important Information About Sugar             6 SALTY THINGS TO AVOID     1,800MG  DAILY

## 2022-05-15 ENCOUNTER — Ambulatory Visit: Payer: Medicare Other | Admitting: Pulmonary Disease

## 2022-05-18 NOTE — Progress Notes (Incomplete)
HEART & VASCULAR TRANSITION OF CARE CONSULT NOTE     Referring Physician: Dr. Eldridge Tran Primary Care: Marcus Aldo, NP Primary Cardiologist: Dr. Antoine Tran  HPI: Referred to clinic by Dr. Eldridge Tran with [name] for heart failure consultation. 71 y.o. [name] with hx tobacco use. Did not seek routine medical are until recently.   Admitted 03/06/2022 with acute hypoxic respiratory failure secondary to suspected COPD and acute systolic CHF.  Initially in respiratory distress and required BiPAP. BP significantly elevated at 189/133, remained elevated throughout much of admission but improved with titration of GDMT. CTA chest negative for PE. Echo EF 35%, RV okay, RVSP 54 mmhg. R/LHC: nonobstructive CAD, RA mean 12, PA 54/29 (37), PCWP mean 23, LVEDP 22 mmHg, Fick CO/CI 3.84/2.12. He diuresed with IV lasix. Discharged home on 03/11/22, weight 05/23 163 lb.   He was readmitted 06/02-06/04/23 with acute on chronic respiratory failure with hypoxia secondary to acute COPD exacerbation. Also thought to possibly have some component of a/c CHF. He diuresed with IV lasix and received doxycycline + prednisone. Discharge weight 151 lb.   Seen in [facility] clinic on 06/05. Volume looked good. Added entresto and cut back lasix. He was referred back to Cardiology. 14 day zio ordered at cardiology f/u - rhythm was sinus with short runs NSVT and SVT, bradyarrhythmias, idioventricular rhythm, with episodes of third-degree HB noted during sleep hours.  Saw Dr. Antoine Tran for f/u 06/29. Bradycardia thought to be possibly d/t untreated OSA. Sleep evaluation recommended before considering a device. Sleep study ordered. He was having more shortness of breath and cough. Volume looked good. He was referred to pulmonary and was treated the same day for suspected COPD exacerbation. Pulmonary ordered PFTs.  He was admitted 05/04/22 with acute respiratory failure with hypoxia. O2 sats 80s. He initially required BiPAP. Hypertensive  with SBP 210/120 and placed on nitro gtt. He was admitted for AECOPD and a/c CHF.  Diuresed with IV lasix. Cardiology consulted and felt presentation more likely consistent with AECOPD rather than CHF. Had runs of NSVT up to 20 beats in duration.   He is here today for hospital follow-up.  Continues to have trouble with shortness of breath and cough. He is using his inhalers but discovered he was using his anoro ellipta incorrectly. Missed his appointment with Pulmonary 07/28. Denies orthopnea, PND or LE edema. Taking medications as prescribed. His daughter arranges them in pillboxes.   His daughter arrived at end of the visit and reported that he's had several syncopal episodes recently. Episodes typically occur while he is up standing or walking. He gets dizzy, typically falls on the floor and regains consciousness within a few seconds. Last episode was a week and a half ago, occurred while he was taking out the trash.  He lives alone. 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     Review of Systems: [y] = yes, [ ]  = no   General: Weight gain [ ] ; Weight loss [ ] ; Anorexia [ ] ; Fatigue [Y]; Fever [ ] ; Chills [ ] ; Weakness [ ]   Cardiac: Chest pain/pressure [ ] ; Resting SOB [ ] ; Exertional SOB [Y]; Orthopnea [ ] ; Pedal Edema [ ] ; Palpitations [ ] ; Syncope [ ] ; Presyncope [ ] ; Paroxysmal nocturnal dyspnea[ ]   Pulmonary: Cough [Y]; Wheezing[ ] ; Hemoptysis[ ] ; Sputum [ ] ; Snoring [ ]   GI: Vomiting[ ] ; Dysphagia[ ] ; Melena[ ] ; Hematochezia [ ] ; Heartburn[ ] ; Abdominal pain [ ] ; Constipation [ ] ; Diarrhea [ ] ;  BRBPR [ ]   GU: Hematuria[ ] ; Dysuria [ ] ; Nocturia[ ]   Vascular: Pain in legs with walking [ ] ; Pain in feet with lying flat [ ] ; Non-healing sores [ ] ; Stroke [ ] ; TIA [ ] ; Slurred speech [ ] ;  Neuro: Headaches[ ] ; Vertigo[ ] ; Seizures[ ] ; Paresthesias[ ] ;Blurred vision [ ] ; Diplopia [ ] ; Vision changes [ ]   Ortho/Skin: Arthritis [ ] ;  Joint pain [ ] ; Muscle pain [ ] ; Joint swelling [ ] ; Back Pain [ ] ; Rash [ ]   Psych: Depression[ ] ; Anxiety[ ]   Heme: Bleeding problems [ ] ; Clotting disorders [ ] ; Anemia [ ]   Endocrine: Diabetes [Y]; Thyroid dysfunction[ ]    Past Medical History:  Diagnosis Date   Acute metabolic encephalopathy 99991111   Acute respiratory failure with hypoxia ([place]) 03/20/2022   Acute respiratory failure with hypoxia and hypercapnia ([facility]) 03/06/2022   CHF (congestive heart failure) ([facility])    Dyslipidemia    Hypertension     Current Outpatient Medications  Medication Sig Dispense Refill   albuterol (VENTOLIN HFA) 108 (90 Base) MCG/ACT inhaler Inhale 2 puffs into the lungs every 4 (four) hours as needed for wheezing or shortness of breath. 1 each 11   aspirin EC 81 MG tablet Take 1 tablet (81 mg total) by mouth daily. Swallow whole. 90 tablet 3   atorvastatin (LIPITOR) 80 MG tablet Take 1 tablet (80 mg total) by mouth every evening. (Patient taking differently: Take 80 mg by mouth daily.) 90 tablet 3   bisoprolol (ZEBETA) 5 MG tablet Take 0.5 tablets (2.5 mg total) by mouth daily. 45 tablet 3   Budeson-Glycopyrrol-Formoterol (BREZTRI AEROSPHERE) 160-9-4.8 MCG/ACT AERO Inhale 2 puffs into the lungs as needed.     dapagliflozin propanediol (FARXIGA) 10 MG TABS tablet Take 1 tablet (10 mg total) by mouth daily. 30 tablet 5   furosemide (LASIX) 40 MG tablet Take 1 tablet (40 mg total) by mouth 2 (two) times daily. 60 tablet 0   ipratropium-albuterol (DUONEB) 0.5-2.5 (3) MG/3ML SOLN Inhale 3 mLs into the lungs in the morning, at noon, in the evening, and at bedtime.     isosorbide-hydrALAZINE (BIDIL) 20-37.5 MG tablet Take 1 tablet by mouth 3 (three) times daily. 90 tablet 3   loratadine (ALLERGY RELIEF) 10 MG tablet Take 10 mg by mouth at bedtime.     metFORMIN (GLUCOPHAGE) 500 MG tablet Take 1 tablet (500 mg total) by mouth 2 (two) times daily with a meal. 60 tablet 0   nitroGLYCERIN (NITROSTAT) 0.4 MG SL  tablet Place 1 tablet (0.4 mg total) under the tongue every 5 (five) minutes as needed for chest pain. 25 tablet 12   potassium chloride SA (KLOR-CON M) 20 MEQ tablet Take 1 tablet (20 mEq total) by mouth daily. 30 tablet 0   sacubitril-valsartan (ENTRESTO) 97-103 MG Take 1 tablet by mouth 2 (two) times daily. 180 tablet 3   umeclidinium-vilanterol (ANORO ELLIPTA) 62.5-25 MCG/ACT AEPB Inhale 1 puff into the lungs daily.     Current Facility-Administered Medications  Medication Dose Route Frequency Provider Last Rate Last Admin   bisoprolol (ZEBETA) tablet 2.5 mg  2.5 mg Oral Daily Joette Catching, PA-C        No Known Allergies    Social History   Socioeconomic History   Marital status: Single    Spouse name: Not on file   Number of children: 1   Years of education: Not on file   Highest education level: 10th grade  Occupational History  Occupation: Disability  Tobacco Use   Smoking status: Former    Packs/day: 0.50    Years: 50.00    Total pack years: 25.00    Types: Cigarettes    Quit date: 03/19/2022    Years since quitting: 0.1   Smokeless tobacco: Never   Tobacco comments:    Smoking cessation  Vaping Use   Vaping Use: Never used  Substance and Sexual Activity   Alcohol use: Not on file    Comment: socially   Drug use: Yes    Types: Marijuana    Comment: past   Sexual activity: Not on file  Other Topics Concern   Not on file  Social History Narrative   He lives with his daughter apparently and 5 grandchildren.  He reports that he smokes probably less than a pack of cigarettes a day.  He occasionally drinks a beer or liquor but not daily.   Social Determinants of Health   Financial Resource Strain: Low Risk  (03/11/2022)   Overall Financial Resource Strain (CARDIA)    Difficulty of Paying Living Expenses: Not very hard  Food Insecurity: No Food Insecurity (05/06/2022)   Hunger Vital Sign    Worried About Running Out of Food in the Last Year: Never true     Ran Out of Food in the Last Year: Never true  Transportation Needs: No Transportation Needs (05/06/2022)   PRAPARE - Administrator, Civil Service (Medical): No    Lack of Transportation (Non-Medical): No  Physical Activity: Not on file  Stress: Not on file  Social Connections: Not on file  Intimate Partner Violence: Not on file     No family history on file.  Vitals:   05/19/22 1217 05/19/22 1321 05/19/22 1322 05/19/22 1323  BP: 130/70 (!) 134/93 (!) 126/100 (!) 139/92  Pulse: 81 (!) 101    SpO2: 93%     Weight: 69.8 kg (153 lb 12.8 oz)        PHYSICAL EXAM: General:  Ambulated into clinic. No distress. HEENT: normal Neck: supple. no JVD. Carotids 2+ bilat; no bruits.  Cor: PMI nondisplaced. Regular rate & rhythm. No rubs, gallops or murmurs. Lungs: Expiratory wheezes Abdomen: soft, nontender, nondistended.  Extremities: no cyanosis, clubbing, rash, edema Neuro: alert & orientedx3, cranial nerves grossly intact. moves all 4 extremities w/o difficulty. Affect pleasant   ASSESSMENT & PLAN: Newly diagnosed HFrEF/NICM: -Echo 05/23: LVEF 35%, RV okay -R/LHC 05/23: Nonobstructive CAD, RA mean 12, PA 54/29 (37), PCWP mean 23, LVEDP 22 mmHg -Etiology not certain.  -Did not seek medical care for many years but suspect he has longstanding untreated HTN.  -NYHA III, volume looks stable today.  -Continue lasix 40 BID -Switch coreg to bisoprolol 2.5 mg daily d/t wheezing/COPD -Continue Farxiga 10 mg daily -Continue bidil 1 tab TID -Continue Entresto 97/103 mg BID -Consider spiro next -CMET/BNP today -Place LifeVest today in view of recent syncopal episodes, uncertain if d/t VT vs CHB. See below. -Reiterated fluid and sodium restriction today -Repeat echo at f/u in 4 weeks  2. Syncope: -Mulitple recent episodes. Typically occur with activity. -Uncertain etiology. -Not orthostatic today -Check CMET, BNP, CBC -Had runs NSVT during recent admit up to 20 beats. 14  day zio in June with sinus rhythm, runs of NSVT and intermittent bradycardia/third-degree HB mostly during sleep hours.  -Reviewed with Dr. Shirlee Latch. Not clear if syncope may be d/t VT vs advanced conduction system disease. Will place LifeVest and refer to EP to see  if requires PPM or ICD.  2. HTN: -BP elevated today -Meds as above -Scheduled for sleep study  3. CAD: -Nonobstructive on LHC 05/23 -Continue aspirin and statin  4. Tobacco use/suspected COPD: -Still smoking a few cigarettes a day. Cessation discussed -Multiple recent hospitalizations for AECOPD.  -Missed appointment with pulmonary last week. Forgot about appointment. His daughter will assist him with rescheduling.  5. HLD: -On Atorvastatin -Management per Cardiology or PCP  6. DM II: -New diagnosis -A1c 6.9% in 05/23 -Continue Farxiga  NYHA III GDMT  Diuretic-Furosemide 40 BID BB-Bisoprolol 2.5 mg daily Ace/ARB/ARNI-Entresto 97/103 BID MRA-Consider next SGLT2i-Farxiga 10 mg daily Bidil- 1 tab TID   Referred to HFSW (PCP, Medications, Transportation, ETOH Abuse, Drug Abuse, Insurance, Museum/gallery curator ): No Refer to Pharmacy: No Refer to Home Health: No Refer to Advanced [facility] Failure Clinic: Yes, to establish with Dr. Aundra Dubin Refer to General Cardiology:No, already established with Dr. Percival Spanish (f/u 10/23)  Follow up: 4 weeks in APP clinic with echo, to establish with Dr. Aundra Dubin. Referred to EP.

## 2022-05-19 ENCOUNTER — Encounter (HOSPITAL_COMMUNITY): Payer: Self-pay

## 2022-05-19 ENCOUNTER — Ambulatory Visit (HOSPITAL_COMMUNITY)
Admit: 2022-05-19 | Discharge: 2022-05-19 | Disposition: A | Discharge status: Home / Self Care | Payer: Medicare Other | Source: Ambulatory Visit | Attending: Physician Assistant | Admitting: Physician Assistant

## 2022-05-19 ENCOUNTER — Telehealth (HOSPITAL_COMMUNITY): Payer: Self-pay

## 2022-05-19 VITALS — BP 139/92 | HR 101 | Wt 153.8 lb

## 2022-05-19 DIAGNOSIS — Z602 Problems related to living alone: Secondary | ICD-10-CM | POA: Insufficient documentation

## 2022-05-19 DIAGNOSIS — Z79899 Other long term (current) drug therapy: Secondary | ICD-10-CM | POA: Diagnosis not present

## 2022-05-19 DIAGNOSIS — J441 Chronic obstructive pulmonary disease with (acute) exacerbation: Secondary | ICD-10-CM | POA: Insufficient documentation

## 2022-05-19 DIAGNOSIS — I110 Hypertensive heart disease with heart failure: Secondary | ICD-10-CM | POA: Diagnosis not present

## 2022-05-19 DIAGNOSIS — I428 Other cardiomyopathies: Secondary | ICD-10-CM

## 2022-05-19 DIAGNOSIS — E785 Hyperlipidemia, unspecified: Secondary | ICD-10-CM | POA: Diagnosis not present

## 2022-05-19 DIAGNOSIS — I472 Ventricular tachycardia, unspecified: Secondary | ICD-10-CM | POA: Diagnosis not present

## 2022-05-19 DIAGNOSIS — Z7984 Long term (current) use of oral hypoglycemic drugs: Secondary | ICD-10-CM | POA: Diagnosis not present

## 2022-05-19 DIAGNOSIS — I5020 Unspecified systolic (congestive) heart failure: Secondary | ICD-10-CM | POA: Diagnosis not present

## 2022-05-19 DIAGNOSIS — R296 Repeated falls: Secondary | ICD-10-CM | POA: Insufficient documentation

## 2022-05-19 DIAGNOSIS — I2510 Atherosclerotic heart disease of native coronary artery without angina pectoris: Secondary | ICD-10-CM | POA: Diagnosis not present

## 2022-05-19 DIAGNOSIS — I442 Atrioventricular block, complete: Secondary | ICD-10-CM | POA: Diagnosis not present

## 2022-05-19 DIAGNOSIS — R42 Dizziness and giddiness: Secondary | ICD-10-CM | POA: Insufficient documentation

## 2022-05-19 DIAGNOSIS — E119 Type 2 diabetes mellitus without complications: Secondary | ICD-10-CM | POA: Diagnosis not present

## 2022-05-19 DIAGNOSIS — I10 Essential (primary) hypertension: Secondary | ICD-10-CM | POA: Diagnosis not present

## 2022-05-19 DIAGNOSIS — Z87891 Personal history of nicotine dependence: Secondary | ICD-10-CM | POA: Insufficient documentation

## 2022-05-19 DIAGNOSIS — Z7982 Long term (current) use of aspirin: Secondary | ICD-10-CM | POA: Diagnosis not present

## 2022-05-19 DIAGNOSIS — R55 Syncope and collapse: Secondary | ICD-10-CM | POA: Diagnosis not present

## 2022-05-19 DIAGNOSIS — E782 Mixed hyperlipidemia: Secondary | ICD-10-CM

## 2022-05-19 LAB — COMPREHENSIVE METABOLIC PANEL
ALT: 32 U/L (ref 0–44)
AST: 30 U/L (ref 15–41)
Albumin: 3.9 g/dL (ref 3.5–5.0)
Alkaline Phosphatase: 48 U/L (ref 38–126)
Anion gap: 8 (ref 5–15)
BUN: 21 mg/dL (ref 8–23)
CO2: 26 mmol/L (ref 22–32)
Calcium: 8.9 mg/dL (ref 8.9–10.3)
Chloride: 101 mmol/L (ref 98–111)
Creatinine, Ser: 1.55 mg/dL — ABNORMAL HIGH (ref 0.61–1.24)
GFR, Estimated: 47 mL/min — ABNORMAL LOW (ref >60)
Glucose, Bld: 108 mg/dL — ABNORMAL HIGH (ref 70–99)
Potassium: 4.7 mmol/L (ref 3.5–5.1)
Sodium: 135 mmol/L (ref 135–145)
Total Bilirubin: 1.6 mg/dL — ABNORMAL HIGH (ref 0.3–1.2)
Total Protein: 6.9 g/dL (ref 6.5–8.1)

## 2022-05-19 LAB — CBC
HCT: 51 % (ref 39.0–52.0)
Hemoglobin: 16.5 g/dL (ref 13.0–17.0)
MCH: 29.3 pg (ref 26.0–34.0)
MCHC: 32.4 g/dL (ref 30.0–36.0)
MCV: 90.4 fL (ref 80.0–100.0)
Platelets: 174 10*3/uL (ref 150–400)
RBC: 5.64 MIL/uL (ref 4.22–5.81)
RDW: 13.9 % (ref 11.5–15.5)
WBC: 4.9 10*3/uL (ref 4.0–10.5)
nRBC: 0 % (ref 0.0–0.2)

## 2022-05-19 LAB — BRAIN NATRIURETIC PEPTIDE: B Natriuretic Peptide: 170.8 pg/mL — ABNORMAL HIGH (ref 0.0–100.0)

## 2022-05-19 MED ORDER — BISOPROLOL FUMARATE 5 MG PO TABS: 2.5000 mg | ORAL_TABLET | Freq: Every day | ORAL | Status: DC

## 2022-05-19 MED ORDER — BISOPROLOL FUMARATE 5 MG PO TABS: 2.5000 mg | ORAL_TABLET | Freq: Every day | ORAL | 3 refills | Status: DC

## 2022-05-19 NOTE — Addendum Note (Signed)
Encounter addended by: Andrey Farmer, PA-C on: 05/19/2022 6:02 PM  Actions taken: Visit diagnoses modified, Diagnosis association updated

## 2022-05-19 NOTE — Patient Instructions (Signed)
STOP Carvedilol  START Bisoprolol 2.5mg  (!/2 Tab) Daily  Labs done today, your results will be available in MyChart, we will contact you for abnormal readings.  Your physician has requested that you have an echocardiogram. Echocardiography is a painless test that uses sound waves to create images of your heart. It provides your doctor with information about the size and shape of your heart and how well your heart's chambers and valves are working. This procedure takes approximately one hour. There are no restrictions for this procedure.  You have been referred to the Electrophysiologist.  They will call you to arrange the appointment   Your physician recommends that you schedule a follow-up appointment in: 4 weeks   If you have any questions or concerns before your next appointment please send [place] a message through mychart or call our office at [phone].    TO LEAVE A MESSAGE FOR THE NURSE SELECT OPTION 2, PLEASE LEAVE A MESSAGE INCLUDING: YOUR NAME DATE OF BIRTH CALL BACK NUMBER REASON FOR CALL**this is important as we prioritize the call backs  YOU WILL RECEIVE A CALL BACK THE SAME DAY AS LONG AS YOU CALL BEFORE 4:00 PM  At the [facility], you and your health needs are our priority. As part of our continuing mission to provide you with exceptional heart care, we have created designated Provider Care Teams. These Care Teams include your primary Cardiologist (physician) and Advanced Practice Providers (APPs- Physician Assistants and Nurse Practitioners) who all work together to provide you with the care you need, when you need it.   You may see any of the following providers on your designated Care Team at your next follow up: Dr Arvilla Meres Dr Carron Curie, NP Robbie Lis, [place] [facility] [place], [place] Karle Plumber, PharmD   Please be sure to bring in all your medications bottles to every appointment.

## 2022-05-19 NOTE — Telephone Encounter (Signed)
Left message for patient reminding him of appointment in [facility] today @1200 

## 2022-05-20 ENCOUNTER — Telehealth (HOSPITAL_COMMUNITY): Payer: Self-pay

## 2022-05-20 NOTE — Telephone Encounter (Signed)
[employer] Life vest order faxed on 05/19/2022 at 2pm

## 2022-05-21 ENCOUNTER — Encounter: Payer: Self-pay | Admitting: Pulmonary Disease

## 2022-05-21 ENCOUNTER — Ambulatory Visit (INDEPENDENT_AMBULATORY_CARE_PROVIDER_SITE_OTHER): Payer: Medicare Other | Admitting: Pulmonary Disease

## 2022-05-21 VITALS — BP 120/62 | HR 99 | Wt 158.4 lb

## 2022-05-21 DIAGNOSIS — R053 Chronic cough: Secondary | ICD-10-CM | POA: Diagnosis not present

## 2022-05-21 DIAGNOSIS — R0609 Other forms of dyspnea: Secondary | ICD-10-CM | POA: Diagnosis not present

## 2022-05-21 MED ORDER — IPRATROPIUM-ALBUTEROL 0.5-2.5 (3) MG/3ML IN SOLN: 3.0000 mL | Freq: Four times a day (QID) | RESPIRATORY_TRACT | 11 refills | Status: DC

## 2022-05-21 MED ORDER — MONTELUKAST SODIUM 10 MG PO TABS: 10.0000 mg | ORAL_TABLET | Freq: Every day | ORAL | 11 refills | Status: DC

## 2022-05-21 MED ORDER — BREZTRI AEROSPHERE 160-9-4.8 MCG/ACT IN AERO: 2.0000 | INHALATION_SPRAY | RESPIRATORY_TRACT | 11 refills | Status: DC | PRN
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
## 2022-05-21 NOTE — Patient Instructions (Signed)
Nice to see you again  I refilled the Breztri inhaler, use 2 puffs twice a day every day.  I refilled your albuterol rescue inhaler, use 2 puffs as needed for shortness of breath or tight chest tightness.  To help with some of the cough I recommend using montelukast at night.  This is a medicine to help treat inflammation and mucus production in the sinuses and nose as that can cause worsening cough and sputum production.  I am glad the cough is improving.  Return to clinic in 3 months or sooner as needed with Dr. Judeth Horn

## 2022-05-27 ENCOUNTER — Encounter (HOSPITAL_BASED_OUTPATIENT_CLINIC_OR_DEPARTMENT_OTHER): Payer: Medicare Other | Admitting: Cardiology

## 2022-06-18 ENCOUNTER — Ambulatory Visit (HOSPITAL_COMMUNITY)
Admission: RE | Admit: 2022-06-18 | Discharge: 2022-06-18 | Disposition: A | Discharge status: Home / Self Care | Payer: Medicare Other | Source: Ambulatory Visit | Attending: Cardiology | Admitting: Cardiology

## 2022-06-18 ENCOUNTER — Encounter (HOSPITAL_COMMUNITY): Payer: Self-pay

## 2022-06-18 ENCOUNTER — Ambulatory Visit (HOSPITAL_BASED_OUTPATIENT_CLINIC_OR_DEPARTMENT_OTHER)
Admission: RE | Admit: 2022-06-18 | Discharge: 2022-06-18 | Disposition: A | Discharge status: Home / Self Care | Payer: Medicare Other | Source: Ambulatory Visit

## 2022-06-18 VITALS — BP 144/102 | HR 76 | Wt 152.4 lb

## 2022-06-18 DIAGNOSIS — Z7982 Long term (current) use of aspirin: Secondary | ICD-10-CM | POA: Insufficient documentation

## 2022-06-18 DIAGNOSIS — I5022 Chronic systolic (congestive) heart failure: Secondary | ICD-10-CM | POA: Insufficient documentation

## 2022-06-18 DIAGNOSIS — J441 Chronic obstructive pulmonary disease with (acute) exacerbation: Secondary | ICD-10-CM | POA: Insufficient documentation

## 2022-06-18 DIAGNOSIS — I428 Other cardiomyopathies: Secondary | ICD-10-CM | POA: Insufficient documentation

## 2022-06-18 DIAGNOSIS — Z79899 Other long term (current) drug therapy: Secondary | ICD-10-CM | POA: Insufficient documentation

## 2022-06-18 DIAGNOSIS — R55 Syncope and collapse: Secondary | ICD-10-CM | POA: Diagnosis not present

## 2022-06-18 DIAGNOSIS — I110 Hypertensive heart disease with heart failure: Secondary | ICD-10-CM | POA: Diagnosis present

## 2022-06-18 DIAGNOSIS — I5020 Unspecified systolic (congestive) heart failure: Secondary | ICD-10-CM

## 2022-06-18 DIAGNOSIS — E119 Type 2 diabetes mellitus without complications: Secondary | ICD-10-CM | POA: Diagnosis not present

## 2022-06-18 DIAGNOSIS — F17210 Nicotine dependence, cigarettes, uncomplicated: Secondary | ICD-10-CM | POA: Insufficient documentation

## 2022-06-18 DIAGNOSIS — R0603 Acute respiratory distress: Secondary | ICD-10-CM | POA: Diagnosis not present

## 2022-06-18 DIAGNOSIS — Z7984 Long term (current) use of oral hypoglycemic drugs: Secondary | ICD-10-CM | POA: Insufficient documentation

## 2022-06-18 DIAGNOSIS — I2510 Atherosclerotic heart disease of native coronary artery without angina pectoris: Secondary | ICD-10-CM | POA: Diagnosis not present

## 2022-06-18 DIAGNOSIS — E785 Hyperlipidemia, unspecified: Secondary | ICD-10-CM | POA: Insufficient documentation

## 2022-06-18 LAB — BASIC METABOLIC PANEL
Anion gap: 7 (ref 5–15)
BUN: 18 mg/dL (ref 8–23)
CO2: 30 mmol/L (ref 22–32)
Calcium: 8.7 mg/dL — ABNORMAL LOW (ref 8.9–10.3)
Chloride: 99 mmol/L (ref 98–111)
Creatinine, Ser: 1.48 mg/dL — ABNORMAL HIGH (ref 0.61–1.24)
GFR, Estimated: 50 mL/min — ABNORMAL LOW (ref >60)
Glucose, Bld: 108 mg/dL — ABNORMAL HIGH (ref 70–99)
Potassium: 3.7 mmol/L (ref 3.5–5.1)
Sodium: 136 mmol/L (ref 135–145)

## 2022-06-18 LAB — ECHOCARDIOGRAM COMPLETE
Area-P 1/2: 2.2 cm2
S' Lateral: 3.6 cm

## 2022-06-18 LAB — BRAIN NATRIURETIC PEPTIDE: B Natriuretic Peptide: 189.5 pg/mL — ABNORMAL HIGH (ref 0.0–100.0)

## 2022-06-18 MED ORDER — SPIRONOLACTONE 25 MG PO TABS: 12.5000 mg | ORAL_TABLET | Freq: Every day | ORAL | 3 refills | Status: DC

## 2022-06-18 MED ORDER — BISOPROLOL FUMARATE 5 MG PO TABS: 5.0000 mg | ORAL_TABLET | Freq: Every day | ORAL | 3 refills | Status: DC

## 2022-06-18 NOTE — Progress Notes (Signed)
stop

## 2022-06-18 NOTE — Progress Notes (Addendum)
Paramedicine Initial Assessment:  Housing:  In what kind of housing do you live? House/apt/trailer/shelter? House  Do you rent/pay a mortgage/own? rent  Do you live with anyone? Lives with dtr and two grandkids  Are you currently worried about losing your housing? no  Social:  What is your current marital status? single  Do you have any children? dtr  Do you have family or friends who live locally? No one other than her dtr  Food:  No concerns- dtr gets stamps  Income:  What is your current source of income? Gets about $1000/month  Insurance:  Are you currently insured?  Yes [employer] Medicare  Do you have prescription coverage? Medicare and Medicaid  Transportation:  Do you have transportation to your medical appointments? Took taxi today and needs help getting home- transportation can be an issue sometimes when dtr isn't avaliable    Daily Health Needs: Do you have a working scale at home? yes  How do you manage your medications at home? Dtr has been helping with medication management which pt states is going well  If yes, has this ever prevented you from obtaining medications?no  Do you have any concerns with mobility at home? no  Do you use any assistive devices at home or have PCS at home? no  Do you have a PCP? Hillery Aldo at [facility]  Do you have any trouble reading or writing? Struggles some with reading and writing  Do you currently use tobacco products or have recently quit? Smokes socially but doesn't buy it for himself anymore.  Interested in talking to health coach quitting all together  Are there any additional barriers you see to getting the care you need? no  CSW will continue to follow through paramedicine program and assist as needed.  Burna Sis, LCSW Clinical Social Worker [facility] Desk#: [phone] Cell#: [phone]

## 2022-06-18 NOTE — Patient Instructions (Signed)
Medication Changes:  Increase: Bisoprolol 5 mg by mouth daily. Start: Spironolactone 12.5 tablet by mouth daily.  Lab Work:  Labs today We will only contact you if something comes back abnormal or we need to make some changes. Otherwise no news is good news!   Return: In 1 week for labs- [facility]    Follow-Up in: App with Nurse Practitioner/Physicians Assistant in 3 weeks.  At the [facility], you and your health needs are our priority. We have a designated team specialized in the treatment of Heart Failure. This Care Team includes your primary Heart Failure Specialized Cardiologist (physician), Advanced Practice Providers (APPs- Physician Assistants and Nurse Practitioners), and Pharmacist who all work together to provide you with the care you need, when you need it.   You may see any of the following providers on your designated Care Team at your next follow up:  Dr Glori Bickers Dr Haynes Kerns, NP Lyda Jester, [facility] [place], [place] Audry Riles, PharmD   Please be sure to bring in all your medications bottles to every appointment.   Need to Contact us:  If you have any questions or concerns before your next appointment please send [place] a message through [place] or call our office at [phone].    TO LEAVE A MESSAGE FOR THE NURSE SELECT OPTION 2, PLEASE LEAVE A MESSAGE INCLUDING: YOUR NAME DATE OF BIRTH CALL BACK NUMBER REASON FOR CALL**this is important as we prioritize the call backs  YOU WILL RECEIVE A CALL BACK THE SAME DAY AS LONG AS YOU CALL BEFORE 4:00 PM

## 2022-06-18 NOTE — Progress Notes (Signed)
ADVANCED HF CLINIC CONSULT NOTE  Referring Physician: [facility]  Primary Care: Cipriano Mile, NP Primary Cardiologist: Minus Breeding, MD  [facility]: Assigned to Dr. Aundra Dubin   HPI:  71 y.o. [name] with hx tobacco use. Did not seek routine medical are until recently.    Admitted 03/06/2022 with acute hypoxic respiratory failure secondary to suspected COPD and acute systolic CHF.  Initially in respiratory distress and required BiPAP. BP significantly elevated at 189/133, remained elevated throughout much of admission but improved with titration of GDMT. CTA chest negative for PE. Echo EF 35%, RV okay, RVSP 54 mmhg. R/LHC: nonobstructive CAD, RA mean 12, PA 54/29 (37), PCWP mean 23, LVEDP 22 mmHg, Fick CO/CI 3.84/2.12. He diuresed with IV lasix. Discharged home on 03/11/22, weight 05/23 163 lb.    He was readmitted 06/02-06/04/23 with acute on chronic respiratory failure with hypoxia secondary to acute COPD exacerbation. Also thought to possibly have some component of a/c CHF. He diuresed with IV lasix and received doxycycline + prednisone. Discharge weight 151 lb.    Seen in [facility] clinic on 06/05. Volume looked good. Added entresto and cut back lasix. He was referred back to Cardiology. 14 day zio ordered at cardiology f/u - rhythm was sinus with short runs NSVT and SVT, bradyarrhythmias, idioventricular rhythm, with episodes of third-degree HB noted during sleep hours.   Saw Dr. Percival Spanish for f/u 06/29. Bradycardia thought to be possibly d/t untreated OSA. Sleep evaluation recommended before considering a device. Sleep study ordered. He was having more shortness of breath and cough. Volume looked good. He was referred to pulmonary and was treated the same day for suspected COPD exacerbation. Pulmonary ordered PFTs.   He was admitted 05/04/22 with acute respiratory failure with hypoxia. O2 sats 80s. He initially required BiPAP. Hypertensive with SBP 210/120 and placed on nitro gtt. He was  admitted for AECOPD and a/c CHF.  Diuresed with IV lasix. Cardiology consulted and felt presentation more likely consistent with AECOPD rather than CHF. Had runs of NSVT up to 20 beats in duration.    Referred to [facility] clinic for post hospital. Was fitted for LifeVest and GDMT adjusted. Referred to the AHF clinic for further management.   Presents today for f/u. Her alone. Reports daughter has been helping w/ meds and sets up in pillbox. He reports full compliance w/ meds, though it sounds as though he may not be taking Bidil TID. Low literacy. Needs paramedicine.   From symptom standpoint, he reports doing fairly decent, NYHA Class II-early III. Denies resting dyspnea. No CP. Denies palpitations. Reports compliance w/ LifeVest. Report shows average wear time of 23.99 hr/day. No alarms. No VT/VF.   BP elevated 144/102.    He previously worked jobs with [employer]. Still does occasional landscaping jobs.    Review of Systems: [y] = yes, [ ]  = no   General: Weight gain [ ] ; Weight loss [ ] ; Anorexia [ ] ; Fatigue [ ] ; Fever [ ] ; Chills [ ] ; Weakness [ ]   Cardiac: Chest pain/pressure [ ] ; Resting SOB [ ] ; Exertional SOB [ Y]; Orthopnea [ ] ; Pedal Edema [ ] ; Palpitations [ ] ; Syncope [ ] ; Presyncope [ ] ; Paroxysmal nocturnal dyspnea[ ]   Pulmonary: Cough [ ] ; Wheezing[ ] ; Hemoptysis[ ] ; Sputum [ ] ; Snoring [ ]   GI: Vomiting[ ] ; Dysphagia[ ] ; Melena[ ] ; Hematochezia [ ] ; Heartburn[ ] ; Abdominal pain [ ] ; Constipation [ ] ; Diarrhea [ ] ; BRBPR [ ]   GU: Hematuria[ ] ; Dysuria [ ] ;  Nocturia[ ]   Vascular: Pain in legs with walking [ ] ; Pain in feet with lying flat [ ] ; Non-healing sores [ ] ; Stroke [ ] ; TIA [ ] ; Slurred speech [ ] ;  Neuro: Headaches[ ] ; Vertigo[ ] ; Seizures[ ] ; Paresthesias[ ] ;Blurred vision [ ] ; Diplopia [ ] ; Vision changes [ ]   Ortho/Skin: Arthritis [ ] ; Joint pain [ ] ; Muscle pain [ ] ; Joint swelling [ ] ; Back Pain [ ] ; Rash [ ]   Psych: Depression[ ] ; Anxiety[ ]   Heme:  Bleeding problems [ ] ; Clotting disorders [ ] ; Anemia [ ]   Endocrine: Diabetes [ ] ; Thyroid dysfunction[ ]    Past Medical History:  Diagnosis Date   Acute metabolic encephalopathy 03/08/2022   Acute respiratory failure with hypoxia (HCC) 03/20/2022   Acute respiratory failure with hypoxia and hypercapnia (HCC) 03/06/2022   CHF (congestive heart failure) (HCC)    Dyslipidemia    Hypertension     Current Outpatient Medications  Medication Sig Dispense Refill   aspirin EC 81 MG tablet Take 1 tablet (81 mg total) by mouth daily. Swallow whole. 90 tablet 3   atorvastatin (LIPITOR) 80 MG tablet Take 1 tablet (80 mg total) by mouth every evening. (Patient taking differently: Take 80 mg by mouth daily.) 90 tablet 3   Budeson-Glycopyrrol-Formoterol (BREZTRI AEROSPHERE) 160-9-4.8 MCG/ACT AERO Inhale 2 puffs into the lungs as needed. 10.6 g 11   dapagliflozin propanediol (FARXIGA) 10 MG TABS tablet Take 1 tablet (10 mg total) by mouth daily. 30 tablet 5   furosemide (LASIX) 40 MG tablet Take 1 tablet (40 mg total) by mouth 2 (two) times daily. 60 tablet 0   ipratropium-albuterol (DUONEB) 0.5-2.5 (3) MG/3ML SOLN Inhale 3 mLs into the lungs in the morning, at noon, in the evening, and at bedtime. 360 mL 11   isosorbide-hydrALAZINE (BIDIL) 20-37.5 MG tablet Take 1 tablet by mouth 3 (three) times daily. 90 tablet 3   loratadine (ALLERGY RELIEF) 10 MG tablet Take 10 mg by mouth at bedtime.     metFORMIN (GLUCOPHAGE) 500 MG tablet Take 1 tablet (500 mg total) by mouth 2 (two) times daily with a meal. 60 tablet 0   potassium chloride SA (KLOR-CON M) 20 MEQ tablet Take 1 tablet (20 mEq total) by mouth daily. 30 tablet 0   sacubitril-valsartan (ENTRESTO) 97-103 MG Take 1 tablet by mouth 2 (two) times daily. 180 tablet 3   spironolactone (ALDACTONE) 25 MG tablet Take 0.5 tablets (12.5 mg total) by mouth daily. 15 tablet 3   albuterol (VENTOLIN HFA) 108 (90 Base) MCG/ACT inhaler Inhale 2 puffs into the lungs every  4 (four) hours as needed for wheezing or shortness of breath. 1 each 11   bisoprolol (ZEBETA) 5 MG tablet Take 1 tablet (5 mg total) by mouth daily. 30 tablet 3   nitroGLYCERIN (NITROSTAT) 0.4 MG SL tablet Place 1 tablet (0.4 mg total) under the tongue every 5 (five) minutes as needed for chest pain. (Patient not taking: Reported on 06/18/2022) 25 tablet 12   No current facility-administered medications for this encounter.    No Known Allergies    Social History   Socioeconomic History   Marital status: Single    Spouse name: Not on file   Number of children: 1   Years of education: Not on file   Highest education level: 10th grade  Occupational History   Occupation: Disability  Tobacco Use   Smoking status: Former    Packs/day: 0.50    Years: 50.00    Total pack  years: 25.00    Types: Cigarettes    Quit date: 03/19/2022    Years since quitting: 0.2   Smokeless tobacco: Never   Tobacco comments:    Smoking cessation  Vaping Use   Vaping Use: Never used  Substance and Sexual Activity   Alcohol use: Not on file    Comment: socially   Drug use: Yes    Types: Marijuana    Comment: past   Sexual activity: Not on file  Other Topics Concern   Not on file  Social History Narrative   He lives with his daughter apparently and 5 grandchildren.  He reports that he smokes probably less than a pack of cigarettes a day.  He occasionally drinks a beer or liquor but not daily.   Social Determinants of Health   Financial Resource Strain: Low Risk  (03/11/2022)   Overall Financial Resource Strain (CARDIA)    Difficulty of Paying Living Expenses: Not very hard  Food Insecurity: No Food Insecurity (05/06/2022)   Hunger Vital Sign    Worried About Running Out of Food in the Last Year: Never true    Ran Out of Food in the Last Year: Never true  Transportation Needs: No Transportation Needs (05/06/2022)   PRAPARE - Administrator, Civil Service (Medical): No    Lack of  Transportation (Non-Medical): No  Physical Activity: Not on file  Stress: Not on file  Social Connections: Not on file  Intimate Partner Violence: Not on file      Family History  Problem Relation Age of Onset   Hypertension Mother     Vitals:   06/18/22 1502  BP: (!) 144/102  Pulse: 76  SpO2: 98%  Weight: 69.1 kg (152 lb 6.4 oz)    PHYSICAL EXAM: General:  Well appearing. No respiratory difficulty HEENT: normal Neck: supple. no JVD. Carotids 2+ bilat; no bruits. No lymphadenopathy or thryomegaly appreciated. Cor: PMI nondisplaced. Regular rate & rhythm. No rubs, gallops or murmurs. Lungs: clear Abdomen: soft, nontender, nondistended. No hepatosplenomegaly. No bruits or masses. Good bowel sounds. Extremities: no cyanosis, clubbing, rash, edema Neuro: alert & oriented x 3, cranial nerves grossly intact. moves all 4 extremities w/o difficulty. Affect pleasant.  ECG: not performed    ASSESSMENT & PLAN:   Chronic Systolic Heart Failure/ NICM: -Echo 05/23: LVEF 35%, RV okay -R/LHC 05/23: Nonobstructive CAD, RA mean 12, PA 54/29 (37), PCWP mean 23, LVEDP 22 mmHg -Etiology not certain.  -Did not seek medical care for many years but suspect he has longstanding untreated HTN.  -NYHA II- early III. Euvolemic on exam. BP elevated. F/u Echo completed today, interpretation pending - Continue Farxiga 10 mg daily  - Continue Entresto 97-103 mg bid - Increase Bisoprolol to 5 mg daily  - Add Spironolactone 12.5 mg daily (may need to d/c KCl pending BMP) - Continue Bidil 1 tab tid  - Continue lasix 40 BID - BMP + BNP today  - Continue w/ LifeVest. Has EP consultation w/ Dr. Ladona Ridgel regarding possible ICD. Echo  pending  - Refer to paramedicine to help w/ meds  -Reiterated fluid and sodium restriction today  2. Syncope: -Mulitple recent episodes. Typically occur with activity. None reported since last visit  -Uncertain etiology. -Not orthostatic today -C/w LifeVest. EP consult  scheduled    2. HTN: -BP elevated today -Med titration per above  -Scheduled for sleep study   3. CAD: -Nonobstructive on LHC 05/23 - no chest pain  -Continue aspirin and statin  4. Tobacco use/suspected COPD: -Still smoking a few cigarettes a day. Cessation advised -f/u w/ pulmonology    5. HLD: -On Atorvastatin -Management per Cardiology or PCP   6. DM II: -New diagnosis -A1c 6.9% in 05/23 -Continue Farxiga  F/u in 3 wks w/ APP for further med titration   Lyda Jester, PA-C

## 2022-06-19 ENCOUNTER — Telehealth: Payer: Self-pay

## 2022-06-19 DIAGNOSIS — Z0000 Encounter for general adult medical examination without abnormal findings: Secondary | ICD-10-CM

## 2022-06-19 NOTE — Telephone Encounter (Signed)
Patient was referred for smoking cessation per Eileen Stanford from the Heart Failure clinic. Called patient to offer health coaching. Patient is interested in health coaching and quitting smoking. Patient has been scheduled for his initial session on 9/6 at 9:45am. Patient will be called at this time.   Scheryl Sanborn Nedra Hai [facility] Guide, Health Coach [address], [address] [place] [address] Telephone: [phone] Email: [email or web address]

## 2022-06-23 ENCOUNTER — Ambulatory Visit (HOSPITAL_BASED_OUTPATIENT_CLINIC_OR_DEPARTMENT_OTHER): Payer: Medicare Other | Attending: Cardiology | Admitting: Cardiology

## 2022-06-23 DIAGNOSIS — R0683 Snoring: Secondary | ICD-10-CM | POA: Diagnosis not present

## 2022-06-23 DIAGNOSIS — G4733 Obstructive sleep apnea (adult) (pediatric): Secondary | ICD-10-CM

## 2022-06-24 ENCOUNTER — Telehealth: Payer: Self-pay

## 2022-06-24 ENCOUNTER — Ambulatory Visit: Payer: Medicare Other | Attending: Cardiology

## 2022-06-24 ENCOUNTER — Ambulatory Visit: Payer: Medicare Other

## 2022-06-24 DIAGNOSIS — Z0000 Encounter for general adult medical examination without abnormal findings: Secondary | ICD-10-CM

## 2022-06-24 NOTE — Telephone Encounter (Signed)
Called patient to hold session at rescheduled time as requested. Patient did not answer. Left message for patient to return call by 4:15pm to hold session today or to reschedule.    Giavonna Pflum Nedra Hai [facility] Guide, Health Coach [address] [place] [address] Telephone: [phone] Email: [email or web address]

## 2022-06-24 NOTE — Telephone Encounter (Signed)
Called patient for initial health coaching session. Patient requested a call back later today. Patient has been rescheduled for 2:15pm today and will be called at this time.    Andra Heslin Nedra Hai [facility] Guide, Health Coach [address], [address] [place] [address] Telephone: [phone] Email: [email or web address]

## 2022-06-25 ENCOUNTER — Other Ambulatory Visit (HOSPITAL_COMMUNITY): Payer: Medicare Other

## 2022-06-26 ENCOUNTER — Ambulatory Visit (HOSPITAL_COMMUNITY)
Admission: RE | Admit: 2022-06-26 | Discharge: 2022-06-26 | Disposition: A | Discharge status: Home / Self Care | Payer: Medicare Other | Source: Ambulatory Visit | Attending: Internal Medicine | Admitting: Internal Medicine

## 2022-06-26 DIAGNOSIS — I5020 Unspecified systolic (congestive) heart failure: Secondary | ICD-10-CM | POA: Insufficient documentation

## 2022-06-26 LAB — BASIC METABOLIC PANEL
Anion gap: 6 (ref 5–15)
BUN: 13 mg/dL (ref 8–23)
CO2: 25 mmol/L (ref 22–32)
Calcium: 8.5 mg/dL — ABNORMAL LOW (ref 8.9–10.3)
Chloride: 106 mmol/L (ref 98–111)
Creatinine, Ser: 1.35 mg/dL — ABNORMAL HIGH (ref 0.61–1.24)
GFR, Estimated: 56 mL/min — ABNORMAL LOW (ref >60)
Glucose, Bld: 99 mg/dL (ref 70–99)
Potassium: 3.9 mmol/L (ref 3.5–5.1)
Sodium: 137 mmol/L (ref 135–145)

## 2022-06-29 ENCOUNTER — Telehealth: Payer: Self-pay | Admitting: *Deleted

## 2022-06-29 ENCOUNTER — Telehealth (HOSPITAL_COMMUNITY): Payer: Self-pay | Admitting: Emergency Medicine

## 2022-06-29 NOTE — Telephone Encounter (Signed)
Called and left message for him to call back to schedule a home visit.  I will reach out to him again later today if I don't hear back.

## 2022-06-29 NOTE — Telephone Encounter (Signed)
Left message to return a call to discuss sleep study results and recommendations. 

## 2022-06-29 NOTE — Telephone Encounter (Signed)
-----   Message from Quintella Reichert, MD sent at 06/29/2022 11:56 AM EDT ----- Severe OSA with unsuccessful CPAP titration - refer back to lab for BiPAP titration

## 2022-06-29 NOTE — Procedures (Signed)
Patient Name: Marcus Tran, Danese Date: 06/23/2022 Gender: Male D.O.B: 01-29-1951 Age (years): 71 Referring Provider: Loel Dubonnet NP Height (inches): 71 Interpreting Physician: Fransico Him MD, ABSM Weight (lbs): 152 RPSGT: Carolin Coy BMI: 25 MRN: [id number] Neck Size: 16.50  CLINICAL INFORMATION Sleep Study Type: Split Night CPAP  Indication for sleep study: COPD, Diabetes, Fatigue, Hypertension, OSA, Snoring  Epworth Sleepiness Score: 14  SLEEP STUDY TECHNIQUE As per the AASM Manual for the Scoring of Sleep and Associated Events v2.3 (April 2016) with a hypopnea requiring 4% desaturations.  The channels recorded and monitored were frontal, central and occipital EEG, electrooculogram (EOG), submentalis EMG (chin), nasal and oral airflow, thoracic and abdominal wall motion, anterior tibialis EMG, snore microphone, electrocardiogram, and pulse oximetry. Continuous positive airway pressure (CPAP) was initiated when the patient met split night criteria and was titrated according to treat sleep-disordered breathing.  MEDICATIONS Medications self-administered by patient taken the night of the study : N/A  RESPIRATORY PARAMETERS Diagnostic Total AHI (/hr): 46.2  RDI (/hr):53.4 OA Index (/hr): 1.2  CA Index (/hr): 0.0 REM AHI (/hr): 67.8  NREM AHI (/hr):38.3  Supine AHI (/hr):36.4  Non-supine AHI (/hr):61.2 Min O2 Sat (%):72.0  Mean O2 (%):84.9  Time below 88% (min):136.5   Titration Optimal Pressure (cm):N/A  AHI at Optimal Pressure (/hr):N/A  Min O2 at Optimal Pressure (%):N/A Supine % at Optimal (%):N/A  Sleep % at Optimal (%):N/A   SLEEP ARCHITECTURE The recording time for the entire night was 355.4 minutes.  During a baseline period of 178.7 minutes, the patient slept for 150.5 minutes in REM and nonREM, yielding a sleep efficiency of 84.2%. Sleep onset after lights out was 18.7 minutes with a REM latency of 45.5 minutes. The patient spent 11.7% of the  night in stage N1 sleep, 60.1% in stage N2 sleep, 0.0% in stage N3 and 28.2% in REM.  During the titration period of 168.7 minutes, the patient slept for 153.5 minutes in REM and nonREM, yielding a sleep efficiency of 91.0%. Sleep onset after CPAP initiation was 5.5 minutes with a REM latency of 95.0 minutes. The patient spent 18.2% of the night in stage N1 sleep, 69.7% in stage N2 sleep, 0.0% in stage N3 and 12.1% in REM.  CARDIAC DATA The 2 lead EKG demonstrated sinus rhythm. The mean heart rate was 100.0 beats per minute. Other EKG findings include: PVCs.  LEG MOVEMENT DATA The total Periodic Limb Movements of Sleep (PLMS) were 0. The PLMS index was 0.0 .  IMPRESSIONS - Severe obstructive sleep apnea occurred during the diagnostic portion of the study (AHI = 46.2/hour). An optimal PAP pressure was selected for this patient ( 20 cm of water) - No significant central sleep apnea occurred during the diagnostic portion of the study (CAI = 0.0/hour). - Mild oxygen desaturation was noted during the diagnostic portion of the study (Min O2 = 72.0%). - The patient snored with moderate snoring volume during the diagnostic portion of the study. - EKG findings include PVCs. - Clinically significant periodic limb movements did not occur during sleep.  DIAGNOSIS - Obstructive Sleep Apnea (G47.33) - Nocturnal Hypoxemia  RECOMMENDATIONS - Recommend in lab BiPAP titration.  - Avoid alcohol, sedatives and other CNS depressants that may worsen sleep apnea and disrupt normal sleep architecture. - Sleep hygiene should be reviewed to assess factors that may improve sleep quality. - Weight management and regular exercise should be initiated or continued.  [Electronically signed] 06/29/2022 11:53 AM  Fransico Him MD, ABSM  Diplomate, Tax adviser of Sleep Medicine

## 2022-06-30 ENCOUNTER — Telehealth (HOSPITAL_COMMUNITY): Payer: Self-pay | Admitting: Emergency Medicine

## 2022-06-30 ENCOUNTER — Encounter (HOSPITAL_COMMUNITY): Payer: Self-pay

## 2022-06-30 NOTE — Telephone Encounter (Signed)
Called, no answer.  Left voicemail again attempting to schedule initial paramedicine home visit.

## 2022-07-06 NOTE — Telephone Encounter (Signed)
Left message to return a call to discuss sleep study results. 

## 2022-07-07 ENCOUNTER — Other Ambulatory Visit: Payer: Self-pay | Admitting: Cardiology

## 2022-07-07 DIAGNOSIS — G4736 Sleep related hypoventilation in conditions classified elsewhere: Secondary | ICD-10-CM

## 2022-07-07 DIAGNOSIS — G4733 Obstructive sleep apnea (adult) (pediatric): Secondary | ICD-10-CM

## 2022-07-07 NOTE — Telephone Encounter (Signed)
Called and notified patient of sleep study results and recommendations. Patient originally declined the study saying that he is not going to have "all of that stuff hooked up to me again." I told him that there may be some stuff, but maybe not as much. He was told that his sleep apnea is severe and needs to be treated. How sleep apnea can be a contributing factor to some of his health issues and if left untreated it can be very dangerous. The patient began to tell me about how tired he is of wearing the life vest and all of the other things he is having to do. How all of this is beginning to be too much. He has appointment to see Dr Lovena Le tomorrow for ICD consult. I recommended that he also talk to him about the importance of treating his OSA. Patient was told that the BIPAP study will be ordered per Dr Theodosia Blender recommendations.

## 2022-07-08 ENCOUNTER — Encounter: Payer: Self-pay | Admitting: Internal Medicine

## 2022-07-08 ENCOUNTER — Ambulatory Visit: Payer: Medicare Other | Attending: Internal Medicine | Admitting: Internal Medicine

## 2022-07-08 VITALS — BP 116/78 | HR 66 | Ht 65.0 in | Wt 155.0 lb

## 2022-07-08 DIAGNOSIS — I5043 Acute on chronic combined systolic (congestive) and diastolic (congestive) heart failure: Secondary | ICD-10-CM | POA: Diagnosis not present

## 2022-07-08 DIAGNOSIS — I2510 Atherosclerotic heart disease of native coronary artery without angina pectoris: Secondary | ICD-10-CM | POA: Diagnosis not present

## 2022-07-08 NOTE — Progress Notes (Signed)
HPI [name] is referred by Dr. Gwenlyn Tran for evaluation of PVC's, nocturnal bradycardia and LV dysfunction with a h/o syncope. He presented in 5/23 with brief syncope and was Tran to have an EF of 35%. He underwent left heart cath with no significant obstructive CAD. He has been placed on GDMT and his EF has improved. He has not had more syncope. He want to have his Life Vest removed. A 14 day monitor demonstrated NSR with nocturnal complete heart block, NSVT, and RBBB. He has tolerated his mediations. He denies sob but admits to not being particularly active.   No Known Allergies   Current Outpatient Medications  Medication Sig Dispense Refill   atorvastatin (LIPITOR) 80 MG tablet Take 1 tablet (80 mg total) by mouth every evening. (Patient taking differently: Take 80 mg by mouth daily.) 90 tablet 3   bisoprolol (ZEBETA) 5 MG tablet Take 1 tablet (5 mg total) by mouth daily. 30 tablet 3   budesonide-formoterol (SYMBICORT) 160-4.5 MCG/ACT inhaler Inhale 1 puff into the lungs 2 (two) times daily.     dapagliflozin propanediol (FARXIGA) 10 MG TABS tablet Take 1 tablet (10 mg total) by mouth daily. 30 tablet 5   furosemide (LASIX) 20 MG tablet Take by mouth.     ipratropium-albuterol (DUONEB) 0.5-2.5 (3) MG/3ML SOLN Inhale 3 mLs into the lungs in the morning, at noon, in the evening, and at bedtime. 360 mL 11   isosorbide-hydrALAZINE (BIDIL) 20-37.5 MG tablet Take 1 tablet by mouth 3 (three) times daily. 90 tablet 3   loratadine (ALLERGY RELIEF) 10 MG tablet Take 10 mg by mouth at bedtime.     montelukast (SINGULAIR) 10 MG tablet Take 10 mg by mouth at bedtime.     spironolactone (ALDACTONE) 25 MG tablet Take 0.5 tablets (12.5 mg total) by mouth daily. 15 tablet 3   albuterol (VENTOLIN HFA) 108 (90 Base) MCG/ACT inhaler Inhale 2 puffs into the lungs every 4 (four) hours as needed for wheezing or shortness of breath. (Patient not taking: Reported on 07/08/2022) 1 each 11   aspirin EC 81 MG  tablet Take 1 tablet (81 mg total) by mouth daily. Swallow whole. (Patient not taking: Reported on 07/08/2022) 90 tablet 3   Budeson-Glycopyrrol-Formoterol (BREZTRI AEROSPHERE) 160-9-4.8 MCG/ACT AERO Inhale 2 puffs into the lungs as needed. (Patient not taking: Reported on 07/08/2022) 10.6 g 11   metFORMIN (GLUCOPHAGE) 500 MG tablet Take 1 tablet (500 mg total) by mouth 2 (two) times daily with a meal. (Patient not taking: Reported on 07/08/2022) 60 tablet 0   nitroGLYCERIN (NITROSTAT) 0.4 MG SL tablet Place 1 tablet (0.4 mg total) under the tongue every 5 (five) minutes as needed for chest pain. (Patient not taking: Reported on 06/18/2022) 25 tablet 12   potassium chloride SA (KLOR-CON M) 20 MEQ tablet Take 1 tablet (20 mEq total) by mouth daily. (Patient not taking: Reported on 07/08/2022) 30 tablet 0   sacubitril-valsartan (ENTRESTO) 97-103 MG Take 1 tablet by mouth 2 (two) times daily. (Patient not taking: Reported on 07/08/2022) 180 tablet 3   No current facility-administered medications for this visit.     Past Medical History:  Diagnosis Date   Acute metabolic encephalopathy [id number]   Acute respiratory failure with hypoxia ([place]) 03/20/2022   Acute respiratory failure with hypoxia and hypercapnia (HCC) 03/06/2022   CHF (congestive heart failure) (HCC)    Dyslipidemia    Hypertension     ROS:   All systems reviewed and negative except  as noted in the HPI.   Past Surgical History:  Procedure Laterality Date   LEG SURGERY     Right-sided as result of trauma   RIGHT/LEFT HEART CATH AND CORONARY ANGIOGRAPHY N/A 03/09/2022   Procedure: RIGHT/LEFT HEART CATH AND CORONARY ANGIOGRAPHY;  Surgeon: Marcus Harp, MD;  Location: [place];  Service: Cardiovascular;  Laterality: N/A;   Umbilicus surgery     As a child     Family History  Problem Relation Age of Onset   Hypertension Mother      Social History   Socioeconomic History   Marital status: Single    Spouse name:  Not on file   Number of children: 1   Years of education: Not on file   Highest education level: 10th grade  Occupational History   Occupation: Disability  Tobacco Use   Smoking status: Former    Packs/day: 0.50    Years: 50.00    Total pack years: 25.00    Types: Cigarettes    Quit date: 03/19/2022    Years since quitting: 0.3   Smokeless tobacco: Never   Tobacco comments:    Smoking cessation  Vaping Use   Vaping Use: Never used  Substance and Sexual Activity   Alcohol use: Not on file    Comment: socially   Drug use: Yes    Types: Marijuana    Comment: past   Sexual activity: Not on file  Other Topics Concern   Not on file  Social History Narrative   He lives with his daughter apparently and 5 grandchildren.  He reports that he smokes probably less than a pack of cigarettes a day.  He occasionally drinks a beer or liquor but not daily.   Social Determinants of Health   Financial Resource Strain: Low Risk  (03/11/2022)   Overall Financial Resource Strain (CARDIA)    Difficulty of Paying Living Expenses: Not very hard  Food Insecurity: No Food Insecurity (05/06/2022)   Hunger Vital Sign    Worried About Running Out of Food in the Last Year: Never true    Ran Out of Food in the Last Year: Never true  Transportation Needs: No Transportation Needs (05/06/2022)   PRAPARE - Hydrologist (Medical): No    Lack of Transportation (Non-Medical): No  Physical Activity: Not on file  Stress: Not on file  Social Connections: Not on file  Intimate Partner Violence: Not on file     BP 116/78   Pulse 66   Ht 5\' 5"  (1.651 m)   Wt 155 lb (70.3 kg)   SpO2 92%   BMI 25.79 kg/m   Physical Exam:  Well appearing NAD HEENT: Unremarkable Neck:  No JVD, no thyromegally Lymphatics:  No adenopathy Back:  No CVA tenderness Lungs:  Clear with no wheezes HEART:  Regular rate rhythm, no murmurs, no rubs, no clicks Abd:  soft, positive bowel sounds, no  organomegally, no rebound, no guarding Ext:  2 plus pulses, no edema, no cyanosis, no clubbing Skin:  No rashes no nodules Neuro:  CN II through XII intact, motor grossly intact  EKG - nsr with RBBB  DEVICE  Normal device function.  See PaceArt for details.   Assess/Plan:  Syncope - he has not had any since getting on GDMT. If he has more, I would recommend insertion of an ILR. Chronic systolic heart failure - he has had improvement in his LV function. I would not recommend a  change in meds or an ICD at this point in time.  Life Vest - I have recommended he remove the vest and undergo watchful waiting.  Carleene Overlie Rieley Hausman,MD

## 2022-07-08 NOTE — Patient Instructions (Addendum)
Medication Instructions:  Your physician recommends that you continue on your current medications as directed. Please refer to the Current Medication list given to you today.  *If you need a refill on your cardiac medications before your next appointment, please call your pharmacy*  Lab Work: None ordered.  If you have labs (blood work) drawn today and your tests are completely normal, you will receive your results only by: MyChart Message (if you have MyChart) OR A paper copy in the mail If you have any lab test that is abnormal or we need to change your treatment, we will call you to review the results.  Testing/Procedures: None ordered.  Follow-Up: At [facility], you and your health needs are our priority.  As part of our continuing mission to provide you with exceptional heart care, we have created designated Provider Care Teams.  These Care Teams include your primary Cardiologist (physician) and Advanced Practice Providers (APPs -  Physician Assistants and Nurse Practitioners) who all work together to provide you with the care you need, when you need it.  We recommend signing up for the patient portal called "MyChart".  Sign up information is provided on this After Visit Summary.  MyChart is used to connect with patients for Virtual Visits (Telemedicine).  Patients are able to view lab/test results, encounter notes, upcoming appointments, etc.  Non-urgent messages can be sent to your provider as well.   To learn more about what you can do with MyChart, go to [email or web address].    Your next appointment:   PLEASE SCHEDULE A 6 MONTH FOLLOW UP APPOINTMENT WITH DR TAYLOR.   The format for your next appointment:   In Person  Provider:   Gregg Taylor, MD{or one of the following Advanced Practice Providers on your designated Care Team:   Renee Ursuy, PA-C Michael "Andy" Tillery, PA-C  Important Information About Sugar        

## 2022-07-09 ENCOUNTER — Other Ambulatory Visit (HOSPITAL_COMMUNITY): Payer: Self-pay

## 2022-07-09 ENCOUNTER — Other Ambulatory Visit (HOSPITAL_COMMUNITY): Payer: Self-pay | Admitting: Emergency Medicine

## 2022-07-09 DIAGNOSIS — E876 Hypokalemia: Secondary | ICD-10-CM

## 2022-07-09 DIAGNOSIS — I5020 Unspecified systolic (congestive) heart failure: Secondary | ICD-10-CM

## 2022-07-09 MED ORDER — FUROSEMIDE 40 MG PO TABS: 40.0000 mg | ORAL_TABLET | Freq: Two times a day (BID) | ORAL | 3 refills | Status: DC

## 2022-07-09 MED ORDER — ENTRESTO 97-103 MG PO TABS: 1.0000 | ORAL_TABLET | Freq: Two times a day (BID) | ORAL | 3 refills | Status: DC

## 2022-07-09 MED ORDER — POTASSIUM CHLORIDE CRYS ER 20 MEQ PO TBCR: 20.0000 meq | EXTENDED_RELEASE_TABLET | Freq: Every day | ORAL | 3 refills | Status: DC

## 2022-07-09 NOTE — Progress Notes (Signed)
Paramedicine Encounter    Patient ID: Marcus Tran, male    DOB: [birth year], 71 y.o.   MRN: [id number]   BP (!) 130/90 (BP Location: Right Arm, Patient Position: Sitting, Cuff Size: Normal)   Pulse 66   Resp 16   Wt 149 lb 12.8 oz (67.9 kg)   SpO2 96%   BMI 24.93 kg/m  Weight yesterday-not taken Last visit weight-not taken This was my initial home visit with Marcus Tran and it lasted 2.25hrs.  He was found A&O x 4, skin W&D w/ good color.  He reports to feeling well today.  He was authorized to discontinue wearing his Live Vest yesterday during his visit w/ Dr. Lovena Le.  Marcus Tran does not read very well at all.  His daughter has been helping him with is pill box but everything appeared to be in disarray and I could not tell what he had and had not been taking.  I deconstructed and reconstructed what was in pill box which wasn't very much.  There were some questions regarding what he was and was not taking and this was fielded through the [facility] triage.  Advised pt. Is to continue taking 40mg . Lasix BID, Entresto 97-103, & Pot Cl 20 mEq daily.  Pill box was reconciled to reflect same.  He did not have any Entresto in his meds and he doesn't know if he's been taking it or not.  Prescription called into [facility] which will deliver to him this afternoon.  He was instructed to put 1 tablet of Entresto 97-103 in AM & PM each day.  He verbalized that he understood same.   We sodium intake and I suggested that he not add salt to his food.  There was a huge information dump for him today and I did not want to overload him on the first visit.  Primary goal today was to get a handle on his medication regimen.  He seemed to agree with this approach and appeared to have the desire to be compliant with same.  Next visit sheduled 9/27 @4 :00. Home visit complete.    Renee Ramus, [place] 07/09/2022  Patient Care Team: Cipriano Mile, NP as PCP - Cheral Bay, MD as PCP -  Cardiology (Cardiology)  Patient Active Problem List   Diagnosis Date Noted   Acute respiratory failure with hypoxia ([place]) 05/04/2022   Third degree heart block ([place]) 04/15/2022   Snoring 04/15/2022   Nonischemic cardiomyopathy ([place]) 04/15/2022   HFrEF (heart failure with reduced ejection fraction) ([place]) 04/15/2022   COPD with acute exacerbation ([place]) 03/22/2022   Acute on chronic combined systolic and diastolic CHF (congestive heart failure) ([place])    Tobacco abuse 03/08/2022   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   HTN (hypertension) 03/08/2022   Stage 3a chronic kidney disease (CKD) ([place]) 03/08/2022   Coronary artery disease 03/08/2022    Current Outpatient Medications:    atorvastatin (LIPITOR) 80 MG tablet, Take 1 tablet (80 mg total) by mouth every evening. (Patient taking differently: Take 80 mg by mouth daily.), Disp: 90 tablet, Rfl: 3   bisoprolol (ZEBETA) 5 MG tablet, Take 1 tablet (5 mg total) by mouth daily., Disp: 30 tablet, Rfl: 3   budesonide-formoterol (SYMBICORT) 160-4.5 MCG/ACT inhaler, Inhale 1 puff into the lungs 2 (two) times daily., Disp: , Rfl:    dapagliflozin propanediol (FARXIGA) 10 MG TABS tablet, Take 1 tablet (10 mg total) by mouth daily., Disp: 30 tablet, Rfl: 5   furosemide (LASIX) 40  MG tablet, Take 1 tablet (40 mg total) by mouth 2 (two) times daily., Disp: 90 tablet, Rfl: 3   isosorbide-hydrALAZINE (BIDIL) 20-37.5 MG tablet, Take 1 tablet by mouth 3 (three) times daily., Disp: 90 tablet, Rfl: 3   loratadine (ALLERGY RELIEF) 10 MG tablet, Take 10 mg by mouth at bedtime., Disp: , Rfl:    montelukast (SINGULAIR) 10 MG tablet, Take 10 mg by mouth at bedtime., Disp: , Rfl:    potassium chloride SA (KLOR-CON M) 20 MEQ tablet, Take 1 tablet (20 mEq total) by mouth daily., Disp: 180 tablet, Rfl: 3   sacubitril-valsartan (ENTRESTO) 97-103 MG, Take 1 tablet by mouth 2 (two) times daily., Disp: 180 tablet, Rfl: 3   spironolactone (ALDACTONE) 25 MG tablet,  Take 0.5 tablets (12.5 mg total) by mouth daily., Disp: 15 tablet, Rfl: 3   albuterol (VENTOLIN HFA) 108 (90 Base) MCG/ACT inhaler, Inhale 2 puffs into the lungs every 4 (four) hours as needed for wheezing or shortness of breath. (Patient not taking: Reported on 07/08/2022), Disp: 1 each, Rfl: 11   aspirin EC 81 MG tablet, Take 1 tablet (81 mg total) by mouth daily. Swallow whole. (Patient not taking: Reported on 07/08/2022), Disp: 90 tablet, Rfl: 3   Budeson-Glycopyrrol-Formoterol (BREZTRI AEROSPHERE) 160-9-4.8 MCG/ACT AERO, Inhale 2 puffs into the lungs as needed. (Patient not taking: Reported on 07/08/2022), Disp: 10.6 g, Rfl: 11   ipratropium-albuterol (DUONEB) 0.5-2.5 (3) MG/3ML SOLN, Inhale 3 mLs into the lungs in the morning, at noon, in the evening, and at bedtime. (Patient not taking: Reported on 07/09/2022), Disp: 360 mL, Rfl: 11   metFORMIN (GLUCOPHAGE) 500 MG tablet, Take 1 tablet (500 mg total) by mouth 2 (two) times daily with a meal. (Patient not taking: Reported on 07/08/2022), Disp: 60 tablet, Rfl: 0   nitroGLYCERIN (NITROSTAT) 0.4 MG SL tablet, Place 1 tablet (0.4 mg total) under the tongue every 5 (five) minutes as needed for chest pain. (Patient not taking: Reported on 06/18/2022), Disp: 25 tablet, Rfl: 12 No Known Allergies    Social History   Socioeconomic History   Marital status: Single    Spouse name: Not on file   Number of children: 1   Years of education: Not on file   Highest education level: 10th grade  Occupational History   Occupation: Disability  Tobacco Use   Smoking status: Former    Packs/day: 0.50    Years: 50.00    Total pack years: 25.00    Types: Cigarettes    Quit date: 03/19/2022    Years since quitting: 0.3   Smokeless tobacco: Never   Tobacco comments:    Smoking cessation  Vaping Use   Vaping Use: Never used  Substance and Sexual Activity   Alcohol use: Not on file    Comment: socially   Drug use: Yes    Types: Marijuana    Comment: past    Sexual activity: Not on file  Other Topics Concern   Not on file  Social History Narrative   He lives with his daughter apparently and 5 grandchildren.  He reports that he smokes probably less than a pack of cigarettes a day.  He occasionally drinks a beer or liquor but not daily.   Social Determinants of Health   Financial Resource Strain: Low Risk  (03/11/2022)   Overall Financial Resource Strain (CARDIA)    Difficulty of Paying Living Expenses: Not very hard  Food Insecurity: No Food Insecurity (05/06/2022)   Hunger Vital Sign  Worried About Charity fundraiser in the Last Year: Never true    [place] in the Last Year: Never true  Transportation Needs: No Transportation Needs (05/06/2022)   PRAPARE - Hydrologist (Medical): No    Lack of Transportation (Non-Medical): No  Physical Activity: Not on file  Stress: Not on file  Social Connections: Not on file  Intimate Partner Violence: Not on file    Physical Exam      Future Appointments  Date Time Provider [place]  07/10/2022  3:30 PM [facility] PA/NP [facility] None  08/10/2022 12:20 PM Minus Breeding, MD [facility] None  12/31/2022 11:30 AM Evans Lance, MD [facility]       Renee Ramus, [place] [facility] Paramedic  07/09/22

## 2022-07-10 ENCOUNTER — Encounter (HOSPITAL_COMMUNITY): Payer: Medicare Other

## 2022-07-15 ENCOUNTER — Other Ambulatory Visit (HOSPITAL_COMMUNITY): Payer: Self-pay | Admitting: Emergency Medicine

## 2022-07-15 NOTE — Progress Notes (Signed)
Paramedicine Encounter    Patient ID: Marcus Tran, male    DOB: 1951-09-03, 71 y.o.   MRN: [id number]   BP 120/80 (BP Location: Right Arm, Patient Position: Sitting, Cuff Size: Normal)   Pulse 67   Resp 16   Wt 152 lb 6.4 oz (69.1 kg)   SpO2 99%   BMI 25.36 kg/m  Weight yesterday-not taken Last visit weight-149lb  [name] A&O x 4, skin W&D w/ good color.  Pt denies chest pain or SOB.  No edema noted.  Lung sounds with some mild expiratory wheezing in the upper lobes bilat.  His nebulizer machine is broken so he has been unable to given himself his prescribed DuoNeb.  I assisted him with this using my oxygen tank.  Wheezing resolved with breathing treatment.  I will follow up with his pulmonologist to see what resource I need to reach out to to get him a replacement machine.  Marcus Tran has done very well with his med compliance in his pill box.  Box reconciled x 1 week.  Refill called into [facility] for Bisoprolol and will be delivered to pt on Monday.  Home visit complete.    Renee Ramus, [place] 07/15/2022  Patient Care Team: Cipriano Mile, NP as PCP - Cheral Bay, MD as PCP - Cardiology (Cardiology)  Patient Active Problem List   Diagnosis Date Noted   Acute respiratory failure with hypoxia ([place]) 05/04/2022   Third degree heart block ([place]) 04/15/2022   Snoring 04/15/2022   Nonischemic cardiomyopathy ([place]) 04/15/2022   HFrEF (heart failure with reduced ejection fraction) ([place]) 04/15/2022   COPD with acute exacerbation ([place]) 03/22/2022   Acute on chronic combined systolic and diastolic CHF (congestive heart failure) ([place])    Tobacco abuse 03/08/2022   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   HTN (hypertension) 03/08/2022   Stage 3a chronic kidney disease (CKD) ([place]) 03/08/2022   Coronary artery disease 03/08/2022    Current Outpatient Medications:    atorvastatin (LIPITOR) 80 MG tablet, Take 1 tablet (80 mg total) by mouth  every evening. (Patient taking differently: Take 80 mg by mouth daily.), Disp: 90 tablet, Rfl: 3   bisoprolol (ZEBETA) 5 MG tablet, Take 1 tablet (5 mg total) by mouth daily., Disp: 30 tablet, Rfl: 3   dapagliflozin propanediol (FARXIGA) 10 MG TABS tablet, Take 1 tablet (10 mg total) by mouth daily., Disp: 30 tablet, Rfl: 5   furosemide (LASIX) 40 MG tablet, Take 1 tablet (40 mg total) by mouth 2 (two) times daily., Disp: 90 tablet, Rfl: 3   isosorbide-hydrALAZINE (BIDIL) 20-37.5 MG tablet, Take 1 tablet by mouth 3 (three) times daily., Disp: 90 tablet, Rfl: 3   loratadine (ALLERGY RELIEF) 10 MG tablet, Take 10 mg by mouth at bedtime., Disp: , Rfl:    montelukast (SINGULAIR) 10 MG tablet, Take 10 mg by mouth at bedtime., Disp: , Rfl:    potassium chloride SA (KLOR-CON M) 20 MEQ tablet, Take 1 tablet (20 mEq total) by mouth daily., Disp: 180 tablet, Rfl: 3   sacubitril-valsartan (ENTRESTO) 97-103 MG, Take 1 tablet by mouth 2 (two) times daily., Disp: 180 tablet, Rfl: 3   spironolactone (ALDACTONE) 25 MG tablet, Take 0.5 tablets (12.5 mg total) by mouth daily., Disp: 15 tablet, Rfl: 3   albuterol (VENTOLIN HFA) 108 (90 Base) MCG/ACT inhaler, Inhale 2 puffs into the lungs every 4 (four) hours as needed for wheezing or shortness of breath. (Patient not taking: Reported on 07/08/2022), Disp: 1  each, Rfl: 11   aspirin EC 81 MG tablet, Take 1 tablet (81 mg total) by mouth daily. Swallow whole. (Patient not taking: Reported on 07/08/2022), Disp: 90 tablet, Rfl: 3   Budeson-Glycopyrrol-Formoterol (BREZTRI AEROSPHERE) 160-9-4.8 MCG/ACT AERO, Inhale 2 puffs into the lungs as needed. (Patient not taking: Reported on 07/08/2022), Disp: 10.6 g, Rfl: 11   budesonide-formoterol (SYMBICORT) 160-4.5 MCG/ACT inhaler, Inhale 1 puff into the lungs 2 (two) times daily. (Patient not taking: Reported on 07/15/2022), Disp: , Rfl:    ipratropium-albuterol (DUONEB) 0.5-2.5 (3) MG/3ML SOLN, Inhale 3 mLs into the lungs in the morning,  at noon, in the evening, and at bedtime. (Patient not taking: Reported on 07/09/2022), Disp: 360 mL, Rfl: 11   metFORMIN (GLUCOPHAGE) 500 MG tablet, Take 1 tablet (500 mg total) by mouth 2 (two) times daily with a meal. (Patient not taking: Reported on 07/08/2022), Disp: 60 tablet, Rfl: 0   nitroGLYCERIN (NITROSTAT) 0.4 MG SL tablet, Place 1 tablet (0.4 mg total) under the tongue every 5 (five) minutes as needed for chest pain. (Patient not taking: Reported on 06/18/2022), Disp: 25 tablet, Rfl: 12 No Known Allergies    Social History   Socioeconomic History   Marital status: Single    Spouse name: Not on file   Number of children: 1   Years of education: Not on file   Highest education level: 10th grade  Occupational History   Occupation: Disability  Tobacco Use   Smoking status: Former    Packs/day: 0.50    Years: 50.00    Total pack years: 25.00    Types: Cigarettes    Quit date: 03/19/2022    Years since quitting: 0.3   Smokeless tobacco: Never   Tobacco comments:    Smoking cessation  Vaping Use   Vaping Use: Never used  Substance and Sexual Activity   Alcohol use: Not on file    Comment: socially   Drug use: Yes    Types: Marijuana    Comment: past   Sexual activity: Not on file  Other Topics Concern   Not on file  Social History Narrative   He lives with his daughter apparently and 5 grandchildren.  He reports that he smokes probably less than a pack of cigarettes a day.  He occasionally drinks a beer or liquor but not daily.   Social Determinants of Health   Financial Resource Strain: Low Risk  (03/11/2022)   Overall Financial Resource Strain (CARDIA)    Difficulty of Paying Living Expenses: Not very hard  Food Insecurity: No Food Insecurity (05/06/2022)   Hunger Vital Sign    Worried About Running Out of Food in the Last Year: Never true    Ran Out of Food in the Last Year: Never true  Transportation Needs: No Transportation Needs (05/06/2022)   PRAPARE -  Hydrologist (Medical): No    Lack of Transportation (Non-Medical): No  Physical Activity: Not on file  Stress: Not on file  Social Connections: Not on file  Intimate Partner Violence: Not on file    Physical Exam      Future Appointments  Date Time Provider [place]  08/10/2022 12:20 PM Minus Breeding, MD [facility] None  12/31/2022 11:30 AM Evans Lance, MD [facility]       Renee Ramus, [place] [facility] Paramedic  07/15/22

## 2022-07-16 ENCOUNTER — Telehealth: Payer: Self-pay

## 2022-07-16 DIAGNOSIS — R062 Wheezing: Secondary | ICD-10-CM

## 2022-07-16 DIAGNOSIS — R0609 Other forms of dyspnea: Secondary | ICD-10-CM

## 2022-07-16 NOTE — Telephone Encounter (Signed)
-----   Message from Lanier Clam, MD sent at 07/15/2022  4:30 PM EDT ----- Regarding: RE: [employer] Hey!  Thanks for letting me know. I have copied Gianni Mihalik to assist with new DME order. We will place a new order. Thanks!  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  Mr. [name] has advised his nebuilizer machine is broken.  The tip where the tubing would attach has broken off.  Could you possibly advise who I need to call to get a replacement for him:?  Thank you for your assistance.    Renee Ramus, [place] 07/15/2022

## 2022-07-16 NOTE — Telephone Encounter (Signed)
Order has been placed.

## 2022-07-22 ENCOUNTER — Other Ambulatory Visit (HOSPITAL_COMMUNITY): Payer: Self-pay | Admitting: Emergency Medicine

## 2022-07-22 NOTE — Progress Notes (Signed)
Paramedicine Encounter    Patient ID: Marcus Tran, male    DOB: 04-01-1951, 71 y.o.   MRN: [id number]   BP 100/80 (BP Location: Right Arm, Patient Position: Sitting, Cuff Size: Normal)   Pulse 74   Resp 16   Wt 153 lb 3.2 oz (69.5 kg)   SpO2 95%   BMI 25.49 kg/m  Weight yesterday-not taken Last visit weight-152lb  [name] A&O x 4, skin W&D w/ good color.  Pt denies chest pain or SOB.  His lung sounds with ronchi and expiratory wheezing bilat.  He reports to have a productive cough with green colored sputum.  He complains of coughing a lot especially at night.  His neb machine is broken but I have provided him with my O2 to nebulize his DuoNeb during my visit.  He states this provides much relief with his breathing.  I did encourage him to seek medical evaluation at his PCP or urgent care due to the potential of infection with the discolored sputum.  I also reached out to his Pulmonologist to seek assistance in getting him a new neb machine as his is broken. Marcus Tran has done very well with his medications compliance.  In calling in refills to [facility] I was advised that they (the pharmacy) were filling his Carvedilol.  This medication was discontinued on 05/19/22 but the pharmacy had not been notified of same.  I reached back out to the pharmacy to advise them that Marcus Tran no longer needed this medication and they advised they would remove it from his list.   Med box reconciled without incident.  Called in refills for his Marcelline Deist and [employer] inhaler.  This will be delivered to pt tomorrow. Home visit complete.    Beatrix Shipper, EMT-Paramedic [phone] 07/22/2022  Patient Care Team: Hillery Aldo, NP as PCP - Reatha Armour, MD as PCP - Cardiology (Cardiology)  Patient Active Problem List   Diagnosis Date Noted   Acute respiratory failure with hypoxia (HCC) 05/04/2022   Third degree heart block (HCC) 04/15/2022   Snoring 04/15/2022   Nonischemic cardiomyopathy  (HCC) 04/15/2022   HFrEF (heart failure with reduced ejection fraction) (HCC) 04/15/2022   COPD with acute exacerbation (HCC) 03/22/2022   Acute on chronic combined systolic and diastolic CHF (congestive heart failure) (HCC)    Tobacco abuse 03/08/2022   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   HTN (hypertension) 03/08/2022   Stage 3a chronic kidney disease (CKD) (HCC) 03/08/2022   Coronary artery disease 03/08/2022    Current Outpatient Medications:    atorvastatin (LIPITOR) 80 MG tablet, Take 1 tablet (80 mg total) by mouth every evening. (Patient taking differently: Take 80 mg by mouth daily.), Disp: 90 tablet, Rfl: 3   bisoprolol (ZEBETA) 5 MG tablet, Take 1 tablet (5 mg total) by mouth daily., Disp: 30 tablet, Rfl: 3   Budeson-Glycopyrrol-Formoterol (BREZTRI AEROSPHERE) 160-9-4.8 MCG/ACT AERO, Inhale 2 puffs into the lungs as needed., Disp: 10.6 g, Rfl: 11   dapagliflozin propanediol (FARXIGA) 10 MG TABS tablet, Take 1 tablet (10 mg total) by mouth daily., Disp: 30 tablet, Rfl: 5   furosemide (LASIX) 40 MG tablet, Take 1 tablet (40 mg total) by mouth 2 (two) times daily., Disp: 90 tablet, Rfl: 3   isosorbide-hydrALAZINE (BIDIL) 20-37.5 MG tablet, Take 1 tablet by mouth 3 (three) times daily., Disp: 90 tablet, Rfl: 3   loratadine (ALLERGY RELIEF) 10 MG tablet, Take 10 mg by mouth at bedtime., Disp: , Rfl:  montelukast (SINGULAIR) 10 MG tablet, Take 10 mg by mouth at bedtime., Disp: , Rfl:    potassium chloride SA (KLOR-CON M) 20 MEQ tablet, Take 1 tablet (20 mEq total) by mouth daily., Disp: 180 tablet, Rfl: 3   sacubitril-valsartan (ENTRESTO) 97-103 MG, Take 1 tablet by mouth 2 (two) times daily., Disp: 180 tablet, Rfl: 3   spironolactone (ALDACTONE) 25 MG tablet, Take 0.5 tablets (12.5 mg total) by mouth daily., Disp: 15 tablet, Rfl: 3   albuterol (VENTOLIN HFA) 108 (90 Base) MCG/ACT inhaler, Inhale 2 puffs into the lungs every 4 (four) hours as needed for wheezing or shortness  of breath. (Patient not taking: Reported on 07/08/2022), Disp: 1 each, Rfl: 11   aspirin EC 81 MG tablet, Take 1 tablet (81 mg total) by mouth daily. Swallow whole. (Patient not taking: Reported on 07/08/2022), Disp: 90 tablet, Rfl: 3   budesonide-formoterol (SYMBICORT) 160-4.5 MCG/ACT inhaler, Inhale 1 puff into the lungs 2 (two) times daily. (Patient not taking: Reported on 07/22/2022), Disp: , Rfl:    ipratropium-albuterol (DUONEB) 0.5-2.5 (3) MG/3ML SOLN, Inhale 3 mLs into the lungs in the morning, at noon, in the evening, and at bedtime. (Patient not taking: Reported on 07/09/2022), Disp: 360 mL, Rfl: 11   metFORMIN (GLUCOPHAGE) 500 MG tablet, Take 1 tablet (500 mg total) by mouth 2 (two) times daily with a meal. (Patient not taking: Reported on 07/08/2022), Disp: 60 tablet, Rfl: 0   nitroGLYCERIN (NITROSTAT) 0.4 MG SL tablet, Place 1 tablet (0.4 mg total) under the tongue every 5 (five) minutes as needed for chest pain. (Patient not taking: Reported on 06/18/2022), Disp: 25 tablet, Rfl: 12 No Known Allergies    Social History   Socioeconomic History   Marital status: Single    Spouse name: Not on file   Number of children: 1   Years of education: Not on file   Highest education level: 10th grade  Occupational History   Occupation: Disability  Tobacco Use   Smoking status: Former    Packs/day: 0.50    Years: 50.00    Total pack years: 25.00    Types: Cigarettes    Quit date: 03/19/2022    Years since quitting: 0.3   Smokeless tobacco: Never   Tobacco comments:    Smoking cessation  Vaping Use   Vaping Use: Never used  Substance and Sexual Activity   Alcohol use: Not on file    Comment: socially   Drug use: Yes    Types: Marijuana    Comment: past   Sexual activity: Not on file  Other Topics Concern   Not on file  Social History Narrative   He lives with his daughter apparently and 5 grandchildren.  He reports that he smokes probably less than a pack of cigarettes a day.  He  occasionally drinks a beer or liquor but not daily.   Social Determinants of Health   Financial Resource Strain: Low Risk  (03/11/2022)   Overall Financial Resource Strain (CARDIA)    Difficulty of Paying Living Expenses: Not very hard  Food Insecurity: No Food Insecurity (05/06/2022)   Hunger Vital Sign    Worried About Running Out of Food in the Last Year: Never true    Ran Out of Food in the Last Year: Never true  Transportation Needs: No Transportation Needs (05/06/2022)   PRAPARE - Hydrologist (Medical): No    Lack of Transportation (Non-Medical): No  Physical Activity: Not on file  Stress: Not on file  Social Connections: Not on file  Intimate Partner Violence: Not on file    Physical Exam      Future Appointments  Date Time Provider [place]  07/29/2022  1:30 PM [place] None  08/10/2022 12:20 PM Minus Breeding, MD [facility] None  12/31/2022 11:30 AM Evans Lance, MD [facility]       Renee Ramus, [place] [facility] Paramedic  07/22/22

## 2022-07-29 ENCOUNTER — Encounter (HOSPITAL_COMMUNITY): Payer: Medicare Other

## 2022-07-29 ENCOUNTER — Other Ambulatory Visit (HOSPITAL_COMMUNITY): Payer: Self-pay | Admitting: Emergency Medicine

## 2022-07-29 NOTE — Progress Notes (Signed)
Paramedicine Encounter    Patient ID: Marcus Tran, male    DOB: [birth year], 71 y.o.   MRN: [id number]   BP 120/80 (BP Location: Right Arm, Patient Position: Sitting)   Pulse 73   Wt 150 lb 12.8 oz (68.4 kg)   SpO2 99%   BMI 25.09 kg/m  Weight yesterday-not taken Last visit weight-153lb  ATF Mr. [place]atherston A&O skin W&D w/ good color.  Pt. Denies chest pain but is obviously SOB as he was during my last visit.  He has had a productive cough which he says is improving but not gone.  Lung sounds with wheezing and ronchi throughout.  No edema noted in his lower extremities and no obvious JVD.  He has been compliant with all medications.  While I was reconciling his med box I let him use my O2 tank to neb in a DuoNeb.  Lung sounds improved w/ treatment but were not completely resolved.  He did say he felt better.  I am coordinating replacing his nebulizer machine  as the one he has is broken and is still under warranty.  I was able to make this happen later today and dropped the new nebulizer off to his home around 5:30. Visit complete.  Patient Care Team: Cipriano Mile, NP as PCP - Cheral Bay, MD as PCP - Cardiology (Cardiology)  Patient Active Problem List   Diagnosis Date Noted   Acute respiratory failure with hypoxia ([place]) 05/04/2022   Third degree heart block ([place]) 04/15/2022   Snoring 04/15/2022   Nonischemic cardiomyopathy ([place]) 04/15/2022   HFrEF (heart failure with reduced ejection fraction) ([place]) 04/15/2022   COPD with acute exacerbation ([place]) 03/22/2022   Acute on chronic combined systolic and diastolic CHF (congestive heart failure) ([place])    Tobacco abuse 03/08/2022   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   HTN (hypertension) 03/08/2022   Stage 3a chronic kidney disease (CKD) ([place]) 03/08/2022   Coronary artery disease 03/08/2022    Current Outpatient Medications:    aspirin EC 81 MG tablet, Take 1 tablet (81 mg total) by mouth daily. Swallow whole., Disp:  90 tablet, Rfl: 3   atorvastatin (LIPITOR) 80 MG tablet, Take 1 tablet (80 mg total) by mouth every evening. (Patient taking differently: Take 80 mg by mouth daily.), Disp: 90 tablet, Rfl: 3   bisoprolol (ZEBETA) 5 MG tablet, Take 1 tablet (5 mg total) by mouth daily., Disp: 30 tablet, Rfl: 3   Budeson-Glycopyrrol-Formoterol (BREZTRI AEROSPHERE) 160-9-4.8 MCG/ACT AERO, Inhale 2 puffs into the lungs as needed., Disp: 10.6 g, Rfl: 11   dapagliflozin propanediol (FARXIGA) 10 MG TABS tablet, Take 1 tablet (10 mg total) by mouth daily., Disp: 30 tablet, Rfl: 5   furosemide (LASIX) 40 MG tablet, Take 1 tablet (40 mg total) by mouth 2 (two) times daily., Disp: 90 tablet, Rfl: 3   isosorbide-hydrALAZINE (BIDIL) 20-37.5 MG tablet, Take 1 tablet by mouth 3 (three) times daily., Disp: 90 tablet, Rfl: 3   loratadine (ALLERGY RELIEF) 10 MG tablet, Take 10 mg by mouth at bedtime., Disp: , Rfl:    montelukast (SINGULAIR) 10 MG tablet, Take 10 mg by mouth at bedtime., Disp: , Rfl:    potassium chloride SA (KLOR-CON M) 20 MEQ tablet, Take 1 tablet (20 mEq total) by mouth daily., Disp: 180 tablet, Rfl: 3   sacubitril-valsartan (ENTRESTO) 97-103 MG, Take 1 tablet by mouth 2 (two) times daily., Disp: 180 tablet, Rfl: 3   albuterol (VENTOLIN HFA) 108 (90 Base) MCG/ACT inhaler, Inhale  2 puffs into the lungs every 4 (four) hours as needed for wheezing or shortness of breath. (Patient not taking: Reported on 07/08/2022), Disp: 1 each, Rfl: 11   budesonide-formoterol (SYMBICORT) 160-4.5 MCG/ACT inhaler, Inhale 1 puff into the lungs 2 (two) times daily. (Patient not taking: Reported on 07/29/2022), Disp: , Rfl:    ipratropium-albuterol (DUONEB) 0.5-2.5 (3) MG/3ML SOLN, Inhale 3 mLs into the lungs in the morning, at noon, in the evening, and at bedtime. (Patient not taking: Reported on 07/09/2022), Disp: 360 mL, Rfl: 11   metFORMIN (GLUCOPHAGE) 500 MG tablet, Take 1 tablet (500 mg total) by mouth 2 (two) times daily with a meal.  (Patient not taking: Reported on 07/08/2022), Disp: 60 tablet, Rfl: 0   nitroGLYCERIN (NITROSTAT) 0.4 MG SL tablet, Place 1 tablet (0.4 mg total) under the tongue every 5 (five) minutes as needed for chest pain. (Patient not taking: Reported on 06/18/2022), Disp: 25 tablet, Rfl: 12   spironolactone (ALDACTONE) 25 MG tablet, Take 0.5 tablets (12.5 mg total) by mouth daily., Disp: 15 tablet, Rfl: 3 No Known Allergies    Social History   Socioeconomic History   Marital status: Single    Spouse name: Not on file   Number of children: 1   Years of education: Not on file   Highest education level: 10th grade  Occupational History   Occupation: Disability  Tobacco Use   Smoking status: Former    Packs/day: 0.50    Years: 50.00    Total pack years: 25.00    Types: Cigarettes    Quit date: 03/19/2022    Years since quitting: 0.3   Smokeless tobacco: Never   Tobacco comments:    Smoking cessation  Vaping Use   Vaping Use: Never used  Substance and Sexual Activity   Alcohol use: Not on file    Comment: socially   Drug use: Yes    Types: Marijuana    Comment: past   Sexual activity: Not on file  Other Topics Concern   Not on file  Social History Narrative   He lives with his daughter apparently and 5 grandchildren.  He reports that he smokes probably less than a pack of cigarettes a day.  He occasionally drinks a beer or liquor but not daily.   Social Determinants of Health   Financial Resource Strain: Low Risk  (03/11/2022)   Overall Financial Resource Strain (CARDIA)    Difficulty of Paying Living Expenses: Not very hard  Food Insecurity: No Food Insecurity (05/06/2022)   Hunger Vital Sign    Worried About Running Out of Food in the Last Year: Never true    Ran Out of Food in the Last Year: Never true  Transportation Needs: No Transportation Needs (05/06/2022)   PRAPARE - Hydrologist (Medical): No    Lack of Transportation (Non-Medical): No  Physical  Activity: Not on file  Stress: Not on file  Social Connections: Not on file  Intimate Partner Violence: Not on file    Physical Exam      Future Appointments  Date Time Provider [place]  08/10/2022 12:20 PM Minus Breeding, MD [facility] None  12/31/2022 11:30 AM Evans Lance, MD [facility]       Renee Ramus, [place] [facility] Paramedic  07/29/22

## 2022-08-05 ENCOUNTER — Other Ambulatory Visit (HOSPITAL_COMMUNITY): Payer: Self-pay | Admitting: Emergency Medicine

## 2022-08-05 ENCOUNTER — Other Ambulatory Visit (HOSPITAL_COMMUNITY): Payer: Self-pay | Admitting: Cardiology

## 2022-08-05 NOTE — Progress Notes (Signed)
Paramedicine Encounter    Patient ID: Marcus Tran, male    DOB: January 23, 1951, 71 y.o.   MRN: [id number]   There were no vitals taken for this visit. Weight yesterday-*** Last visit weight-***  Patient Care Team: Cipriano Mile, NP as PCP - General Minus Breeding, MD as PCP - Cardiology (Cardiology)  Patient Active Problem List   Diagnosis Date Noted   Acute respiratory failure with hypoxia ([place]) 05/04/2022   Third degree heart block ([place]) 04/15/2022   Snoring 04/15/2022   Nonischemic cardiomyopathy ([place]) 04/15/2022   HFrEF (heart failure with reduced ejection fraction) ([place]) 04/15/2022   COPD with acute exacerbation ([place]) 03/22/2022   Acute on chronic combined systolic and diastolic CHF (congestive heart failure) ([place])    Tobacco abuse 03/08/2022   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   HTN (hypertension) 03/08/2022   Stage 3a chronic kidney disease (CKD) ([place]) 03/08/2022   Coronary artery disease 03/08/2022    Current Outpatient Medications:    atorvastatin (LIPITOR) 80 MG tablet, Take 1 tablet (80 mg total) by mouth every evening. (Patient taking differently: Take 80 mg by mouth daily.), Disp: 90 tablet, Rfl: 3   bisoprolol (ZEBETA) 5 MG tablet, Take 1 tablet (5 mg total) by mouth daily., Disp: 30 tablet, Rfl: 3   Budeson-Glycopyrrol-Formoterol (BREZTRI AEROSPHERE) 160-9-4.8 MCG/ACT AERO, Inhale 2 puffs into the lungs as needed., Disp: 10.6 g, Rfl: 11   budesonide-formoterol (SYMBICORT) 160-4.5 MCG/ACT inhaler, Inhale 1 puff into the lungs 2 (two) times daily., Disp: , Rfl:    dapagliflozin propanediol (FARXIGA) 10 MG TABS tablet, Take 1 tablet (10 mg total) by mouth daily., Disp: 30 tablet, Rfl: 5   furosemide (LASIX) 40 MG tablet, Take 1 tablet (40 mg total) by mouth 2 (two) times daily., Disp: 90 tablet, Rfl: 3   ipratropium-albuterol (DUONEB) 0.5-2.5 (3) MG/3ML SOLN, Inhale 3 mLs into the lungs in the morning, at noon, in the evening, and at bedtime., Disp: 360 mL,  Rfl: 11   isosorbide-hydrALAZINE (BIDIL) 20-37.5 MG tablet, Take 1 tablet by mouth 3 (three) times daily., Disp: 90 tablet, Rfl: 3   loratadine (ALLERGY RELIEF) 10 MG tablet, Take 10 mg by mouth at bedtime., Disp: , Rfl:    montelukast (SINGULAIR) 10 MG tablet, Take 10 mg by mouth at bedtime., Disp: , Rfl:    potassium chloride SA (KLOR-CON M) 20 MEQ tablet, Take 1 tablet (20 mEq total) by mouth daily., Disp: 180 tablet, Rfl: 3   sacubitril-valsartan (ENTRESTO) 97-103 MG, Take 1 tablet by mouth 2 (two) times daily., Disp: 180 tablet, Rfl: 3   spironolactone (ALDACTONE) 25 MG tablet, Take 0.5 tablets (12.5 mg total) by mouth daily., Disp: 15 tablet, Rfl: 3   albuterol (VENTOLIN HFA) 108 (90 Base) MCG/ACT inhaler, Inhale 2 puffs into the lungs every 4 (four) hours as needed for wheezing or shortness of breath. (Patient not taking: Reported on 07/08/2022), Disp: 1 each, Rfl: 11   aspirin EC 81 MG tablet, Take 1 tablet (81 mg total) by mouth daily. Swallow whole., Disp: 90 tablet, Rfl: 3   metFORMIN (GLUCOPHAGE) 500 MG tablet, Take 1 tablet (500 mg total) by mouth 2 (two) times daily with a meal. (Patient not taking: Reported on 07/08/2022), Disp: 60 tablet, Rfl: 0   nitroGLYCERIN (NITROSTAT) 0.4 MG SL tablet, Place 1 tablet (0.4 mg total) under the tongue every 5 (five) minutes as needed for chest pain. (Patient not taking: Reported on 06/18/2022), Disp: 25 tablet, Rfl: 12 No Known Allergies  Social History   Socioeconomic History   Marital status: Single    Spouse name: Not on file   Number of children: 1   Years of education: Not on file   Highest education level: 10th grade  Occupational History   Occupation: Disability  Tobacco Use   Smoking status: Former    Packs/day: 0.50    Years: 50.00    Total pack years: 25.00    Types: Cigarettes    Quit date: 03/19/2022    Years since quitting: 0.3   Smokeless tobacco: Never   Tobacco comments:    Smoking cessation  Vaping Use   Vaping  Use: Never used  Substance and Sexual Activity   Alcohol use: Not on file    Comment: socially   Drug use: Yes    Types: Marijuana    Comment: past   Sexual activity: Not on file  Other Topics Concern   Not on file  Social History Narrative   He lives with his daughter apparently and 5 grandchildren.  He reports that he smokes probably less than a pack of cigarettes a day.  He occasionally drinks a beer or liquor but not daily.   Social Determinants of Health   Financial Resource Strain: Low Risk  (03/11/2022)   Overall Financial Resource Strain (CARDIA)    Difficulty of Paying Living Expenses: Not very hard  Food Insecurity: No Food Insecurity (05/06/2022)   Hunger Vital Sign    Worried About Running Out of Food in the Last Year: Never true    Ran Out of Food in the Last Year: Never true  Transportation Needs: No Transportation Needs (05/06/2022)   PRAPARE - Hydrologist (Medical): No    Lack of Transportation (Non-Medical): No  Physical Activity: Not on file  Stress: Not on file  Social Connections: Not on file  Intimate Partner Violence: Not on file    Physical Exam      Future Appointments  Date Time Provider [place]  08/10/2022 12:20 PM Minus Breeding, MD [facility] None  12/31/2022 11:30 AM Evans Lance, MD [facility]       Renee Ramus, [facility] Paramedic  08/05/22.co

## 2022-08-08 DIAGNOSIS — G4733 Obstructive sleep apnea (adult) (pediatric): Secondary | ICD-10-CM | POA: Insufficient documentation

## 2022-08-08 NOTE — Progress Notes (Unsigned)
Cardiology Office Note   Date:  08/10/2022   ID:  [name], [name] 05/26/1951, MRN [id number]  PCP:  Hillery Aldo, NP  Cardiologist:   Rollene Rotunda, MD Referring:  Hillery Aldo, NP   Chief Complaint  Patient presents with   Cough      History of Present Illness: Marcus Tran is a 71 y.o. 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  He was admitted in May with acute hypoxic respiratory failure and COPD.  He had an echo with an ejection fraction of 35%.  Right ventricular pressure was estimated be 54.  Coronary cath demonstrated nonobstructive coronary disease.  He had IV diuresis and his discharge weight was 163 pounds.  He was readmitted earlier this month with COPD exacerbation and some component of acute on chronic heart failure.  He was diuresed down to 151 pounds.  He has been seen in the [facility] clinic.  Sherryll Burger was increased most recently.  He did wear a monitor that I reviewed and it demonstrated predominantly sinus rhythm.  However, he had runs of ventricular tachycardia with the longest being 5 beats.  There were bradycardia arrhythmias with intermittent third-degree heart block.  There were runs of SVT.  There was probable idioventricular rhythm.  He had syncope in May 2023 and was found to have an EF of 35%.  He had no obstructive CAD on cath and had improvement in his EF to 40 - 45% with OMT.    He did wear a LifeVest for awhile but on follow up with Dr. Ladona Ridgel it was decided that he did not need a device.  He had had no further syncope.   At the last visit when I saw him he was coughing so bad I sent him directly to pulmonary visit.  I think this is multifactorial.  He is actually moving a little bit better air today and he is coughing less that he was.  However, he still complains of this as a predominant problem.  He has had no further syncope.  He is not having any new chest discomfort, neck or arm discomfort.  He is not having any new palpitations, PND  or orthopnea.   Past Medical History:  Diagnosis Date   Acute metabolic encephalopathy 03/08/2022   Acute respiratory failure with hypoxia (HCC) 03/20/2022   Acute respiratory failure with hypoxia and hypercapnia (H[facility]) 03/06/2022   CHF (congestive heart failure) (H[facility])    Dyslipidemia    Hypertension     Past Surgical History:  Procedure Laterality Date   LEG SURGERY     Right-sided as result of trauma   RIGHT/LEFT HEART CATH AND CORONARY ANGIOGRAPHY N/A 03/09/2022   Procedure: RIGHT/LEFT HEART CATH AND CORONARY ANGIOGRAPHY;  Surgeon: Runell Gess, MD;  Location: [facility];  Service: Cardiovascular;  Laterality: N/A;   Umbilicus surgery     As a child     Current Outpatient Medications  Medication Sig Dispense Refill   albuterol (VENTOLIN HFA) 108 (90 Base) MCG/ACT inhaler Inhale 2 puffs into the lungs every 4 (four) hours as needed for wheezing or shortness of breath. 1 each 11   aspirin EC 81 MG tablet Take 1 tablet (81 mg total) by mouth daily. Swallow whole. 90 tablet 3   atorvastatin (LIPITOR) 80 MG tablet Take 1 tablet (80 mg total) by mouth every evening. (Patient taking differently: Take 80 mg by mouth daily.) 90 tablet 3   bisoprolol (ZEBETA) 5 MG tablet Take  1 tablet (5 mg total) by mouth daily. 30 tablet 3   Budeson-Glycopyrrol-Formoterol (BREZTRI AEROSPHERE) 160-9-4.8 MCG/ACT AERO Inhale 2 puffs into the lungs as needed. 10.6 g 11   budesonide-formoterol (SYMBICORT) 160-4.5 MCG/ACT inhaler Inhale 1 puff into the lungs 2 (two) times daily.     dapagliflozin propanediol (FARXIGA) 10 MG TABS tablet Take 1 tablet (10 mg total) by mouth daily. 30 tablet 5   furosemide (LASIX) 40 MG tablet Take 1 tablet (40 mg total) by mouth 2 (two) times daily. 90 tablet 3   ipratropium-albuterol (DUONEB) 0.5-2.5 (3) MG/3ML SOLN Inhale 3 mLs into the lungs in the morning, at noon, in the evening, and at bedtime. 360 mL 11   isosorbide-hydrALAZINE (BIDIL) 20-37.5 MG tablet Take 1  tablet by mouth 3 (three) times daily. 90 tablet 3   loratadine (ALLERGY RELIEF) 10 MG tablet Take 10 mg by mouth at bedtime.     metFORMIN (GLUCOPHAGE) 500 MG tablet Take 1 tablet (500 mg total) by mouth 2 (two) times daily with a meal. 60 tablet 0   montelukast (SINGULAIR) 10 MG tablet Take 10 mg by mouth at bedtime.     nitroGLYCERIN (NITROSTAT) 0.4 MG SL tablet Place 1 tablet (0.4 mg total) under the tongue every 5 (five) minutes as needed for chest pain. 25 tablet 12   potassium chloride SA (KLOR-CON M) 20 MEQ tablet Take 1 tablet (20 mEq total) by mouth daily. 180 tablet 3   sacubitril-valsartan (ENTRESTO) 97-103 MG Take 1 tablet by mouth 2 (two) times daily. 180 tablet 3   spironolactone (ALDACTONE) 25 MG tablet Take 0.5 tablets (12.5 mg total) by mouth daily. 15 tablet 3   No current facility-administered medications for this visit.    Allergies:   Patient has no known allergies.    ROS:  Please see the history of present illness.   Otherwise, review of systems are positive for none.   All other systems are reviewed and negative.    PHYSICAL EXAM: VS:  BP 108/64   Pulse 85   Ht 5\' 5"  (1.651 m)   Wt 152 lb 12.8 oz (69.3 kg)   SpO2 90%   BMI 25.43 kg/m  , BMI Body mass index is 25.43 kg/m. GENERAL:  Well appearing NECK:  No jugular venous distention, waveform within normal limits, carotid upstroke brisk and symmetric, no bruits, no thyromegaly LUNGS: Decreased breath sounds bilaterally with expiratory wheezes CHEST:  Unremarkable HEART:  PMI not displaced or sustained,S1 and S2 within normal limits, no S3, no S4, no clicks, no rubs, no murmurs ABD:  Flat, positive bowel sounds normal in frequency in pitch, no bruits, no rebound, no guarding, no midline pulsatile mass, no hepatomegaly, no splenomegaly EXT:  2 plus pulses throughout, no edema, no cyanosis no clubbing   EKG:  EKG not ordered today.   Recent Labs: 05/07/2022: Magnesium 2.2 05/19/2022: ALT 32; Hemoglobin 16.5;  Platelets 174 06/18/2022: B Natriuretic Peptide 189.5 06/26/2022: BUN 13; Creatinine, Ser 1.35; Potassium 3.9; Sodium 137    Lipid Panel No results found for: "CHOL", "TRIG", "HDL", "CHOLHDL", "VLDL", "LDLCALC", "LDLDIRECT"    Wt Readings from Last 3 Encounters:  08/10/22 152 lb 12.8 oz (69.3 kg)  07/29/22 150 lb 12.8 oz (68.4 kg)  07/22/22 153 lb 3.2 oz (69.5 kg)      Other studies Reviewed: Additional studies/ records that were reviewed today include: Pulmonary records Review of the above records demonstrates:  Please see elsewhere in the note.     ASSESSMENT  AND PLAN:  HFrEF/NICM    He is on optimal medical therapy.  No change in therapy.  He seems to be euvolemic.  Syncope: He has had no further syncopal episodes.  Sleep apnea: Unfortunately he cannot have a sleep study because of continued cough.  He would not get a reasonable result.  BRADYCARDIA.  He has had no symptomatic bradycardia arrhythmias.  No change in therapy.  CAD : He has had no obstructive coronary disease.  No change in therapy  HTN: This is being managed in the context of treating his cardiomyopathy.  Cough: This is probably multifactorial.  I do not think it is primarily a cardiac etiology.  We discussed treatment of possible sinus drainage with Flonase and nasal spray saline  Tobacco use / Suspected COPD:   He is still smoking 5 cigarettes a day or so and I encouraged him to quit completely.   Current medicines are reviewed at length with the patient today.  The patient does not have concerns regarding medicines.  The following changes have been made:  None  Labs/ tests ordered today include: None  No orders of the defined types were placed in this encounter.    Disposition:   FU with 6 months.     Signed, Minus Breeding, MD  08/10/2022 1:18 PM    [facility]

## 2022-08-10 ENCOUNTER — Ambulatory Visit: Payer: Medicare Other | Attending: Cardiology | Admitting: Cardiology

## 2022-08-10 ENCOUNTER — Encounter: Payer: Self-pay | Admitting: Cardiology

## 2022-08-10 VITALS — BP 108/64 | HR 85 | Ht 65.0 in | Wt 152.8 lb

## 2022-08-10 DIAGNOSIS — I5020 Unspecified systolic (congestive) heart failure: Secondary | ICD-10-CM

## 2022-08-10 DIAGNOSIS — I2510 Atherosclerotic heart disease of native coronary artery without angina pectoris: Secondary | ICD-10-CM | POA: Diagnosis not present

## 2022-08-10 DIAGNOSIS — G4733 Obstructive sleep apnea (adult) (pediatric): Secondary | ICD-10-CM

## 2022-08-10 DIAGNOSIS — Z720 Tobacco use: Secondary | ICD-10-CM

## 2022-08-10 DIAGNOSIS — R55 Syncope and collapse: Secondary | ICD-10-CM

## 2022-08-10 DIAGNOSIS — E118 Type 2 diabetes mellitus with unspecified complications: Secondary | ICD-10-CM

## 2022-08-10 NOTE — Patient Instructions (Signed)
Medication Instructions:  May use Saline nasal spray May use Flonase Nasal spray one spray in each nostril twice a day Continue all other medications *If you need a refill on your cardiac medications before your next appointment, please call your pharmacy*   Lab Work: None ordered   Testing/Procedures: None ordered   Follow-Up: At [facility], you and your health needs are our priority.  As part of our continuing mission to provide you with exceptional heart care, we have created designated Provider Care Teams.  These Care Teams include your primary Cardiologist (physician) and Advanced Practice Providers (APPs -  Physician Assistants and Nurse Practitioners) who all work together to provide you with the care you need, when you need it.  We recommend signing up for the patient portal called "MyChart".  Sign up information is provided on this After Visit Summary.  MyChart is used to connect with patients for Virtual Visits (Telemedicine).  Patients are able to view lab/test results, encounter notes, upcoming appointments, etc.  Non-urgent messages can be sent to your provider as well.   To learn more about what you can do with MyChart, go to [email or web address].    Your next appointment:  6 months   Call in Jan to schedule appointment     The format for your next appointment: Office   Provider:  Dr.Hochrein's PA   Important Information About Sugar

## 2022-08-12 ENCOUNTER — Other Ambulatory Visit (HOSPITAL_COMMUNITY): Payer: Self-pay | Admitting: Emergency Medicine

## 2022-08-12 NOTE — Progress Notes (Signed)
Paramedicine Encounter    Patient ID: Marcus Tran, male    DOB: 08-May-1951, 71 y.o.   MRN: [id number]   BP 104/71 (BP Location: Right Arm, Patient Position: Sitting, Cuff Size: Normal)   Pulse 77   Wt 149 lb 12.8 oz (67.9 kg)   BMI 24.93 kg/m  Weight yesterday-not taken Last visit weight-150lb  ATF Mr. Lapid A&O x 4, skin W&D w/ good color.  Pt says he feels well today and was sitting outside watching his grandson when I arrived.  Pt denies chest pain or SOB.  Lung sounds with wheezing and ronchi throughout which as been baseline for him since I've been seeing him as a patient.  He does home breathing treatments daily but hasn't had one since this morning.  He says he has a productive cough with greenish sputum.  He is afebrile.   Pt did took all his medications as dispenced in his pill box.  Med box reconciled outside at my vehicle due to pt's home having bedbugs.  Med list was missing Metformin 500mg . 1 tablet BID.  Needs to be added back to his med list.  Home visit complete.    Renee Ramus, [place] 08/12/2022   Patient Care Team: Cipriano Mile, NP as PCP - Cheral Bay, MD as PCP - Cardiology (Cardiology)  Patient Active Problem List   Diagnosis Date Noted   Obstructive sleep apnea (adult) (pediatric) 08/08/2022   Acute respiratory failure with hypoxia ([employer]) 05/04/2022   Third degree heart block ([place]) 04/15/2022   Snoring 04/15/2022   Nonischemic cardiomyopathy ([place]) 04/15/2022   HFrEF (heart failure with reduced ejection fraction) ([place]) 04/15/2022   COPD with acute exacerbation ([place]) 03/22/2022   Acute on chronic combined systolic and diastolic CHF (congestive heart failure) ([place])    Tobacco abuse 03/08/2022   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   HTN (hypertension) 03/08/2022   Stage 3a chronic kidney disease (CKD) ([place]) 03/08/2022   Coronary artery disease 03/08/2022    Current Outpatient Medications:    albuterol (VENTOLIN  HFA) 108 (90 Base) MCG/ACT inhaler, Inhale 2 puffs into the lungs every 4 (four) hours as needed for wheezing or shortness of breath., Disp: 1 each, Rfl: 11   atorvastatin (LIPITOR) 80 MG tablet, Take 1 tablet (80 mg total) by mouth every evening. (Patient taking differently: Take 80 mg by mouth daily.), Disp: 90 tablet, Rfl: 3   bisoprolol (ZEBETA) 5 MG tablet, Take 1 tablet (5 mg total) by mouth daily., Disp: 30 tablet, Rfl: 3   Budeson-Glycopyrrol-Formoterol (BREZTRI AEROSPHERE) 160-9-4.8 MCG/ACT AERO, Inhale 2 puffs into the lungs as needed., Disp: 10.6 g, Rfl: 11   budesonide-formoterol (SYMBICORT) 160-4.5 MCG/ACT inhaler, Inhale 1 puff into the lungs 2 (two) times daily., Disp: , Rfl:    dapagliflozin propanediol (FARXIGA) 10 MG TABS tablet, Take 1 tablet (10 mg total) by mouth daily., Disp: 30 tablet, Rfl: 5   furosemide (LASIX) 40 MG tablet, Take 1 tablet (40 mg total) by mouth 2 (two) times daily., Disp: 90 tablet, Rfl: 3   ipratropium-albuterol (DUONEB) 0.5-2.5 (3) MG/3ML SOLN, Inhale 3 mLs into the lungs in the morning, at noon, in the evening, and at bedtime., Disp: 360 mL, Rfl: 11   isosorbide-hydrALAZINE (BIDIL) 20-37.5 MG tablet, Take 1 tablet by mouth 3 (three) times daily., Disp: 90 tablet, Rfl: 3   loratadine (ALLERGY RELIEF) 10 MG tablet, Take 10 mg by mouth at bedtime., Disp: , Rfl:    montelukast (SINGULAIR) 10 MG tablet, Take  10 mg by mouth at bedtime., Disp: , Rfl:    sacubitril-valsartan (ENTRESTO) 97-103 MG, Take 1 tablet by mouth 2 (two) times daily., Disp: 180 tablet, Rfl: 3   spironolactone (ALDACTONE) 25 MG tablet, Take 0.5 tablets (12.5 mg total) by mouth daily., Disp: 15 tablet, Rfl: 3   aspirin EC 81 MG tablet, Take 1 tablet (81 mg total) by mouth daily. Swallow whole. (Patient not taking: Reported on 08/12/2022), Disp: 90 tablet, Rfl: 3   metFORMIN (GLUCOPHAGE) 500 MG tablet, Take 1 tablet (500 mg total) by mouth 2 (two) times daily with a meal., Disp: 60 tablet, Rfl:  0   nitroGLYCERIN (NITROSTAT) 0.4 MG SL tablet, Place 1 tablet (0.4 mg total) under the tongue every 5 (five) minutes as needed for chest pain. (Patient not taking: Reported on 08/12/2022), Disp: 25 tablet, Rfl: 12   potassium chloride SA (KLOR-CON M) 20 MEQ tablet, Take 1 tablet (20 mEq total) by mouth daily., Disp: 180 tablet, Rfl: 3 No Known Allergies    Social History   Socioeconomic History   Marital status: Single    Spouse name: Not on file   Number of children: 1   Years of education: Not on file   Highest education level: 10th grade  Occupational History   Occupation: Disability  Tobacco Use   Smoking status: Former    Packs/day: 0.50    Years: 50.00    Total pack years: 25.00    Types: Cigarettes    Quit date: 03/19/2022    Years since quitting: 0.4   Smokeless tobacco: Never   Tobacco comments:    Smoking cessation  Vaping Use   Vaping Use: Never used  Substance and Sexual Activity   Alcohol use: Not on file    Comment: socially   Drug use: Yes    Types: Marijuana    Comment: past   Sexual activity: Not on file  Other Topics Concern   Not on file  Social History Narrative   He lives with his daughter apparently and 5 grandchildren.  He reports that he smokes probably less than a pack of cigarettes a day.  He occasionally drinks a beer or liquor but not daily.   Social Determinants of Health   Financial Resource Strain: Low Risk  (03/11/2022)   Overall Financial Resource Strain (CARDIA)    Difficulty of Paying Living Expenses: Not very hard  Food Insecurity: No Food Insecurity (05/06/2022)   Hunger Vital Sign    Worried About Running Out of Food in the Last Year: Never true    Ran Out of Food in the Last Year: Never true  Transportation Needs: No Transportation Needs (05/06/2022)   PRAPARE - Hydrologist (Medical): No    Lack of Transportation (Non-Medical): No  Physical Activity: Not on file  Stress: Not on file  Social  Connections: Not on file  Intimate Partner Violence: Not on file    Physical Exam      Future Appointments  Date Time Provider [place]  08/13/2022 10:30 AM [facility] NURSE [facility] None  12/31/2022 11:30 AM Evans Lance, MD [facility] [place], [place] [facility] Paramedic  08/12/22

## 2022-08-13 ENCOUNTER — Emergency Department (HOSPITAL_COMMUNITY): Payer: Medicare Other

## 2022-08-13 ENCOUNTER — Other Ambulatory Visit: Payer: Self-pay

## 2022-08-13 ENCOUNTER — Ambulatory Visit (HOSPITAL_COMMUNITY)
Admission: RE | Admit: 2022-08-13 | Discharge: 2022-08-13 | Disposition: A | Discharge status: Home / Self Care | Payer: Medicare Other | Source: Ambulatory Visit | Attending: Cardiology | Admitting: Cardiology

## 2022-08-13 ENCOUNTER — Encounter (HOSPITAL_COMMUNITY): Payer: Self-pay | Admitting: Emergency Medicine

## 2022-08-13 ENCOUNTER — Inpatient Hospital Stay (HOSPITAL_COMMUNITY)
Admission: EM | Admit: 2022-08-13 | Discharge: 2022-08-17 | DRG: 190 | Disposition: A | Discharge status: Home Health | Payer: Medicare Other | Attending: Internal Medicine | Admitting: Internal Medicine

## 2022-08-13 VITALS — BP 90/50 | HR 94 | Wt 150.0 lb

## 2022-08-13 DIAGNOSIS — I4510 Unspecified right bundle-branch block: Secondary | ICD-10-CM | POA: Insufficient documentation

## 2022-08-13 DIAGNOSIS — I2510 Atherosclerotic heart disease of native coronary artery without angina pectoris: Secondary | ICD-10-CM | POA: Diagnosis present

## 2022-08-13 DIAGNOSIS — I130 Hypertensive heart and chronic kidney disease with heart failure and stage 1 through stage 4 chronic kidney disease, or unspecified chronic kidney disease: Secondary | ICD-10-CM | POA: Diagnosis present

## 2022-08-13 DIAGNOSIS — I5023 Acute on chronic systolic (congestive) heart failure: Secondary | ICD-10-CM | POA: Diagnosis present

## 2022-08-13 DIAGNOSIS — I10 Essential (primary) hypertension: Secondary | ICD-10-CM | POA: Diagnosis present

## 2022-08-13 DIAGNOSIS — F17210 Nicotine dependence, cigarettes, uncomplicated: Secondary | ICD-10-CM | POA: Diagnosis present

## 2022-08-13 DIAGNOSIS — R0602 Shortness of breath: Secondary | ICD-10-CM | POA: Diagnosis not present

## 2022-08-13 DIAGNOSIS — N1831 Chronic kidney disease, stage 3a: Secondary | ICD-10-CM | POA: Diagnosis present

## 2022-08-13 DIAGNOSIS — Z7984 Long term (current) use of oral hypoglycemic drugs: Secondary | ICD-10-CM

## 2022-08-13 DIAGNOSIS — J441 Chronic obstructive pulmonary disease with (acute) exacerbation: Principal | ICD-10-CM | POA: Diagnosis present

## 2022-08-13 DIAGNOSIS — I5020 Unspecified systolic (congestive) heart failure: Secondary | ICD-10-CM | POA: Insufficient documentation

## 2022-08-13 DIAGNOSIS — Z720 Tobacco use: Secondary | ICD-10-CM | POA: Diagnosis present

## 2022-08-13 DIAGNOSIS — Z8249 Family history of ischemic heart disease and other diseases of the circulatory system: Secondary | ICD-10-CM

## 2022-08-13 DIAGNOSIS — Z7951 Long term (current) use of inhaled steroids: Secondary | ICD-10-CM

## 2022-08-13 DIAGNOSIS — F17200 Nicotine dependence, unspecified, uncomplicated: Secondary | ICD-10-CM | POA: Diagnosis present

## 2022-08-13 DIAGNOSIS — J9601 Acute respiratory failure with hypoxia: Secondary | ICD-10-CM | POA: Diagnosis present

## 2022-08-13 DIAGNOSIS — I5022 Chronic systolic (congestive) heart failure: Secondary | ICD-10-CM | POA: Diagnosis present

## 2022-08-13 DIAGNOSIS — Z79899 Other long term (current) drug therapy: Secondary | ICD-10-CM

## 2022-08-13 DIAGNOSIS — Z1152 Encounter for screening for COVID-19: Secondary | ICD-10-CM

## 2022-08-13 DIAGNOSIS — Z7982 Long term (current) use of aspirin: Secondary | ICD-10-CM

## 2022-08-13 DIAGNOSIS — N179 Acute kidney failure, unspecified: Secondary | ICD-10-CM | POA: Diagnosis present

## 2022-08-13 DIAGNOSIS — E1122 Type 2 diabetes mellitus with diabetic chronic kidney disease: Secondary | ICD-10-CM | POA: Diagnosis present

## 2022-08-13 DIAGNOSIS — E119 Type 2 diabetes mellitus without complications: Secondary | ICD-10-CM

## 2022-08-13 DIAGNOSIS — E785 Hyperlipidemia, unspecified: Secondary | ICD-10-CM | POA: Diagnosis present

## 2022-08-13 LAB — COMPREHENSIVE METABOLIC PANEL
ALT: 26 U/L (ref 0–44)
AST: 37 U/L (ref 15–41)
Albumin: 3.9 g/dL (ref 3.5–5.0)
Alkaline Phosphatase: 55 U/L (ref 38–126)
Anion gap: 13 (ref 5–15)
BUN: 21 mg/dL (ref 8–23)
CO2: 26 mmol/L (ref 22–32)
Calcium: 8.8 mg/dL — ABNORMAL LOW (ref 8.9–10.3)
Chloride: 98 mmol/L (ref 98–111)
Creatinine, Ser: 1.92 mg/dL — ABNORMAL HIGH (ref 0.61–1.24)
GFR, Estimated: 37 mL/min — ABNORMAL LOW (ref >60)
Glucose, Bld: 123 mg/dL — ABNORMAL HIGH (ref 70–99)
Potassium: 4 mmol/L (ref 3.5–5.1)
Sodium: 137 mmol/L (ref 135–145)
Total Bilirubin: 0.9 mg/dL (ref 0.3–1.2)
Total Protein: 7.3 g/dL (ref 6.5–8.1)

## 2022-08-13 LAB — CBC
HCT: 50.9 % (ref 39.0–52.0)
Hemoglobin: 16.6 g/dL (ref 13.0–17.0)
MCH: 30.1 pg (ref 26.0–34.0)
MCHC: 32.6 g/dL (ref 30.0–36.0)
MCV: 92.4 fL (ref 80.0–100.0)
Platelets: 176 10*3/uL (ref 150–400)
RBC: 5.51 MIL/uL (ref 4.22–5.81)
RDW: 14.4 % (ref 11.5–15.5)
WBC: 5.1 10*3/uL (ref 4.0–10.5)
nRBC: 0 % (ref 0.0–0.2)

## 2022-08-13 LAB — BRAIN NATRIURETIC PEPTIDE: B Natriuretic Peptide: 148.6 pg/mL — ABNORMAL HIGH (ref 0.0–100.0)

## 2022-08-13 LAB — RESP PANEL BY RT-PCR (FLU A&B, COVID) ARPGX2
Influenza A by PCR: NEGATIVE
Influenza B by PCR: NEGATIVE
SARS Coronavirus 2 by RT PCR: NEGATIVE

## 2022-08-13 LAB — TROPONIN I (HIGH SENSITIVITY)
Troponin I (High Sensitivity): 26 ng/L — ABNORMAL HIGH (ref <18)
Troponin I (High Sensitivity): 29 ng/L — ABNORMAL HIGH (ref <18)

## 2022-08-13 MED ORDER — ATORVASTATIN CALCIUM 80 MG PO TABS
80.0000 mg | ORAL_TABLET | Freq: Every evening | ORAL | Status: DC
  Administered 2022-08-14 – 2022-08-16 (×3): 80 mg via ORAL
  Filled 2022-08-13 (×3): qty 1

## 2022-08-13 MED ORDER — ALBUTEROL SULFATE HFA 108 (90 BASE) MCG/ACT IN AERS
4.0000 | INHALATION_SPRAY | Freq: Once | RESPIRATORY_TRACT | Status: AC
  Administered 2022-08-13: 4 via RESPIRATORY_TRACT
  Filled 2022-08-13: qty 6.7

## 2022-08-13 MED ORDER — BISOPROLOL FUMARATE 5 MG PO TABS
5.0000 mg | ORAL_TABLET | Freq: Every day | ORAL | Status: DC
  Administered 2022-08-14 – 2022-08-17 (×4): 5 mg via ORAL
  Filled 2022-08-13 (×4): qty 1

## 2022-08-13 MED ORDER — ALBUTEROL SULFATE (2.5 MG/3ML) 0.083% IN NEBU
2.5000 mg | INHALATION_SOLUTION | Freq: Once | RESPIRATORY_TRACT | Status: AC
  Administered 2022-08-13: 2.5 mg via RESPIRATORY_TRACT
  Filled 2022-08-13: qty 3

## 2022-08-13 MED ORDER — MONTELUKAST SODIUM 10 MG PO TABS
10.0000 mg | ORAL_TABLET | Freq: Every day | ORAL | Status: DC
  Administered 2022-08-13 – 2022-08-16 (×4): 10 mg via ORAL
  Filled 2022-08-13 (×4): qty 1

## 2022-08-13 MED ORDER — ENOXAPARIN SODIUM 40 MG/0.4ML IJ SOSY
40.0000 mg | PREFILLED_SYRINGE | INTRAMUSCULAR | Status: DC
  Administered 2022-08-14 – 2022-08-17 (×4): 40 mg via SUBCUTANEOUS
  Filled 2022-08-13 (×4): qty 0.4

## 2022-08-13 MED ORDER — FUROSEMIDE 20 MG PO TABS: 40.0000 mg | ORAL_TABLET | Freq: Two times a day (BID) | ORAL | Status: DC

## 2022-08-13 MED ORDER — ISOSORB DINITRATE-HYDRALAZINE 20-37.5 MG PO TABS
1.0000 | ORAL_TABLET | Freq: Three times a day (TID) | ORAL | Status: DC
  Administered 2022-08-14 – 2022-08-17 (×10): 1 via ORAL
  Filled 2022-08-13 (×14): qty 1

## 2022-08-13 MED ORDER — BUDESON-GLYCOPYRROL-FORMOTEROL 160-9-4.8 MCG/ACT IN AERO: 2.0000 | INHALATION_SPRAY | Freq: Two times a day (BID) | RESPIRATORY_TRACT | Status: DC

## 2022-08-13 MED ORDER — NICOTINE 7 MG/24HR TD PT24
7.0000 mg | MEDICATED_PATCH | Freq: Every day | TRANSDERMAL | Status: DC
  Filled 2022-08-13 (×4): qty 1

## 2022-08-13 MED ORDER — IPRATROPIUM-ALBUTEROL 0.5-2.5 (3) MG/3ML IN SOLN
3.0000 mL | Freq: Once | RESPIRATORY_TRACT | Status: AC
  Administered 2022-08-13: 3 mL via RESPIRATORY_TRACT
  Filled 2022-08-13: qty 3

## 2022-08-13 MED ORDER — ALBUTEROL SULFATE HFA 108 (90 BASE) MCG/ACT IN AERS: 2.0000 | INHALATION_SPRAY | RESPIRATORY_TRACT | Status: DC | PRN

## 2022-08-13 MED ORDER — ASPIRIN 81 MG PO TBEC
81.0000 mg | DELAYED_RELEASE_TABLET | Freq: Every day | ORAL | Status: DC
  Administered 2022-08-14 – 2022-08-17 (×4): 81 mg via ORAL
  Filled 2022-08-13 (×4): qty 1

## 2022-08-13 MED ORDER — MAGNESIUM SULFATE 2 GM/50ML IV SOLN
2.0000 g | Freq: Once | INTRAVENOUS | Status: AC
  Administered 2022-08-13: 2 g via INTRAVENOUS
  Filled 2022-08-13: qty 50

## 2022-08-13 MED ORDER — SACUBITRIL-VALSARTAN 97-103 MG PO TABS
1.0000 | ORAL_TABLET | Freq: Two times a day (BID) | ORAL | Status: DC
  Administered 2022-08-14: 1 via ORAL
  Filled 2022-08-13 (×2): qty 1

## 2022-08-13 MED ORDER — LORATADINE 10 MG PO TABS
10.0000 mg | ORAL_TABLET | Freq: Every morning | ORAL | Status: DC
  Administered 2022-08-14 – 2022-08-17 (×4): 10 mg via ORAL
  Filled 2022-08-13 (×4): qty 1

## 2022-08-13 MED ORDER — DAPAGLIFLOZIN PROPANEDIOL 10 MG PO TABS
10.0000 mg | ORAL_TABLET | Freq: Every day | ORAL | Status: DC
  Administered 2022-08-14 – 2022-08-17 (×4): 10 mg via ORAL
  Filled 2022-08-13 (×4): qty 1

## 2022-08-13 MED ORDER — POTASSIUM CHLORIDE CRYS ER 20 MEQ PO TBCR
20.0000 meq | EXTENDED_RELEASE_TABLET | Freq: Every day | ORAL | Status: DC
  Administered 2022-08-14 – 2022-08-17 (×4): 20 meq via ORAL
  Filled 2022-08-13 (×4): qty 1

## 2022-08-13 MED ORDER — PREDNISONE 20 MG PO TABS: 40.0000 mg | ORAL_TABLET | Freq: Every day | ORAL | Status: DC

## 2022-08-13 MED ORDER — ALBUTEROL SULFATE (2.5 MG/3ML) 0.083% IN NEBU
2.5000 mg | INHALATION_SOLUTION | RESPIRATORY_TRACT | Status: DC | PRN
  Administered 2022-08-13: 2.5 mg via RESPIRATORY_TRACT
  Filled 2022-08-13 (×2): qty 3

## 2022-08-13 MED ORDER — IPRATROPIUM-ALBUTEROL 0.5-2.5 (3) MG/3ML IN SOLN
3.0000 mL | Freq: Four times a day (QID) | RESPIRATORY_TRACT | Status: DC
  Administered 2022-08-14 – 2022-08-15 (×6): 3 mL via RESPIRATORY_TRACT
  Filled 2022-08-13 (×7): qty 3

## 2022-08-13 MED ORDER — SODIUM CHLORIDE 0.9 % IV SOLN
1.0000 g | Freq: Every day | INTRAVENOUS | Status: DC
  Administered 2022-08-13 – 2022-08-16 (×4): 1 g via INTRAVENOUS
  Filled 2022-08-13 (×4): qty 10

## 2022-08-13 MED ORDER — SPIRONOLACTONE 12.5 MG HALF TABLET
12.5000 mg | ORAL_TABLET | Freq: Every day | ORAL | Status: DC
  Administered 2022-08-14 – 2022-08-17 (×4): 12.5 mg via ORAL
  Filled 2022-08-13 (×4): qty 1

## 2022-08-13 MED ORDER — PREDNISONE 20 MG PO TABS
40.0000 mg | ORAL_TABLET | Freq: Once | ORAL | Status: AC
  Administered 2022-08-13: 40 mg via ORAL
  Filled 2022-08-13: qty 2

## 2022-08-13 MED ORDER — ACETAMINOPHEN 325 MG PO TABS
650.0000 mg | ORAL_TABLET | Freq: Four times a day (QID) | ORAL | Status: DC | PRN
  Administered 2022-08-13: 650 mg via ORAL
  Filled 2022-08-13: qty 2

## 2022-08-13 NOTE — ED Notes (Signed)
Room air trial conducted, pt consistently satting 87% on room air sitting, 82-84% during short ambulation trial. Pt does not appear to be in distress at this time. States he feels better and still endorses want to be discharged today. MD/PA notified.

## 2022-08-13 NOTE — Assessment & Plan Note (Addendum)
Creat today of 1.92 maybe slightly increased from baseline, possibly due to meds used to treat CHF. 1. Monitor closely while in hospital 2. But wont treat as AKI / change CHF meds for the moment. 3. Hold metformin

## 2022-08-13 NOTE — Assessment & Plan Note (Signed)
Continue Farxiga Hold Metformin CBG checks AC/HS

## 2022-08-13 NOTE — Assessment & Plan Note (Signed)
Pt with chronic HFrEF.  Not clear that acute CHF exacerbation is playing much of a role today in respiratory issues. 1) orthopnea not any worse than baseline per patient 2) no peripheral edema 3) weight today is unchanged from discharge weight following diuresis during CHF admission.  Therefore: 1. Cont home entresto, zebeta, bidil 2. Cont home diuretics 3. Watch for development of volume overload with steroids being used to treat COPD exacerbation 4. Tele monitor

## 2022-08-13 NOTE — Assessment & Plan Note (Signed)
Patient has acute respiratory failure with hypoxia due to having a new oxygen requirement.  That is the patient has a PaO2 < 60 (pulse Ox < 90%) on room air. 1. New 2L O2 requirement 2. Satting in upper 80s with ambulation on RA 3. Not normally on O2 at baseline 4. Cont pulse ox

## 2022-08-13 NOTE — Assessment & Plan Note (Signed)
Cont entresto, bidil, bisoprolol

## 2022-08-13 NOTE — H&P (Addendum)
History and Physical    Patient: Marcus Tran QBH:[id number] DOB: [birth year] DOA: 08/13/2022 DOS: the patient was seen and examined on 08/13/2022 PCP: Hillery Aldo, NP  Patient coming from: Home  Chief Complaint:  Chief Complaint  Patient presents with   Shortness of Breath   HPI: Marcus Tran is a 71 y.o. male with medical history significant of HFrEF, COPD still smoking, not on O2 at baseline.  EF 35%.  RVSP 54.  LHC = non occlusive dz.  Admitted in May for COPD with acute hypoxic resp failure.  CHF exacerbation as well, IV diuresis and discharge wt 163 lbs.  Readmitted 7/17 with CHF exacerbation.  Diuresed down to 151 lbs at discharge.  Most recent LHC = EF 40-45%.  Seen by cards 2 days ago.  Cards noted euvolemic at that time but noted cough and suspicious of pulmonary etiology.  Pt in to ED today with wheezing, SOB, cough.  Orthopnea not any worse than baseline.  Wt today of 68 kg is same as discharge earlier this year after CHF admit.  Pt given neb treatments, prednisone.  Wheezing improved though pt still with coarse breath sounds and desats with ambulation.   Review of Systems: As mentioned in the history of present illness. All other systems reviewed and are negative. Past Medical History:  Diagnosis Date   Acute metabolic encephalopathy 03/08/2022   Acute respiratory failure with hypoxia (HCC) 03/20/2022   Acute respiratory failure with hypoxia and hypercapnia (HCC) 03/06/2022   CHF (congestive heart failure) (HCC)    Dyslipidemia    Hypertension    Past Surgical History:  Procedure Laterality Date   LEG SURGERY     Right-sided as result of trauma   RIGHT/LEFT HEART CATH AND CORONARY ANGIOGRAPHY N/A 03/09/2022   Procedure: RIGHT/LEFT HEART CATH AND CORONARY ANGIOGRAPHY;  Surgeon: Runell Gess, MD;  [facility];  Service: Cardiovascular;  Laterality: N/A;   Umbilicus surgery     As a child   Social History:  reports that he quit smoking  about 4 months ago. His smoking use included cigarettes. He has a 25.00 pack-year smoking history. He has never used smokeless tobacco. He reports current drug use. Drug: Marijuana. No history on file for alcohol use.  No Known Allergies  Family History  Problem Relation Age of Onset   Hypertension Mother     Prior to Admission medications   Medication Sig Start Date End Date Taking? Authorizing Provider  albuterol (VENTOLIN HFA) 108 (90 Base) MCG/ACT inhaler Inhale 2 puffs into the lungs every 4 (four) hours as needed for wheezing or shortness of breath. 04/06/22  Yes Hunsucker, Lesia Sago, MD  aspirin EC 81 MG tablet Take 1 tablet (81 mg total) by mouth daily. Swallow whole. 03/24/22  Yes Alver Sorrow, NP  atorvastatin (LIPITOR) 80 MG tablet Take 1 tablet (80 mg total) by mouth every evening. 03/24/22  Yes Alver Sorrow, NP  bisoprolol (ZEBETA) 5 MG tablet Take 1 tablet (5 mg total) by mouth daily. 06/18/22  Yes Simmons, Brittainy M, PA-C  Budeson-Glycopyrrol-Formoterol (BREZTRI AEROSPHERE) 160-9-4.8 MCG/ACT AERO Inhale 2 puffs into the lungs as needed. 05/21/22  Yes Hunsucker, Lesia Sago, MD  dapagliflozin propanediol (FARXIGA) 10 MG TABS tablet Take 1 tablet (10 mg total) by mouth daily. 03/24/22  Yes Alver Sorrow, NP  furosemide (LASIX) 40 MG tablet Take 1 tablet (40 mg total) by mouth 2 (two) times daily. 07/09/22  Yes Allayne Butcher, PA-C  isosorbide-hydrALAZINE (BIDIL) 20-37.5 MG tablet Take 1 tablet by mouth 3 (three) times daily. 03/24/22  Yes Loel Dubonnet, NP  loratadine (ALLERGY RELIEF) 10 MG tablet Take 10 mg by mouth in the morning.   Yes [provider]  montelukast (SINGULAIR) 10 MG tablet Take 10 mg by mouth at bedtime.   Yes [provider]  nitroGLYCERIN (NITROSTAT) 0.4 MG SL tablet Place 1 tablet (0.4 mg total) under the tongue every 5 (five) minutes as needed for chest pain. 03/11/22  Yes Shawna Clamp, MD  potassium chloride SA (KLOR-CON M) 20  MEQ tablet Take 1 tablet (20 mEq total) by mouth daily. 07/09/22  Yes Simmons, Brittainy M, PA-C  sacubitril-valsartan (ENTRESTO) 97-103 MG Take 1 tablet by mouth 2 (two) times daily. 07/09/22  Yes Lyda Jester M, PA-C  spironolactone (ALDACTONE) 25 MG tablet Take 0.5 tablets (12.5 mg total) by mouth daily. 06/18/22  Yes Simmons, Brittainy M, PA-C  SYMBICORT 160-4.5 MCG/ACT inhaler Inhale 2 puffs into the lungs 2 (two) times daily.   Yes [provider]  ipratropium-albuterol (DUONEB) 0.5-2.5 (3) MG/3ML SOLN Inhale 3 mLs into the lungs in the morning, at noon, in the evening, and at bedtime. 05/21/22   Hunsucker, Bonna Gains, MD  metFORMIN (GLUCOPHAGE) 500 MG tablet Take 1 tablet (500 mg total) by mouth 2 (two) times daily with a meal. 05/07/22   Thurnell Lose, MD    Physical Exam: Vitals:   08/13/22 2030 08/13/22 2045 08/13/22 2100 08/13/22 2115  BP: (!) 141/94 (!) 131/95 (!) 146/97 (!) 124/104  Pulse: 81 80 81 84  Resp: (!) 25 (!) 29 (!) 23 18  Temp:      TempSrc:      SpO2: 95% 95% 94% 95%   Constitutional: NAD, calm, comfortable Eyes: PERRL, lids and conjunctivae normal ENMT: Mucous membranes are moist. Posterior pharynx clear of any exudate or lesions.Normal dentition.  Neck: normal, supple, no masses, no thyromegaly Respiratory: Coarse BS bilaterally, no wheezing.  Normal respiratory effort. No accessory muscle use.  Cardiovascular: Regular rate and rhythm, no murmurs / rubs / gallops. No extremity edema. 2+ pedal pulses. No carotid bruits.  Abdomen: no tenderness, no masses palpated. No hepatosplenomegaly. Bowel sounds positive.  Musculoskeletal: no clubbing / cyanosis. No joint deformity upper and lower extremities. Good ROM, no contractures. Normal muscle tone. Skin: no rashes, lesions, ulcers. No induration Neurologic: CN 2-12 grossly intact. Sensation intact, DTR normal. Strength 5/5 in all 4.  Psychiatric: Normal judgment and insight. Alert and oriented x 3. Normal  mood.   Data Reviewed:        Latest Ref Rng & Units 08/13/2022   11:47 AM 06/26/2022    2:12 PM 06/18/2022    4:04 PM  CMP  Glucose 70 - 99 mg/dL 123  99  108   BUN 8 - 23 mg/dL 21  13  18    Creatinine 0.61 - 1.24 mg/dL 1.92  1.35  1.48   Sodium 135 - 145 mmol/L 137  137  136   Potassium 3.5 - 5.1 mmol/L 4.0  3.9  3.7   Chloride 98 - 111 mmol/L 98  106  99   CO2 22 - 32 mmol/L 26  25  30    Calcium 8.9 - 10.3 mg/dL 8.8  8.5  8.7   Total Protein 6.5 - 8.1 g/dL 7.3     Total Bilirubin 0.3 - 1.2 mg/dL 0.9     Alkaline Phos 38 - 126 U/L 55  AST 15 - 41 U/L 37     ALT 0 - 44 U/L 26      COVID and flu neg  CBC unremarkable  CXR: IMPRESSION: Stable top-normal heart size and fullness of bilateral hilar structures potentially related to pulmonary vascular congestion. Radiographic appearance is stable compared to imaging from March 20, 2022.  BNP 148.6  Trops 26 and 29  Assessment and Plan: * COPD exacerbation ([place]) Pt with COPD exacerbation, still smoking. Wheezing improved post prednisone and nebs but still has coarse breath sounds and O2 requirement. COPD pathway Tele monitor Cont breztri PRN albuterol Prednisone Rocephin  Acute respiratory failure with hypoxia (HCC) Patient has acute respiratory failure with hypoxia due to having a new oxygen requirement.  That is the patient has a PaO2 < 60 (pulse Ox < 90%) on room air. New 2L O2 requirement Satting in upper 80s with ambulation on RA Not normally on O2 at baseline Cont pulse ox  Tobacco abuse Still smoking. Discussed need to quit. Nicotine patch while in hospital.  Chronic HFrEF (heart failure with reduced ejection fraction) ([place]) Pt with chronic HFrEF.  Not clear that acute CHF exacerbation is playing much of a role today in respiratory issues. 1) orthopnea not any worse than baseline per patient 2) no peripheral edema 3) weight today is unchanged from discharge weight following diuresis during CHF  admission.  Therefore: Cont home entresto, zebeta, bidil Cont home diuretics Watch for development of volume overload with steroids being used to treat COPD exacerbation Tele monitor  DM2 (diabetes mellitus, type 2) ([place]) Continue Farxiga Hold Metformin CBG checks AC/HS  Stage 3a chronic kidney disease (CKD) (HCC) Creat today of 1.92 maybe slightly increased from baseline, possibly due to meds used to treat CHF. Monitor closely while in hospital But wont treat as AKI / change CHF meds for the moment. Hold metformin  HTN (hypertension) Cont entresto, bidil, bisoprolol      Advance Care Planning:   Code Status: Full Code  Consults: None  Family Communication: No family in room  Severity of Illness: The appropriate patient status for this patient is OBSERVATION. Observation status is judged to be reasonable and necessary in order to provide the required intensity of service to ensure the patient's safety. The patient's presenting symptoms, physical exam findings, and initial radiographic and laboratory data in the context of their medical condition is felt to place them at decreased risk for further clinical deterioration. Furthermore, it is anticipated that the patient will be medically stable for discharge from the hospital within 2 midnights of admission.   Author: Etta Quill., DO 08/13/2022 10:32 PM  For on call review [email or web address].

## 2022-08-13 NOTE — ED Provider Triage Note (Signed)
Emergency Medicine Provider Triage Evaluation Note  Marcus Tran , a 71 y.o. male  was evaluated in triage.  Pt complains of SOB.  Pt states he felt fine this morning but started coughing on the way to HF clinic and then started having more trouble breathing.   On 2L [place] O2 started by HF clinic.   No hx of COPD   Review of Systems  Positive: Cough, SOB Negative: Fever   Physical Exam  BP 106/74 (BP Location: Right Arm)   Pulse 88   Temp 98.2 F (36.8 C) (Oral)   Resp (!) 34   SpO2 95%  Gen:   Awake, no distress   Resp:  Normal effort MSK:   Moves extremities without difficulty  Other:  Wheezing expiratory. Speaking in full sentences. No crackles, no LEE.   Medical Decision Making  Medically screening exam initiated at 11:22 AM.  Appropriate orders placed.  Marcus Tran was informed that the remainder of the evaluation will be completed by another provider, this initial triage assessment does not replace that evaluation, and the importance of remaining in the ED until their evaluation is complete.  Last echo 8/31 w improvement of EF to 40-45%  Wheezing.   CXR, labs. Trop.    Marcus Tran, [place] 08/13/22 1125

## 2022-08-13 NOTE — Assessment & Plan Note (Signed)
Pt with COPD exacerbation, still smoking. Wheezing improved post prednisone and nebs but still has coarse breath sounds and O2 requirement. 1. COPD pathway 2. Tele monitor 3. Cont breztri 4. PRN albuterol 5. Prednisone 6. Rocephin

## 2022-08-13 NOTE — ED Provider Notes (Signed)
[facility] EMERGENCY DEPARTMENT Provider Note   CSN: [id number] Arrival date & time: 08/13/22  1055     History  Chief Complaint  Patient presents with   Shortness of Breath    Marcus Tran is a 71 y.o. male.   Shortness of Breath Patient is a 71 year old gentleman with past medical history significant for COPD, CHF, tobacco use, bilateral hilar adenopathy syndrome, CKD, CAD, sleep apnea   Patient has been in the emergency room today with complaint of worsening breathing that began this morning worsened over the course of the day.  He denies any lower extremity swelling.  Denies any chest pain.  He states he also is coughing frequently.  Denies any hemoptysis. Denies any chest pain.   He states that he does have 2 inhalers at home.  He states that he is uncertain what the names are.  He is uncertain if he was taking a steroid inhaler. No hx of intubations.      Home Medications Prior to Admission medications   Medication Sig Start Date End Date Taking? Authorizing Provider  nitroGLYCERIN (NITROSTAT) 0.4 MG SL tablet Place 1 tablet (0.4 mg total) under the tongue every 5 (five) minutes as needed for chest pain. 03/11/22  Yes Shawna Clamp, MD  potassium chloride SA (KLOR-CON M) 20 MEQ tablet Take 1 tablet (20 mEq total) by mouth daily. 07/09/22  Yes Simmons, Brittainy M, PA-C  albuterol (VENTOLIN HFA) 108 (90 Base) MCG/ACT inhaler Inhale 2 puffs into the lungs every 4 (four) hours as needed for wheezing or shortness of breath. 04/06/22   Hunsucker, Bonna Gains, MD  aspirin EC 81 MG tablet Take 1 tablet (81 mg total) by mouth daily. Swallow whole. Patient not taking: Reported on 08/12/2022 03/24/22   Loel Dubonnet, NP  atorvastatin (LIPITOR) 80 MG tablet Take 1 tablet (80 mg total) by mouth every evening. Patient taking differently: Take 80 mg by mouth daily. 03/24/22   Loel Dubonnet, NP  bisoprolol (ZEBETA) 5 MG tablet Take 1 tablet (5 mg total) by mouth  daily. 06/18/22   Lyda Jester M, PA-C  Budeson-Glycopyrrol-Formoterol (BREZTRI AEROSPHERE) 160-9-4.8 MCG/ACT AERO Inhale 2 puffs into the lungs as needed. 05/21/22   Hunsucker, Bonna Gains, MD  budesonide-formoterol (SYMBICORT) 160-4.5 MCG/ACT inhaler Inhale 1 puff into the lungs 2 (two) times daily.    [provider]  dapagliflozin propanediol (FARXIGA) 10 MG TABS tablet Take 1 tablet (10 mg total) by mouth daily. 03/24/22   Loel Dubonnet, NP  furosemide (LASIX) 40 MG tablet Take 1 tablet (40 mg total) by mouth 2 (two) times daily. 07/09/22   Lyda Jester M, PA-C  ipratropium-albuterol (DUONEB) 0.5-2.5 (3) MG/3ML SOLN Inhale 3 mLs into the lungs in the morning, at noon, in the evening, and at bedtime. 05/21/22   Hunsucker, Bonna Gains, MD  isosorbide-hydrALAZINE (BIDIL) 20-37.5 MG tablet Take 1 tablet by mouth 3 (three) times daily. 03/24/22   Loel Dubonnet, NP  loratadine (ALLERGY RELIEF) 10 MG tablet Take 10 mg by mouth at bedtime.    [provider]  metFORMIN (GLUCOPHAGE) 500 MG tablet Take 1 tablet (500 mg total) by mouth 2 (two) times daily with a meal. 05/07/22   Thurnell Lose, MD  montelukast (SINGULAIR) 10 MG tablet Take 10 mg by mouth at bedtime.    [provider]  sacubitril-valsartan (ENTRESTO) 97-103 MG Take 1 tablet by mouth 2 (two) times daily. 07/09/22   Lyda Jester M, PA-C  spironolactone (  ALDACTONE) 25 MG tablet Take 0.5 tablets (12.5 mg total) by mouth daily. 06/18/22   Consuelo Pandy, PA-C      Allergies    Patient has no known allergies.    Review of Systems   Review of Systems  Respiratory:  Positive for shortness of breath.     Physical Exam Updated Vital Signs BP 123/88   Pulse 80   Temp 98.2 F (36.8 C) (Oral)   Resp (!) 22   SpO2 97%  Physical Exam Vitals and nursing note reviewed.  HENT:     Head: Normocephalic and atraumatic.     Nose: Nose normal.  Eyes:     General: No scleral  icterus. Cardiovascular:     Rate and Rhythm: Normal rate and regular rhythm.     Pulses: Normal pulses.     Heart sounds: Normal heart sounds.  Pulmonary:     Effort: Pulmonary effort is normal. No respiratory distress.     Breath sounds: Wheezing present.     Comments: Coarse lung sounds with wheezing Abdominal:     Palpations: Abdomen is soft.     Tenderness: There is no abdominal tenderness.  Musculoskeletal:     Cervical back: Normal range of motion.     Right lower leg: No edema.     Left lower leg: No edema.     Comments: No lower extremity edema  Skin:    General: Skin is warm and dry.     Capillary Refill: Capillary refill takes less than 2 seconds.  Neurological:     Mental Status: He is alert. Mental status is at baseline.  Psychiatric:        Mood and Affect: Mood normal.        Behavior: Behavior normal.     ED Results / Procedures / Treatments   Labs (all labs ordered are listed, but only abnormal results are displayed) Labs Reviewed  COMPREHENSIVE METABOLIC PANEL - Abnormal; Notable for the following components:      Result Value   Glucose, Bld 123 (*)    Creatinine, Ser 1.92 (*)    Calcium 8.8 (*)    GFR, Estimated 37 (*)    All other components within normal limits  TROPONIN I (HIGH SENSITIVITY) - Abnormal; Notable for the following components:   Troponin I (High Sensitivity) 26 (*)    All other components within normal limits  TROPONIN I (HIGH SENSITIVITY) - Abnormal; Notable for the following components:   Troponin I (High Sensitivity) 29 (*)    All other components within normal limits  RESP PANEL BY RT-PCR (FLU A&B, COVID) ARPGX2  CBC  BRAIN NATRIURETIC PEPTIDE    EKG EKG Interpretation  Date/Time:  Thursday August 13 2022 15:20:35 EDT Ventricular Rate:  75 PR Interval:  166 QRS Duration: 137 QT Interval:  448 QTC Calculation: 501 R Axis:   5 Text Interpretation: Sinus rhythm RAE, consider biatrial enlargement Right bundle branch block  Abnrm T, consider ischemia, anterolateral lds Confirmed by Lacretia Leigh ([id number]) on 08/13/2022 7:05:45 PM  Radiology DG Chest 2 View  Result Date: 08/13/2022 CLINICAL DATA:  Shortness of breath for 2 days. EXAM: CHEST - 2 VIEW COMPARISON:  Chest radiograph from March 20, 2022 in May 05, 2022. FINDINGS: Stable top-normal heart size and fullness of bilateral hilar structures potentially related to pulmonary vascular congestion. No lobar consolidation. No pneumothorax. No sign of pleural effusion. On limited assessment there is no acute skeletal process. IMPRESSION: Stable top-normal heart size and  fullness of bilateral hilar structures potentially related to pulmonary vascular congestion. Radiographic appearance is stable compared to imaging from March 20, 2022. Electronically Signed   By: Zetta Bills M.D.   On: 08/13/2022 12:25    Procedures Procedures    Medications Ordered in ED Medications  ipratropium-albuterol (DUONEB) 0.5-2.5 (3) MG/3ML nebulizer solution 3 mL (has no administration in time range)  ipratropium-albuterol (DUONEB) 0.5-2.5 (3) MG/3ML nebulizer solution 3 mL (has no administration in time range)  magnesium sulfate IVPB 2 g 50 mL (has no administration in time range)  ipratropium-albuterol (DUONEB) 0.5-2.5 (3) MG/3ML nebulizer solution 3 mL (3 mLs Nebulization Given 08/13/22 1637)  albuterol (PROVENTIL) (2.5 MG/3ML) 0.083% nebulizer solution 2.5 mg (2.5 mg Nebulization Given 08/13/22 1637)  predniSONE (DELTASONE) tablet 40 mg (40 mg Oral Given 08/13/22 1637)  albuterol (VENTOLIN HFA) 108 (90 Base) MCG/ACT inhaler 4 puff (4 puffs Inhalation Given 08/13/22 1801)    ED Course/ Medical Decision Making/ A&P                           Medical Decision Making Amount and/or Complexity of Data Reviewed Labs: ordered. Radiology: ordered.  Risk Prescription drug management.   This patient presents to the ED for concern of SOB, this involves a number of treatment options, and  is a complaint that carries with it a high risk of complications and morbidity. A differential diagnosis was considered for the patient's symptoms which is discussed below:   The causes for shortness of breath include but are not limited to Cardiac (AHF, pericardial effusion and tamponade, arrhythmias, ischemia, etc) Respiratory (COPD, asthma, pneumonia, pneumothorax, primary pulmonary hypertension, PE/VQ mismatch) Hematological (anemia) Neuromuscular (ALS, Guillain-Barr, etc)    Co morbidities: Discussed in HPI   Brief History:  Patient is a 71 year old gentleman with past medical history significant for COPD, CHF, tobacco use, bilateral hilar adenopathy syndrome, CKD, CAD, sleep apnea   Patient has been in the emergency room today with complaint of worsening breathing that began this morning worsened over the course of the day.  He denies any lower extremity swelling.  Denies any chest pain.  He states he also is coughing frequently.  Denies any hemoptysis. Denies any chest pain.   He states that he does have 2 inhalers at home.  He states that he is uncertain what the names are.  He is uncertain if he was taking a steroid inhaler. No hx of intubations.     EMR reviewed including pt PMHx, past surgical history and past visits to ER.   See HPI for more details   Lab Tests:   I ordered and independently interpreted labs. Labs notable for mild worsening of creatinine at 1.92 today.  Troponin flat.  No chest pain.  CBC unremarkable.  COVID influenza negative.   Imaging Studies:  NAD. I personally reviewed all imaging studies and no acute abnormality found. I agree with radiology interpretation.  No acute changes on chest x-ray.  She does have chronic vascular congestion present.  Cardiac Monitoring:  The patient was maintained on a cardiac monitor.  I personally viewed and interpreted the cardiac monitored which showed an underlying rhythm of: NSR EKG non-ischemic but  with changes.    Medicines ordered:  I ordered medication including albuterol, DuoNeb, prednisone, magnesium for wheezing Reevaluation of the patient after these medicines showed that the patient improved I have reviewed the patients home medicines and have made adjustments as needed   Critical Interventions:  Consults/Attending Physician   I discussed this case with my attending physician who cosigned this note including patient's presenting symptoms, physical exam, and planned diagnostics and interventions. Attending physician stated agreement with plan or made changes to plan which were implemented.   Reevaluation:  After the interventions noted above I re-evaluated patient and found that they have :stayed the same  Patient ambulated and had desaturation to 85% on room air with ambulation.  He did not feel particularly short of breath and is requesting discharge.  I had a lengthy discussion with him that he will need to stay for additional treatment.  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 He states he will stay for additional treatment.   Social Determinants of Health:      Problem List / ED Course:  SOB - Patient ambulated and had desaturation to 85% on room air with ambulation.  He did not feel particularly short of breath and is requesting discharge.  I had a lengthy discussion with him that he will need to stay for additional treatment.  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 He states he will stay for additional treatment.   Dispostion:  Pt likely to leave AMA but will stay for continued treatment. Medications ordered.   Final Clinical Impression(s) / ED Diagnoses Final diagnoses:  None    Rx / DC Orders ED Discharge Orders     None         Tedd Sias, [place] 08/13/22 1920    Lacretia Leigh, MD 08/13/22 2150

## 2022-08-13 NOTE — Assessment & Plan Note (Signed)
Still smoking. Discussed need to quit. Nicotine patch while in hospital.

## 2022-08-13 NOTE — ED Notes (Signed)
Unable to establish an IV, IV team has been notified

## 2022-08-13 NOTE — ED Triage Notes (Signed)
Pt arrives from heart failure clinic with reports of congestion and increased SHOB. PT was started on 2L [place] by HF clinic. Does not wear O2 regularly. Denies pain.

## 2022-08-14 ENCOUNTER — Other Ambulatory Visit: Payer: Self-pay

## 2022-08-14 ENCOUNTER — Observation Stay (HOSPITAL_COMMUNITY): Payer: Medicare Other

## 2022-08-14 ENCOUNTER — Encounter (HOSPITAL_COMMUNITY): Payer: Self-pay | Admitting: Internal Medicine

## 2022-08-14 DIAGNOSIS — Z79899 Other long term (current) drug therapy: Secondary | ICD-10-CM | POA: Diagnosis not present

## 2022-08-14 DIAGNOSIS — I5022 Chronic systolic (congestive) heart failure: Secondary | ICD-10-CM | POA: Diagnosis present

## 2022-08-14 DIAGNOSIS — Z1152 Encounter for screening for COVID-19: Secondary | ICD-10-CM | POA: Diagnosis not present

## 2022-08-14 DIAGNOSIS — Z7951 Long term (current) use of inhaled steroids: Secondary | ICD-10-CM | POA: Diagnosis not present

## 2022-08-14 DIAGNOSIS — I2510 Atherosclerotic heart disease of native coronary artery without angina pectoris: Secondary | ICD-10-CM | POA: Diagnosis present

## 2022-08-14 DIAGNOSIS — J9601 Acute respiratory failure with hypoxia: Secondary | ICD-10-CM | POA: Diagnosis present

## 2022-08-14 DIAGNOSIS — E785 Hyperlipidemia, unspecified: Secondary | ICD-10-CM | POA: Diagnosis present

## 2022-08-14 DIAGNOSIS — Z8249 Family history of ischemic heart disease and other diseases of the circulatory system: Secondary | ICD-10-CM | POA: Diagnosis not present

## 2022-08-14 DIAGNOSIS — R0602 Shortness of breath: Secondary | ICD-10-CM | POA: Diagnosis present

## 2022-08-14 DIAGNOSIS — N1831 Chronic kidney disease, stage 3a: Secondary | ICD-10-CM | POA: Diagnosis present

## 2022-08-14 DIAGNOSIS — Z7984 Long term (current) use of oral hypoglycemic drugs: Secondary | ICD-10-CM | POA: Diagnosis not present

## 2022-08-14 DIAGNOSIS — N179 Acute kidney failure, unspecified: Secondary | ICD-10-CM | POA: Diagnosis present

## 2022-08-14 DIAGNOSIS — J441 Chronic obstructive pulmonary disease with (acute) exacerbation: Secondary | ICD-10-CM | POA: Diagnosis present

## 2022-08-14 DIAGNOSIS — Z7982 Long term (current) use of aspirin: Secondary | ICD-10-CM | POA: Diagnosis not present

## 2022-08-14 DIAGNOSIS — E1122 Type 2 diabetes mellitus with diabetic chronic kidney disease: Secondary | ICD-10-CM | POA: Diagnosis present

## 2022-08-14 DIAGNOSIS — I130 Hypertensive heart and chronic kidney disease with heart failure and stage 1 through stage 4 chronic kidney disease, or unspecified chronic kidney disease: Secondary | ICD-10-CM | POA: Diagnosis present

## 2022-08-14 DIAGNOSIS — F17210 Nicotine dependence, cigarettes, uncomplicated: Secondary | ICD-10-CM | POA: Diagnosis present

## 2022-08-14 LAB — CBC
HCT: 52.9 % — ABNORMAL HIGH (ref 39.0–52.0)
Hemoglobin: 17.1 g/dL — ABNORMAL HIGH (ref 13.0–17.0)
MCH: 30.3 pg (ref 26.0–34.0)
MCHC: 32.3 g/dL (ref 30.0–36.0)
MCV: 93.8 fL (ref 80.0–100.0)
Platelets: 187 10*3/uL (ref 150–400)
RBC: 5.64 MIL/uL (ref 4.22–5.81)
RDW: 14.3 % (ref 11.5–15.5)
WBC: 5.6 10*3/uL (ref 4.0–10.5)
nRBC: 0 % (ref 0.0–0.2)

## 2022-08-14 LAB — COMPREHENSIVE METABOLIC PANEL
ALT: 27 U/L (ref 0–44)
AST: 40 U/L (ref 15–41)
Albumin: 3.9 g/dL (ref 3.5–5.0)
Alkaline Phosphatase: 57 U/L (ref 38–126)
Anion gap: 15 (ref 5–15)
BUN: 25 mg/dL — ABNORMAL HIGH (ref 8–23)
CO2: 22 mmol/L (ref 22–32)
Calcium: 8.6 mg/dL — ABNORMAL LOW (ref 8.9–10.3)
Chloride: 97 mmol/L — ABNORMAL LOW (ref 98–111)
Creatinine, Ser: 2.13 mg/dL — ABNORMAL HIGH (ref 0.61–1.24)
GFR, Estimated: 32 mL/min — ABNORMAL LOW (ref >60)
Glucose, Bld: 148 mg/dL — ABNORMAL HIGH (ref 70–99)
Potassium: 4.4 mmol/L (ref 3.5–5.1)
Sodium: 134 mmol/L — ABNORMAL LOW (ref 135–145)
Total Bilirubin: 0.9 mg/dL (ref 0.3–1.2)
Total Protein: 7.6 g/dL (ref 6.5–8.1)

## 2022-08-14 LAB — CBG MONITORING, ED
Glucose-Capillary: 161 mg/dL — ABNORMAL HIGH (ref 70–99)
Glucose-Capillary: 173 mg/dL — ABNORMAL HIGH (ref 70–99)

## 2022-08-14 MED ORDER — ALBUTEROL (5 MG/ML) CONTINUOUS INHALATION SOLN
10.0000 mg/h | INHALATION_SOLUTION | RESPIRATORY_TRACT | Status: DC
  Administered 2022-08-14: 10 mg/h via RESPIRATORY_TRACT
  Filled 2022-08-14 (×2): qty 20

## 2022-08-14 MED ORDER — FUROSEMIDE 40 MG PO TABS
40.0000 mg | ORAL_TABLET | Freq: Every day | ORAL | Status: DC
  Administered 2022-08-15 – 2022-08-17 (×3): 40 mg via ORAL
  Filled 2022-08-14 (×3): qty 1

## 2022-08-14 MED ORDER — METHYLPREDNISOLONE SODIUM SUCC 125 MG IJ SOLR
120.0000 mg | INTRAMUSCULAR | Status: DC
  Administered 2022-08-14: 120 mg via INTRAVENOUS
  Filled 2022-08-14: qty 2

## 2022-08-14 MED ORDER — METHYLPREDNISOLONE SODIUM SUCC 125 MG IJ SOLR
60.0000 mg | Freq: Once | INTRAMUSCULAR | Status: AC
  Administered 2022-08-14: 60 mg via INTRAVENOUS
  Filled 2022-08-14: qty 2

## 2022-08-14 MED ORDER — FLUTICASONE FUROATE-VILANTEROL 200-25 MCG/ACT IN AEPB
2.0000 | INHALATION_SPRAY | Freq: Every day | RESPIRATORY_TRACT | Status: DC
  Administered 2022-08-15 – 2022-08-16 (×2): 2 via RESPIRATORY_TRACT
  Filled 2022-08-14: qty 28

## 2022-08-14 MED ORDER — PREDNISONE 20 MG PO TABS: 40.0000 mg | ORAL_TABLET | Freq: Every day | ORAL | Status: DC

## 2022-08-14 MED ORDER — FUROSEMIDE 20 MG PO TABS: 40.0000 mg | ORAL_TABLET | Freq: Every day | ORAL | Status: DC

## 2022-08-14 MED ORDER — BENZONATATE 100 MG PO CAPS
200.0000 mg | ORAL_CAPSULE | Freq: Three times a day (TID) | ORAL | Status: DC | PRN
  Administered 2022-08-14 – 2022-08-16 (×2): 200 mg via ORAL
  Filled 2022-08-14 (×2): qty 2

## 2022-08-14 MED ORDER — UMECLIDINIUM BROMIDE 62.5 MCG/ACT IN AEPB
1.0000 | INHALATION_SPRAY | Freq: Every day | RESPIRATORY_TRACT | Status: DC
  Administered 2022-08-15 – 2022-08-16 (×2): 1 via RESPIRATORY_TRACT
  Filled 2022-08-14: qty 7

## 2022-08-14 NOTE — Progress Notes (Signed)
Pt admitted for SOB, Pt placed on tele NSR, 6L oxygen [place]. No skin issues noted. BP 101/85 (BP Location: Right Arm)   Pulse 82   Temp 98 F (36.7 C) (Oral)   Resp 17   SpO2 (!) 89%  Bed in lowest position, bed locked and 2/4 rails up, call bell nearby. No other needs voiced at this time.  Louanne Skye 08/14/22 3:38 PM

## 2022-08-14 NOTE — Progress Notes (Signed)
Occupational Therapy Evaluation Patient Details Name: Marcus Tran MRN: [id number] DOB: May 15, 1951 Today's Date: 08/14/2022   History of Present Illness 71 yo male with onset of SOB was coughing, wheezing and had hx for several days.  Admitted 10/26, is now on O2, has hypoxia with mobility and referred to PT for follow up with mobility.  PMHx:  COPD, CHF, EF 35%, tobacco use, DM, CKD3, HTN, dyslipidemia,   Clinical Impression   Pt currently at supervision level overall for selfcare tasks and ADLs.  Oxygen sats decreasing down to 88% with mobilization in the hallway on 6Ls nasal cannula.  They dropped to 84% once mobilization was complete and pt was standing at the side of the bed.  Sats at 92-94% at rest in bed and sitting at EOB.  Feel he will benefit from acute care OT at this time for education on energy conservation strategies as well as continuing to work on building endurance for greater independence with selfcare tasks.         Recommendations for follow up therapy are one component of a multi-disciplinary discharge planning process, led by the attending physician.  Recommendations may be updated based on patient status, additional functional criteria and insurance authorization.   Follow Up Recommendations  No OT follow up    Assistance Recommended at Discharge None     Functional Status Assessment  Patient has had a recent decline in their functional status and demonstrates the ability to make significant improvements in function in a reasonable and predictable amount of time.  Equipment Recommendations  Other (comment) (Pt will purchase shower seat outside of hospital or borrow from family)       Precautions / Restrictions Precautions Precautions: Fall Precaution Comments: monitor sats Restrictions Weight Bearing Restrictions: No      Mobility Bed Mobility Overal bed mobility: Needs Assistance Bed Mobility: Supine to Sit     Supine to sit: Supervision           Transfers Overall transfer level: Needs assistance Equipment used: None Transfers: Sit to/from Stand Sit to Stand: Supervision                  Balance Overall balance assessment: Needs assistance Sitting-balance support: Feet supported Sitting balance-Leahy Scale: Good     Standing balance support: No upper extremity supported, During functional activity Standing balance-Leahy Scale: Fair Standing balance comment: No LOB with mobility                           ADL either performed or assessed with clinical judgement   ADL Overall ADL's : Needs assistance/impaired Eating/Feeding: Independent;Sitting   Grooming: Wash/dry hands;Wash/dry face;Modified independent   Upper Body Bathing: Set up;Sitting   Lower Body Bathing: Supervison/ safety;Sit to/from stand   Upper Body Dressing : Supervision/safety;Sitting Upper Body Dressing Details (indicate cue type and reason): simulated Lower Body Dressing: Supervision/safety   Toilet Transfer: Supervision/safety;Ambulation Toilet Transfer Details (indicate cue type and reason): simulated, pt declined need to toilet Toileting- Clothing Manipulation and Hygiene: Supervision/safety;Sit to/from stand       Functional mobility during ADLs: Supervision/safety General ADL Comments: Pt at 92-94% sats on 5Ls nasal cannula in sitting and resting.  Decreased to 88% on 6Ls with mobility and then down to 84% once mobility was stopped.  Dyspnea 2/4 with activity overall.  Min instructional cueing for purse lip breathing.  Recommend shower seat for home and pt will look to see if another family member  has one he can use.     Vision Baseline Vision/History: 0 No visual deficits Ability to See in Adequate Light: 0 Adequate Patient Visual Report: No change from baseline Vision Assessment?: No apparent visual deficits     Perception Perception Perception: Not tested   Praxis Praxis Praxis: Not tested    Pertinent  Vitals/Pain Pain Assessment Pain Assessment: No/denies pain     Hand Dominance Right   Extremity/Trunk Assessment Upper Extremity Assessment Upper Extremity Assessment: LUE deficits/detail LUE Deficits / Details: shoulder flexion 3+/5 all other joints 4/5 throughout.  fith digit DIP contracture noted. LUE Sensation: WNL LUE Coordination: WNL   Lower Extremity Assessment Lower Extremity Assessment: Overall WFL for tasks assessed   Cervical / Trunk Assessment Cervical / Trunk Assessment: Kyphotic   Communication Communication Communication: No difficulties   Cognition Arousal/Alertness: Awake/alert Behavior During Therapy: WFL for tasks assessed/performed Overall Cognitive Status: Within Functional Limits for tasks assessed                                       General Comments  pt reviewed high level balance skills with contact help offered as needed.  Pt automatically reaches for support for narrow base activity            Home Living Family/patient expects to be discharged to:: Private residence Living Arrangements: Children;Other relatives Available Help at Discharge: Available PRN/intermittently Type of Home: House Home Access: Stairs to enter Entrance Stairs-Number of Steps: 1   Home Layout: One level     Bathroom Shower/Tub: Tub/shower unit;Curtain   Biochemist, clinical: Standard     Home Equipment: Cane - single point   Additional Comments: has offer of some equipment from another person who is clearing out an estate      Prior Functioning/Environment Prior Level of Function : Independent/Modified Independent             Mobility Comments: SPC at home ADLs Comments: Perfoms all ADLs        OT Problem List: Decreased activity tolerance;Impaired balance (sitting and/or standing)      OT Treatment/Interventions: Self-care/ADL training;Balance training;Energy conservation;DME and/or AE instruction    OT Goals(Current goals can be  found in the care plan section) Acute Rehab OT Goals Patient Stated Goal: Pt hopes to go back home without oxygen OT Goal Formulation: With patient Time For Goal Achievement: 08/28/22 Potential to Achieve Goals: Good  OT Frequency: Min 2X/week       AM-PAC OT "6 Clicks" Daily Activity     Outcome Measure Help from another person eating meals?: None Help from another person taking care of personal grooming?: None Help from another person toileting, which includes using toliet, bedpan, or urinal?: A Little Help from another person bathing (including washing, rinsing, drying)?: A Little Help from another person to put on and taking off regular upper body clothing?: None Help from another person to put on and taking off regular lower body clothing?: A Little 6 Click Score: 21   End of Session Equipment Utilized During Treatment: Gait belt Nurse Communication: Mobility status  Activity Tolerance: Patient tolerated treatment well Patient left: in bed;Other (comment) (with PT for next session)  OT Visit Diagnosis: Muscle weakness (generalized) (M62.81);Unsteadiness on feet (R26.81)                Time: 9417-4081 OT Time Calculation (min): 41 min Charges:  OT General Charges $OT Visit:  1 Visit OT Evaluation $OT Eval Moderate Complexity: 1 Mod OT Treatments $Self Care/Home Management : 23-37 mins  Kayana Thoen OTR/L 08/14/2022, 1:37 PM

## 2022-08-14 NOTE — Progress Notes (Signed)
Patient refused use of BIPAP for the evening, stated that he is breathing better and does not need it. RT will continue to monitor

## 2022-08-14 NOTE — Progress Notes (Signed)
PROGRESS NOTE    Marcus Tran  YIR:[id number] DOB: 1951/05/20 DOA: 08/13/2022 PCP: Hillery Aldo, NP  71/M with history of chronic systolic CHF, EF 50%, advanced COPD, ongoing tobacco abuse, type 2 diabetes mellitus, CKD 3 ,hypertension, dyslipidemia, presented to the ED with productive cough, wheezing and shortness of breath.  Denies any increased swelling, has some orthopnea which is unchanged. -Work-up in the ED noted BNP of 148, troponin 26, creatinine 1.9, chest x-ray without acute findings, COVID and respiratory virus panel was negative, had increased respiratory distress and was started on BiPAP overnight   Subjective: -Breathing is starting to improve  Assessment and Plan:  Acute hypoxic respiratory failure COPD exacerbation -Attempted wean off BiPAP today -Stop prednisone, restart IV Solu-Medrol -Continue DuoNebs, add ceftriaxone and azithromycin -Flu and COVID PCR negative -Continue Breztri -Wean O2 as tolerated  Chronic systolic CHF -EF 09% at baseline -Clinically appears euvolemic, discharge weight unchanged from recent CHF admission -Continue oral Lasix, Jardiance, Aldactone, Zebeta  -Creatinine been higher than baseline we will hold Entresto today -BiDil continued  Tobacco abuse Still smoking. -Counseled, nicotine patch  DM2 (diabetes mellitus, type 2) (HCC) Continue Farxiga -CBGs are stable, monitor on steroids, continue sliding scale insulin  AKI on CKD 3 a -Creatinine higher than baseline, will hold Entresto today -Appears euvolemic  HTN (hypertension) -Stable, meds as above  DVT prophylaxis: Lovenox Code Status: Full code Family Communication: No family at bedside Disposition Plan: Home in 1 to 2 days  Consultants:    Procedures:   Antimicrobials:    Objective: Vitals:   08/14/22 0745 08/14/22 0800 08/14/22 0815 08/14/22 0830  BP: 128/89 (!) 137/90 122/86 (!) 135/97  Pulse: 79 78 74 82  Resp: (!) 23 20 16  (!) 23  Temp:      TempSrc:       SpO2: 97% 94% 90% 96%    Intake/Output Summary (Last 24 hours) at 08/14/2022 0942 Last data filed at 08/14/2022 0336 Gross per 24 hour  Intake --  Output 1000 ml  Net -1000 ml   There were no vitals filed for this visit.  Examination:  General exam: Chronically ill male sitting up in bed, BiPAP on, AAO x2, HEENT: No JVD CVS: S1-S2, regular rhythm Lungs: Few expiratory wheezes, poor air movement bilaterally Abdomen: Soft, nontender, bowel sounds present Extremities: No edema Skin: No rashes Psychiatry:  Mood & affect appropriate.     Data Reviewed:   CBC: Recent Labs  Lab 08/13/22 1147 08/14/22 0520  WBC 5.1 5.6  HGB 16.6 17.1*  HCT 50.9 52.9*  MCV 92.4 93.8  PLT 176 187   Basic Metabolic Panel: Recent Labs  Lab 08/13/22 1147 08/14/22 0520  NA 137 134*  K 4.0 4.4  CL 98 97*  CO2 26 22  GLUCOSE 123* 148*  BUN 21 25*  CREATININE 1.92* 2.13*  CALCIUM 8.8* 8.6*   GFR: Estimated Creatinine Clearance: 27.3 mL/min (A) (by C-G formula based on SCr of 2.13 mg/dL (H)). Liver Function Tests: Recent Labs  Lab 08/13/22 1147 08/14/22 0520  AST 37 40  ALT 26 27  ALKPHOS 55 57  BILITOT 0.9 0.9  PROT 7.3 7.6  ALBUMIN 3.9 3.9   No results for input(s): "LIPASE", "AMYLASE" in the last 168 hours. No results for input(s): "AMMONIA" in the last 168 hours. Coagulation Profile: No results for input(s): "INR", "PROTIME" in the last 168 hours. Cardiac Enzymes: No results for input(s): "CKTOTAL", "CKMB", "CKMBINDEX", "TROPONINI" in the last 168 hours. BNP (last 3 results)  No results for input(s): "PROBNP" in the last 8760 hours. HbA1C: No results for input(s): "HGBA1C" in the last 72 hours. CBG: Recent Labs  Lab 08/14/22 0826  GLUCAP 161*   Lipid Profile: No results for input(s): "CHOL", "HDL", "LDLCALC", "TRIG", "CHOLHDL", "LDLDIRECT" in the last 72 hours. Thyroid Function Tests: No results for input(s): "TSH", "T4TOTAL", "FREET4", "T3FREE", "THYROIDAB"  in the last 72 hours. Anemia Panel: No results for input(s): "VITAMINB12", "FOLATE", "FERRITIN", "TIBC", "IRON", "RETICCTPCT" in the last 72 hours. Urine analysis:    Component Value Date/Time   COLORURINE STRAW (A) 03/21/2022 1545   APPEARANCEUR CLEAR 03/21/2022 1545   LABSPEC 1.005 03/21/2022 1545   PHURINE 5.0 03/21/2022 1545   GLUCOSEU >=500 (A) 03/21/2022 1545   HGBUR NEGATIVE 03/21/2022 1545   BILIRUBINUR NEGATIVE 03/21/2022 1545   KETONESUR NEGATIVE 03/21/2022 1545   PROTEINUR NEGATIVE 03/21/2022 1545   NITRITE NEGATIVE 03/21/2022 1545   LEUKOCYTESUR NEGATIVE 03/21/2022 1545   Sepsis Labs: @LABRCNTIP (procalcitonin:4,lacticidven:4)  ) Recent Results (from the past 240 hour(s))  Resp Panel by RT-PCR (Flu A&B, Covid) Anterior Nasal Swab     Status: None   Collection Time: 08/13/22 11:27 AM   Specimen: Anterior Nasal Swab  Result Value Ref Range Status   SARS Coronavirus 2 by RT PCR NEGATIVE NEGATIVE Final    Comment: (NOTE) SARS-CoV-2 target nucleic acids are NOT DETECTED.  The SARS-CoV-2 RNA is generally detectable in upper respiratory specimens during the acute phase of infection. The lowest concentration of SARS-CoV-2 viral copies this assay can detect is 138 copies/mL. A negative result does not preclude SARS-Cov-2 infection and should not be used as the sole basis for treatment or other patient management decisions. A negative result may occur with  improper specimen collection/handling, submission of specimen other than nasopharyngeal swab, presence of viral mutation(s) within the areas targeted by this assay, and inadequate number of viral copies(<138 copies/mL). A negative result must be combined with clinical observations, patient history, and epidemiological information. The expected result is Negative.  Fact Sheet for Patients:  08/15/22  Fact Sheet for Healthcare Providers:   BloggerCourse.com  This test is no t yet approved or cleared by the [employer] FDA and  has been authorized for detection and/or diagnosis of SARS-CoV-2 by FDA under an Emergency Use Authorization (EUA). This EUA will remain  in effect (meaning this test can be used) for the duration of the COVID-19 declaration under Section 564(b)(1) of the Act, 21 U.S.C.section 360bbb-3(b)(1), unless the authorization is terminated  or revoked sooner.       Influenza A by PCR NEGATIVE NEGATIVE Final   Influenza B by PCR NEGATIVE NEGATIVE Final    Comment: (NOTE) The Xpert Xpress SARS-CoV-2/FLU/RSV plus assay is intended as an aid in the diagnosis of influenza from Nasopharyngeal swab specimens and should not be used as a sole basis for treatment. Nasal washings and aspirates are unacceptable for Xpert Xpress SARS-CoV-2/FLU/RSV testing.  Fact Sheet for Patients: [place]  Fact Sheet for Healthcare Providers: BloggerCourse.com  This test is not yet approved or cleared by the [employer] FDA and has been authorized for detection and/or diagnosis of SARS-CoV-2 by FDA under an Emergency Use Authorization (EUA). This EUA will remain in effect (meaning this test can be used) for the duration of the COVID-19 declaration under Section 564(b)(1) of the Act, 21 U.S.C. section 360bbb-3(b)(1), unless the authorization is terminated or revoked.  Performed at [facility], [address] [place], [place]  Radiology Studies: DG CHEST PORT 1 VIEW  Result Date: 08/14/2022 CLINICAL DATA:  Respiratory distress, shortness of breath. EXAM: PORTABLE CHEST 1 VIEW COMPARISON:  08/13/2022. FINDINGS: The heart is normal in size. There is stable fullness at the right hilum, similar in appearance to the prior exam. No consolidation, effusion, or pneumothorax. No acute osseous abnormality. IMPRESSION: No  active disease. Electronically Signed   By: Brett Fairy M.D.   On: 08/14/2022 03:40   DG Chest 2 View  Result Date: 08/13/2022 CLINICAL DATA:  Shortness of breath for 2 days. EXAM: CHEST - 2 VIEW COMPARISON:  Chest radiograph from March 20, 2022 in May 05, 2022. FINDINGS: Stable top-normal heart size and fullness of bilateral hilar structures potentially related to pulmonary vascular congestion. No lobar consolidation. No pneumothorax. No sign of pleural effusion. On limited assessment there is no acute skeletal process. IMPRESSION: Stable top-normal heart size and fullness of bilateral hilar structures potentially related to pulmonary vascular congestion. Radiographic appearance is stable compared to imaging from March 20, 2022. Electronically Signed   By: Zetta Bills M.D.   On: 08/13/2022 12:25     Scheduled Meds:  aspirin EC  81 mg Oral Daily   atorvastatin  80 mg Oral QPM   bisoprolol  5 mg Oral Daily   dapagliflozin propanediol  10 mg Oral Daily   enoxaparin (LOVENOX) injection  40 mg Subcutaneous Q24H   fluticasone furoate-vilanterol  2 puff Inhalation Daily   And   umeclidinium bromide  1 puff Inhalation Daily   furosemide  40 mg Oral Daily   ipratropium-albuterol  3 mL Inhalation Q6H   isosorbide-hydrALAZINE  1 tablet Oral TID   loratadine  10 mg Oral q AM   methylPREDNISolone (SOLU-MEDROL) injection  120 mg Intravenous Q24H   montelukast  10 mg Oral QHS   nicotine  7 mg Transdermal Daily   potassium chloride SA  20 mEq Oral Daily   sacubitril-valsartan  1 tablet Oral BID   spironolactone  12.5 mg Oral Daily   Continuous Infusions:  albuterol 10 mg/hr (08/14/22 0145)   cefTRIAXone (ROCEPHIN)  IV Stopped (08/14/22 0101)     LOS: 0 days    Time spent: 1min    Domenic Polite, MD [facility]   08/14/2022, 9:42 AM

## 2022-08-14 NOTE — Progress Notes (Signed)
Physical Therapy Evaluation Patient Details Name: Marcus Tran MRN: [id number] DOB: 16-Jan-1951 Today's Date: 08/14/2022  History of Present Illness  71 yo male with onset of SOB was coughing, wheezing and had hx for several days.  Admitted 10/26, is now on O2, has hypoxia with mobility and referred to PT for follow up with mobility.  PMHx:  COPD, CHF, EF 35%, tobacco use, DM, CKD3, HTN, dyslipidemia,  Clinical Impression  Pt was seen for progressing mobility after walking with OT and desaturating to 84% after walk, recovered very slowly on 5L.  Pt is demonstrating awareness of his balance level, automatically reaching for support to perform several high level berg skills.  Will recommend a rollator for support and for managing O2, since he is likely to take it home with him.  Will also have the option to sit often if needed.  Family will be home with pt to assist him per his report, and will have HHPT follow to support his recovery.       Recommendations for follow up therapy are one component of a multi-disciplinary discharge planning process, led by the attending physician.  Recommendations may be updated based on patient status, additional functional criteria and insurance authorization.  Follow Up Recommendations Home health PT      Assistance Recommended at Discharge Intermittent Supervision/Assistance  Patient can return home with the following  A little help with walking and/or transfers;Assistance with cooking/housework;Assist for transportation;Help with stairs or ramp for entrance    Equipment Recommendations Rollator (4 wheels)  Recommendations for Other Services       Functional Status Assessment Patient has had a recent decline in their functional status and demonstrates the ability to make significant improvements in function in a reasonable and predictable amount of time.     Precautions / Restrictions Precautions Precautions: Fall Precaution Comments: monitor sats       Mobility  Bed Mobility Overal bed mobility: Needs Assistance Bed Mobility: Sit to Supine       Sit to supine: Min assist   General bed mobility comments: min assist due to magnitude of blankets and objects/lines on bed    Transfers Overall transfer level: Needs assistance Equipment used: None Transfers: Sit to/from Stand Sit to Stand: Supervision           General transfer comment: no LOB with mobility    Ambulation/Gait Ambulation/Gait assistance: Min guard Gait Distance (Feet): 12 Feet Assistive device: None Gait Pattern/deviations: Step-to pattern, Wide base of support Gait velocity: reduced Gait velocity interpretation: <1.31 ft/sec, indicative of household ambulator   General Gait Details: pt is in room as he desaturated when he walked on the hall with the OT  Stairs            Wheelchair Mobility    Modified Rankin (Stroke Patients Only)       Balance Overall balance assessment: Needs assistance Sitting-balance support: Feet supported Sitting balance-Leahy Scale: Good     Standing balance support: No upper extremity supported, During functional activity Standing balance-Leahy Scale: Fair                               Pertinent Vitals/Pain Pain Assessment Pain Assessment: No/denies pain    Home Living Family/patient expects to be discharged to:: Private residence Living Arrangements: Children;Other relatives Available Help at Discharge: Available PRN/intermittently Type of Home: House Home Access: Stairs to enter   Entrance Stairs-Number of Steps: 1  Home Layout: One level Home Equipment: Cane - single point Additional Comments: has offer of some equipment from another person who is clearing out an estate    Prior Function Prior Level of Function : Independent/Modified Independent             Mobility Comments: SPC at home ADLs Comments: Perfoms all ADLs     Hand Dominance   Dominant Hand: Right     Extremity/Trunk Assessment   Upper Extremity Assessment Upper Extremity Assessment: Defer to OT evaluation    Lower Extremity Assessment Lower Extremity Assessment: Overall WFL for tasks assessed    Cervical / Trunk Assessment Cervical / Trunk Assessment: Kyphotic (mild)  Communication   Communication: No difficulties  Cognition Arousal/Alertness: Awake/alert Behavior During Therapy: WFL for tasks assessed/performed Overall Cognitive Status: Within Functional Limits for tasks assessed                                 General Comments: has been good with history        General Comments General comments (skin integrity, edema, etc.): pt reviewed high level balance skills with contact help offered as needed.  Pt automatically reaches for support for narrow base activity    Exercises     Assessment/Plan    PT Assessment Patient needs continued PT services  PT Problem List Decreased strength;Decreased activity tolerance;Decreased balance;Decreased mobility;Decreased coordination;Cardiopulmonary status limiting activity       PT Treatment Interventions DME instruction;Gait training;Functional mobility training;Therapeutic activities;Therapeutic exercise;Balance training;Neuromuscular re-education;Patient/family education;Stair training    PT Goals (Current goals can be found in the Care Plan section)  Acute Rehab PT Goals Patient Stated Goal: to get home and feel better PT Goal Formulation: With patient Time For Goal Achievement: 08/21/22 Potential to Achieve Goals: Good    Frequency Min 3X/week     Co-evaluation               AM-PAC PT "6 Clicks" Mobility  Outcome Measure Help needed turning from your back to your side while in a flat bed without using bedrails?: A Little Help needed moving from lying on your back to sitting on the side of a flat bed without using bedrails?: A Little Help needed moving to and from a bed to a chair (including a  wheelchair)?: A Little Help needed standing up from a chair using your arms (e.g., wheelchair or bedside chair)?: A Little Help needed to walk in hospital room?: A Little Help needed climbing 3-5 steps with a railing? : A Lot 6 Click Score: 17    End of Session Equipment Utilized During Treatment: Gait belt;Oxygen Activity Tolerance: Patient limited by fatigue;Treatment limited secondary to medical complications (Comment) Patient left: in bed;with call bell/phone within reach Nurse Communication: Mobility status;Other (comment) (O2 consumption wtih mobility) PT Visit Diagnosis: Unsteadiness on feet (R26.81);Difficulty in walking, not elsewhere classified (R26.2)    Time: 4481-8563 PT Time Calculation (min) (ACUTE ONLY): 19 min   Charges:   PT Evaluation $PT Eval Moderate Complexity: 1 Mod         Ramond Dial 08/14/2022, 12:52 PM  Mee Hives, PT PhD Acute Rehab Dept. Number: [place] and [place]

## 2022-08-14 NOTE — Progress Notes (Addendum)
Patient with worsening respiratory status following severe coughing fit.  Increased O2 requirement.  On exam severe wheezing and very prolonged expiratory phase, splinting, severe coughing productive of mucus.  Getting continuous neb treatment right now. Repeat CXR to make sure still no evidence of pulmonary edema, though I still favor COPD > CHF at this point clinically. His cardiologist also suspects pulmonary (not primarily cardiac) cause for his respiratory symptoms when he saw the patient just 3 days ago (See Dr. Rosezella [place] note from 10/23) Tessalon PRN cough suppression Ordering solumedrol 60mg  IV x1 now Dont know that he will tolerate BIPAP at the moment with all the coughing, but willing to give it a try if not rapidly improving with neb treatment.

## 2022-08-14 NOTE — ED Notes (Signed)
0200 patient sat up in bed coughing, he had removed the neb treatment.  He has noted increased work of breathing and resp distress.  Lungs with ongoing wheezing and minimal air movement.  He is able to say a few words at a time.  Neb treatment paused due to patient intolerance.  Message sent to the ED regarding patient condition.

## 2022-08-15 LAB — HIV ANTIBODY (ROUTINE TESTING W REFLEX): HIV Screen 4th Generation wRfx: NONREACTIVE

## 2022-08-15 LAB — BASIC METABOLIC PANEL
Anion gap: 11 (ref 5–15)
BUN: 34 mg/dL — ABNORMAL HIGH (ref 8–23)
CO2: 28 mmol/L (ref 22–32)
Calcium: 8.6 mg/dL — ABNORMAL LOW (ref 8.9–10.3)
Chloride: 94 mmol/L — ABNORMAL LOW (ref 98–111)
Creatinine, Ser: 1.84 mg/dL — ABNORMAL HIGH (ref 0.61–1.24)
GFR, Estimated: 38 mL/min — ABNORMAL LOW (ref >60)
Glucose, Bld: 184 mg/dL — ABNORMAL HIGH (ref 70–99)
Potassium: 4.2 mmol/L (ref 3.5–5.1)
Sodium: 133 mmol/L — ABNORMAL LOW (ref 135–145)

## 2022-08-15 LAB — CBC
HCT: 44.1 % (ref 39.0–52.0)
Hemoglobin: 14.6 g/dL (ref 13.0–17.0)
MCH: 30.3 pg (ref 26.0–34.0)
MCHC: 33.1 g/dL (ref 30.0–36.0)
MCV: 91.5 fL (ref 80.0–100.0)
Platelets: 172 10*3/uL (ref 150–400)
RBC: 4.82 MIL/uL (ref 4.22–5.81)
RDW: 14.1 % (ref 11.5–15.5)
WBC: 12.6 10*3/uL — ABNORMAL HIGH (ref 4.0–10.5)
nRBC: 0 % (ref 0.0–0.2)

## 2022-08-15 LAB — GLUCOSE, CAPILLARY
Glucose-Capillary: 209 mg/dL — ABNORMAL HIGH (ref 70–99)
Glucose-Capillary: 148 mg/dL — ABNORMAL HIGH (ref 70–99)

## 2022-08-15 MED ORDER — METHYLPREDNISOLONE SODIUM SUCC 40 MG IJ SOLR
40.0000 mg | Freq: Two times a day (BID) | INTRAMUSCULAR | Status: DC
  Administered 2022-08-15 – 2022-08-17 (×5): 40 mg via INTRAVENOUS
  Filled 2022-08-15 (×5): qty 1

## 2022-08-15 MED ORDER — CALCIUM CARBONATE ANTACID 500 MG PO CHEW
1.0000 | CHEWABLE_TABLET | Freq: Two times a day (BID) | ORAL | Status: DC
  Administered 2022-08-15 – 2022-08-17 (×6): 200 mg via ORAL
  Filled 2022-08-15 (×6): qty 1

## 2022-08-15 MED ORDER — IPRATROPIUM-ALBUTEROL 0.5-2.5 (3) MG/3ML IN SOLN
3.0000 mL | Freq: Three times a day (TID) | RESPIRATORY_TRACT | Status: DC
  Administered 2022-08-16 (×2): 3 mL via RESPIRATORY_TRACT
  Filled 2022-08-15 (×2): qty 3

## 2022-08-15 NOTE — Progress Notes (Signed)
PROGRESS NOTE    Marcus Tran  [id number] DOB: 07/28/1951 DOA: 08/13/2022 PCP: Cipriano Mile, NP  71/M with history of chronic systolic CHF, EF [id number], advanced COPD, ongoing tobacco abuse, type 2 diabetes mellitus, CKD 3 ,hypertension, dyslipidemia, presented to the ED with productive cough, wheezing and shortness of breath.  Denies any increased swelling, has some orthopnea which is unchanged. -Work-up in the ED noted BNP of 148, troponin 26, creatinine 1.9, chest x-ray without acute findings, COVID and respiratory virus panel was negative, had increased respiratory distress and was started on BiPAP overnight   Subjective: -Feels better, breathing is slowly improving, still requiring oxygen  Assessment and Plan:  Acute hypoxic respiratory failure COPD exacerbation -Off BiPAP -Still wheezing, continue IV Solu-Medrol, DuoNebs, ceftriaxone -Flu and COVID PCR negative -Continue Breztri -To wean off O2 today  Chronic systolic CHF -EF [id number] at baseline -Clinically appears euvolemic, discharge weight unchanged from recent CHF admission -Continue oral Lasix, Jardiance, Aldactone, Zebeta  -Creatinine been higher than baseline, holding Entresto -BiDil continued  Tobacco abuse Still smoking. -Counseled, nicotine patch  DM2 (diabetes mellitus, type 2) (HCC) Continue Farxiga -CBGs are stable, monitor on steroids, continue sliding scale insulin  AKI on CKD 3 a -Creatinine higher than baseline, will hold Entresto today -Appears euvolemic, now improving, BMP in a.m.  HTN (hypertension) -Stable, meds as above  DVT prophylaxis: Lovenox Code Status: Full code Family Communication: No family at bedside Disposition Plan: Home in 1 to 2 days  Consultants:    Procedures:   Antimicrobials:    Objective: Vitals:   08/14/22 2028 08/14/22 2053 08/15/22 0433 08/15/22 0905  BP: 127/89  114/67   Pulse:  83 73 71  Resp: 19 18 20 18   Temp: 98 F (36.7 C)  98.2 F (36.8 C)    TempSrc: Oral     SpO2: 90% 94% 94% 94%    Intake/Output Summary (Last 24 hours) at 08/15/2022 0958 Last data filed at 08/15/2022 0900 Gross per 24 hour  Intake 120 ml  Output 700 ml  Net -580 ml   There were no vitals filed for this visit.  Examination:  General exam: Elderly chronically ill male sitting up in bed, off BiPAP, AAOx3 HEENT: No JVD CVS: S1-S2, regular rhythm Lungs: Improving air movement, bilateral expiratory wheezes Abdomen: Soft, nontender, bowel sounds present Extremities: No edema  Skin: No rashes Psychiatry:  Mood & affect appropriate.     Data Reviewed:   CBC: Recent Labs  Lab 08/13/22 1147 08/14/22 0520 08/15/22 0535  WBC 5.1 5.6 12.6*  HGB 16.6 17.1* 14.6  HCT 50.9 52.9* 44.1  MCV 92.4 93.8 91.5  PLT 176 187 [id number]   Basic Metabolic Panel: Recent Labs  Lab 08/13/22 1147 08/14/22 0520 08/15/22 0535  NA 137 134* 133*  K 4.0 4.4 4.2  CL 98 97* 94*  CO2 26 22 28   GLUCOSE 123* 148* 184*  BUN 21 25* 34*  CREATININE 1.92* 2.13* 1.84*  CALCIUM 8.8* 8.6* 8.6*   GFR: Estimated Creatinine Clearance: 31.6 mL/min (A) (by C-G formula based on SCr of 1.84 mg/dL (H)). Liver Function Tests: Recent Labs  Lab 08/13/22 1147 08/14/22 0520  AST 37 40  ALT 26 27  ALKPHOS 55 57  BILITOT 0.9 0.9  PROT 7.3 7.6  ALBUMIN 3.9 3.9   No results for input(s): "LIPASE", "AMYLASE" in the last 168 hours. No results for input(s): "AMMONIA" in the last 168 hours. Coagulation Profile: No results for input(s): "INR", "PROTIME" in the last 168  hours. Cardiac Enzymes: No results for input(s): "CKTOTAL", "CKMB", "CKMBINDEX", "TROPONINI" in the last 168 hours. BNP (last 3 results) No results for input(s): "PROBNP" in the last 8760 hours. HbA1C: No results for input(s): "HGBA1C" in the last 72 hours. CBG: Recent Labs  Lab 08/14/22 0826 08/14/22 1218  GLUCAP 161* 173*   Lipid Profile: No results for input(s): "CHOL", "HDL", "LDLCALC", "TRIG", "CHOLHDL",  "LDLDIRECT" in the last 72 hours. Thyroid Function Tests: No results for input(s): "TSH", "T4TOTAL", "FREET4", "T3FREE", "THYROIDAB" in the last 72 hours. Anemia Panel: No results for input(s): "VITAMINB12", "FOLATE", "FERRITIN", "TIBC", "IRON", "RETICCTPCT" in the last 72 hours. Urine analysis:    Component Value Date/Time   COLORURINE STRAW (A) 03/21/2022 1545   APPEARANCEUR CLEAR 03/21/2022 1545   LABSPEC 1.005 03/21/2022 1545   PHURINE 5.0 03/21/2022 1545   GLUCOSEU >=500 (A) 03/21/2022 1545   HGBUR NEGATIVE 03/21/2022 1545   BILIRUBINUR NEGATIVE 03/21/2022 1545   KETONESUR NEGATIVE 03/21/2022 1545   PROTEINUR NEGATIVE 03/21/2022 1545   NITRITE NEGATIVE 03/21/2022 1545   LEUKOCYTESUR NEGATIVE 03/21/2022 1545   Sepsis Labs: @LABRCNTIP (procalcitonin:4,lacticidven:4)  ) Recent Results (from the past 240 hour(s))  Resp Panel by RT-PCR (Flu A&B, Covid) Anterior Nasal Swab     Status: None   Collection Time: 08/13/22 11:27 AM   Specimen: Anterior Nasal Swab  Result Value Ref Range Status   SARS Coronavirus 2 by RT PCR NEGATIVE NEGATIVE Final    Comment: (NOTE) SARS-CoV-2 target nucleic acids are NOT DETECTED.  The SARS-CoV-2 RNA is generally detectable in upper respiratory specimens during the acute phase of infection. The lowest concentration of SARS-CoV-2 viral copies this assay can detect is 138 copies/mL. A negative result does not preclude SARS-Cov-2 infection and should not be used as the sole basis for treatment or other patient management decisions. A negative result may occur with  improper specimen collection/handling, submission of specimen other than nasopharyngeal swab, presence of viral mutation(s) within the areas targeted by this assay, and inadequate number of viral copies(<138 copies/mL). A negative result must be combined with clinical observations, patient history, and epidemiological information. The expected result is Negative.  Fact Sheet for  Patients:  EntrepreneurPulse.com.au  Fact Sheet for Healthcare Providers:  IncredibleEmployment.be  This test is no t yet approved or cleared by the [place] FDA and  has been authorized for detection and/or diagnosis of SARS-CoV-2 by FDA under an Emergency Use Authorization (EUA). This EUA will remain  in effect (meaning this test can be used) for the duration of the COVID-19 declaration under Section 564(b)(1) of the Act, 21 U.S.C.section 360bbb-3(b)(1), unless the authorization is terminated  or revoked sooner.       Influenza A by PCR NEGATIVE NEGATIVE Final   Influenza B by PCR NEGATIVE NEGATIVE Final    Comment: (NOTE) The Xpert Xpress SARS-CoV-2/FLU/RSV plus assay is intended as an aid in the diagnosis of influenza from Nasopharyngeal swab specimens and should not be used as a sole basis for treatment. Nasal washings and aspirates are unacceptable for Xpert Xpress SARS-CoV-2/FLU/RSV testing.  Fact Sheet for Patients: EntrepreneurPulse.com.au  Fact Sheet for Healthcare Providers: IncredibleEmployment.be  This test is not yet approved or cleared by the [place] FDA and has been authorized for detection and/or diagnosis of SARS-CoV-2 by FDA under an Emergency Use Authorization (EUA). This EUA will remain in effect (meaning this test can be used) for the duration of the COVID-19 declaration under Section 564(b)(1) of the Act, 21 U.S.C. section 360bbb-3(b)(1), unless the authorization  is terminated or revoked.  Performed at [facility], [place] [address] [place], [place] [address]      Radiology Studies: DG CHEST PORT 1 VIEW  Result Date: 08/14/2022 CLINICAL DATA:  Respiratory distress, shortness of breath. EXAM: PORTABLE CHEST 1 VIEW COMPARISON:  08/13/2022. FINDINGS: The heart is normal in size. There is stable fullness at the right hilum, similar in appearance to the prior exam.  No consolidation, effusion, or pneumothorax. No acute osseous abnormality. IMPRESSION: No active disease. Electronically Signed   By: Brett Fairy M.D.   On: 08/14/2022 03:40   DG Chest 2 View  Result Date: 08/13/2022 CLINICAL DATA:  Shortness of breath for 2 days. EXAM: CHEST - 2 VIEW COMPARISON:  Chest radiograph from March 20, 2022 in May 05, 2022. FINDINGS: Stable top-normal heart size and fullness of bilateral hilar structures potentially related to pulmonary vascular congestion. No lobar consolidation. No pneumothorax. No sign of pleural effusion. On limited assessment there is no acute skeletal process. IMPRESSION: Stable top-normal heart size and fullness of bilateral hilar structures potentially related to pulmonary vascular congestion. Radiographic appearance is stable compared to imaging from March 20, 2022. Electronically Signed   By: Zetta Bills M.D.   On: 08/13/2022 12:25     Scheduled Meds:  aspirin EC  81 mg Oral Daily   atorvastatin  80 mg Oral QPM   bisoprolol  5 mg Oral Daily   calcium carbonate  1 tablet Oral BID   dapagliflozin propanediol  10 mg Oral Daily   enoxaparin (LOVENOX) injection  40 mg Subcutaneous Q24H   fluticasone furoate-vilanterol  2 puff Inhalation Daily   And   umeclidinium bromide  1 puff Inhalation Daily   furosemide  40 mg Oral Daily   ipratropium-albuterol  3 mL Inhalation Q6H   isosorbide-hydrALAZINE  1 tablet Oral TID   loratadine  10 mg Oral q AM   methylPREDNISolone (SOLU-MEDROL) injection  40 mg Intravenous Q12H   montelukast  10 mg Oral QHS   nicotine  7 mg Transdermal Daily   potassium chloride SA  20 mEq Oral Daily   spironolactone  12.5 mg Oral Daily   Continuous Infusions:  albuterol 10 mg/hr (08/14/22 0145)   cefTRIAXone (ROCEPHIN)  IV 1 g (08/14/22 2128)     LOS: 1 day    Time spent: 52min    Domenic Polite, MD [facility]   08/15/2022, 9:58 AM

## 2022-08-15 NOTE — Plan of Care (Signed)
  Problem: Education: Goal: Knowledge of General Education information will improve Description: Including pain rating scale, medication(s)/side effects and non-pharmacologic comfort measures Outcome: Progressing   Problem: Activity: Goal: Risk for activity intolerance will decrease Outcome: Progressing   Problem: Nutrition: Goal: Adequate nutrition will be maintained Outcome: Progressing   

## 2022-08-16 LAB — BASIC METABOLIC PANEL
Anion gap: 10 (ref 5–15)
BUN: 31 mg/dL — ABNORMAL HIGH (ref 8–23)
CO2: 24 mmol/L (ref 22–32)
Calcium: 8.4 mg/dL — ABNORMAL LOW (ref 8.9–10.3)
Chloride: 99 mmol/L (ref 98–111)
Creatinine, Ser: 1.6 mg/dL — ABNORMAL HIGH (ref 0.61–1.24)
GFR, Estimated: 45 mL/min — ABNORMAL LOW (ref >60)
Glucose, Bld: 109 mg/dL — ABNORMAL HIGH (ref 70–99)
Potassium: 4.6 mmol/L (ref 3.5–5.1)
Sodium: 133 mmol/L — ABNORMAL LOW (ref 135–145)

## 2022-08-16 LAB — GLUCOSE, CAPILLARY
Glucose-Capillary: 114 mg/dL — ABNORMAL HIGH (ref 70–99)
Glucose-Capillary: 144 mg/dL — ABNORMAL HIGH (ref 70–99)
Glucose-Capillary: 182 mg/dL — ABNORMAL HIGH (ref 70–99)
Glucose-Capillary: 135 mg/dL — ABNORMAL HIGH (ref 70–99)

## 2022-08-16 MED ORDER — IPRATROPIUM-ALBUTEROL 0.5-2.5 (3) MG/3ML IN SOLN: 3.0000 mL | RESPIRATORY_TRACT | Status: DC | PRN

## 2022-08-16 NOTE — Progress Notes (Signed)
Initial Nutrition Assessment RD working remotely.  DOCUMENTATION CODES:   Not applicable  INTERVENTION:  - provide double protein portions at all meals; entered into [employer] [facility].  - entered Heart Healthy, Consistent Carbohydrate Nutrition Therapy handout in AVS.  - complete NFPE when feasible.    NUTRITION DIAGNOSIS:   Increased nutrient needs related to acute illness as evidenced by estimated needs.  GOAL:   Patient will meet greater than or equal to 90% of their needs  MONITOR:   PO intake, Weight trends, Labs, I & O's  REASON FOR ASSESSMENT:   Consult Assessment of nutrition requirement/status  ASSESSMENT:   71 year-old male with history of chronic systolic CHF, advanced COPD, ongoing tobacco abuse, type 2 DM, CKD stage 3, HTN, and dyslipidemia. He presented to the ED with a productive cough, wheezing, and shortness of breath. In the ED, he had increasing respiratory distress and required BiPAP.  Patient is now off of BiPAP.  Yesterday patient ate 100% of breakfast and lunch and 50% of dinner. Today he ate 100% of breakfast; lunch has not yet arrived. Patient denies any changes in appetite or ability to safely consume regular foods and thin liquids.   He has not been assessed by a [place] RD at any time in the past.  Weight on 1026/23 was 150 lb and weight has been stable for the past 2 months. No information documented in the edema section of flow sheet.   MD note from this AM indicates patient to d/c home, possibly in 1-2 days.   Labs reviewed; CBGs: 114 and 144 mg/dl, Na: 133 mmol/l, BUN: 31 mg/dl, creatinine: 1.6 mg/dl (trending down), GFR: 45 ml/min.  Medications reviewed; 10 mg farxiga, 40 mg oral lasix/day, 40 mg solu-medrol BID, 20 mEq Klor-Con/day, 12.5 mg aldactone/day.    NUTRITION - FOCUSED PHYSICAL EXAM:  RD working remotely.  Diet Order:   Diet Order             Diet heart healthy/carb modified Room service appropriate? Yes; Fluid  consistency: Thin  Diet effective now                   EDUCATION NEEDS:   Education needs have been addressed  Skin:  Skin Assessment: Reviewed RN Assessment  Last BM:  PTA/unknown  Height:   Ht Readings from Last 1 Encounters:  08/10/22 5\' 5"  (1.651 m)    Weight:   Wt Readings from Last 1 Encounters:  08/13/22 68 kg    BMI:  25 kg/m2 (calculated using weight on 08/13/22)  Estimated Nutritional Needs:  Kcal:  1700-2000 kcal Protein:  85-100 grams Fluid:  >/= 1.8 L/day      Jarome Matin, MS, RD, LDN, CNSC Clinical Dietitian PRN/Relief staff On-call/weekend pager # available in [facility]

## 2022-08-16 NOTE — Progress Notes (Signed)
PROGRESS NOTE    Marcus Tran  TDD:[id number] DOB: 04-Dec-1950 DOA: 08/13/2022 PCP: Cipriano Mile, NP  71/M with history of chronic systolic CHF, EF 62%, advanced COPD, ongoing tobacco abuse, type 2 diabetes mellitus, CKD 3 ,hypertension, dyslipidemia, presented to the ED with productive cough, wheezing and shortness of breath.  Denies any increased swelling, has some orthopnea which is unchanged. -Work-up in the ED noted BNP of 148, troponin 26, creatinine 1.9, chest x-ray without acute findings, COVID and respiratory virus panel was negative, had increased respiratory distress and was started on BiPAP overnight   Subjective: -Breathing is improving, back on 4 to 5 L of O2 overnight  Assessment and Plan:  Acute hypoxic respiratory failure COPD exacerbation -Off BiPAP -Remains hypoxic, continue IV Solu-Medrol, DuoNebs, ceftriaxone -Cut down Solu-Medrol dose -Flu and COVID PCR negative -Continue Breztri -Attempt to wean down O2  Chronic systolic CHF -EF 37% at baseline -Clinically appears euvolemic, discharge weight unchanged from recent CHF admission -Continue oral Lasix, Jardiance, Aldactone, Zebeta  -Creatinine been higher than baseline, holding Entresto-resume tomorrow -BiDil continued  Tobacco abuse Still smoking. -Counseled, nicotine patch  DM2 (diabetes mellitus, type 2) (HCC) Continue Farxiga -CBGs are stable, monitor on steroids, continue sliding scale insulin  AKI on CKD 3 a -Creatinine higher than baseline, will hold Entresto today -Appears euvolemic, now improving, BMP in a.m.  HTN (hypertension) -Stable, meds as above  DVT prophylaxis: Lovenox Code Status: Full code Family Communication: No family at bedside Disposition Plan: Home in 1 to 2 days  Consultants:    Procedures:   Antimicrobials:    Objective: Vitals:   08/15/22 1726 08/15/22 2106 08/16/22 0742 08/16/22 0925  BP: 118/76   120/83  Pulse: 67 70  74  Resp: 18 18  20   Temp:    98.7  F (37.1 C)  TempSrc:    Oral  SpO2: 98% 99% 92% 93%    Intake/Output Summary (Last 24 hours) at 08/16/2022 1022 Last data filed at 08/15/2022 2204 Gross per 24 hour  Intake 680 ml  Output 1700 ml  Net -1020 ml   There were no vitals filed for this visit.  Examination:  General exam: Elderly chronically ill male sitting up on the bed, AAOx3 HEENT: No JVD CVS: S1-S2, regular rhythm Lungs: Poor air movement, no expiratory wheezes today Abdomen: Soft, nontender, bowel sounds present Extremities: No edema  Skin: No rashes Psychiatry:  Mood & affect appropriate.     Data Reviewed:   CBC: Recent Labs  Lab 08/13/22 1147 08/14/22 0520 08/15/22 0535  WBC 5.1 5.6 12.6*  HGB 16.6 17.1* 14.6  HCT 50.9 52.9* 44.1  MCV 92.4 93.8 91.5  PLT 176 187 628   Basic Metabolic Panel: Recent Labs  Lab 08/13/22 1147 08/14/22 0520 08/15/22 0535 08/16/22 0335  NA 137 134* 133* 133*  K 4.0 4.4 4.2 4.6  CL 98 97* 94* 99  CO2 26 22 28 24   GLUCOSE 123* 148* 184* 109*  BUN 21 25* 34* 31*  CREATININE 1.92* 2.13* 1.84* 1.60*  CALCIUM 8.8* 8.6* 8.6* 8.4*   GFR: Estimated Creatinine Clearance: 36.3 mL/min (A) (by C-G formula based on SCr of 1.6 mg/dL (H)). Liver Function Tests: Recent Labs  Lab 08/13/22 1147 08/14/22 0520  AST 37 40  ALT 26 27  ALKPHOS 55 57  BILITOT 0.9 0.9  PROT 7.3 7.6  ALBUMIN 3.9 3.9   No results for input(s): "LIPASE", "AMYLASE" in the last 168 hours. No results for input(s): "AMMONIA" in the  last 168 hours. Coagulation Profile: No results for input(s): "INR", "PROTIME" in the last 168 hours. Cardiac Enzymes: No results for input(s): "CKTOTAL", "CKMB", "CKMBINDEX", "TROPONINI" in the last 168 hours. BNP (last 3 results) No results for input(s): "PROBNP" in the last 8760 hours. HbA1C: No results for input(s): "HGBA1C" in the last 72 hours. CBG: Recent Labs  Lab 08/14/22 0826 08/14/22 1218 08/15/22 1524 08/15/22 2106 08/16/22 0804  GLUCAP  161* 173* 209* 148* 114*   Lipid Profile: No results for input(s): "CHOL", "HDL", "LDLCALC", "TRIG", "CHOLHDL", "LDLDIRECT" in the last 72 hours. Thyroid Function Tests: No results for input(s): "TSH", "T4TOTAL", "FREET4", "T3FREE", "THYROIDAB" in the last 72 hours. Anemia Panel: No results for input(s): "VITAMINB12", "FOLATE", "FERRITIN", "TIBC", "IRON", "RETICCTPCT" in the last 72 hours. Urine analysis:    Component Value Date/Time   COLORURINE STRAW (A) 03/21/2022 1545   APPEARANCEUR CLEAR 03/21/2022 1545   LABSPEC 1.005 03/21/2022 1545   PHURINE 5.0 03/21/2022 1545   GLUCOSEU >=500 (A) 03/21/2022 1545   HGBUR NEGATIVE 03/21/2022 1545   BILIRUBINUR NEGATIVE 03/21/2022 1545   KETONESUR NEGATIVE 03/21/2022 1545   PROTEINUR NEGATIVE 03/21/2022 1545   NITRITE NEGATIVE 03/21/2022 1545   LEUKOCYTESUR NEGATIVE 03/21/2022 1545   Sepsis Labs: @LABRCNTIP (procalcitonin:4,lacticidven:4)  ) Recent Results (from the past 240 hour(s))  Resp Panel by RT-PCR (Flu A&B, Covid) Anterior Nasal Swab     Status: None   Collection Time: 08/13/22 11:27 AM   Specimen: Anterior Nasal Swab  Result Value Ref Range Status   SARS Coronavirus 2 by RT PCR NEGATIVE NEGATIVE Final    Comment: (NOTE) SARS-CoV-2 target nucleic acids are NOT DETECTED.  The SARS-CoV-2 RNA is generally detectable in upper respiratory specimens during the acute phase of infection. The lowest concentration of SARS-CoV-2 viral copies this assay can detect is 138 copies/mL. A negative result does not preclude SARS-Cov-2 infection and should not be used as the sole basis for treatment or other patient management decisions. A negative result may occur with  improper specimen collection/handling, submission of specimen other than nasopharyngeal swab, presence of viral mutation(s) within the areas targeted by this assay, and inadequate number of viral copies(<138 copies/mL). A negative result must be combined with clinical  observations, patient history, and epidemiological information. The expected result is Negative.  Fact Sheet for Patients:  08/15/22  Fact Sheet for Healthcare Providers:  BloggerCourse.com  This test is no t yet approved or cleared by the [place] FDA and  has been authorized for detection and/or diagnosis of SARS-CoV-2 by FDA under an Emergency Use Authorization (EUA). This EUA will remain  in effect (meaning this test can be used) for the duration of the COVID-19 declaration under Section 564(b)(1) of the Act, 21 U.S.C.section 360bbb-3(b)(1), unless the authorization is terminated  or revoked sooner.       Influenza A by PCR NEGATIVE NEGATIVE Final   Influenza B by PCR NEGATIVE NEGATIVE Final    Comment: (NOTE) The Xpert Xpress SARS-CoV-2/FLU/RSV plus assay is intended as an aid in the diagnosis of influenza from Nasopharyngeal swab specimens and should not be used as a sole basis for treatment. Nasal washings and aspirates are unacceptable for Xpert Xpress SARS-CoV-2/FLU/RSV testing.  Fact Sheet for Patients: [place]  Fact Sheet for Healthcare Providers: BloggerCourse.com  This test is not yet approved or cleared by the [place] FDA and has been authorized for detection and/or diagnosis of SARS-CoV-2 by FDA under an Emergency Use Authorization (EUA). This EUA will remain in effect (meaning this  test can be used) for the duration of the COVID-19 declaration under Section 564(b)(1) of the Act, 21 U.S.C. section 360bbb-3(b)(1), unless the authorization is terminated or revoked.  Performed at [facility], [address] [place], [place] [address]      Radiology Studies: No results found.   Scheduled Meds:  aspirin EC  81 mg Oral Daily   atorvastatin  80 mg Oral QPM   bisoprolol  5 mg Oral Daily   calcium carbonate  1 tablet Oral  BID   dapagliflozin propanediol  10 mg Oral Daily   enoxaparin (LOVENOX) injection  40 mg Subcutaneous Q24H   fluticasone furoate-vilanterol  2 puff Inhalation Daily   And   umeclidinium bromide  1 puff Inhalation Daily   furosemide  40 mg Oral Daily   ipratropium-albuterol  3 mL Inhalation TID   isosorbide-hydrALAZINE  1 tablet Oral TID   loratadine  10 mg Oral q AM   methylPREDNISolone (SOLU-MEDROL) injection  40 mg Intravenous Q12H   montelukast  10 mg Oral QHS   nicotine  7 mg Transdermal Daily   potassium chloride SA  20 mEq Oral Daily   spironolactone  12.5 mg Oral Daily   Continuous Infusions:  albuterol 10 mg/hr (08/14/22 0145)   cefTRIAXone (ROCEPHIN)  IV 1 g (08/15/22 2142)     LOS: 2 days    Time spent:    Zannie Cove, MD [facility]   08/16/2022, 10:22 AM

## 2022-08-16 NOTE — Progress Notes (Signed)
Occupational Therapy Treatment Patient Details Name: OBDULIO MASH MRN: [id number] DOB: 16-Sep-1951 Today's Date: 08/16/2022   History of present illness 71 yo male with onset of SOB was coughing, wheezing and had hx for several days.  Admitted 10/26, is now on O2, has hypoxia with mobility and referred to PT for follow up with mobility.  PMHx:  COPD, CHF, EF 35%, tobacco use, DM, CKD3, HTN, dyslipidemia,   OT comments  Patient standing at sink completing grooming upon entry without assistance and on RA. Patient SpO2 at 92 while on RA. Patient address simulated tub transfer with min guard assist and discussed safe transfers. Patient was provided handout for energy conservation strategies and reviewed with patient.  Patient states he currently uses some strategies and plans to get shower chair. Discharge recommendation continue to be appropriate. Acute OT to continue to follow.    Recommendations for follow up therapy are one component of a multi-disciplinary discharge planning process, led by the attending physician.  Recommendations may be updated based on patient status, additional functional criteria and insurance authorization.    Follow Up Recommendations  No OT follow up    Assistance Recommended at Discharge None  Patient can return home with the following      Equipment Recommendations  Other (comment) (Pt will purchase shower seat outside of hospital or borrow from family)    Recommendations for Other Services      Precautions / Restrictions Precautions Precautions: Fall Precaution Comments: monitor sats Restrictions Weight Bearing Restrictions: No       Mobility Bed Mobility               General bed mobility comments: OOB upon entry    Transfers Overall transfer level: Needs assistance Equipment used: None Transfers: Sit to/from Stand Sit to Stand: Modified independent (Device/Increase time)           General transfer comment: performing mobiltiy in  room without assistance upon entry     Balance Overall balance assessment: Needs assistance Sitting-balance support: Feet supported Sitting balance-Leahy Scale: Good     Standing balance support: No upper extremity supported, During functional activity Standing balance-Leahy Scale: Fair Standing balance comment: standing at sink upon entry                           ADL either performed or assessed with clinical judgement   ADL Overall ADL's : Needs assistance/impaired     Grooming: Wash/dry hands;Wash/dry face;Oral care;Modified independent Grooming Details (indicate cue type and reason): performing standing at sink upon entry                         Tub/ Shower Transfer: Nurse, learning disability Details (indicate cue type and reason): simulated in room   General ADL Comments: 92 SpO2 on room air    Extremity/Trunk Assessment Upper Extremity Assessment LUE Deficits / Details: shoulder flexion 3+/5 all other joints 4/5 throughout.  fith digit DIP contracture noted. LUE Sensation: WNL LUE Coordination: WNL            Vision       Perception     Praxis      Cognition Arousal/Alertness: Awake/alert Behavior During Therapy: WFL for tasks assessed/performed Overall Cognitive Status: Within Functional Limits for tasks assessed  Exercises      Shoulder Instructions       General Comments provided handout on energy conservation strategies and reviewed with patient    Pertinent Vitals/ Pain       Pain Assessment Pain Assessment: No/denies pain  Home Living                                          Prior Functioning/Environment              Frequency  Min 2X/week        Progress Toward Goals  OT Goals(current goals can now be found in the care plan section)  Progress towards OT goals: Progressing toward goals  Acute Rehab OT Goals Patient  Stated Goal: go home OT Goal Formulation: With patient Time For Goal Achievement: 08/28/22 Potential to Achieve Goals: Good ADL Goals Pt Will Transfer to Toilet: Independently;regular height toilet;ambulating Pt Will Perform Tub/Shower Transfer: with modified independence;ambulating;shower seat Additional ADL Goal #1: Pt will independently state at least 2 energy conservation strategies for selfcare tasks.  Plan Discharge plan remains appropriate    Co-evaluation                 AM-PAC OT "6 Clicks" Daily Activity     Outcome Measure   Help from another person eating meals?: None Help from another person taking care of personal grooming?: None Help from another person toileting, which includes using toliet, bedpan, or urinal?: A Little Help from another person bathing (including washing, rinsing, drying)?: A Little Help from another person to put on and taking off regular upper body clothing?: None Help from another person to put on and taking off regular lower body clothing?: A Little 6 Click Score: 21    End of Session    OT Visit Diagnosis: Muscle weakness (generalized) (M62.81);Unsteadiness on feet (R26.81)   Activity Tolerance Patient tolerated treatment well   Patient Left in chair;with call bell/phone within reach   Nurse Communication Mobility status        Time: [id number] OT Time Calculation (min): 28 min  Charges: OT General Charges $OT Visit: 1 Visit OT Treatments $Self Care/Home Management : 8-22 mins $Therapeutic Activity: 8-22 mins  Lodema Hong, OTA Acute Rehabilitation Services  Office [place] 08/16/2022, 1:59 PM

## 2022-08-16 NOTE — Discharge Instructions (Signed)

## 2022-08-17 LAB — BASIC METABOLIC PANEL
Anion gap: 10 (ref 5–15)
BUN: 31 mg/dL — ABNORMAL HIGH (ref 8–23)
CO2: 25 mmol/L (ref 22–32)
Calcium: 8.5 mg/dL — ABNORMAL LOW (ref 8.9–10.3)
Chloride: 95 mmol/L — ABNORMAL LOW (ref 98–111)
Creatinine, Ser: 1.65 mg/dL — ABNORMAL HIGH (ref 0.61–1.24)
GFR, Estimated: 44 mL/min — ABNORMAL LOW (ref >60)
Glucose, Bld: 192 mg/dL — ABNORMAL HIGH (ref 70–99)
Potassium: 5 mmol/L (ref 3.5–5.1)
Sodium: 130 mmol/L — ABNORMAL LOW (ref 135–145)

## 2022-08-17 LAB — GLUCOSE, CAPILLARY
Glucose-Capillary: 257 mg/dL — ABNORMAL HIGH (ref 70–99)
Glucose-Capillary: 136 mg/dL — ABNORMAL HIGH (ref 70–99)

## 2022-08-17 MED ORDER — ENTRESTO 24-26 MG PO TABS: 1.0000 | ORAL_TABLET | Freq: Two times a day (BID) | ORAL | 0 refills | Status: DC

## 2022-08-17 MED ORDER — PREDNISONE 20 MG PO TABS: 20.0000 mg | ORAL_TABLET | Freq: Every day | ORAL | 0 refills | Status: DC

## 2022-08-17 MED ORDER — CEFDINIR 300 MG PO CAPS: 300.0000 mg | ORAL_CAPSULE | Freq: Every day | ORAL | 0 refills | Status: AC

## 2022-08-17 NOTE — Discharge Summary (Signed)
Physician Discharge Summary  Marcus Tran GKK:[id number] DOB: 08/17/1951 DOA: 08/13/2022  PCP: Hillery Aldo, NP  Admit date: 08/13/2022 Discharge date: 08/17/2022  Time spent: 35 minutes  Recommendations for Outpatient Follow-up:  PCP in 1 week, please check BMP at follow-up Cardiology Dr. Antoine Poche in 1 month  Discharge Diagnoses:  Principal Problem:   COPD exacerbation (HCC) Active Problems:   Tobacco abuse   Acute respiratory failure with hypoxia (HCC)   Chronic HFrEF (heart failure with reduced ejection fraction) (HCC)   HTN (hypertension)   Stage 3a chronic kidney disease (CKD) (HCC)   DM2 (diabetes mellitus, type 2) (HCC)   Discharge Condition: Improved  Diet recommendation: Low-sodium, diabetic  There were no vitals filed for this visit.  History of present illness:  71/M with history of chronic systolic CHF, EF 51%, advanced COPD, ongoing tobacco abuse, type 2 diabetes mellitus, CKD 3 ,hypertension, dyslipidemia, presented to the ED with productive cough, wheezing and shortness of breath.  Denies any increased swelling, has some orthopnea which is unchanged. -Work-up in the ED noted BNP of 148, troponin 26, creatinine 1.9, chest x-ray without acute findings, COVID and respiratory virus panel was negative, had increased respiratory distress and was started on BiPAP overnight    Hospital Course:   Acute hypoxic respiratory failure COPD exacerbation -Off BiPAP -Remains hypoxic, treated with IV Solu-Medrol, DuoNebs, ceftriaxone -Flu and COVID PCR negative -Transitioned to prednisone taper, oral antibiotics continue Breztri -Much improved, weaned off O2 at the time of discharge   Chronic systolic CHF -EF 83% at baseline -Clinically appears euvolemic, discharge weight unchanged from recent CHF admission -Continue oral Lasix, Jardiance, Aldactone, Zebeta  -Creatinine been higher than baseline, Entresto dose decreased -BiDil continued   Tobacco abuse Still  smoking. -Counseled, nicotine patch   DM2 (diabetes mellitus, type 2) (HCC) Continue Farxiga   AKI on CKD 3 a -Creatinine higher than baseline, Entresto held yesterday, resumed at lower dose at discharge   HTN (hypertension) -Stable, meds as above   Discharge Exam: Vitals:   08/17/22 0626 08/17/22 0847  BP: (!) 135/92 (!) 142/89  Pulse:  78  Resp: 18 17  Temp: 98 F (36.7 C) 97.9 F (36.6 C)  SpO2:  100%   General exam: Elderly chronically ill male sitting up on the bed, AAOx3 HEENT: No JVD CVS: S1-S2, regular rhythm Lungs: Poor air movement, no expiratory wheezes today Abdomen: Soft, nontender, bowel sounds present Extremities: No edema  Skin: No rashes Psychiatry:  Mood & affect appropriate.   Discharge Instructions   Discharge Instructions     Diet - low sodium heart healthy   Complete by: As directed    Increase activity slowly   Complete by: As directed       Allergies as of 08/17/2022   No Known Allergies      Medication List     STOP taking these medications    Entresto 97-103 MG Generic drug: sacubitril-valsartan Replaced by: Sherryll Burger 24-26 MG       TAKE these medications    albuterol 108 (90 Base) MCG/ACT inhaler Commonly known as: VENTOLIN HFA Inhale 2 puffs into the lungs every 4 (four) hours as needed for wheezing or shortness of breath.   Allergy Relief 10 MG tablet Generic drug: loratadine Take 10 mg by mouth in the morning.   aspirin EC 81 MG tablet Take 1 tablet (81 mg total) by mouth daily. Swallow whole.   atorvastatin 80 MG tablet Commonly known as: LIPITOR Take 1 tablet (80 mg  total) by mouth every evening.   bisoprolol 5 MG tablet Commonly known as: ZEBETA Take 1 tablet (5 mg total) by mouth daily.   Breztri Aerosphere 160-9-4.8 MCG/ACT Aero Generic drug: Budeson-Glycopyrrol-Formoterol Inhale 2 puffs into the lungs as needed.   cefdinir 300 MG capsule Commonly known as: OMNICEF Take 1 capsule (300 mg total)  by mouth daily for 3 days.   dapagliflozin propanediol 10 MG Tabs tablet Commonly known as: FARXIGA Take 1 tablet (10 mg total) by mouth daily.   Entresto 24-26 MG Generic drug: sacubitril-valsartan Take 1 tablet by mouth 2 (two) times daily. Replaces: Entresto 97-103 MG   furosemide 40 MG tablet Commonly known as: LASIX Take 1 tablet (40 mg total) by mouth 2 (two) times daily.   ipratropium-albuterol 0.5-2.5 (3) MG/3ML Soln Commonly known as: DUONEB Inhale 3 mLs into the lungs in the morning, at noon, in the evening, and at bedtime.   isosorbide-hydrALAZINE 20-37.5 MG tablet Commonly known as: BiDil Take 1 tablet by mouth 3 (three) times daily.   metFORMIN 500 MG tablet Commonly known as: GLUCOPHAGE Take 1 tablet (500 mg total) by mouth 2 (two) times daily with a meal.   montelukast 10 MG tablet Commonly known as: SINGULAIR Take 10 mg by mouth at bedtime.   nitroGLYCERIN 0.4 MG SL tablet Commonly known as: NITROSTAT Place 1 tablet (0.4 mg total) under the tongue every 5 (five) minutes as needed for chest pain.   potassium chloride SA 20 MEQ tablet Commonly known as: KLOR-CON M Take 1 tablet (20 mEq total) by mouth daily.   predniSONE 20 MG tablet Commonly known as: DELTASONE Take 1-2 tablets (20-40 mg total) by mouth daily. Take 40mg  daily for 2days then 20mg  daily for 2days then STOP   spironolactone 25 MG tablet Commonly known as: ALDACTONE Take 0.5 tablets (12.5 mg total) by mouth daily.   Symbicort 160-4.5 MCG/ACT inhaler Generic drug: budesonide-formoterol Inhale 2 puffs into the lungs 2 (two) times daily.       No Known Allergies  Follow-up Information     , NP. Schedule an appointment as soon as possible for a visit in 1 week(s).   Contact information: [address] [place] [address] [place] [phone]                  The results of significant diagnostics from this hospitalization (including imaging, microbiology,  ancillary and laboratory) are listed below for reference.    Significant Diagnostic Studies: DG CHEST PORT 1 VIEW  Result Date: 08/14/2022 CLINICAL DATA:  Respiratory distress, shortness of breath. EXAM: PORTABLE CHEST 1 VIEW COMPARISON:  08/13/2022. FINDINGS: The heart is normal in size. There is stable fullness at the right hilum, similar in appearance to the prior exam. No consolidation, effusion, or pneumothorax. No acute osseous abnormality. IMPRESSION: No active disease. Electronically Signed   By: 08/16/2022 M.D.   On: 08/14/2022 03:40   DG Chest 2 View  Result Date: 08/13/2022 CLINICAL DATA:  Shortness of breath for 2 days. EXAM: CHEST - 2 VIEW COMPARISON:  Chest radiograph from March 20, 2022 in May 05, 2022. FINDINGS: Stable top-normal heart size and fullness of bilateral hilar structures potentially related to pulmonary vascular congestion. No lobar consolidation. No pneumothorax. No sign of pleural effusion. On limited assessment there is no acute skeletal process. IMPRESSION: Stable top-normal heart size and fullness of bilateral hilar structures potentially related to pulmonary vascular congestion. Radiographic appearance is stable compared to imaging from March 20, 2022. Electronically Signed  By: Zetta Bills M.D.   On: 08/13/2022 12:25    Microbiology: Recent Results (from the past 240 hour(s))  Resp Panel by RT-PCR (Flu A&B, Covid) Anterior Nasal Swab     Status: None   Collection Time: 08/13/22 11:27 AM   Specimen: Anterior Nasal Swab  Result Value Ref Range Status   SARS Coronavirus 2 by RT PCR NEGATIVE NEGATIVE Final    Comment: (NOTE) SARS-CoV-2 target nucleic acids are NOT DETECTED.  The SARS-CoV-2 RNA is generally detectable in upper respiratory specimens during the acute phase of infection. The lowest concentration of SARS-CoV-2 viral copies this assay can detect is 138 copies/mL. A negative result does not preclude SARS-Cov-2 infection and should not be used as  the sole basis for treatment or other patient management decisions. A negative result may occur with  improper specimen collection/handling, submission of specimen other than nasopharyngeal swab, presence of viral mutation(s) within the areas targeted by this assay, and inadequate number of viral copies(<138 copies/mL). A negative result must be combined with clinical observations, patient history, and epidemiological information. The expected result is Negative.  Fact Sheet for Patients:  EntrepreneurPulse.com.au  Fact Sheet for Healthcare Providers:  IncredibleEmployment.be  This test is no t yet approved or cleared by the [place] FDA and  has been authorized for detection and/or diagnosis of SARS-CoV-2 by FDA under an Emergency Use Authorization (EUA). This EUA will remain  in effect (meaning this test can be used) for the duration of the COVID-19 declaration under Section 564(b)(1) of the Act, 21 U.S.C.section 360bbb-3(b)(1), unless the authorization is terminated  or revoked sooner.       Influenza A by PCR NEGATIVE NEGATIVE Final   Influenza B by PCR NEGATIVE NEGATIVE Final    Comment: (NOTE) The Xpert Xpress SARS-CoV-2/FLU/RSV plus assay is intended as an aid in the diagnosis of influenza from Nasopharyngeal swab specimens and should not be used as a sole basis for treatment. Nasal washings and aspirates are unacceptable for Xpert Xpress SARS-CoV-2/FLU/RSV testing.  Fact Sheet for Patients: EntrepreneurPulse.com.au  Fact Sheet for Healthcare Providers: IncredibleEmployment.be  This test is not yet approved or cleared by the [place] FDA and has been authorized for detection and/or diagnosis of SARS-CoV-2 by FDA under an Emergency Use Authorization (EUA). This EUA will remain in effect (meaning this test can be used) for the duration of the COVID-19 declaration under Section 564(b)(1) of  the Act, 21 U.S.C. section 360bbb-3(b)(1), unless the authorization is terminated or revoked.  Performed at [facility], [place] [address] [place], [place] [address]      Labs: Basic Metabolic Panel: Recent Labs  Lab 08/13/22 1147 08/14/22 0520 08/15/22 0535 08/16/22 0335 08/17/22 0340  NA 137 134* 133* 133* 130*  K 4.0 4.4 4.2 4.6 5.0  CL 98 97* 94* 99 95*  CO2 26 22 28 24 25   GLUCOSE 123* 148* 184* 109* 192*  BUN 21 25* 34* 31* 31*  CREATININE 1.92* 2.13* 1.84* 1.60* 1.65*  CALCIUM 8.8* 8.6* 8.6* 8.4* 8.5*   Liver Function Tests: Recent Labs  Lab 08/13/22 1147 08/14/22 0520  AST 37 40  ALT 26 27  ALKPHOS 55 57  BILITOT 0.9 0.9  PROT 7.3 7.6  ALBUMIN 3.9 3.9   No results for input(s): "LIPASE", "AMYLASE" in the last 168 hours. No results for input(s): "AMMONIA" in the last 168 hours. CBC: Recent Labs  Lab 08/13/22 1147 08/14/22 0520 08/15/22 0535  WBC 5.1 5.6 12.6*  HGB 16.6  17.1* 14.6  HCT 50.9 52.9* 44.1  MCV 92.4 93.8 91.5  PLT 176 187 172   Cardiac Enzymes: No results for input(s): "CKTOTAL", "CKMB", "CKMBINDEX", "TROPONINI" in the last 168 hours. BNP: BNP (last 3 results) Recent Labs    05/19/22 1258 06/18/22 1604 08/13/22 1147  BNP 170.8* 189.5* 148.6*    ProBNP (last 3 results) No results for input(s): "PROBNP" in the last 8760 hours.  CBG: Recent Labs  Lab 08/16/22 0804 08/16/22 1148 08/16/22 1802 08/16/22 2249 08/17/22 0853  GLUCAP 114* 144* 182* 135* 257*       Signed:  Zannie Cove MD.  [facility] 08/17/2022, 11:13 AM

## 2022-08-17 NOTE — Progress Notes (Signed)
Physical Therapy Treatment Patient Details Name: Marcus Tran MRN: [id number] DOB: 1950-12-27 Today's Date: 08/17/2022   History of Present Illness 71 yo male with onset of SOB was coughing, wheezing and had hx for several days.  Admitted 10/26, is now on O2, has hypoxia with mobility and referred to PT for follow up with mobility.  PMHx:  COPD, CHF, EF 35%, tobacco use, DM, CKD3, HTN, dyslipidemia,    PT Comments    Pt was seen for last PT visit and has been discharged by MD now.  Had controlled sats on room air to walk 120', with HR 71 pre and 74 post, sats 96% pre and 94% post gait.  Pt is comfortable with the effort, very aware of managing his pace for personal ability to push aerobically.  Pt will continue to need to work on LE strengthening as he is aware of RLE esp being chronically weak.  Follow acutely for goals of PT.   Recommendations for follow up therapy are one component of a multi-disciplinary discharge planning process, led by the attending physician.  Recommendations may be updated based on patient status, additional functional criteria and insurance authorization.  Follow Up Recommendations  Home health PT     Assistance Recommended at Discharge Intermittent Supervision/Assistance  Patient can return home with the following A little help with walking and/or transfers;Assistance with cooking/housework;Assist for transportation;Help with stairs or ramp for entrance   Equipment Recommendations  Rollator (4 wheels)    Recommendations for Other Services       Precautions / Restrictions Precautions Precautions: Fall Precaution Comments: monitor sats Restrictions Weight Bearing Restrictions: No     Mobility  Bed Mobility               General bed mobility comments: OOB upon entry    Transfers Overall transfer level: Needs assistance Equipment used: None Transfers: Sit to/from Stand Sit to Stand: Modified independent (Device/Increase time)                 Ambulation/Gait Ambulation/Gait assistance: Min guard Gait Distance (Feet): 120 Feet Assistive device: Rolling walker (2 wheels) Gait Pattern/deviations: Step-through pattern, Decreased stride length Gait velocity: reduced Gait velocity interpretation: <1.31 ft/sec, indicative of household ambulator Pre-gait activities: monitored sats initially along with pulse General Gait Details: hallway walk due to pt being up and rested   Stairs             Wheelchair Mobility    Modified Rankin (Stroke Patients Only)       Balance Overall balance assessment: Needs assistance Sitting-balance support: Feet supported Sitting balance-Leahy Scale: Good     Standing balance support: Bilateral upper extremity supported Standing balance-Leahy Scale: Fair                              Cognition Arousal/Alertness: Awake/alert Behavior During Therapy: WFL for tasks assessed/performed Overall Cognitive Status: Within Functional Limits for tasks assessed                                          Exercises General Exercises - Lower Extremity Ankle Circles/Pumps: AROM, 5 reps Quad Sets: AROM, 10 reps Gluteal Sets: AROM, 10 reps Hip ABduction/ADduction: AROM, 10 reps    General Comments General comments (skin integrity, edema, etc.): Pt was controlled for sats and HR during walk, no changes essentially  with walk      Pertinent Vitals/Pain Pain Assessment Pain Assessment: No/denies pain    Home Living                          Prior Function            PT Goals (current goals can now be found in the care plan section) Acute Rehab PT Goals Patient Stated Goal: to get home and feel better Progress towards PT goals: Progressing toward goals    Frequency    Min 3X/week      PT Plan Current plan remains appropriate    Co-evaluation              AM-PAC PT "6 Clicks" Mobility   Outcome Measure  Help needed turning from  your back to your side while in a flat bed without using bedrails?: A Little Help needed moving from lying on your back to sitting on the side of a flat bed without using bedrails?: A Little Help needed moving to and from a bed to a chair (including a wheelchair)?: A Little Help needed standing up from a chair using your arms (e.g., wheelchair or bedside chair)?: A Little Help needed to walk in hospital room?: A Little Help needed climbing 3-5 steps with a railing? : A Little 6 Click Score: 18    End of Session Equipment Utilized During Treatment: Gait belt (on room air) Activity Tolerance: Patient tolerated treatment well Patient left: in chair;with call bell/phone within reach;with chair alarm set;with nursing/sitter in room Nurse Communication: Mobility status PT Visit Diagnosis: Unsteadiness on feet (R26.81);Difficulty in walking, not elsewhere classified (R26.2)     Time: 1014-1030 PT Time Calculation (min) (ACUTE ONLY): 16 min  Charges:  $Gait Training: 8-22 mins Ivar Drape 08/17/2022, 12:04 PM  Samul Dada, PT PhD Acute Rehab Dept. Number: [facility] [id number] and [facility] [phone]

## 2022-08-17 NOTE — Progress Notes (Signed)
Nsg Discharge Note  Admit Date:  08/13/2022 Discharge date: 08/17/2022   LIO WEHRLY to be D/C'd Home per MD order.  AVS completed. Patient/caregiver able to verbalize understanding.  Discharge Medication: Allergies as of 08/17/2022   No Known Allergies      Medication List     STOP taking these medications    Entresto 97-103 MG Generic drug: sacubitril-valsartan Replaced by: Sherryll Burger 24-26 MG       TAKE these medications    albuterol 108 (90 Base) MCG/ACT inhaler Commonly known as: VENTOLIN HFA Inhale 2 puffs into the lungs every 4 (four) hours as needed for wheezing or shortness of breath.   Allergy Relief 10 MG tablet Generic drug: loratadine Take 10 mg by mouth in the morning.   aspirin EC 81 MG tablet Take 1 tablet (81 mg total) by mouth daily. Swallow whole.   atorvastatin 80 MG tablet Commonly known as: LIPITOR Take 1 tablet (80 mg total) by mouth every evening.   bisoprolol 5 MG tablet Commonly known as: ZEBETA Take 1 tablet (5 mg total) by mouth daily.   Breztri Aerosphere 160-9-4.8 MCG/ACT Aero Generic drug: Budeson-Glycopyrrol-Formoterol Inhale 2 puffs into the lungs as needed.   cefdinir 300 MG capsule Commonly known as: OMNICEF Take 1 capsule (300 mg total) by mouth daily for 3 days.   dapagliflozin propanediol 10 MG Tabs tablet Commonly known as: FARXIGA Take 1 tablet (10 mg total) by mouth daily.   Entresto 24-26 MG Generic drug: sacubitril-valsartan Take 1 tablet by mouth 2 (two) times daily. Replaces: Entresto 97-103 MG   furosemide 40 MG tablet Commonly known as: LASIX Take 1 tablet (40 mg total) by mouth 2 (two) times daily.   ipratropium-albuterol 0.5-2.5 (3) MG/3ML Soln Commonly known as: DUONEB Inhale 3 mLs into the lungs in the morning, at noon, in the evening, and at bedtime.   isosorbide-hydrALAZINE 20-37.5 MG tablet Commonly known as: BiDil Take 1 tablet by mouth 3 (three) times daily.   metFORMIN 500 MG  tablet Commonly known as: GLUCOPHAGE Take 1 tablet (500 mg total) by mouth 2 (two) times daily with a meal.   montelukast 10 MG tablet Commonly known as: SINGULAIR Take 10 mg by mouth at bedtime.   nitroGLYCERIN 0.4 MG SL tablet Commonly known as: NITROSTAT Place 1 tablet (0.4 mg total) under the tongue every 5 (five) minutes as needed for chest pain.   potassium chloride SA 20 MEQ tablet Commonly known as: KLOR-CON M Take 1 tablet (20 mEq total) by mouth daily.   predniSONE 20 MG tablet Commonly known as: DELTASONE Take 1-2 tablets (20-40 mg total) by mouth daily. Take 40mg  daily for 2days then 20mg  daily for 2days then STOP   spironolactone 25 MG tablet Commonly known as: ALDACTONE Take 0.5 tablets (12.5 mg total) by mouth daily.   Symbicort 160-4.5 MCG/ACT inhaler Generic drug: budesonide-formoterol Inhale 2 puffs into the lungs 2 (two) times daily.        Discharge Assessment: Vitals:   08/17/22 0626 08/17/22 0847  BP: (!) 135/92 (!) 142/89  Pulse:  78  Resp: 18 17  Temp: 98 F (36.7 C) 97.9 F (36.6 C)  SpO2:  100%   Skin clean, dry and intact without evidence of skin break down, no evidence of skin tears noted. IV catheter discontinued intact. Site without signs and symptoms of complications - no redness or edema noted at insertion site, patient denies c/o pain - only slight tenderness at site.  Dressing with slight pressure  applied.  D/c Instructions-Education: Discharge instructions given to patient/family with verbalized understanding. D/c education completed with patient/family including follow up instructions, medication list, d/c activities limitations if indicated, with other d/c instructions as indicated by MD - patient able to verbalize understanding, all questions fully answered. Patient instructed to return to ED, call 911, or call MD for any changes in condition.  Patient escorted via [place], and D/C home via private auto.  Atilano Ina, RN 08/17/2022  11:59 AM

## 2022-08-17 NOTE — Inpatient Diabetes Management (Signed)
Inpatient Diabetes Program Recommendations  AACE/ADA: New Consensus Statement on Inpatient Glycemic Control (2015)  Target Ranges:  Prepandial:   less than 140 mg/dL      Peak postprandial:   less than 180 mg/dL (1-2 hours)      Critically ill patients:  140 - 180 mg/dL   Lab Results  Component Value Date   GLUCAP 257 (H) 08/17/2022   HGBA1C 6.9 (H) 03/07/2022    Review of Glycemic Control  Latest Reference Range & Units 08/14/22 08:26 08/14/22 12:18 08/15/22 15:24 08/15/22 21:06 08/16/22 08:04 08/16/22 11:48 08/16/22 18:02 08/16/22 22:49 08/17/22 08:53  Glucose-Capillary 70 - 99 mg/dL 161 (H) 173 (H) 209 (H) 148 (H) 114 (H) 144 (H) 182 (H) 135 (H) 257 (H)   Diabetes history: DM 2 Outpatient Diabetes medications: Farxiga 10 mg Daily, Metformin 500 mg bid Current orders for Inpatient glycemic control:  Farxiga 10 mg Daily  Solumedrol 40 mg Q12 hours  Inpatient Diabetes Program Recommendations:    Glucose 257 this am.  -  May consider Novolog 0-6 units tid  Thanks,  Tama Headings RN, MSN, BC-ADM Inpatient Diabetes Coordinator Team Pager [phone] (8a-5p)

## 2022-08-17 NOTE — Care Management Important Message (Signed)
Important Message  Patient Details  Name: Marcus Tran MRN: [id number] Date of Birth: October 02, 1951   Medicare Important Message Given:  Yes     Hannah Beat 08/17/2022, 11:58 AM

## 2022-08-19 ENCOUNTER — Other Ambulatory Visit (HOSPITAL_COMMUNITY): Payer: Self-pay | Admitting: Emergency Medicine

## 2022-08-19 ENCOUNTER — Telehealth (HOSPITAL_COMMUNITY): Payer: Self-pay | Admitting: Emergency Medicine

## 2022-08-19 NOTE — Progress Notes (Signed)
Home visit @ 13:44 to review prescriptions with Marcus Tran.  He correctly reconciled his new dose of Entresto 24-26.  Assisted him with adding his prednisone dosing and Omnicef to his med box without incident..  Visit complete.      Renee Ramus, [place] 08/19/2022

## 2022-08-19 NOTE — Telephone Encounter (Signed)
[name] called to advise that his prescriptions been delivered.  He has literacy issues and was not clear as to how to take the meds.  I advised him I would come by and assist him with same.    Renee Ramus, [place] 08/19/2022

## 2022-08-19 NOTE — Progress Notes (Signed)
Paramedicine Encounter    Patient ID: Marcus Tran, male    DOB: 15-Jan-1951, 71 y.o.   MRN: [id number]   BP 110/70 (BP Location: Right Arm, Patient Position: Sitting, Cuff Size: Normal)   Pulse 64   Resp 16   SpO2 96%  Weight yesterday-not taken Last visit weight-149lb  Home visit with Marcus Tran today following his hospitalization for COPD exacerbation.  He denies chest pain or SOB.  Lung sounds still with ronchi throughout.  Taking a deep breath stimulates him to cough quite a lot.  He has no wheezing.  He has no edema to his lower extremities.  Med changes = decreased Entresto from 97-103 mg to 24-26 mg.   I had called pharmacy yesterday to confirm new prescriptions had been called in and they advise they were out for delivery.  Today, however; Marcus Tran says they had not been delivered.  I again contacted Pharmacy who advised that the prescriptions had not been delivered yesterday because no one was home and they advised they would be delivered some time later today.  Med box reconciled.  Entresto 97-103 removed from med box. Advised Marcus Tran to reach out to me when his prescriptions are delivered if he has any questions.  Home visit complete.    Renee Ramus, [place] 08/19/2022   Patient Care Team: Cipriano Mile, NP as PCP - Cheral Bay, MD as PCP - Cardiology (Cardiology)  Patient Active Problem List   Diagnosis Date Noted   COPD exacerbation ([place]) 08/13/2022   Chronic HFrEF (heart failure with reduced ejection fraction) ([place]) 08/13/2022   DM2 (diabetes mellitus, type 2) ([place]) 08/13/2022   Obstructive sleep apnea (adult) (pediatric) 08/08/2022   Acute respiratory failure with hypoxia ([place]) 05/04/2022   Third degree heart block ([place]) 04/15/2022   Snoring 04/15/2022   Nonischemic cardiomyopathy ([place]) 04/15/2022   HFrEF (heart failure with reduced ejection fraction) ([place]) 04/15/2022   COPD with acute exacerbation ([place]) 03/22/2022   Acute on chronic  combined systolic and diastolic CHF (congestive heart failure) ([place])    Tobacco abuse 03/08/2022   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   HTN (hypertension) 03/08/2022   Stage 3a chronic kidney disease (CKD) ([place]) 03/08/2022   Coronary artery disease 03/08/2022    Current Outpatient Medications:    albuterol (VENTOLIN HFA) 108 (90 Base) MCG/ACT inhaler, Inhale 2 puffs into the lungs every 4 (four) hours as needed for wheezing or shortness of breath., Disp: 1 each, Rfl: 11   aspirin EC 81 MG tablet, Take 1 tablet (81 mg total) by mouth daily. Swallow whole., Disp: 90 tablet, Rfl: 3   atorvastatin (LIPITOR) 80 MG tablet, Take 1 tablet (80 mg total) by mouth every evening., Disp: 90 tablet, Rfl: 3   bisoprolol (ZEBETA) 5 MG tablet, Take 1 tablet (5 mg total) by mouth daily., Disp: 30 tablet, Rfl: 3   Budeson-Glycopyrrol-Formoterol (BREZTRI AEROSPHERE) 160-9-4.8 MCG/ACT AERO, Inhale 2 puffs into the lungs as needed., Disp: 10.6 g, Rfl: 11   cefdinir (OMNICEF) 300 MG capsule, Take 1 capsule (300 mg total) by mouth daily for 3 days., Disp: 3 capsule, Rfl: 0   dapagliflozin propanediol (FARXIGA) 10 MG TABS tablet, Take 1 tablet (10 mg total) by mouth daily., Disp: 30 tablet, Rfl: 5   furosemide (LASIX) 40 MG tablet, Take 1 tablet (40 mg total) by mouth 2 (two) times daily., Disp: 90 tablet, Rfl: 3   ipratropium-albuterol (DUONEB) 0.5-2.5 (3) MG/3ML SOLN, Inhale 3 mLs into the lungs in the  morning, at noon, in the evening, and at bedtime., Disp: 360 mL, Rfl: 11   isosorbide-hydrALAZINE (BIDIL) 20-37.5 MG tablet, Take 1 tablet by mouth 3 (three) times daily., Disp: 90 tablet, Rfl: 3   loratadine (ALLERGY RELIEF) 10 MG tablet, Take 10 mg by mouth in the morning., Disp: , Rfl:    metFORMIN (GLUCOPHAGE) 500 MG tablet, Take 1 tablet (500 mg total) by mouth 2 (two) times daily with a meal., Disp: 60 tablet, Rfl: 0   montelukast (SINGULAIR) 10 MG tablet, Take 10 mg by mouth at bedtime., Disp: , Rfl:     nitroGLYCERIN (NITROSTAT) 0.4 MG SL tablet, Place 1 tablet (0.4 mg total) under the tongue every 5 (five) minutes as needed for chest pain., Disp: 25 tablet, Rfl: 12   potassium chloride SA (KLOR-CON M) 20 MEQ tablet, Take 1 tablet (20 mEq total) by mouth daily., Disp: 180 tablet, Rfl: 3   predniSONE (DELTASONE) 20 MG tablet, Take 1-2 tablets (20-40 mg total) by mouth daily. Take 40mg  daily for 2days then 20mg  daily for 2days then STOP, Disp: 6 tablet, Rfl: 0   sacubitril-valsartan (ENTRESTO) 24-26 MG, Take 1 tablet by mouth 2 (two) times daily., Disp: 60 tablet, Rfl: 0   spironolactone (ALDACTONE) 25 MG tablet, Take 0.5 tablets (12.5 mg total) by mouth daily., Disp: 15 tablet, Rfl: 3   SYMBICORT 160-4.5 MCG/ACT inhaler, Inhale 2 puffs into the lungs 2 (two) times daily., Disp: , Rfl:  No Known Allergies    Social History   Socioeconomic History   Marital status: Single    Spouse name: Not on file   Number of children: 1   Years of education: Not on file   Highest education level: 10th grade  Occupational History   Occupation: Disability  Tobacco Use   Smoking status: Former    Packs/day: 0.50    Years: 50.00    Total pack years: 25.00    Types: Cigarettes    Quit date: 03/19/2022    Years since quitting: 0.4   Smokeless tobacco: Never   Tobacco comments:    Smoking cessation  Vaping Use   Vaping Use: Never used  Substance and Sexual Activity   Alcohol use: Not on file    Comment: socially   Drug use: Yes    Types: Marijuana    Comment: past   Sexual activity: Not on file  Other Topics Concern   Not on file  Social History Narrative   He lives with his daughter apparently and 5 grandchildren.  He reports that he smokes probably less than a pack of cigarettes a day.  He occasionally drinks a beer or liquor but not daily.   Social Determinants of Health   Financial Resource Strain: Low Risk  (03/11/2022)   Overall Financial Resource Strain (CARDIA)    Difficulty of  Paying Living Expenses: Not very hard  Food Insecurity: No Food Insecurity (08/14/2022)   Hunger Vital Sign    Worried About Running Out of Food in the Last Year: Never true    Ran Out of Food in the Last Year: Never true  Transportation Needs: No Transportation Needs (08/14/2022)   PRAPARE - 08/16/2022 (Medical): No    Lack of Transportation (Non-Medical): No  Physical Activity: Not on file  Stress: Not on file  Social Connections: Not on file  Intimate Partner Violence: Not At Risk (08/14/2022)   Humiliation, Afraid, Rape, and Kick questionnaire    Fear of Current or  Ex-Partner: No    Emotionally Abused: No    Physically Abused: No    Sexually Abused: No    Physical Exam      Future Appointments  Date Time Provider Department Center  12/31/2022 11:30 AM Marinus Maw, MD [facility]       Beatrix Shipper, EMT-Paramedic [phone] [facility] Paramedic  08/19/22

## 2022-08-26 ENCOUNTER — Other Ambulatory Visit (HOSPITAL_COMMUNITY): Payer: Self-pay | Admitting: Emergency Medicine

## 2022-08-26 ENCOUNTER — Other Ambulatory Visit (HOSPITAL_BASED_OUTPATIENT_CLINIC_OR_DEPARTMENT_OTHER): Payer: Self-pay | Admitting: Family

## 2022-08-26 ENCOUNTER — Other Ambulatory Visit (HOSPITAL_COMMUNITY): Payer: Self-pay | Admitting: Cardiology

## 2022-08-26 DIAGNOSIS — I5020 Unspecified systolic (congestive) heart failure: Secondary | ICD-10-CM

## 2022-08-26 NOTE — Progress Notes (Signed)
Paramedicine Encounter    Patient ID: Marcus Tran, male    DOB: [birth year], 71 y.o.   MRN: [id number]   BP 118/80 (BP Location: Left Arm, Patient Position: Standing, Cuff Size: Normal)   Pulse 68   Resp 16   Wt 150 lb 3.2 oz (68.1 kg)   SpO2 99%   BMI 24.99 kg/m  Weight yesterday-not taken Last visit weight-not taken  ATF Mr. Fuston A&O x 4, skin W&D w/ good color.  Mr Orvan July was sitting on outside on his front porch.  We were able to do the home visit outside due to great weather.  Mr. Caponi has done well taking all his medications.  Med box reconciled x 1 week.  Refills for Bisoprolol and Loratadine [facility] and they will be delivered tomorrow.  Pt. Denies chest pain or SOB.  Lung sounds clear and equal bilat.  No edema noted.  Mr. Hoecker has an upcoming appointment 11/13 @ 1:00 at [facility].  Home visit complete.    Beatrix Shipper, EMT-Paramedic [phone] 08/26/2022   Patient Care Team: Hillery Aldo, NP as PCP - Reatha Armour, MD as PCP - Cardiology (Cardiology)  Patient Active Problem List   Diagnosis Date Noted   COPD exacerbation ([facility]) 08/13/2022   Chronic HFrEF (heart failure with reduced ejection fraction) ([facility]) 08/13/2022   DM2 (diabetes mellitus, type 2) ([facility]) 08/13/2022   Obstructive sleep apnea (adult) (pediatric) 08/08/2022   Acute respiratory failure with hypoxia (HCC) 05/04/2022   Third degree heart block (H[facility]) 04/15/2022   Snoring 04/15/2022   Nonischemic cardiomyopathy (H[facility]) 04/15/2022   HFrEF (heart failure with reduced ejection fraction) (HCC) 04/15/2022   COPD with acute exacerbation (H[facility]) 03/22/2022   Acute on chronic combined systolic and diastolic CHF (congestive heart failure) (HCC)    Tobacco abuse 03/08/2022   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   HTN (hypertension) 03/08/2022   Stage 3a chronic kidney disease (CKD) ([facility]) 03/08/2022   Coronary artery disease 03/08/2022    Current Outpatient Medications:     albuterol (VENTOLIN HFA) 108 (90 Base) MCG/ACT inhaler, Inhale 2 puffs into the lungs every 4 (four) hours as needed for wheezing or shortness of breath., Disp: 1 each, Rfl: 11   aspirin EC 81 MG tablet, Take 1 tablet (81 mg total) by mouth daily. Swallow whole., Disp: 90 tablet, Rfl: 3   atorvastatin (LIPITOR) 80 MG tablet, Take 1 tablet (80 mg total) by mouth every evening., Disp: 90 tablet, Rfl: 3   bisoprolol (ZEBETA) 5 MG tablet, Take 1 tablet (5 mg total) by mouth daily., Disp: 30 tablet, Rfl: 3   Budeson-Glycopyrrol-Formoterol (BREZTRI AEROSPHERE) 160-9-4.8 MCG/ACT AERO, Inhale 2 puffs into the lungs as needed., Disp: 10.6 g, Rfl: 11   furosemide (LASIX) 40 MG tablet, Take 1 tablet (40 mg total) by mouth 2 (two) times daily., Disp: 90 tablet, Rfl: 3   ipratropium-albuterol (DUONEB) 0.5-2.5 (3) MG/3ML SOLN, Inhale 3 mLs into the lungs in the morning, at noon, in the evening, and at bedtime., Disp: 360 mL, Rfl: 11   isosorbide-hydrALAZINE (BIDIL) 20-37.5 MG tablet, Take 1 tablet by mouth 3 (three) times daily., Disp: 90 tablet, Rfl: 3   loratadine (ALLERGY RELIEF) 10 MG tablet, Take 10 mg by mouth in the morning., Disp: , Rfl:    montelukast (SINGULAIR) 10 MG tablet, Take 10 mg by mouth at bedtime., Disp: , Rfl:    nitroGLYCERIN (NITROSTAT) 0.4 MG SL tablet, Place 1 tablet (0.4 mg total) under the  tongue every 5 (five) minutes as needed for chest pain., Disp: 25 tablet, Rfl: 12   potassium chloride SA (KLOR-CON M) 20 MEQ tablet, Take 1 tablet (20 mEq total) by mouth daily., Disp: 180 tablet, Rfl: 3   sacubitril-valsartan (ENTRESTO) 24-26 MG, Take 1 tablet by mouth 2 (two) times daily., Disp: 60 tablet, Rfl: 0   spironolactone (ALDACTONE) 25 MG tablet, Take 0.5 tablets (12.5 mg total) by mouth daily., Disp: 15 tablet, Rfl: 3   FARXIGA 10 MG TABS tablet, TAKE 1 Tablet BY MOUTH ONCE DAILY, Disp: 30 tablet, Rfl: 5   metFORMIN (GLUCOPHAGE) 500 MG tablet, Take 1 tablet (500 mg total) by mouth 2  (two) times daily with a meal. (Patient not taking: Reported on 08/19/2022), Disp: 60 tablet, Rfl: 0   predniSONE (DELTASONE) 20 MG tablet, Take 1-2 tablets (20-40 mg total) by mouth daily. Take 40mg  daily for 2days then 20mg  daily for 2days then STOP (Patient not taking: Reported on 08/26/2022), Disp: 6 tablet, Rfl: 0   SYMBICORT 160-4.5 MCG/ACT inhaler, Inhale 2 puffs into the lungs 2 (two) times daily. (Patient not taking: Reported on 08/26/2022), Disp: , Rfl:  No Known Allergies    Social History   Socioeconomic History   Marital status: Single    Spouse name: Not on file   Number of children: 1   Years of education: Not on file   Highest education level: 10th grade  Occupational History   Occupation: Disability  Tobacco Use   Smoking status: Former    Packs/day: 0.50    Years: 50.00    Total pack years: 25.00    Types: Cigarettes    Quit date: 03/19/2022    Years since quitting: 0.4   Smokeless tobacco: Never   Tobacco comments:    Smoking cessation  Vaping Use   Vaping Use: Never used  Substance and Sexual Activity   Alcohol use: Not on file    Comment: socially   Drug use: Yes    Types: Marijuana    Comment: past   Sexual activity: Not on file  Other Topics Concern   Not on file  Social History Narrative   He lives with his daughter apparently and 5 grandchildren.  He reports that he smokes probably less than a pack of cigarettes a day.  He occasionally drinks a beer or liquor but not daily.   Social Determinants of Health   Financial Resource Strain: Low Risk  (03/11/2022)   Overall Financial Resource Strain (CARDIA)    Difficulty of Paying Living Expenses: Not very hard  Food Insecurity: No Food Insecurity (08/14/2022)   Hunger Vital Sign    Worried About Running Out of Food in the Last Year: Never true    Ran Out of Food in the Last Year: Never true  Transportation Needs: No Transportation Needs (08/14/2022)   PRAPARE - 08/16/2022  (Medical): No    Lack of Transportation (Non-Medical): No  Physical Activity: Not on file  Stress: Not on file  Social Connections: Not on file  Intimate Partner Violence: Not At Risk (08/14/2022)   Humiliation, Afraid, Rape, and Kick questionnaire    Fear of Current or Ex-Partner: No    Emotionally Abused: No    Physically Abused: No    Sexually Abused: No    Physical Exam      Future Appointments  Date Time Provider Department Center  12/31/2022 11:30 AM 08/16/2022, MD [facility]  Beatrix Shipper, EMT-Paramedic [phone] [facility] Paramedic  08/26/22

## 2022-08-26 NOTE — Telephone Encounter (Signed)
Rx(s) sent to pharmacy electronically.  

## 2022-08-28 ENCOUNTER — Other Ambulatory Visit (HOSPITAL_COMMUNITY): Payer: Self-pay | Admitting: Cardiology

## 2022-09-02 ENCOUNTER — Other Ambulatory Visit (HOSPITAL_COMMUNITY): Payer: Self-pay | Admitting: Emergency Medicine

## 2022-09-02 NOTE — Progress Notes (Signed)
Paramedicine Encounter    Patient ID: Marcus Tran, male    DOB: 1951-08-28, 71 y.o.   MRN: [id number]   BP (!) 140/90 (BP Location: Left Arm, Patient Position: Sitting, Cuff Size: Normal)   Pulse 75   Resp 16   Wt 149 lb 12.8 oz (67.9 kg)   SpO2 98%   BMI 24.93 kg/m  Weight yesterday-not taken Last visit weight-150.3lb   Home visit with Marcus Tran finds him sitting on his front porch waiting for my arrival.  Marcus Tran was A&O x 4, skin W&D w/ good color.  Lung sounds clear and equal bilat.  No edema noted to his lower extremities.  Med box reconciled x 1 week.  Struggling doing home visit due to home being infested with roaches and bedbugs. I set up his box while standing at the kitchen table and watched bugs crawl on the table and chairs.  My goal is to get him moved over to bubble packs soon so that I can keep visits at a minimum due to pest infestation.   Home visit complete.    Beatrix Shipper, EMT-Paramedic [phone] 09/02/2022   Patient Care Team: Hillery Aldo, NP as PCP - Reatha Armour, MD as PCP - Cardiology (Cardiology)  Patient Active Problem List   Diagnosis Date Noted   COPD exacerbation (HCC) 08/13/2022   Chronic HFrEF (heart failure with reduced ejection fraction) (HCC) 08/13/2022   DM2 (diabetes mellitus, type 2) (HCC) 08/13/2022   Obstructive sleep apnea (adult) (pediatric) 08/08/2022   Acute respiratory failure with hypoxia (HCC) 05/04/2022   Third degree heart block (HCC) 04/15/2022   Snoring 04/15/2022   Nonischemic cardiomyopathy (HCC) 04/15/2022   HFrEF (heart failure with reduced ejection fraction) (HCC) 04/15/2022   COPD with acute exacerbation (HCC) 03/22/2022   Acute on chronic combined systolic and diastolic CHF (congestive heart failure) (HCC)    Tobacco abuse 03/08/2022   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   HTN (hypertension) 03/08/2022   Stage 3a chronic kidney disease (CKD) (HCC) 03/08/2022   Coronary artery disease  03/08/2022    Current Outpatient Medications:    albuterol (VENTOLIN HFA) 108 (90 Base) MCG/ACT inhaler, Inhale 2 puffs into the lungs every 4 (four) hours as needed for wheezing or shortness of breath., Disp: 1 each, Rfl: 11   aspirin EC 81 MG tablet, Take 1 tablet (81 mg total) by mouth daily. Swallow whole., Disp: 90 tablet, Rfl: 3   atorvastatin (LIPITOR) 80 MG tablet, Take 1 tablet (80 mg total) by mouth every evening., Disp: 90 tablet, Rfl: 3   bisoprolol (ZEBETA) 5 MG tablet, TAKE 1 Tablet BY MOUTH ONCE DAILY, Disp: 30 tablet, Rfl: 6   Budeson-Glycopyrrol-Formoterol (BREZTRI AEROSPHERE) 160-9-4.8 MCG/ACT AERO, Inhale 2 puffs into the lungs as needed., Disp: 10.6 g, Rfl: 11   FARXIGA 10 MG TABS tablet, TAKE 1 Tablet BY MOUTH ONCE DAILY, Disp: 30 tablet, Rfl: 5   furosemide (LASIX) 40 MG tablet, Take 1 tablet (40 mg total) by mouth 2 (two) times daily., Disp: 90 tablet, Rfl: 3   ipratropium-albuterol (DUONEB) 0.5-2.5 (3) MG/3ML SOLN, Inhale 3 mLs into the lungs in the morning, at noon, in the evening, and at bedtime., Disp: 360 mL, Rfl: 11   isosorbide-hydrALAZINE (BIDIL) 20-37.5 MG tablet, Take 1 tablet by mouth 3 (three) times daily., Disp: 90 tablet, Rfl: 3   montelukast (SINGULAIR) 10 MG tablet, Take 10 mg by mouth at bedtime., Disp: , Rfl:    nitroGLYCERIN (NITROSTAT) 0.4 MG SL tablet,  Place 1 tablet (0.4 mg total) under the tongue every 5 (five) minutes as needed for chest pain., Disp: 25 tablet, Rfl: 12   potassium chloride SA (KLOR-CON M) 20 MEQ tablet, Take 1 tablet (20 mEq total) by mouth daily., Disp: 180 tablet, Rfl: 3   sacubitril-valsartan (ENTRESTO) 24-26 MG, Take 1 tablet by mouth 2 (two) times daily., Disp: 60 tablet, Rfl: 0   spironolactone (ALDACTONE) 25 MG tablet, take 1/2 tablet (12.5mg ) BY MOUTH DAILY, Disp: 15 tablet, Rfl: 3   loratadine (ALLERGY RELIEF) 10 MG tablet, Take 10 mg by mouth in the morning., Disp: , Rfl:    metFORMIN (GLUCOPHAGE) 500 MG tablet, Take 1  tablet (500 mg total) by mouth 2 (two) times daily with a meal. (Patient not taking: Reported on 08/19/2022), Disp: 60 tablet, Rfl: 0   predniSONE (DELTASONE) 20 MG tablet, Take 1-2 tablets (20-40 mg total) by mouth daily. Take 40mg  daily for 2days then 20mg  daily for 2days then STOP (Patient not taking: Reported on 08/26/2022), Disp: 6 tablet, Rfl: 0   SYMBICORT 160-4.5 MCG/ACT inhaler, Inhale 2 puffs into the lungs 2 (two) times daily. (Patient not taking: Reported on 08/26/2022), Disp: , Rfl:  No Known Allergies    Social History   Socioeconomic History   Marital status: Single    Spouse name: Not on file   Number of children: 1   Years of education: Not on file   Highest education level: 10th grade  Occupational History   Occupation: Disability  Tobacco Use   Smoking status: Former    Packs/day: 0.50    Years: 50.00    Total pack years: 25.00    Types: Cigarettes    Quit date: 03/19/2022    Years since quitting: 0.4   Smokeless tobacco: Never   Tobacco comments:    Smoking cessation  Vaping Use   Vaping Use: Never used  Substance and Sexual Activity   Alcohol use: Not on file    Comment: socially   Drug use: Yes    Types: Marijuana    Comment: past   Sexual activity: Not on file  Other Topics Concern   Not on file  Social History Narrative   He lives with his daughter apparently and 5 grandchildren.  He reports that he smokes probably less than a pack of cigarettes a day.  He occasionally drinks a beer or liquor but not daily.   Social Determinants of Health   Financial Resource Strain: Low Risk  (03/11/2022)   Overall Financial Resource Strain (CARDIA)    Difficulty of Paying Living Expenses: Not very hard  Food Insecurity: No Food Insecurity (08/14/2022)   Hunger Vital Sign    Worried About Running Out of Food in the Last Year: Never true    Ran Out of Food in the Last Year: Never true  Transportation Needs: No Transportation Needs (08/14/2022)   PRAPARE -  08/16/2022 (Medical): No    Lack of Transportation (Non-Medical): No  Physical Activity: Not on file  Stress: Not on file  Social Connections: Not on file  Intimate Partner Violence: Not At Risk (08/14/2022)   Humiliation, Afraid, Rape, and Kick questionnaire    Fear of Current or Ex-Partner: No    Emotionally Abused: No    Physically Abused: No    Sexually Abused: No    Physical Exam      Future Appointments  Date Time Provider Department Center  12/31/2022 11:30 AM 08/16/2022,  MD [facility]       Beatrix Shipper, EMT-Paramedic [phone] [facility] Paramedic  09/02/22

## 2022-09-09 ENCOUNTER — Other Ambulatory Visit (HOSPITAL_COMMUNITY): Payer: Self-pay | Admitting: *Deleted

## 2022-09-09 ENCOUNTER — Other Ambulatory Visit (HOSPITAL_COMMUNITY): Payer: Self-pay | Admitting: Emergency Medicine

## 2022-09-09 MED ORDER — ENTRESTO 24-26 MG PO TABS: 1.0000 | ORAL_TABLET | Freq: Two times a day (BID) | ORAL | 0 refills | Status: DC

## 2022-09-09 NOTE — Progress Notes (Signed)
Paramedicine Encounter    Patient ID: Marcus Tran, male    DOB: June 24, 1951, 71 y.o.   MRN: [id number]   BP 120/80 (BP Location: Left Arm, Patient Position: Sitting, Cuff Size: Normal)   Pulse 64   Resp 16   Wt 151 lb 12.8 oz (68.9 kg)   SpO2 98%   BMI 25.26 kg/m  Weight yesterday-not taken Last visit weight-149lb   Patient Care Team: Hillery Aldo, NP as PCP - General Rollene Rotunda, MD as PCP - Cardiology (Cardiology)  Patient Active Problem List   Diagnosis Date Noted  . COPD exacerbation (HCC) 08/13/2022  . Chronic HFrEF (heart failure with reduced ejection fraction) (HCC) 08/13/2022  . DM2 (diabetes mellitus, type 2) (HCC) 08/13/2022  . Obstructive sleep apnea (adult) (pediatric) 08/08/2022  . Acute respiratory failure with hypoxia (HCC) 05/04/2022  . Third degree heart block (HCC) 04/15/2022  . Snoring 04/15/2022  . Nonischemic cardiomyopathy (HCC) 04/15/2022  . HFrEF (heart failure with reduced ejection fraction) (HCC) 04/15/2022  . COPD with acute exacerbation (HCC) 03/22/2022  . Acute on chronic combined systolic and diastolic CHF (congestive heart failure) (HCC)   . Tobacco abuse 03/08/2022  . 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 HTN (hypertension) 03/08/2022  . Stage 3a chronic kidney disease (CKD) (HCC) 03/08/2022  . Coronary artery disease 03/08/2022    Current Outpatient Medications:  .  albuterol (VENTOLIN HFA) 108 (90 Base) MCG/ACT inhaler, Inhale 2 puffs into the lungs every 4 (four) hours as needed for wheezing or shortness of breath., Disp: 1 each, Rfl: 11 .  aspirin EC 81 MG tablet, Take 1 tablet (81 mg total) by mouth daily. Swallow whole., Disp: 90 tablet, Rfl: 3 .  atorvastatin (LIPITOR) 80 MG tablet, Take 1 tablet (80 mg total) by mouth every evening., Disp: 90 tablet, Rfl: 3 .  bisoprolol (ZEBETA) 5 MG tablet, TAKE 1 Tablet BY MOUTH ONCE DAILY, Disp: 30 tablet, Rfl: 6 .  Budeson-Glycopyrrol-Formoterol (BREZTRI AEROSPHERE) 160-9-4.8  MCG/ACT AERO, Inhale 2 puffs into the lungs as needed., Disp: 10.6 g, Rfl: 11 .  FARXIGA 10 MG TABS tablet, TAKE 1 Tablet BY MOUTH ONCE DAILY, Disp: 30 tablet, Rfl: 5 .  furosemide (LASIX) 40 MG tablet, Take 1 tablet (40 mg total) by mouth 2 (two) times daily., Disp: 90 tablet, Rfl: 3 .  ipratropium-albuterol (DUONEB) 0.5-2.5 (3) MG/3ML SOLN, Inhale 3 mLs into the lungs in the morning, at noon, in the evening, and at bedtime., Disp: 360 mL, Rfl: 11 .  isosorbide-hydrALAZINE (BIDIL) 20-37.5 MG tablet, Take 1 tablet by mouth 3 (three) times daily., Disp: 90 tablet, Rfl: 3 .  loratadine (ALLERGY RELIEF) 10 MG tablet, Take 10 mg by mouth in the morning., Disp: , Rfl:  .  metFORMIN (GLUCOPHAGE) 500 MG tablet, Take 1 tablet (500 mg total) by mouth 2 (two) times daily with a meal. (Patient not taking: Reported on 08/19/2022), Disp: 60 tablet, Rfl: 0 .  montelukast (SINGULAIR) 10 MG tablet, Take 10 mg by mouth at bedtime., Disp: , Rfl:  .  nitroGLYCERIN (NITROSTAT) 0.4 MG SL tablet, Place 1 tablet (0.4 mg total) under the tongue every 5 (five) minutes as needed for chest pain., Disp: 25 tablet, Rfl: 12 .  potassium chloride SA (KLOR-CON M) 20 MEQ tablet, Take 1 tablet (20 mEq total) by mouth daily., Disp: 180 tablet, Rfl: 3 .  predniSONE (DELTASONE) 20 MG tablet, Take 1-2 tablets (20-40 mg total) by mouth daily. Take 40mg  daily for 2days then 20mg  daily for 2days  then STOP (Patient not taking: Reported on 08/26/2022), Disp: 6 tablet, Rfl: 0 .  sacubitril-valsartan (ENTRESTO) 24-26 MG, Take 1 tablet by mouth 2 (two) times daily., Disp: 60 tablet, Rfl: 0 .  spironolactone (ALDACTONE) 25 MG tablet, take 1/2 tablet (12.5mg ) BY MOUTH DAILY, Disp: 15 tablet, Rfl: 3 .  SYMBICORT 160-4.5 MCG/ACT inhaler, Inhale 2 puffs into the lungs 2 (two) times daily. (Patient not taking: Reported on 08/26/2022), Disp: , Rfl:  No Known Allergies    Social History   Socioeconomic History  . Marital status: Single    Spouse  name: Not on file  . Number of children: 1  . Years of education: Not on file  . Highest education level: 10th grade  Occupational History  . Occupation: Disability  Tobacco Use  . Smoking status: Former    Packs/day: 0.50    Years: 50.00    Total pack years: 25.00    Types: Cigarettes    Quit date: 03/19/2022    Years since quitting: 0.4  . Smokeless tobacco: Never  . Tobacco comments:    Smoking cessation  Vaping Use  . Vaping Use: Never used  Substance and Sexual Activity  . Alcohol use: Not on file    Comment: socially  . Drug use: Yes    Types: Marijuana    Comment: past  . Sexual activity: Not on file  Other Topics Concern  . Not on file  Social History Narrative   He lives with his daughter apparently and 5 grandchildren.  He reports that he smokes probably less than a pack of cigarettes a day.  He occasionally drinks a beer or liquor but not daily.   Social Determinants of Health   Financial Resource Strain: Low Risk  (03/11/2022)   Overall Financial Resource Strain (CARDIA)   . Difficulty of Paying Living Expenses: Not very hard  Food Insecurity: No Food Insecurity (08/14/2022)   Hunger Vital Sign   . Worried About Programme researcher, broadcasting/film/video in the Last Year: Never true   . Ran Out of Food in the Last Year: Never true  Transportation Needs: No Transportation Needs (08/14/2022)   PRAPARE - Transportation   . Lack of Transportation (Medical): No   . Lack of Transportation (Non-Medical): No  Physical Activity: Not on file  Stress: Not on file  Social Connections: Not on file  Intimate Partner Violence: Not At Risk (08/14/2022)   Humiliation, Afraid, Rape, and Kick questionnaire   . Fear of Current or Ex-Partner: No   . Emotionally Abused: No   . Physically Abused: No   . Sexually Abused: No    Physical Exam      Future Appointments  Date Time Provider Department Center  12/31/2022 11:30 AM Marinus Maw, MD [facility]       Beatrix Shipper,  EMT-P-Paramedic [phone] [facility] Paramedic  09/09/22

## 2022-09-16 ENCOUNTER — Other Ambulatory Visit (HOSPITAL_COMMUNITY): Payer: Self-pay | Admitting: Emergency Medicine

## 2022-09-16 NOTE — Progress Notes (Signed)
Paramedicine Encounter    Patient ID: Marcus Tran, male    DOB: [birth year], 71 y.o.   MRN: [id number]   BP 120/84 (BP Location: Right Arm, Patient Position: Standing, Cuff Size: Normal)   Pulse 70   Resp 16   Wt 153 lb (69.4 kg)   SpO2 95%   BMI 25.46 kg/m  Weight yesterday-not taken Last visit weight-151lbs.  ATF Mr. Fenech A&O x 4, skin W&D w/ good color.  Pt. Denies chest pain or SOB.  Lung sounds clear and equal bilat.  No edema noted.  He has been compliant with all meds.  Med box reconciled x 1 week.  Refills called in: Albuterol, Brezetri, Entresto, Loratadine, ASA to be delivered tomorrow.  No pending appointments till 12/2022.  Home visit complete.    Beatrix Shipper, EMT-Paramedic [phone] 09/16/2022   Patient Care Team: Hillery Aldo, NP as PCP - Reatha Armour, MD as PCP - Cardiology (Cardiology)  Patient Active Problem List   Diagnosis Date Noted   COPD exacerbation (HCC) 08/13/2022   Chronic HFrEF (heart failure with reduced ejection fraction) (HCC) 08/13/2022   DM2 (diabetes mellitus, type 2) (HCC) 08/13/2022   Obstructive sleep apnea (adult) (pediatric) 08/08/2022   Acute respiratory failure with hypoxia (HCC) 05/04/2022   Third degree heart block (HCC) 04/15/2022   Snoring 04/15/2022   Nonischemic cardiomyopathy (HCC) 04/15/2022   HFrEF (heart failure with reduced ejection fraction) (HCC) 04/15/2022   COPD with acute exacerbation (HCC) 03/22/2022   Acute on chronic combined systolic and diastolic CHF (congestive heart failure) (HCC)    Tobacco abuse 03/08/2022   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   HTN (hypertension) 03/08/2022   Stage 3a chronic kidney disease (CKD) (HCC) 03/08/2022   Coronary artery disease 03/08/2022    Current Outpatient Medications:    albuterol (VENTOLIN HFA) 108 (90 Base) MCG/ACT inhaler, Inhale 2 puffs into the lungs every 4 (four) hours as needed for wheezing or shortness of breath., Disp: 1 each, Rfl: 11    aspirin EC 81 MG tablet, Take 1 tablet (81 mg total) by mouth daily. Swallow whole., Disp: 90 tablet, Rfl: 3   atorvastatin (LIPITOR) 80 MG tablet, Take 1 tablet (80 mg total) by mouth every evening., Disp: 90 tablet, Rfl: 3   bisoprolol (ZEBETA) 5 MG tablet, TAKE 1 Tablet BY MOUTH ONCE DAILY, Disp: 30 tablet, Rfl: 6   FARXIGA 10 MG TABS tablet, TAKE 1 Tablet BY MOUTH ONCE DAILY, Disp: 30 tablet, Rfl: 5   furosemide (LASIX) 40 MG tablet, Take 1 tablet (40 mg total) by mouth 2 (two) times daily., Disp: 90 tablet, Rfl: 3   ipratropium-albuterol (DUONEB) 0.5-2.5 (3) MG/3ML SOLN, Inhale 3 mLs into the lungs in the morning, at noon, in the evening, and at bedtime., Disp: 360 mL, Rfl: 11   isosorbide-hydrALAZINE (BIDIL) 20-37.5 MG tablet, Take 1 tablet by mouth 3 (three) times daily., Disp: 90 tablet, Rfl: 3   sacubitril-valsartan (ENTRESTO) 24-26 MG, Take 1 tablet by mouth 2 (two) times daily., Disp: 60 tablet, Rfl: 0   spironolactone (ALDACTONE) 25 MG tablet, take 1/2 tablet (12.5mg ) BY MOUTH DAILY, Disp: 15 tablet, Rfl: 3   Budeson-Glycopyrrol-Formoterol (BREZTRI AEROSPHERE) 160-9-4.8 MCG/ACT AERO, Inhale 2 puffs into the lungs as needed., Disp: 10.6 g, Rfl: 11   loratadine (ALLERGY RELIEF) 10 MG tablet, Take 10 mg by mouth in the morning., Disp: , Rfl:    metFORMIN (GLUCOPHAGE) 500 MG tablet, Take 1 tablet (500 mg total) by mouth 2 (two) times daily  with a meal. (Patient not taking: Reported on 08/19/2022), Disp: 60 tablet, Rfl: 0   montelukast (SINGULAIR) 10 MG tablet, Take 10 mg by mouth at bedtime., Disp: , Rfl:    nitroGLYCERIN (NITROSTAT) 0.4 MG SL tablet, Place 1 tablet (0.4 mg total) under the tongue every 5 (five) minutes as needed for chest pain. (Patient not taking: Reported on 09/09/2022), Disp: 25 tablet, Rfl: 12   potassium chloride SA (KLOR-CON M) 20 MEQ tablet, Take 1 tablet (20 mEq total) by mouth daily., Disp: 180 tablet, Rfl: 3   predniSONE (DELTASONE) 20 MG tablet, Take 1-2 tablets  (20-40 mg total) by mouth daily. Take 40mg  daily for 2days then 20mg  daily for 2days then STOP (Patient not taking: Reported on 08/26/2022), Disp: 6 tablet, Rfl: 0   SYMBICORT 160-4.5 MCG/ACT inhaler, Inhale 2 puffs into the lungs 2 (two) times daily. (Patient not taking: Reported on 08/26/2022), Disp: , Rfl:  No Known Allergies    Social History   Socioeconomic History   Marital status: Single    Spouse name: Not on file   Number of children: 1   Years of education: Not on file   Highest education level: 10th grade  Occupational History   Occupation: Disability  Tobacco Use   Smoking status: Former    Packs/day: 0.50    Years: 50.00    Total pack years: 25.00    Types: Cigarettes    Quit date: 03/19/2022    Years since quitting: 0.4   Smokeless tobacco: Never   Tobacco comments:    Smoking cessation  Vaping Use   Vaping Use: Never used  Substance and Sexual Activity   Alcohol use: Not on file    Comment: socially   Drug use: Yes    Types: Marijuana    Comment: past   Sexual activity: Not on file  Other Topics Concern   Not on file  Social History Narrative   He lives with his daughter apparently and 5 grandchildren.  He reports that he smokes probably less than a pack of cigarettes a day.  He occasionally drinks a beer or liquor but not daily.   Social Determinants of Health   Financial Resource Strain: Low Risk  (03/11/2022)   Overall Financial Resource Strain (CARDIA)    Difficulty of Paying Living Expenses: Not very hard  Food Insecurity: No Food Insecurity (08/14/2022)   Hunger Vital Sign    Worried About Running Out of Food in the Last Year: Never true    Ran Out of Food in the Last Year: Never true  Transportation Needs: No Transportation Needs (08/14/2022)   PRAPARE - 08/16/2022 (Medical): No    Lack of Transportation (Non-Medical): No  Physical Activity: Not on file  Stress: Not on file  Social Connections: Not on file   Intimate Partner Violence: Not At Risk (08/14/2022)   Humiliation, Afraid, Rape, and Kick questionnaire    Fear of Current or Ex-Partner: No    Emotionally Abused: No    Physically Abused: No    Sexually Abused: No    Physical Exam      Future Appointments  Date Time Provider Department Center  12/31/2022 11:30 AM 08/16/2022, MD [facility]       01/02/2023, EMT-P-Paramedic [phone] [facility] Paramedic  09/16/22

## 2022-09-23 ENCOUNTER — Other Ambulatory Visit (HOSPITAL_COMMUNITY): Payer: Self-pay | Admitting: Emergency Medicine

## 2022-09-23 NOTE — Progress Notes (Signed)
Paramedicine Encounter    Patient ID: Marcus Tran, male    DOB: 22-Nov-1950, 71 y.o.   MRN: [id number]   BP 126/80 (BP Location: Right Arm, Patient Position: Standing, Cuff Size: Normal)   Pulse 76   Wt 142 lb 3.2 oz (64.5 kg)   SpO2 98%   BMI 23.66 kg/m  Weight yesterday-not taken Last visit weight-153lb  ATF Mr. Seivert A&O x 4, skin W&D w/ good color.  Pt has done well to be compliant with all meds.  He says he feels good w/ no chest pain or SOB.  He does have a mildly productive cough with thick white sputum.  He states this is nothing new for him.  Lung sounds with ronchi bilaterally and no wheezing noted.  He does his home duo neb daily prn.  Med box reconciled x 1 week w/o incident.  No pending appointments. Home visit complete.    Renee Ramus, [place] 09/23/2022  Patient Care Team: Cipriano Mile, NP as PCP - Cheral Bay, MD as PCP - Cardiology (Cardiology)  Patient Active Problem List   Diagnosis Date Noted   COPD exacerbation ([place]) 08/13/2022   Chronic HFrEF (heart failure with reduced ejection fraction) ([place]) 08/13/2022   DM2 (diabetes mellitus, type 2) ([place]) 08/13/2022   Obstructive sleep apnea (adult) (pediatric) 08/08/2022   Acute respiratory failure with hypoxia ([place]) 05/04/2022   Third degree heart block ([place]) 04/15/2022   Snoring 04/15/2022   Nonischemic cardiomyopathy ([place]) 04/15/2022   HFrEF (heart failure with reduced ejection fraction) ([place]) 04/15/2022   COPD with acute exacerbation ([place]) 03/22/2022   Acute on chronic combined systolic and diastolic CHF (congestive heart failure) ([place])    Tobacco abuse 03/08/2022   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   HTN (hypertension) 03/08/2022   Stage 3a chronic kidney disease (CKD) ([place]) 03/08/2022   Coronary artery disease 03/08/2022    Current Outpatient Medications:    aspirin EC 81 MG tablet, Take 1 tablet (81 mg total) by mouth daily. Swallow whole., Disp: 90 tablet,  Rfl: 3   atorvastatin (LIPITOR) 80 MG tablet, Take 1 tablet (80 mg total) by mouth every evening., Disp: 90 tablet, Rfl: 3   bisoprolol (ZEBETA) 5 MG tablet, TAKE 1 Tablet BY MOUTH ONCE DAILY, Disp: 30 tablet, Rfl: 6   FARXIGA 10 MG TABS tablet, TAKE 1 Tablet BY MOUTH ONCE DAILY, Disp: 30 tablet, Rfl: 5   furosemide (LASIX) 40 MG tablet, Take 1 tablet (40 mg total) by mouth 2 (two) times daily., Disp: 90 tablet, Rfl: 3   ipratropium-albuterol (DUONEB) 0.5-2.5 (3) MG/3ML SOLN, Inhale 3 mLs into the lungs in the morning, at noon, in the evening, and at bedtime., Disp: 360 mL, Rfl: 11   isosorbide-hydrALAZINE (BIDIL) 20-37.5 MG tablet, Take 1 tablet by mouth 3 (three) times daily., Disp: 90 tablet, Rfl: 3   montelukast (SINGULAIR) 10 MG tablet, Take 10 mg by mouth at bedtime., Disp: , Rfl:    potassium chloride SA (KLOR-CON M) 20 MEQ tablet, Take 1 tablet (20 mEq total) by mouth daily., Disp: 180 tablet, Rfl: 3   sacubitril-valsartan (ENTRESTO) 24-26 MG, Take 1 tablet by mouth 2 (two) times daily., Disp: 60 tablet, Rfl: 0   spironolactone (ALDACTONE) 25 MG tablet, take 1/2 tablet (12.5mg ) BY MOUTH DAILY, Disp: 15 tablet, Rfl: 3   albuterol (VENTOLIN HFA) 108 (90 Base) MCG/ACT inhaler, Inhale 2 puffs into the lungs every 4 (four) hours as needed for wheezing or shortness of breath. (Patient not taking:  Reported on 09/23/2022), Disp: 1 each, Rfl: 11   Budeson-Glycopyrrol-Formoterol (BREZTRI AEROSPHERE) 160-9-4.8 MCG/ACT AERO, Inhale 2 puffs into the lungs as needed. (Patient not taking: Reported on 09/23/2022), Disp: 10.6 g, Rfl: 11   loratadine (ALLERGY RELIEF) 10 MG tablet, Take 10 mg by mouth in the morning. (Patient not taking: Reported on 09/23/2022), Disp: , Rfl:    metFORMIN (GLUCOPHAGE) 500 MG tablet, Take 1 tablet (500 mg total) by mouth 2 (two) times daily with a meal. (Patient not taking: Reported on 08/19/2022), Disp: 60 tablet, Rfl: 0   nitroGLYCERIN (NITROSTAT) 0.4 MG SL tablet, Place 1 tablet  (0.4 mg total) under the tongue every 5 (five) minutes as needed for chest pain. (Patient not taking: Reported on 09/09/2022), Disp: 25 tablet, Rfl: 12   predniSONE (DELTASONE) 20 MG tablet, Take 1-2 tablets (20-40 mg total) by mouth daily. Take 40mg  daily for 2days then 20mg  daily for 2days then STOP (Patient not taking: Reported on 08/26/2022), Disp: 6 tablet, Rfl: 0   SYMBICORT 160-4.5 MCG/ACT inhaler, Inhale 2 puffs into the lungs 2 (two) times daily. (Patient not taking: Reported on 08/26/2022), Disp: , Rfl:  No Known Allergies    Social History   Socioeconomic History   Marital status: Single    Spouse name: Not on file   Number of children: 1   Years of education: Not on file   Highest education level: 10th grade  Occupational History   Occupation: Disability  Tobacco Use   Smoking status: Former    Packs/day: 0.50    Years: 50.00    Total pack years: 25.00    Types: Cigarettes    Quit date: 03/19/2022    Years since quitting: 0.5   Smokeless tobacco: Never   Tobacco comments:    Smoking cessation  Vaping Use   Vaping Use: Never used  Substance and Sexual Activity   Alcohol use: Not on file    Comment: socially   Drug use: Yes    Types: Marijuana    Comment: past   Sexual activity: Not on file  Other Topics Concern   Not on file  Social History Narrative   He lives with his daughter apparently and 5 grandchildren.  He reports that he smokes probably less than a pack of cigarettes a day.  He occasionally drinks a beer or liquor but not daily.   Social Determinants of Health   Financial Resource Strain: Low Risk  (03/11/2022)   Overall Financial Resource Strain (CARDIA)    Difficulty of Paying Living Expenses: Not very hard  Food Insecurity: No Food Insecurity (08/14/2022)   Hunger Vital Sign    Worried About Running Out of Food in the Last Year: Never true    Ran Out of Food in the Last Year: Never true  Transportation Needs: No Transportation Needs (08/14/2022)    PRAPARE - Hydrologist (Medical): No    Lack of Transportation (Non-Medical): No  Physical Activity: Not on file  Stress: Not on file  Social Connections: Not on file  Intimate Partner Violence: Not At Risk (08/14/2022)   Humiliation, Afraid, Rape, and Kick questionnaire    Fear of Current or Ex-Partner: No    Emotionally Abused: No    Physically Abused: No    Sexually Abused: No    Physical Exam      Future Appointments  Date Time Provider [place]  12/31/2022 11:30 AM Evans Lance, MD [facility]  Beatrix Shipper, EMT-P-Paramedic [phone] [facility] Paramedic  09/23/22

## 2022-09-25 ENCOUNTER — Other Ambulatory Visit (HOSPITAL_COMMUNITY): Payer: Self-pay | Admitting: Cardiology

## 2022-09-30 ENCOUNTER — Other Ambulatory Visit (HOSPITAL_COMMUNITY): Payer: Self-pay | Admitting: Emergency Medicine

## 2022-09-30 NOTE — Progress Notes (Signed)
Paramedicine Encounter    Patient ID: Marcus Tran, male    DOB: 11/09/1950, 71 y.o.   MRN: [id number]   There were no vitals taken for this visit. Weight yesterday-not taken Last visit weight-153lb  ATF Mr. Russ A&O x 4, skin W&D w/ good color.  Pt. Denies chest pain or SOB and no edema noted.  Lung sounds with mild expiratory wheezes in the bases bilaterally.  He does state he did a DuoNeb this morning.  He has been 100% compliant w/ meds since last visit.  Med box reconciled x 1 week.  No pending appointments before the first of the year.  Home visit complete.   Beatrix Shipper, EMT-Paramedic [phone] 09/30/2022   Patient Care Team: Hillery Aldo, NP as PCP - Reatha Armour, MD as PCP - Cardiology (Cardiology)  Patient Active Problem List   Diagnosis Date Noted   COPD exacerbation ([facility]) 08/13/2022   Chronic HFrEF (heart failure with reduced ejection fraction) ([facility]) 08/13/2022   DM2 (diabetes mellitus, type 2) ([facility]) 08/13/2022   Obstructive sleep apnea (adult) (pediatric) 08/08/2022   Acute respiratory failure with hypoxia ([facility]) 05/04/2022   Third degree heart block ([facility]) 04/15/2022   Snoring 04/15/2022   Nonischemic cardiomyopathy ([facility]) 04/15/2022   HFrEF (heart failure with reduced ejection fraction) (H[facility]) 04/15/2022   COPD with acute exacerbation ([facility]) 03/22/2022   Acute on chronic combined systolic and diastolic CHF (congestive heart failure) ([facility])    Tobacco abuse 03/08/2022   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   HTN (hypertension) 03/08/2022   Stage 3a chronic kidney disease (CKD) ([facility]) 03/08/2022   Coronary artery disease 03/08/2022    Current Outpatient Medications:    albuterol (VENTOLIN HFA) 108 (90 Base) MCG/ACT inhaler, Inhale 2 puffs into the lungs every 4 (four) hours as needed for wheezing or shortness of breath. (Patient not taking: Reported on 09/23/2022), Disp: 1 each, Rfl: 11   aspirin EC 81 MG tablet, Take 1 tablet (81 mg total) by  mouth daily. Swallow whole., Disp: 90 tablet, Rfl: 3   atorvastatin (LIPITOR) 80 MG tablet, Take 1 tablet (80 mg total) by mouth every evening., Disp: 90 tablet, Rfl: 3   bisoprolol (ZEBETA) 5 MG tablet, TAKE 1 Tablet BY MOUTH ONCE DAILY, Disp: 30 tablet, Rfl: 6   Budeson-Glycopyrrol-Formoterol (BREZTRI AEROSPHERE) 160-9-4.8 MCG/ACT AERO, Inhale 2 puffs into the lungs as needed. (Patient not taking: Reported on 09/23/2022), Disp: 10.6 g, Rfl: 11   FARXIGA 10 MG TABS tablet, TAKE 1 Tablet BY MOUTH ONCE DAILY, Disp: 30 tablet, Rfl: 5   furosemide (LASIX) 40 MG tablet, TAKE 1 Tablet BY MOUTH TWICE DAILY, Disp: 90 tablet, Rfl: 3   ipratropium-albuterol (DUONEB) 0.5-2.5 (3) MG/3ML SOLN, Inhale 3 mLs into the lungs in the morning, at noon, in the evening, and at bedtime., Disp: 360 mL, Rfl: 11   isosorbide-hydrALAZINE (BIDIL) 20-37.5 MG tablet, Take 1 tablet by mouth 3 (three) times daily., Disp: 90 tablet, Rfl: 3   loratadine (ALLERGY RELIEF) 10 MG tablet, Take 10 mg by mouth in the morning. (Patient not taking: Reported on 09/23/2022), Disp: , Rfl:    metFORMIN (GLUCOPHAGE) 500 MG tablet, Take 1 tablet (500 mg total) by mouth 2 (two) times daily with a meal. (Patient not taking: Reported on 08/19/2022), Disp: 60 tablet, Rfl: 0   montelukast (SINGULAIR) 10 MG tablet, Take 10 mg by mouth at bedtime., Disp: , Rfl:    nitroGLYCERIN (NITROSTAT) 0.4 MG SL tablet, Place 1 tablet (0.4 mg total) under the  tongue every 5 (five) minutes as needed for chest pain. (Patient not taking: Reported on 09/09/2022), Disp: 25 tablet, Rfl: 12   potassium chloride SA (KLOR-CON M) 20 MEQ tablet, Take 1 tablet (20 mEq total) by mouth daily., Disp: 180 tablet, Rfl: 3   predniSONE (DELTASONE) 20 MG tablet, Take 1-2 tablets (20-40 mg total) by mouth daily. Take 40mg  daily for 2days then 20mg  daily for 2days then STOP (Patient not taking: Reported on 08/26/2022), Disp: 6 tablet, Rfl: 0   sacubitril-valsartan (ENTRESTO) 24-26 MG, Take 1  tablet by mouth 2 (two) times daily., Disp: 90 tablet, Rfl: 1   spironolactone (ALDACTONE) 25 MG tablet, take 1/2 tablet (12.5mg ) BY MOUTH DAILY, Disp: 15 tablet, Rfl: 3   SYMBICORT 160-4.5 MCG/ACT inhaler, Inhale 2 puffs into the lungs 2 (two) times daily. (Patient not taking: Reported on 08/26/2022), Disp: , Rfl:  No Known Allergies    Social History   Socioeconomic History   Marital status: Single    Spouse name: Not on file   Number of children: 1   Years of education: Not on file   Highest education level: 10th grade  Occupational History   Occupation: Disability  Tobacco Use   Smoking status: Former    Packs/day: 0.50    Years: 50.00    Total pack years: 25.00    Types: Cigarettes    Quit date: 03/19/2022    Years since quitting: 0.5   Smokeless tobacco: Never   Tobacco comments:    Smoking cessation  Vaping Use   Vaping Use: Never used  Substance and Sexual Activity   Alcohol use: Not on file    Comment: socially   Drug use: Yes    Types: Marijuana    Comment: past   Sexual activity: Not on file  Other Topics Concern   Not on file  Social History Narrative   He lives with his daughter apparently and 5 grandchildren.  He reports that he smokes probably less than a pack of cigarettes a day.  He occasionally drinks a beer or liquor but not daily.   Social Determinants of Health   Financial Resource Strain: Low Risk  (03/11/2022)   Overall Financial Resource Strain (CARDIA)    Difficulty of Paying Living Expenses: Not very hard  Food Insecurity: No Food Insecurity (08/14/2022)   Hunger Vital Sign    Worried About Running Out of Food in the Last Year: Never true    Ran Out of Food in the Last Year: Never true  Transportation Needs: No Transportation Needs (08/14/2022)   PRAPARE - 08/16/2022 (Medical): No    Lack of Transportation (Non-Medical): No  Physical Activity: Not on file  Stress: Not on file  Social Connections: Not on file   Intimate Partner Violence: Not At Risk (08/14/2022)   Humiliation, Afraid, Rape, and Kick questionnaire    Fear of Current or Ex-Partner: No    Emotionally Abused: No    Physically Abused: No    Sexually Abused: No    Physical Exam      Future Appointments  Date Time Provider Department Center  12/31/2022 11:30 AM 08/16/2022, MD [facility]       01/02/2023, EMT-P-Paramedic [phone] [facility] Paramedic  09/30/22

## 2022-10-07 ENCOUNTER — Other Ambulatory Visit (HOSPITAL_COMMUNITY): Payer: Self-pay | Admitting: Emergency Medicine

## 2022-10-07 NOTE — Progress Notes (Signed)
Paramedicine Encounter    Patient ID: Marcus Tran, male    DOB: 10/29/1950, 71 y.o.   MRN: [id number]   BP 130/80 (BP Location: Right Arm, Patient Position: Sitting)   Pulse 65   Wt 155 lb 12.8 oz (70.7 kg)   SpO2 98%   BMI 25.93 kg/m  Weight yesterday-not taken Last visit weight-152lb  ATF Mr. Surgeon to be A&O x 4, skin W&D w/ good color.  He denies chest pain or SOB.  No edema noted.  Lung sounds with expiratory wheezing which is baseline for him.  He does home neb treatments.  Med box reconciled x 1 week.  No refills needed at this time. Pt's daughter reported that he had a syncopal episode where EMS had to come out to evaluate him.  I was unaware of this till today.  He was not transported to the hospital.  I advised him that he is to keep me informed when something like this happens and he advised he said he would do so in the future.  He states he feels fine today.  No pending appointments.  Home visit complete.    Beatrix Shipper, EMT-Paramedic [phone] 10/07/2022   Patient Care Team: Hillery Aldo, NP as PCP - Reatha Armour, MD as PCP - Cardiology (Cardiology)  Patient Active Problem List   Diagnosis Date Noted   COPD exacerbation (HCC) 08/13/2022   Chronic HFrEF (heart failure with reduced ejection fraction) (HCC) 08/13/2022   DM2 (diabetes mellitus, type 2) (HCC) 08/13/2022   Obstructive sleep apnea (adult) (pediatric) 08/08/2022   Acute respiratory failure with hypoxia (HCC) 05/04/2022   Third degree heart block (HCC) 04/15/2022   Snoring 04/15/2022   Nonischemic cardiomyopathy (HCC) 04/15/2022   HFrEF (heart failure with reduced ejection fraction) (HCC) 04/15/2022   COPD with acute exacerbation (HCC) 03/22/2022   Acute on chronic combined systolic and diastolic CHF (congestive heart failure) (HCC)    Tobacco abuse 03/08/2022   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   HTN (hypertension) 03/08/2022   Stage 3a chronic kidney disease (CKD) (HCC)  03/08/2022   Coronary artery disease 03/08/2022    Current Outpatient Medications:    aspirin EC 81 MG tablet, Take 1 tablet (81 mg total) by mouth daily. Swallow whole., Disp: 90 tablet, Rfl: 3   atorvastatin (LIPITOR) 80 MG tablet, Take 1 tablet (80 mg total) by mouth every evening., Disp: 90 tablet, Rfl: 3   bisoprolol (ZEBETA) 5 MG tablet, TAKE 1 Tablet BY MOUTH ONCE DAILY, Disp: 30 tablet, Rfl: 6   FARXIGA 10 MG TABS tablet, TAKE 1 Tablet BY MOUTH ONCE DAILY, Disp: 30 tablet, Rfl: 5   furosemide (LASIX) 40 MG tablet, TAKE 1 Tablet BY MOUTH TWICE DAILY, Disp: 90 tablet, Rfl: 3   ipratropium-albuterol (DUONEB) 0.5-2.5 (3) MG/3ML SOLN, Inhale 3 mLs into the lungs in the morning, at noon, in the evening, and at bedtime., Disp: 360 mL, Rfl: 11   isosorbide-hydrALAZINE (BIDIL) 20-37.5 MG tablet, Take 1 tablet by mouth 3 (three) times daily., Disp: 90 tablet, Rfl: 3   montelukast (SINGULAIR) 10 MG tablet, Take 10 mg by mouth at bedtime., Disp: , Rfl:    potassium chloride SA (KLOR-CON M) 20 MEQ tablet, Take 1 tablet (20 mEq total) by mouth daily., Disp: 180 tablet, Rfl: 3   sacubitril-valsartan (ENTRESTO) 24-26 MG, Take 1 tablet by mouth 2 (two) times daily., Disp: 90 tablet, Rfl: 1   spironolactone (ALDACTONE) 25 MG tablet, take 1/2 tablet (12.5mg ) BY MOUTH DAILY, Disp:  15 tablet, Rfl: 3   albuterol (VENTOLIN HFA) 108 (90 Base) MCG/ACT inhaler, Inhale 2 puffs into the lungs every 4 (four) hours as needed for wheezing or shortness of breath. (Patient not taking: Reported on 09/23/2022), Disp: 1 each, Rfl: 11   Budeson-Glycopyrrol-Formoterol (BREZTRI AEROSPHERE) 160-9-4.8 MCG/ACT AERO, Inhale 2 puffs into the lungs as needed. (Patient not taking: Reported on 09/23/2022), Disp: 10.6 g, Rfl: 11   loratadine (ALLERGY RELIEF) 10 MG tablet, Take 10 mg by mouth in the morning. (Patient not taking: Reported on 09/23/2022), Disp: , Rfl:    metFORMIN (GLUCOPHAGE) 500 MG tablet, Take 1 tablet (500 mg total) by  mouth 2 (two) times daily with a meal. (Patient not taking: Reported on 08/19/2022), Disp: 60 tablet, Rfl: 0   nitroGLYCERIN (NITROSTAT) 0.4 MG SL tablet, Place 1 tablet (0.4 mg total) under the tongue every 5 (five) minutes as needed for chest pain. (Patient not taking: Reported on 09/09/2022), Disp: 25 tablet, Rfl: 12   predniSONE (DELTASONE) 20 MG tablet, Take 1-2 tablets (20-40 mg total) by mouth daily. Take 40mg  daily for 2days then 20mg  daily for 2days then STOP (Patient not taking: Reported on 08/26/2022), Disp: 6 tablet, Rfl: 0   SYMBICORT 160-4.5 MCG/ACT inhaler, Inhale 2 puffs into the lungs 2 (two) times daily. (Patient not taking: Reported on 08/26/2022), Disp: , Rfl:  No Known Allergies    Social History   Socioeconomic History   Marital status: Single    Spouse name: Not on file   Number of children: 1   Years of education: Not on file   Highest education level: 10th grade  Occupational History   Occupation: Disability  Tobacco Use   Smoking status: Former    Packs/day: 0.50    Years: 50.00    Total pack years: 25.00    Types: Cigarettes    Quit date: 03/19/2022    Years since quitting: 0.5   Smokeless tobacco: Never   Tobacco comments:    Smoking cessation  Vaping Use   Vaping Use: Never used  Substance and Sexual Activity   Alcohol use: Not on file    Comment: socially   Drug use: Yes    Types: Marijuana    Comment: past   Sexual activity: Not on file  Other Topics Concern   Not on file  Social History Narrative   He lives with his daughter apparently and 5 grandchildren.  He reports that he smokes probably less than a pack of cigarettes a day.  He occasionally drinks a beer or liquor but not daily.   Social Determinants of Health   Financial Resource Strain: Low Risk  (03/11/2022)   Overall Financial Resource Strain (CARDIA)    Difficulty of Paying Living Expenses: Not very hard  Food Insecurity: No Food Insecurity (08/14/2022)   Hunger Vital Sign     Worried About Running Out of Food in the Last Year: Never true    Ran Out of Food in the Last Year: Never true  Transportation Needs: No Transportation Needs (08/14/2022)   PRAPARE - 08/16/2022 (Medical): No    Lack of Transportation (Non-Medical): No  Physical Activity: Not on file  Stress: Not on file  Social Connections: Not on file  Intimate Partner Violence: Not At Risk (08/14/2022)   Humiliation, Afraid, Rape, and Kick questionnaire    Fear of Current or Ex-Partner: No    Emotionally Abused: No    Physically Abused: No    Sexually  Abused: No    Physical Exam      Future Appointments  Date Time Provider Department Center  12/31/2022 11:30 AM Marinus Maw, MD [facility]       Beatrix Shipper, EMT-P-Paramedic [phone] [facility] Paramedic  10/07/22

## 2022-10-09 ENCOUNTER — Other Ambulatory Visit: Payer: Self-pay

## 2022-10-09 MED ORDER — SPIRONOLACTONE 25 MG PO TABS: 12.5000 mg | ORAL_TABLET | Freq: Every day | ORAL | 1 refills | Status: DC

## 2022-10-14 ENCOUNTER — Other Ambulatory Visit (HOSPITAL_COMMUNITY): Payer: Self-pay | Admitting: Emergency Medicine

## 2022-10-14 NOTE — Progress Notes (Signed)
Paramedicine Encounter    Patient ID: COBEE MIAO, male    DOB: 07/25/1951, 71 y.o.   MRN: [id number]   BP (!) 140/90 (BP Location: Right Arm, Patient Position: Standing, Cuff Size: Normal)   Pulse 80   Resp 16   Wt 152 lb 12.8 oz (69.3 kg)   SpO2 93%   BMI 25.43 kg/m  Weight yesterday-not taken Last visit weight-155.12lb  ATF Mr. Reber A&O x 4, skin W&D w/ good color.  Pt. Denies chest pain but does have audible wheezes inspiratory and expiratory bilaterally.  He states he has had to increase [place] of his DuoNeb the past couple of days and needs refill for same.  He does have a productive cough with thick white sputum.  I [facility] to [facility] and they will be delivered tomorrow along with [facility] 24-26.  Pt has no edema noted to his extremities.  Med box reconciled x 1 week.  I recommended Mr. Stablein listen to his body and his breathing and to contact 911 should he need assistance.  He advised he did not want EMS and did not want to go to the hospital.  I advised him to listen to his body and recognize increased SOB and/or respiratory effort and he advised he would.  I also suggested making an appointment with his PCP and he said he would consider it.   Renee Ramus, [place] 10/14/2022   Patient Care Team: Cipriano Mile, NP as PCP - Cheral Bay, MD as PCP - Cardiology (Cardiology)  Patient Active Problem List   Diagnosis Date Noted   COPD exacerbation ([place]) 08/13/2022   Chronic HFrEF (heart failure with reduced ejection fraction) ([place]) 08/13/2022   DM2 (diabetes mellitus, type 2) ([place]) 08/13/2022   Obstructive sleep apnea (adult) (pediatric) 08/08/2022   Acute respiratory failure with hypoxia ([place]) 05/04/2022   Third degree heart block ([place]) 04/15/2022   Snoring 04/15/2022   Nonischemic cardiomyopathy ([place]) 04/15/2022   HFrEF (heart failure with reduced ejection fraction) ([place]) 04/15/2022   COPD with acute exacerbation ([place]) 03/22/2022    Acute on chronic combined systolic and diastolic CHF (congestive heart failure) ([place])    Tobacco abuse 03/08/2022   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   HTN (hypertension) 03/08/2022   Stage 3a chronic kidney disease (CKD) ([place]) 03/08/2022   Coronary artery disease 03/08/2022    Current Outpatient Medications:    aspirin EC 81 MG tablet, Take 1 tablet (81 mg total) by mouth daily. Swallow whole., Disp: 90 tablet, Rfl: 3   atorvastatin (LIPITOR) 80 MG tablet, Take 1 tablet (80 mg total) by mouth every evening., Disp: 90 tablet, Rfl: 3   bisoprolol (ZEBETA) 5 MG tablet, TAKE 1 Tablet BY MOUTH ONCE DAILY, Disp: 30 tablet, Rfl: 6   FARXIGA 10 MG TABS tablet, TAKE 1 Tablet BY MOUTH ONCE DAILY, Disp: 30 tablet, Rfl: 5   furosemide (LASIX) 40 MG tablet, TAKE 1 Tablet BY MOUTH TWICE DAILY, Disp: 90 tablet, Rfl: 3   ipratropium-albuterol (DUONEB) 0.5-2.5 (3) MG/3ML SOLN, Inhale 3 mLs into the lungs in the morning, at noon, in the evening, and at bedtime., Disp: 360 mL, Rfl: 11   isosorbide-hydrALAZINE (BIDIL) 20-37.5 MG tablet, Take 1 tablet by mouth 3 (three) times daily., Disp: 90 tablet, Rfl: 3   montelukast (SINGULAIR) 10 MG tablet, Take 10 mg by mouth at bedtime., Disp: , Rfl:    potassium chloride SA (KLOR-CON M) 20 MEQ tablet, Take 1 tablet (20 mEq total) by  mouth daily., Disp: 180 tablet, Rfl: 3   sacubitril-valsartan (ENTRESTO) 24-26 MG, Take 1 tablet by mouth 2 (two) times daily., Disp: 90 tablet, Rfl: 1   spironolactone (ALDACTONE) 25 MG tablet, Take 0.5 tablets (12.5 mg total) by mouth daily., Disp: 30 tablet, Rfl: 1   albuterol (VENTOLIN HFA) 108 (90 Base) MCG/ACT inhaler, Inhale 2 puffs into the lungs every 4 (four) hours as needed for wheezing or shortness of breath. (Patient not taking: Reported on 09/23/2022), Disp: 1 each, Rfl: 11   Budeson-Glycopyrrol-Formoterol (BREZTRI AEROSPHERE) 160-9-4.8 MCG/ACT AERO, Inhale 2 puffs into the lungs as needed. (Patient not taking:  Reported on 09/23/2022), Disp: 10.6 g, Rfl: 11   loratadine (ALLERGY RELIEF) 10 MG tablet, Take 10 mg by mouth in the morning. (Patient not taking: Reported on 09/23/2022), Disp: , Rfl:    metFORMIN (GLUCOPHAGE) 500 MG tablet, Take 1 tablet (500 mg total) by mouth 2 (two) times daily with a meal. (Patient not taking: Reported on 08/19/2022), Disp: 60 tablet, Rfl: 0   nitroGLYCERIN (NITROSTAT) 0.4 MG SL tablet, Place 1 tablet (0.4 mg total) under the tongue every 5 (five) minutes as needed for chest pain. (Patient not taking: Reported on 09/09/2022), Disp: 25 tablet, Rfl: 12   predniSONE (DELTASONE) 20 MG tablet, Take 1-2 tablets (20-40 mg total) by mouth daily. Take 40mg  daily for 2days then 20mg  daily for 2days then STOP (Patient not taking: Reported on 08/26/2022), Disp: 6 tablet, Rfl: 0   SYMBICORT 160-4.5 MCG/ACT inhaler, Inhale 2 puffs into the lungs 2 (two) times daily. (Patient not taking: Reported on 08/26/2022), Disp: , Rfl:  No Known Allergies    Social History   Socioeconomic History   Marital status: Single    Spouse name: Not on file   Number of children: 1   Years of education: Not on file   Highest education level: 10th grade  Occupational History   Occupation: Disability  Tobacco Use   Smoking status: Former    Packs/day: 0.50    Years: 50.00    Total pack years: 25.00    Types: Cigarettes    Quit date: 03/19/2022    Years since quitting: 0.5   Smokeless tobacco: Never   Tobacco comments:    Smoking cessation  Vaping Use   Vaping Use: Never used  Substance and Sexual Activity   Alcohol use: Not on file    Comment: socially   Drug use: Yes    Types: Marijuana    Comment: past   Sexual activity: Not on file  Other Topics Concern   Not on file  Social History Narrative   He lives with his daughter apparently and 5 grandchildren.  He reports that he smokes probably less than a pack of cigarettes a day.  He occasionally drinks a beer or liquor but not daily.   Social  Determinants of Health   Financial Resource Strain: Low Risk  (03/11/2022)   Overall Financial Resource Strain (CARDIA)    Difficulty of Paying Living Expenses: Not very hard  Food Insecurity: No Food Insecurity (08/14/2022)   Hunger Vital Sign    Worried About Running Out of Food in the Last Year: Never true    Ran Out of Food in the Last Year: Never true  Transportation Needs: No Transportation Needs (08/14/2022)   PRAPARE - 08/16/2022 (Medical): No    Lack of Transportation (Non-Medical): No  Physical Activity: Not on file  Stress: Not on file  Social Connections:  Not on file  Intimate Partner Violence: Not At Risk (08/14/2022)   Humiliation, Afraid, Rape, and Kick questionnaire    Fear of Current or Ex-Partner: No    Emotionally Abused: No    Physically Abused: No    Sexually Abused: No    Physical Exam      Future Appointments  Date Time Provider [place]  12/31/2022 11:30 AM Evans Lance, MD [facility] [place], [place] [facility] Paramedic  10/14/22

## 2022-10-16 ENCOUNTER — Other Ambulatory Visit: Payer: Self-pay

## 2022-10-16 ENCOUNTER — Emergency Department (HOSPITAL_COMMUNITY)
Admission: EM | Admit: 2022-10-16 | Discharge: 2022-10-16 | Disposition: A | Discharge status: Home / Self Care | Payer: Medicare Other | Attending: Emergency Medicine | Admitting: Emergency Medicine

## 2022-10-16 ENCOUNTER — Other Ambulatory Visit (HOSPITAL_COMMUNITY): Payer: Self-pay | Admitting: Emergency Medicine

## 2022-10-16 ENCOUNTER — Emergency Department (HOSPITAL_COMMUNITY): Payer: Medicare Other

## 2022-10-16 DIAGNOSIS — F17200 Nicotine dependence, unspecified, uncomplicated: Secondary | ICD-10-CM | POA: Diagnosis not present

## 2022-10-16 DIAGNOSIS — Z20822 Contact with and (suspected) exposure to covid-19: Secondary | ICD-10-CM | POA: Diagnosis not present

## 2022-10-16 DIAGNOSIS — E875 Hyperkalemia: Secondary | ICD-10-CM | POA: Insufficient documentation

## 2022-10-16 DIAGNOSIS — Z7982 Long term (current) use of aspirin: Secondary | ICD-10-CM | POA: Insufficient documentation

## 2022-10-16 DIAGNOSIS — I509 Heart failure, unspecified: Secondary | ICD-10-CM | POA: Diagnosis not present

## 2022-10-16 DIAGNOSIS — E1122 Type 2 diabetes mellitus with diabetic chronic kidney disease: Secondary | ICD-10-CM | POA: Diagnosis not present

## 2022-10-16 DIAGNOSIS — J449 Chronic obstructive pulmonary disease, unspecified: Secondary | ICD-10-CM | POA: Diagnosis not present

## 2022-10-16 DIAGNOSIS — R55 Syncope and collapse: Secondary | ICD-10-CM | POA: Diagnosis present

## 2022-10-16 DIAGNOSIS — I130 Hypertensive heart and chronic kidney disease with heart failure and stage 1 through stage 4 chronic kidney disease, or unspecified chronic kidney disease: Secondary | ICD-10-CM | POA: Diagnosis not present

## 2022-10-16 DIAGNOSIS — N189 Chronic kidney disease, unspecified: Secondary | ICD-10-CM | POA: Diagnosis not present

## 2022-10-16 DIAGNOSIS — I2510 Atherosclerotic heart disease of native coronary artery without angina pectoris: Secondary | ICD-10-CM | POA: Insufficient documentation

## 2022-10-16 LAB — CBC WITH DIFFERENTIAL/PLATELET
Abs Immature Granulocytes: 0.03 10*3/uL (ref 0.00–0.07)
Basophils Absolute: 0.1 10*3/uL (ref 0.0–0.1)
Basophils Relative: 1 %
Eosinophils Absolute: 1.1 10*3/uL — ABNORMAL HIGH (ref 0.0–0.5)
Eosinophils Relative: 19 %
HCT: 48 % (ref 39.0–52.0)
Hemoglobin: 15.4 g/dL (ref 13.0–17.0)
Immature Granulocytes: 1 %
Lymphocytes Relative: 21 %
Lymphs Abs: 1.3 10*3/uL (ref 0.7–4.0)
MCH: 30.1 pg (ref 26.0–34.0)
MCHC: 32.1 g/dL (ref 30.0–36.0)
MCV: 93.8 fL (ref 80.0–100.0)
Monocytes Absolute: 0.5 10*3/uL (ref 0.1–1.0)
Monocytes Relative: 9 %
Neutro Abs: 3 10*3/uL (ref 1.7–7.7)
Neutrophils Relative %: 49 %
Platelets: 210 10*3/uL (ref 150–400)
RBC: 5.12 MIL/uL (ref 4.22–5.81)
RDW: 13.2 % (ref 11.5–15.5)
WBC: 6.1 10*3/uL (ref 4.0–10.5)
nRBC: 0 % (ref 0.0–0.2)

## 2022-10-16 LAB — BASIC METABOLIC PANEL
Anion gap: 6 (ref 5–15)
BUN: 19 mg/dL (ref 8–23)
CO2: 29 mmol/L (ref 22–32)
Calcium: 8.8 mg/dL — ABNORMAL LOW (ref 8.9–10.3)
Chloride: 97 mmol/L — ABNORMAL LOW (ref 98–111)
Creatinine, Ser: 2.05 mg/dL — ABNORMAL HIGH (ref 0.61–1.24)
GFR, Estimated: 34 mL/min — ABNORMAL LOW (ref >60)
Glucose, Bld: 96 mg/dL (ref 70–99)
Potassium: 5.5 mmol/L — ABNORMAL HIGH (ref 3.5–5.1)
Sodium: 132 mmol/L — ABNORMAL LOW (ref 135–145)

## 2022-10-16 LAB — BRAIN NATRIURETIC PEPTIDE: B Natriuretic Peptide: 171.6 pg/mL — ABNORMAL HIGH (ref 0.0–100.0)

## 2022-10-16 LAB — RESP PANEL BY RT-PCR (RSV, FLU A&B, COVID)  RVPGX2
Influenza A by PCR: NEGATIVE
Influenza B by PCR: NEGATIVE
Resp Syncytial Virus by PCR: NEGATIVE
SARS Coronavirus 2 by RT PCR: NEGATIVE

## 2022-10-16 MED ORDER — FUROSEMIDE 10 MG/ML IJ SOLN
40.0000 mg | Freq: Once | INTRAMUSCULAR | Status: AC
  Administered 2022-10-16: 40 mg via INTRAVENOUS
  Filled 2022-10-16: qty 4

## 2022-10-16 MED ORDER — SODIUM CHLORIDE 0.9 % IV BOLUS
500.0000 mL | Freq: Once | INTRAVENOUS | Status: AC
  Administered 2022-10-16: 500 mL via INTRAVENOUS

## 2022-10-16 MED ORDER — SODIUM ZIRCONIUM CYCLOSILICATE 10 G PO PACK
10.0000 g | PACK | Freq: Once | ORAL | Status: AC
  Administered 2022-10-16: 10 g via ORAL
  Filled 2022-10-16: qty 1

## 2022-10-16 MED ORDER — ALBUTEROL SULFATE (2.5 MG/3ML) 0.083% IN NEBU
2.5000 mg | INHALATION_SOLUTION | Freq: Once | RESPIRATORY_TRACT | Status: AC
  Administered 2022-10-16: 2.5 mg via RESPIRATORY_TRACT
  Filled 2022-10-16: qty 3

## 2022-10-16 NOTE — Progress Notes (Signed)
This was an unscheduled visit.  EMS was called to Mr. Ator for a syncopal episode witnessed by his daughter.  Paramedic Amy Laural Benes arrived on scene and called me as family told her that I did home visits for [name].   On my arrival, pt was A&O x 4, skin W&D w/ good color.  Pt attempted to down-play his syncope however this is the 2nd episode he has had this month.  EMS had come earlier in December and he refused transport that that time. I did a home visit w/ med rec on 10/14/22 at which time I did express concern to him regarding the quality and effort of his breathing.  At that time I recommended he seek further evaluation either with his PCP or urgent care facility which he refused.   Today I expressed further concern to him due to his history of low EF and having recently discontinued wearing a Life Vest at the doctor's approval.  He was hospitalized back on October 26th for COPD exacerbation. Mr Allemand finally conceded to transport by ambulance to [facility] for further evaluation/treatment.    Beatrix Shipper, EMT-Paramedic [phone] 10/16/2022

## 2022-10-16 NOTE — ED Provider Notes (Signed)
[facility] EMERGENCY DEPARTMENT Provider Note   CSN: [id number] Arrival date & time: 10/16/22  1725     History  Chief Complaint  Patient presents with   Loss of Consciousness    Marcus Tran is a 71 y.o. male with a history of COPD, not on home oxygen, CAD, congestive heart failure, smoker, type II diabetic, chronic kidney disease, hypertension, presenting to the ED with wheezing and near syncope.  History is provided by the patient, EMS, as well as the patient's daughter.  The patient's daughter reports that she was leaning on the porch talking to her father and he had been "coughing like crazy all week" and during a coughing spell, the patient just leaned over on her, and his eyes rolled up, for a few seconds he was not responding.  After that he seemed dazed and was able to talk to her.  He has a similar episode of nearly passing out a week ago during a coughing spell.  The patient reports he is currently asymptomatic.  EMS reports he had some hypoxia on arrival which improved with supplemental oxygen.  The patient says that he does have COPD and has been using breathing treatments this week.  Medical chart reviewed, patient was hospitalized in October 2023, noted to have an EF of 35% with his heart failure, was also requiring BiPAP, steroids, DuoNebs for acute hypoxic respiratory failure secondary to COPD exacerbation. HPI     Home Medications Prior to Admission medications   Medication Sig Start Date End Date Taking? Authorizing Provider  albuterol (VENTOLIN HFA) 108 (90 Base) MCG/ACT inhaler Inhale 2 puffs into the lungs every 4 (four) hours as needed for wheezing or shortness of breath. Patient not taking: Reported on 09/23/2022 04/06/22   Hunsucker, Lesia Sago, MD  aspirin EC 81 MG tablet Take 1 tablet (81 mg total) by mouth daily. Swallow whole. 03/24/22   Alver Sorrow, NP  atorvastatin (LIPITOR) 80 MG tablet Take 1 tablet (80 mg total) by mouth every  evening. 03/24/22   Alver Sorrow, NP  bisoprolol (ZEBETA) 5 MG tablet TAKE 1 Tablet BY MOUTH ONCE DAILY 08/28/22   Rollene Rotunda, MD  Budeson-Glycopyrrol-Formoterol (BREZTRI AEROSPHERE) 160-9-4.8 MCG/ACT AERO Inhale 2 puffs into the lungs as needed. Patient not taking: Reported on 09/23/2022 05/21/22   Hunsucker, Lesia Sago, MD  FARXIGA 10 MG TABS tablet TAKE 1 Tablet BY MOUTH ONCE DAILY 08/26/22   Alver Sorrow, NP  furosemide (LASIX) 40 MG tablet TAKE 1 Tablet BY MOUTH TWICE DAILY 09/25/22   Rollene Rotunda, MD  ipratropium-albuterol (DUONEB) 0.5-2.5 (3) MG/3ML SOLN Inhale 3 mLs into the lungs in the morning, at noon, in the evening, and at bedtime. 05/21/22   Hunsucker, Lesia Sago, MD  isosorbide-hydrALAZINE (BIDIL) 20-37.5 MG tablet Take 1 tablet by mouth 3 (three) times daily. 03/24/22   Alver Sorrow, NP  loratadine (ALLERGY RELIEF) 10 MG tablet Take 10 mg by mouth in the morning. Patient not taking: Reported on 09/23/2022    [provider]  metFORMIN (GLUCOPHAGE) 500 MG tablet Take 1 tablet (500 mg total) by mouth 2 (two) times daily with a meal. Patient not taking: Reported on 08/19/2022 05/07/22   Leroy Sea, MD  montelukast (SINGULAIR) 10 MG tablet Take 10 mg by mouth at bedtime.    [provider]  nitroGLYCERIN (NITROSTAT) 0.4 MG SL tablet Place 1 tablet (0.4 mg total) under the tongue every 5 (five) minutes as needed for chest pain.  Patient not taking: Reported on 09/09/2022 03/11/22   Cipriano Bunker, MD  potassium chloride SA (KLOR-CON M) 20 MEQ tablet Take 1 tablet (20 mEq total) by mouth daily. 07/09/22   Robbie Lis M, PA-C  predniSONE (DELTASONE) 20 MG tablet Take 1-2 tablets (20-40 mg total) by mouth daily. Take 40mg  daily for 2days then 20mg  daily for 2days then STOP Patient not taking: Reported on 08/26/2022 08/17/22   13/05/2022, MD  sacubitril-valsartan (ENTRESTO) 24-26 MG Take 1 tablet by mouth 2 (two) times daily. 09/25/22   Zannie Cove,  MD  spironolactone (ALDACTONE) 25 MG tablet Take 0.5 tablets (12.5 mg total) by mouth daily. 10/09/22   Rollene Rotunda, MD  SYMBICORT 160-4.5 MCG/ACT inhaler Inhale 2 puffs into the lungs 2 (two) times daily. Patient not taking: Reported on 08/26/2022    [provider]      Allergies    Patient has no known allergies.    Review of Systems   Review of Systems  Physical Exam Updated Vital Signs BP (!) 157/102   Pulse 82   Temp 98.4 F (36.9 C) (Oral)   Resp (!) 26   Ht 5' 5.5" (1.664 m)   Wt 69.3 kg   SpO2 97%   BMI 25.04 kg/m  Physical Exam Constitutional:      General: He is not in acute distress. HENT:     Head: Normocephalic and atraumatic.  Eyes:     Conjunctiva/sclera: Conjunctivae normal.     Pupils: Pupils are equal, round, and reactive to light.  Cardiovascular:     Rate and Rhythm: Normal rate and regular rhythm.  Pulmonary:     Effort: Pulmonary effort is normal. No respiratory distress.     Comments: Vigorous coughing, expiratory wheezing, speaking in full sentences Abdominal:     General: There is no distension.     Tenderness: There is no abdominal tenderness.  Skin:    General: Skin is warm and dry.  Neurological:     General: No focal deficit present.     Mental Status: He is alert. Mental status is at baseline.  Psychiatric:        Mood and Affect: Mood normal.        Behavior: Behavior normal.     ED Results / Procedures / Treatments   Labs (all labs ordered are listed, but only abnormal results are displayed) Labs Reviewed  BASIC METABOLIC PANEL - Abnormal; Notable for the following components:      Result Value   Sodium 132 (*)    Potassium 5.5 (*)    Chloride 97 (*)    Creatinine, Ser 2.05 (*)    Calcium 8.8 (*)    GFR, Estimated 34 (*)    All other components within normal limits  CBC WITH DIFFERENTIAL/PLATELET - Abnormal; Notable for the following components:   Eosinophils Absolute 1.1 (*)    All other components within  normal limits  BRAIN NATRIURETIC PEPTIDE - Abnormal; Notable for the following components:   B Natriuretic Peptide 171.6 (*)    All other components within normal limits  RESP PANEL BY RT-PCR (RSV, FLU A&B, COVID)  [id number]    EKG EKG Interpretation  Date/Time:  Friday October 16 2022 17:32:52 EST Ventricular Rate:  80 PR Interval:  157 QRS Duration: 129 QT Interval:  431 QTC Calculation: 498 R Axis:   -25 Text Interpretation: Sinus rhythm Right bundle branch block Anteroseptal infarct, old Abnormal T waves, noted on prior tracing No sig changes from  Aug 13 2022 tracing Confirmed by Alvester Chou [id number]) on 10/16/2022 5:35:21 PM  Radiology DG Chest 2 View  Result Date: 10/16/2022 CLINICAL DATA:  Shortness of breath. EXAM: CHEST - 2 VIEW COMPARISON:  August 14, 2022 FINDINGS: The heart size and mediastinal contours are within normal limits. Both lungs are clear. The visualized skeletal structures are unremarkable. IMPRESSION: No active cardiopulmonary disease. Electronically Signed   By: Aram Candela M.D.   On: 10/16/2022 19:17    Procedures Procedures    Medications Ordered in ED Medications  albuterol (PROVENTIL) (2.5 MG/3ML) 0.083% nebulizer solution 2.5 mg (2.5 mg Nebulization Given 10/16/22 1753)  sodium chloride 0.9 % bolus 500 mL (0 mLs Intravenous Stopped 10/16/22 2206)  sodium zirconium cyclosilicate (LOKELMA) packet 10 g (10 g Oral Given 10/16/22 1937)  furosemide (LASIX) injection 40 mg (40 mg Intravenous Given 10/16/22 1938)    ED Course/ Medical Decision Making/ A&P Clinical Course as of 10/16/22 2322  Fri Oct 16, 2022  2219 Patient is assessed and feeling asymptomatic.  He was weaned off the oxygen completely while was in the room speaking to him, maintain his oxygen saturation 92 to 95%.  He is a chronic lifelong smoker I suspect this is close to his baseline.  He is wanting to go home and I think this is reasonable.  We did discuss his hyperkalemia and  the need for follow-up on this.  He will need to have one of his doctors recheck his level early next week and study will do so.  In the meantime advised to continue taking his Lasix, which will help with his high potassium at home.  I also advised to continue drinking water, not juice. [MT]    Clinical Course User Index [MT] Kristina Mcnorton, Kermit Balo, MD                           Medical Decision Making Amount and/or Complexity of Data Reviewed Labs: ordered. Radiology: ordered.  Risk Prescription drug management.   This patient presents to the ED with concern for coughing spell, near syncope. This involves an extensive number of treatment options, and is a complaint that carries with it a high risk of complications and morbidity.  The differential diagnosis includes likely vasovagal episode in the setting of persistent coughing fit.  His coughing may be related to a COPD exacerbation versus pneumonia versus a viral illness versus other  Co-morbidities that complicate the patient evaluation: COPD and hospitalization for COPD  Additional history obtained from EMS, patient's daughter by phone  External records from outside source obtained and reviewed including most hospitalization course as noted above  The patient did have a cardiac monitor in the past that showed some brief transient episodes of third-degree heart block.  This does remain in the differential was considered, however in this specific clinical setting, that he had near syncope in the setting of a vigorous coughing fit, I strongly suspect this was vasovagal.  Even now as he is coughing vigorously in the room I noticed that he nearly performing valsalva.  I ordered and personally interpreted labs.  The pertinent results include: Mild hyperkalemia and very mild worsening kidney disease.  Labs are otherwise close to baseline levels  I ordered imaging studies including x-ray of the chest I independently visualized and interpreted  imaging which showed no acute abnormalities I agree with the radiologist interpretation  The patient was maintained on a cardiac monitor.  I personally viewed and  interpreted the cardiac monitored which showed an underlying rhythm of: Sinus rhythm  Per my interpretation the patient's ECG shows sinus rhythm with T wave inversions that are unchanged from prior EKGs, appear to be chronic.  I ordered medication including duonebs for COPD  I have reviewed the patients home medicines and have made adjustments as needed  Test Considered: Low suspicion for acute PE in this clinical setting   After the interventions noted above, I reevaluated the patient and found that they have: improved  Social Determinants of Health: counseled patient once again about the importance of smoking cessation, which is likely driving his COPD and chronic cough.   Dispostion:  After consideration of the diagnostic results and the patients response to treatment, I feel that the patent would benefit from outpatient follow-up, close outpatient follow-up for potassium recheck.  Also advised Mucinex to help thin his secretions.  I strongly suspect that his episode today of near syncope was vasovagal episode during a coughing fit.  Lower suspicion for arrhythmia, PE other life-threatening event.  His telemetry which I reviewed at time of discharge did not show any high-grade heart block or ventricular tachycardia or concerning arrhythmia.         Final Clinical Impression(s) / ED Diagnoses Final diagnoses:  Hyperkalemia  Near syncope    Rx / DC Orders ED Discharge Orders     None         Ayva Veilleux, Kermit Balo, MD 10/16/22 [phone]

## 2022-10-16 NOTE — ED Triage Notes (Signed)
Pt bib [facility], pt had a syncope episode about a week ago, same thing happen today and daughter stated he did pass out. This happened during a coughing spell, Sat drops to 85% while coughing   HX of CHF No pain  Lung sounds decreased everywhere 95 O2 2L 85 HR

## 2022-10-16 NOTE — Discharge Instructions (Addendum)
Your workup in the ER today showed that your potassium level was abnormally high.  It is very important you follow-up on this and have your levels rechecked early next week if possible.  Please call your primary care doctor's office to request a repeat blood test early next week for your kidneys and potassium level.  Very high potassium can lead to serious heart conditions and even death.  We gave you medications in the ER to help with your potassium, including Lasix and fluids.  Please continue drinking water at home.  And please continue taking the Lasix (furosemide) that was prescribed when you left the hospital.  This is a water pill that will help you urinate and also lower your potassium.

## 2022-10-21 ENCOUNTER — Telehealth (HOSPITAL_COMMUNITY): Payer: Self-pay | Admitting: Licensed Clinical Social Worker

## 2022-10-21 ENCOUNTER — Other Ambulatory Visit (HOSPITAL_COMMUNITY): Payer: Self-pay | Admitting: Emergency Medicine

## 2022-10-21 NOTE — Progress Notes (Signed)
Paramedicine Encounter    Patient ID: Marcus Tran, male    DOB: November 15, 1950, 72 y.o.   MRN: [id number]   BP 120/80 (BP Location: Left Arm, Patient Position: Sitting, Cuff Size: Normal)   Pulse 64   Wt 148 lb 9.6 oz (67.4 kg)   SpO2 94%   BMI 24.35 kg/m  Weight yesterday-not taken Last visit weight-152lb  [name] A&O x 4, skin W&D w/ good color.  Pt denies chest pain or SOB.  Lung sounds clear in the bases and some mild expiratory wheezing noted in the upper lungs.  Pt recently seen in ED w/ Hyperkalemia.  Given Lokelma and discharged.  I scheduled a follow-up Labs and officce visit at the HF Clinic 10/26/22 @ 9:30.  Marcus Tran scheduled transportation via [employer] to be p/u @ 8:45.   Meds reconciled x 1 week.  During the visit pt's daughter advises Marcus Tran had another syncopal episode since coming home from the hospital.  These episodes seem to occur while pt is having a coughing spell.  I will call him on Friday to remind him of Monday's appointment.  Home visit complete.    Marcus Tran, [place] 10/21/2022   Patient Care Team: Marcus Mile, NP as PCP - Marcus Bay, MD as PCP - Cardiology (Cardiology)  Patient Active Problem List   Diagnosis Date Noted   COPD exacerbation ([place]) 08/13/2022   Chronic HFrEF (heart failure with reduced ejection fraction) ([place]) 08/13/2022   DM2 (diabetes mellitus, type 2) ([place]) 08/13/2022   Obstructive sleep apnea (adult) (pediatric) 08/08/2022   Acute respiratory failure with hypoxia ([place]) 05/04/2022   Third degree heart block ([place]) 04/15/2022   Snoring 04/15/2022   Nonischemic cardiomyopathy ([place]) 04/15/2022   HFrEF (heart failure with reduced ejection fraction) ([place]) 04/15/2022   COPD with acute exacerbation ([place]) 03/22/2022   Acute on chronic combined systolic and diastolic CHF (congestive heart failure) ([place])    Tobacco abuse 03/08/2022   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   HTN (hypertension)  03/08/2022   Stage 3a chronic kidney disease (CKD) ([place]) 03/08/2022   Coronary artery disease 03/08/2022    Current Outpatient Medications:    albuterol (VENTOLIN HFA) 108 (90 Base) MCG/ACT inhaler, Inhale 2 puffs into the lungs every 4 (four) hours as needed for wheezing or shortness of breath., Disp: 1 each, Rfl: 11   aspirin EC 81 MG tablet, Take 1 tablet (81 mg total) by mouth daily. Swallow whole., Disp: 90 tablet, Rfl: 3   atorvastatin (LIPITOR) 80 MG tablet, Take 1 tablet (80 mg total) by mouth every evening., Disp: 90 tablet, Rfl: 3   bisoprolol (ZEBETA) 5 MG tablet, TAKE 1 Tablet BY MOUTH ONCE DAILY, Disp: 30 tablet, Rfl: 6   FARXIGA 10 MG TABS tablet, TAKE 1 Tablet BY MOUTH ONCE DAILY, Disp: 30 tablet, Rfl: 5   furosemide (LASIX) 40 MG tablet, TAKE 1 Tablet BY MOUTH TWICE DAILY, Disp: 90 tablet, Rfl: 3   ipratropium-albuterol (DUONEB) 0.5-2.5 (3) MG/3ML SOLN, Inhale 3 mLs into the lungs in the morning, at noon, in the evening, and at bedtime., Disp: 360 mL, Rfl: 11   isosorbide-hydrALAZINE (BIDIL) 20-37.5 MG tablet, Take 1 tablet by mouth 3 (three) times daily., Disp: 90 tablet, Rfl: 3   montelukast (SINGULAIR) 10 MG tablet, Take 10 mg by mouth at bedtime., Disp: , Rfl:    potassium chloride SA (KLOR-CON M) 20 MEQ tablet, Take 1 tablet (20 mEq total) by mouth daily., Disp: 180 tablet,  Rfl: 3   sacubitril-valsartan (ENTRESTO) 24-26 MG, Take 1 tablet by mouth 2 (two) times daily., Disp: 90 tablet, Rfl: 1   spironolactone (ALDACTONE) 25 MG tablet, Take 0.5 tablets (12.5 mg total) by mouth daily., Disp: 30 tablet, Rfl: 1   Budeson-Glycopyrrol-Formoterol (BREZTRI AEROSPHERE) 160-9-4.8 MCG/ACT AERO, Inhale 2 puffs into the lungs as needed. (Patient not taking: Reported on 09/23/2022), Disp: 10.6 g, Rfl: 11   loratadine (ALLERGY RELIEF) 10 MG tablet, Take 10 mg by mouth in the morning. (Patient not taking: Reported on 09/23/2022), Disp: , Rfl:    metFORMIN (GLUCOPHAGE) 500 MG tablet, Take 1  tablet (500 mg total) by mouth 2 (two) times daily with a meal. (Patient not taking: Reported on 08/19/2022), Disp: 60 tablet, Rfl: 0   nitroGLYCERIN (NITROSTAT) 0.4 MG SL tablet, Place 1 tablet (0.4 mg total) under the tongue every 5 (five) minutes as needed for chest pain. (Patient not taking: Reported on 09/09/2022), Disp: 25 tablet, Rfl: 12   predniSONE (DELTASONE) 20 MG tablet, Take 1-2 tablets (20-40 mg total) by mouth daily. Take 40mg  daily for 2days then 20mg  daily for 2days then STOP (Patient not taking: Reported on 08/26/2022), Disp: 6 tablet, Rfl: 0   SYMBICORT 160-4.5 MCG/ACT inhaler, Inhale 2 puffs into the lungs 2 (two) times daily. (Patient not taking: Reported on 08/26/2022), Disp: , Rfl:  No Known Allergies    Social History   Socioeconomic History   Marital status: Single    Spouse name: Not on file   Number of children: 1   Years of education: Not on file   Highest education level: 10th grade  Occupational History   Occupation: Disability  Tobacco Use   Smoking status: Former    Packs/day: 0.50    Years: 50.00    Total pack years: 25.00    Types: Cigarettes    Quit date: 03/19/2022    Years since quitting: 0.5   Smokeless tobacco: Never   Tobacco comments:    Smoking cessation  Vaping Use   Vaping Use: Never used  Substance and Sexual Activity   Alcohol use: Not on file    Comment: socially   Drug use: Yes    Types: Marijuana    Comment: past   Sexual activity: Not on file  Other Topics Concern   Not on file  Social History Narrative   He lives with his daughter apparently and 5 grandchildren.  He reports that he smokes probably less than a pack of cigarettes a day.  He occasionally drinks a beer or liquor but not daily.   Social Determinants of Health   Financial Resource Strain: Low Risk  (03/11/2022)   Overall Financial Resource Strain (CARDIA)    Difficulty of Paying Living Expenses: Not very hard  Food Insecurity: No Food Insecurity (08/14/2022)    Hunger Vital Sign    Worried About Running Out of Food in the Last Year: Never true    Ran Out of Food in the Last Year: Never true  Transportation Needs: Unmet Transportation Needs (10/21/2022)   PRAPARE - 08/16/2022 (Medical): Yes    Lack of Transportation (Non-Medical): Yes  Physical Activity: Not on file  Stress: Not on file  Social Connections: Not on file  Intimate Partner Violence: Not At Risk (08/14/2022)   Humiliation, Afraid, Rape, and Kick questionnaire    Fear of Current or Ex-Partner: No    Emotionally Abused: No    Physically Abused: No    Sexually  Abused: No    Physical Exam      Future Appointments  Date Time Provider [place]  10/22/2022 11:45 AM [facility] LAB [facility] None  10/26/2022  9:30 AM [facility] PA/NP [facility] None  12/31/2022 11:30 AM Evans Lance, MD [facility]       Marcus Tran, [place] [facility] Paramedic  10/21/22

## 2022-10-21 NOTE — Telephone Encounter (Signed)
H&V Care Navigation CSW Progress Note  Clinical Social Worker consulted to help with transportation to upcoming appt next week.  CSW arranged [employer] to come to appt.      SDOH Screenings   Food Insecurity: No Food Insecurity (08/14/2022)  Housing: Low Risk  (08/14/2022)  Transportation Needs: No Transportation Needs (08/14/2022)  Utilities: Not At Risk (08/14/2022)  Alcohol Screen: Low Risk  (05/06/2022)  Financial Resource Strain: Low Risk  (03/11/2022)  Tobacco Use: Medium Risk (08/14/2022)    Jorge Ny, [facility] Desk#: [phone] Cell#: [phone]

## 2022-10-22 ENCOUNTER — Other Ambulatory Visit (HOSPITAL_COMMUNITY): Payer: Medicare Other

## 2022-10-23 ENCOUNTER — Telehealth (HOSPITAL_COMMUNITY): Payer: Self-pay | Admitting: Emergency Medicine

## 2022-10-23 NOTE — Telephone Encounter (Signed)
Called and spoke with [name] daughter Marcus Tran to remind her of her dad's appointment on Monday.  [employer] [facility] will pick him up at 8:45 for his 9:30 appointment.    Renee Ramus, [place] 10/23/2022

## 2022-10-26 ENCOUNTER — Ambulatory Visit (HOSPITAL_COMMUNITY)
Admission: RE | Admit: 2022-10-26 | Discharge: 2022-10-26 | Disposition: A | Discharge status: Home / Self Care | Payer: Medicare Other | Source: Ambulatory Visit | Attending: Family Medicine | Admitting: Family Medicine

## 2022-10-26 ENCOUNTER — Telehealth (HOSPITAL_COMMUNITY): Payer: Self-pay | Admitting: Licensed Clinical Social Worker

## 2022-10-26 VITALS — BP 140/82 | HR 68 | Wt 156.0 lb

## 2022-10-26 DIAGNOSIS — G4733 Obstructive sleep apnea (adult) (pediatric): Secondary | ICD-10-CM | POA: Insufficient documentation

## 2022-10-26 DIAGNOSIS — I5020 Unspecified systolic (congestive) heart failure: Secondary | ICD-10-CM

## 2022-10-26 DIAGNOSIS — Z5982 Transportation insecurity: Secondary | ICD-10-CM | POA: Diagnosis not present

## 2022-10-26 DIAGNOSIS — I2510 Atherosclerotic heart disease of native coronary artery without angina pectoris: Secondary | ICD-10-CM | POA: Insufficient documentation

## 2022-10-26 DIAGNOSIS — R55 Syncope and collapse: Secondary | ICD-10-CM

## 2022-10-26 DIAGNOSIS — R0603 Acute respiratory distress: Secondary | ICD-10-CM | POA: Insufficient documentation

## 2022-10-26 DIAGNOSIS — E785 Hyperlipidemia, unspecified: Secondary | ICD-10-CM | POA: Insufficient documentation

## 2022-10-26 DIAGNOSIS — I5022 Chronic systolic (congestive) heart failure: Secondary | ICD-10-CM | POA: Diagnosis not present

## 2022-10-26 DIAGNOSIS — E119 Type 2 diabetes mellitus without complications: Secondary | ICD-10-CM | POA: Diagnosis not present

## 2022-10-26 DIAGNOSIS — I428 Other cardiomyopathies: Secondary | ICD-10-CM | POA: Diagnosis not present

## 2022-10-26 DIAGNOSIS — Z7982 Long term (current) use of aspirin: Secondary | ICD-10-CM | POA: Insufficient documentation

## 2022-10-26 DIAGNOSIS — Z79899 Other long term (current) drug therapy: Secondary | ICD-10-CM | POA: Diagnosis not present

## 2022-10-26 DIAGNOSIS — I110 Hypertensive heart disease with heart failure: Secondary | ICD-10-CM | POA: Insufficient documentation

## 2022-10-26 DIAGNOSIS — R0609 Other forms of dyspnea: Secondary | ICD-10-CM

## 2022-10-26 DIAGNOSIS — Z720 Tobacco use: Secondary | ICD-10-CM

## 2022-10-26 LAB — BASIC METABOLIC PANEL
Anion gap: 11 (ref 5–15)
BUN: 32 mg/dL — ABNORMAL HIGH (ref 8–23)
CO2: 24 mmol/L (ref 22–32)
Calcium: 9.1 mg/dL (ref 8.9–10.3)
Chloride: 96 mmol/L — ABNORMAL LOW (ref 98–111)
Creatinine, Ser: 1.58 mg/dL — ABNORMAL HIGH (ref 0.61–1.24)
GFR, Estimated: 46 mL/min — ABNORMAL LOW (ref >60)
Glucose, Bld: 97 mg/dL (ref 70–99)
Potassium: 4 mmol/L (ref 3.5–5.1)
Sodium: 131 mmol/L — ABNORMAL LOW (ref 135–145)

## 2022-10-26 NOTE — Progress Notes (Addendum)
Updated SDOH needs. Utilized [facility] food pantry. Form filled out by patient, foods given.  Arranged transportation home.   CSW notified of needs. Pt seen by Paramedicine program.     PCP office called regarding refill needs for non-cardiac medications (loratadine, metformin, symbicort).

## 2022-10-26 NOTE — Telephone Encounter (Signed)
Referral for CCM services. Raquel Sarna, [place], [place]

## 2022-10-26 NOTE — Progress Notes (Signed)
H&V Care Navigation CSW Progress Note  Clinical Social Worker contacted caregiver by phone to provide resources and discuss Community Paramedic visit tomorrow.  Patient is participating in a Managed Medicaid Plan:  [facility] Medicare and Medicaid  Patient resides with his daughter and grandson. Daughter reports they have some food insecurity despite daughter receiving food stamps. CSW provided number for [employer] for the food stamp application assistance for patient to apply. Daughter reports she is aware of the Medicaid transportation assistance with medicaid and will call the county to get her father registered for transportation services. CSW will explore referral to Chronic Care Management for added support services at home. Daughter grateful for the support and assistance. Raquel Sarna, [place], [place]   [place]: Food Insecurity Present (10/26/2022)  Housing: Low Risk  (08/14/2022)  Transportation Needs: Unmet Transportation Needs (10/26/2022)  Utilities: Not At Risk (08/14/2022)  Alcohol Screen: Low Risk  (05/06/2022)  Financial Resource Strain: Low Risk  (03/11/2022)  Tobacco Use: Medium Risk (08/14/2022)

## 2022-10-26 NOTE — Patient Instructions (Signed)
Medication Changes:  STOP spironolactone.  STOP Potassium.   Lab Work:  Labs done today, we will contact you for abnormal readings.  Special Instructions // Education:  Do the following things EVERYDAY: Weigh yourself in the morning before breakfast. Write it down and keep it in a log. Take your medicines as prescribed Eat low salt foods--Limit salt (sodium) to 2000 mg per day.  Stay as active as you can everyday Limit all fluids for the day to less than 2 liters  Follow-Up in: 4 weeks with APP clinic.     At the [facility], you and your health needs are our priority. We have a designated team specialized in the treatment of Heart Failure. This Care Team includes your primary Heart Failure Specialized Cardiologist (physician), Advanced Practice Providers (APPs- Physician Assistants and Nurse Practitioners), and Pharmacist who all work together to provide you with the care you need, when you need it.   You may see any of the following providers on your designated Care Team at your next follow up:  Dr. Glori Bickers Dr. Loralie Champagne Dr. Roxana Hires, NP Lyda Jester, [place] [facility] [place], [place] Forestine Na, NP Audry Riles, PharmD   Please be sure to bring in all your medications bottles to every appointment.   Need to Contact us:  If you have any questions or concerns before your next appointment please send [place] a message through [place] or call our office at [phone].    TO LEAVE A MESSAGE FOR THE NURSE SELECT OPTION 2, PLEASE LEAVE A MESSAGE INCLUDING: YOUR NAME DATE OF BIRTH CALL BACK NUMBER REASON FOR CALL**this is important as we prioritize the call backs  YOU WILL RECEIVE A CALL BACK THE SAME DAY AS LONG AS YOU CALL BEFORE 4:00 PM

## 2022-10-26 NOTE — Progress Notes (Signed)
ADVANCED HF CLINIC NOTE  Primary Care: Cipriano Mile, NP [facility] PCP  Primary Cardiologist: Minus Breeding, MD  [facility]: Assigned to Dr. Aundra Dubin   HPI: 72 y.o. [name] with hx tobacco use, COPD, OSA on Bipap, HTN,  chronic HFmEF.   Admitted 03/06/2022 with acute hypoxic respiratory failure secondary to suspected COPD and acute systolic CHF.  Initially in respiratory distress and required BiPAP. BP significantly elevated at 189/133, remained elevated throughout much of admission but improved with titration of GDMT. CTA chest negative for PE. Echo EF 35%, RV okay, RVSP 54 mmhg. R/LHC: nonobstructive CAD, RA mean 12, PA 54/29 (37), PCWP mean 23, LVEDP 22 mmHg, Fick CO/CI 3.84/2.12. He diuresed with IV lasix. Discharged home on 03/11/22, weight 05/23 163 lb.    He was readmitted 06/02-06/04/23 with acute on chronic respiratory failure with hypoxia secondary to acute COPD exacerbation. Also thought to possibly have some component of a/c CHF. He diuresed with IV lasix and received doxycycline + prednisone. Discharge weight 151 lb.    Seen in [facility] clinic on 03/23/2022 . Volume looked good. Added entresto and cut back lasix. He was referred back to Cardiology. 14 day zio ordered at cardiology f/u - rhythm was sinus with short runs NSVT and SVT, bradyarrhythmias, idioventricular rhythm, with episodes of third-degree HB noted during sleep hours.   Saw Dr. Percival Spanish for f/u 04/16/22. Bradycardia thought to be possibly d/t untreated OSA. Sleep evaluation recommended before considering a device. Sleep study ordered. He was having more shortness of breath and cough. Volume looked good. He was referred to pulmonary and was treated the same day for suspected COPD exacerbation. Pulmonary ordered PFTs.   He was admitted 05/04/22 with acute respiratory failure with hypoxia. O2 sats 80s. He initially required BiPAP. Hypertensive with SBP 210/120 and placed on nitro gtt. He was admitted for AECOPD and a/c CHF.  Diuresed  with IV lasix. Cardiology consulted and felt presentation more likely consistent with AECOPD rather than CHF. Had runs of NSVT up to 20 beats in duration.    Referred to [facility] clinic for post hospital. Was fitted for LifeVest and GDMT adjusted. Referred to the AHF clinic for further management.   05/2022 EF improved 40-45%  with LVH. Recommended PYP/ CMRI  ED evaluation 10/16/22 for syncope. Low suspicion for PE. CXR ok. COVID negative. K 5.5. Suspected vasovagal episode during coughing fit. He instructed to take OTC for URI.   Today he returns for HF follow up.Overall feeling better. Denies  syncope. Having some shortness of breath with exertion but seems to be his baseline. Denies PND/Orthopnea. Ongoing productive cough. Appetite ok. He has difficulty paying  No fever or chills. Smoking 5-6 cigarettes per day.  He is unsure about his medications. Followed by HF Paramedicine. medication box is filled weekly.   He previously worked jobs with [employer]. Still does occasional landscaping jobs. Has difficulty with transportation. His daughter lives with him and his grandchildren.    Past Medical History:  Diagnosis Date   Acute metabolic encephalopathy 9/50/9326   Acute respiratory failure with hypoxia ([place]) 03/20/2022   Acute respiratory failure with hypoxia and hypercapnia ([facility]) 03/06/2022   CHF (congestive heart failure) (H[facility])    Dyslipidemia    Hypertension     Current Outpatient Medications  Medication Sig Dispense Refill   albuterol (VENTOLIN HFA) 108 (90 Base) MCG/ACT inhaler Inhale 2 puffs into the lungs every 4 (four) hours as needed for wheezing or shortness of breath. 1 each 11  aspirin EC 81 MG tablet Take 1 tablet (81 mg total) by mouth daily. Swallow whole. 90 tablet 3   atorvastatin (LIPITOR) 80 MG tablet Take 1 tablet (80 mg total) by mouth every evening. 90 tablet 3   bisoprolol (ZEBETA) 5 MG tablet TAKE 1 Tablet BY MOUTH ONCE DAILY 30 tablet 6    Budeson-Glycopyrrol-Formoterol (BREZTRI AEROSPHERE) 160-9-4.8 MCG/ACT AERO Inhale 2 puffs into the lungs as needed. 10.6 g 11   FARXIGA 10 MG TABS tablet TAKE 1 Tablet BY MOUTH ONCE DAILY 30 tablet 5   furosemide (LASIX) 40 MG tablet TAKE 1 Tablet BY MOUTH TWICE DAILY 90 tablet 3   ipratropium-albuterol (DUONEB) 0.5-2.5 (3) MG/3ML SOLN Inhale 3 mLs into the lungs in the morning, at noon, in the evening, and at bedtime. 360 mL 11   isosorbide-hydrALAZINE (BIDIL) 20-37.5 MG tablet Take 1 tablet by mouth 3 (three) times daily. 90 tablet 3   loratadine (ALLERGY RELIEF) 10 MG tablet Take 10 mg by mouth in the morning.     metFORMIN (GLUCOPHAGE) 500 MG tablet Take 1 tablet (500 mg total) by mouth 2 (two) times daily with a meal. 60 tablet 0   montelukast (SINGULAIR) 10 MG tablet Take 10 mg by mouth at bedtime.     nitroGLYCERIN (NITROSTAT) 0.4 MG SL tablet Place 1 tablet (0.4 mg total) under the tongue every 5 (five) minutes as needed for chest pain. 25 tablet 12   potassium chloride SA (KLOR-CON M) 20 MEQ tablet Take 1 tablet (20 mEq total) by mouth daily. 180 tablet 3   predniSONE (DELTASONE) 20 MG tablet Take 1-2 tablets (20-40 mg total) by mouth daily. Take 40mg  daily for 2days then 20mg  daily for 2days then STOP 6 tablet 0   sacubitril-valsartan (ENTRESTO) 24-26 MG Take 1 tablet by mouth 2 (two) times daily. 90 tablet 1   spironolactone (ALDACTONE) 25 MG tablet Take 0.5 tablets (12.5 mg total) by mouth daily. 30 tablet 1   SYMBICORT 160-4.5 MCG/ACT inhaler Inhale 2 puffs into the lungs 2 (two) times daily.     No current facility-administered medications for this encounter.    No Known Allergies    Social History   Socioeconomic History   Marital status: Single    Spouse name: Not on file   Number of children: 1   Years of education: Not on file   Highest education level: 10th grade  Occupational History   Occupation: Disability  Tobacco Use   Smoking status: Former    Packs/day: 0.50     Years: 50.00    Total pack years: 25.00    Types: Cigarettes    Quit date: 03/19/2022    Years since quitting: 0.6   Smokeless tobacco: Never   Tobacco comments:    Smoking cessation  Vaping Use   Vaping Use: Never used  Substance and Sexual Activity   Alcohol use: Not on file    Comment: socially   Drug use: Yes    Types: Marijuana    Comment: past   Sexual activity: Not on file  Other Topics Concern   Not on file  Social History Narrative   He lives with his daughter apparently and 5 grandchildren.  He reports that he smokes probably less than a pack of cigarettes a day.  He occasionally drinks a beer or liquor but not daily.   Social Determinants of Health   Financial Resource Strain: Low Risk  (03/11/2022)   Overall Financial Resource Strain (CARDIA)    Difficulty  of Paying Living Expenses: Not very hard  Food Insecurity: No Food Insecurity (08/14/2022)   Hunger Vital Sign    Worried About Running Out of Food in the Last Year: Never true    Ran Out of Food in the Last Year: Never true  Transportation Needs: Unmet Transportation Needs (10/21/2022)   PRAPARE - Administrator, Civil Service (Medical): Yes    Lack of Transportation (Non-Medical): Yes  Physical Activity: Not on file  Stress: Not on file  Social Connections: Not on file  Intimate Partner Violence: Not At Risk (08/14/2022)   Humiliation, Afraid, Rape, and Kick questionnaire    Fear of Current or Ex-Partner: No    Emotionally Abused: No    Physically Abused: No    Sexually Abused: No      Family History  Problem Relation Age of Onset   Hypertension Mother     Vitals:   10/26/22 0904  BP: (!) 140/82  Pulse: 68  SpO2: 94%  Weight: 70.8 kg (156 lb)     PHYSICAL EXAM: General:  Walked slowly in the clinic. No respiratory difficulty HEENT: normal Neck: supple. no JVD. Carotids 2+ bilat; no bruits. No lymphadenopathy or thryomegaly appreciated. Cor: PMI nondisplaced. Regular rate &  rhythm. No rubs, gallops or murmurs. Lungs: EW throughout.  Abdomen: soft, nontender, nondistended. No hepatosplenomegaly. No bruits or masses. Good bowel sounds. Extremities: no cyanosis, clubbing, rash, edema Neuro: alert & oriented x 3, cranial nerves grossly intact. moves all 4 extremities w/o difficulty. Affect pleasant.  ECG: SR 63 bpm  RBBB   ASSESSMENT & PLAN:   Chronic Systolic Heart Failure/ NICM: -Echo 05/23: LVEF 35%, RV okay -R/LHC 05/23: Nonobstructive CAD, RA mean 12, PA 54/29 (37), PCWP mean 23, LVEDP 22 mmHg - Echo EF improved 40-45%. Out of window for ICD. Had LVH. Needs PYP. Set up next visit.  -NYHA III HF concominant COPD  - Volume status stable. Continue lasix 40 mg twice a day  - Continue Farxiga 10 mg daily  - Continue Entresto 97-103 mg bid - Continue  Bisoprolol to 5 mg daily  - Stop spiro and potassium with recent elevated potassium  - Continue Bidil 1 tab tid   2. Syncope: -Mulitple recent episodes. Recently evaluated in the ED. Event felt to by vasovagal.  - No further issues.  - EKG no arrhythmias.    3. HTN: - Continue current regimen.  -Scheduled for sleep study   4. CAD: -Nonobstructive on LHC 05/23 - No chest pain.  -Continue aspirin and statin   5. Tobacco use/suspected COPD: -Discussed cessation.   6 HLD: -On Atorvastatin -Management per Cardiology or PCP   7. DM II: -New diagnosis -A1c 6.9% in 05/23 -Continue Farxiga  7. SDOH Difficulty paying for food.  He has difficulty with transportation.  - Referred to HF SW and will reach out and see if Managed Medicaid Team for chronic disease management is an option.  - For now continue HF Paramedicine. I will reach out to [facility], HF Paramedic. that has been following him.    Check BMET today.   Follow up in 4 weeks with APP. Refer to [facility] Medicaid for chronic case management. Needs help long term plan.   Tonye Becket, NP-C

## 2022-10-27 ENCOUNTER — Other Ambulatory Visit (HOSPITAL_COMMUNITY): Payer: Self-pay | Admitting: Emergency Medicine

## 2022-10-27 NOTE — Progress Notes (Unsigned)
Paramedicine Encounter    Patient ID: Marcus Tran, male    DOB: Jul 23, 1951, 72 y.o.   MRN: [id number]   BP 130/80 (BP Location: Right Arm, Patient Position: Sitting)   Pulse 73   Resp 16   Wt 150 lb 9.6 oz (68.3 kg)   SpO2 93%   BMI 24.68 kg/m  Weight yesterday-not taken Last visit weight-148lb   Unscheduled home visit today for follow-up from his clinic visit yesterday.  Pt is A&O x 4, skin W&D w/ good color.  He denies chest pain or SOB.  Lung sounds with expiratory wheezes in the upper rt lobe, some ronchi in the bases bilaterally.  Med box reconciled x 1 week.  Reconciliation reflects med change of discontinuing Spironolactone and Potassium.  Reviewed medications and what they were for.  He does well with his med compliance.  I will call [facility] to see what's needed to get him in pill packs in the near future.    Renee Ramus, [place] 10/28/2022   Patient Care Team: Cipriano Mile, NP as PCP - Cheral Bay, MD as PCP - Cardiology (Cardiology)  Patient Active Problem List   Diagnosis Date Noted   COPD exacerbation ([place]) 08/13/2022   Chronic HFrEF (heart failure with reduced ejection fraction) ([place]) 08/13/2022   DM2 (diabetes mellitus, type 2) ([place]) 08/13/2022   Obstructive sleep apnea (adult) (pediatric) 08/08/2022   Acute respiratory failure with hypoxia ([place]) 05/04/2022   Third degree heart block ([place]) 04/15/2022   Snoring 04/15/2022   Nonischemic cardiomyopathy ([place]) 04/15/2022   HFrEF (heart failure with reduced ejection fraction) ([place]) 04/15/2022   COPD with acute exacerbation ([place]) 03/22/2022   Acute on chronic combined systolic and diastolic CHF (congestive heart failure) ([place])    Tobacco abuse 03/08/2022   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   HTN (hypertension) 03/08/2022   Stage 3a chronic kidney disease (CKD) ([place]) 03/08/2022   Coronary artery disease 03/08/2022    Current Outpatient Medications:    albuterol  (VENTOLIN HFA) 108 (90 Base) MCG/ACT inhaler, Inhale 2 puffs into the lungs every 4 (four) hours as needed for wheezing or shortness of breath., Disp: 1 each, Rfl: 11   aspirin EC 81 MG tablet, Take 1 tablet (81 mg total) by mouth daily. Swallow whole., Disp: 90 tablet, Rfl: 3   atorvastatin (LIPITOR) 80 MG tablet, Take 1 tablet (80 mg total) by mouth every evening., Disp: 90 tablet, Rfl: 3   bisoprolol (ZEBETA) 5 MG tablet, TAKE 1 Tablet BY MOUTH ONCE DAILY, Disp: 30 tablet, Rfl: 6   Budeson-Glycopyrrol-Formoterol (BREZTRI AEROSPHERE) 160-9-4.8 MCG/ACT AERO, Inhale 2 puffs into the lungs as needed., Disp: 10.6 g, Rfl: 11   FARXIGA 10 MG TABS tablet, TAKE 1 Tablet BY MOUTH ONCE DAILY, Disp: 30 tablet, Rfl: 5   furosemide (LASIX) 40 MG tablet, TAKE 1 Tablet BY MOUTH TWICE DAILY, Disp: 90 tablet, Rfl: 3   ipratropium-albuterol (DUONEB) 0.5-2.5 (3) MG/3ML SOLN, Inhale 3 mLs into the lungs in the morning, at noon, in the evening, and at bedtime., Disp: 360 mL, Rfl: 11   isosorbide-hydrALAZINE (BIDIL) 20-37.5 MG tablet, Take 1 tablet by mouth 3 (three) times daily., Disp: 90 tablet, Rfl: 3   montelukast (SINGULAIR) 10 MG tablet, Take 10 mg by mouth at bedtime., Disp: , Rfl:    nitroGLYCERIN (NITROSTAT) 0.4 MG SL tablet, Place 1 tablet (0.4 mg total) under the tongue every 5 (five) minutes as needed for chest pain., Disp: 25 tablet, Rfl: 12  sacubitril-valsartan (ENTRESTO) 24-26 MG, Take 1 tablet by mouth 2 (two) times daily., Disp: 90 tablet, Rfl: 1   SYMBICORT 160-4.5 MCG/ACT inhaler, Inhale 2 puffs into the lungs 2 (two) times daily., Disp: , Rfl:    loratadine (ALLERGY RELIEF) 10 MG tablet, Take 10 mg by mouth in the morning. (Patient not taking: Reported on 10/27/2022), Disp: , Rfl:    metFORMIN (GLUCOPHAGE) 500 MG tablet, Take 1 tablet (500 mg total) by mouth 2 (two) times daily with a meal. (Patient not taking: Reported on 10/27/2022), Disp: 60 tablet, Rfl: 0 No Known Allergies    Social History    Socioeconomic History   Marital status: Single    Spouse name: Not on file   Number of children: 1   Years of education: Not on file   Highest education level: 10th grade  Occupational History   Occupation: Disability  Tobacco Use   Smoking status: Former    Packs/day: 0.50    Years: 50.00    Total pack years: 25.00    Types: Cigarettes    Quit date: 03/19/2022    Years since quitting: 0.6   Smokeless tobacco: Never   Tobacco comments:    Smoking cessation  Vaping Use   Vaping Use: Never used  Substance and Sexual Activity   Alcohol use: Not on file    Comment: socially   Drug use: Yes    Types: Marijuana    Comment: past   Sexual activity: Not on file  Other Topics Concern   Not on file  Social History Narrative   He lives with his daughter apparently and 5 grandchildren.  He reports that he smokes probably less than a pack of cigarettes a day.  He occasionally drinks a beer or liquor but not daily.   Social Determinants of Health   Financial Resource Strain: Low Risk  (03/11/2022)   Overall Financial Resource Strain (CARDIA)    Difficulty of Paying Living Expenses: Not very hard  Food Insecurity: Food Insecurity Present (10/26/2022)   Hunger Vital Sign    Worried About Running Out of Food in the Last Year: Often true    Ran Out of Food in the Last Year: Often true  Transportation Needs: Unmet Transportation Needs (10/26/2022)   PRAPARE - Administrator, Civil Service (Medical): Yes    Lack of Transportation (Non-Medical): Yes  Physical Activity: Not on file  Stress: Not on file  Social Connections: Not on file  Intimate Partner Violence: Not At Risk (08/14/2022)   Humiliation, Afraid, Rape, and Kick questionnaire    Fear of Current or Ex-Partner: No    Emotionally Abused: No    Physically Abused: No    Sexually Abused: No    Physical Exam      Future Appointments  Date Time Provider Department Center  11/23/2022 11:30 AM [facility] PA/NP [facility]  None  12/31/2022 11:30 AM Marinus Maw, MD [facility]       Beatrix Shipper, Paramedic-Paramedic [phone] [facility] Paramedic  10/27/22

## 2022-10-28 ENCOUNTER — Encounter (HOSPITAL_COMMUNITY): Payer: Self-pay | Admitting: Emergency Medicine

## 2022-10-28 NOTE — Progress Notes (Signed)
Picked up two boxes of food from [place]. [employer]  and delivered to Mr. Koppelman due to food insecurities.    Renee Ramus, [place] 10/28/2022

## 2022-11-04 ENCOUNTER — Other Ambulatory Visit (HOSPITAL_COMMUNITY): Payer: Self-pay | Admitting: Emergency Medicine

## 2022-11-04 NOTE — Progress Notes (Unsigned)
Paramedicine Encounter    Patient ID: Marcus Tran, male    DOB: 1951-06-30, 72 y.o.   MRN: [id number]   Complaints Congested cough, no chest pain or SOB  Assessment A&O x 4, skin W&D. Congested cough  Compliance with meds - missed two doses  Pill box filled x 1 week  Refills needed Albuterol  Meds changes since last visit none    Social changes none   Wt 153 lb 3.2 oz (69.5 kg)   BMI 25.11 kg/m  Weight yesterday-not taken Last visit weight-153lb  Marcus Tran has missed two doses of his 1 week regimen.  Med box reconciled x 1 week.  Pt has a  productive cough w/ yellow sputum.  Afebrile.  Some wheezing in the upper lobes.  He did a albuterol nebulizer treatment this morning and I suggested he do another one.  Called in refills to [facility] on Albuterol and will be delivered tomorrow.  Asked him if he would like to go to urgent care or hospital for further evaluation and care and he said no.  Advised him if he felt like his breathing is getting worse to call 911 and he advised he would do so. Also noted this visit that there was no heat in the house and he says he is using a space heater.  I relayed this info to [employer].  ACTION: Home visit completed    Marcus Tran [phone] 11/04/22  Patient Care Team: Cipriano Mile, NP as PCP - Cheral Bay, MD as PCP - Cardiology (Cardiology)  Patient Active Problem List   Diagnosis Date Noted   COPD exacerbation ([place]) 08/13/2022   Chronic HFrEF (heart failure with reduced ejection fraction) ([place]) 08/13/2022   DM2 (diabetes mellitus, type 2) ([place]) 08/13/2022   Obstructive sleep apnea (adult) (pediatric) 08/08/2022   Acute respiratory failure with hypoxia ([place]) 05/04/2022   Third degree heart block ([place]) 04/15/2022   Snoring 04/15/2022   Nonischemic cardiomyopathy ([place]) 04/15/2022   HFrEF (heart failure with reduced ejection fraction) ([place]) 04/15/2022   COPD with acute exacerbation ([place]) 03/22/2022    Acute on chronic combined systolic and diastolic CHF (congestive heart failure) ([place])    Tobacco abuse 03/08/2022   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   HTN (hypertension) 03/08/2022   Stage 3a chronic kidney disease (CKD) ([place]) 03/08/2022   Coronary artery disease 03/08/2022    Current Outpatient Medications:    albuterol (VENTOLIN HFA) 108 (90 Base) MCG/ACT inhaler, Inhale 2 puffs into the lungs every 4 (four) hours as needed for wheezing or shortness of breath., Disp: 1 each, Rfl: 11   aspirin EC 81 MG tablet, Take 1 tablet (81 mg total) by mouth daily. Swallow whole., Disp: 90 tablet, Rfl: 3   atorvastatin (LIPITOR) 80 MG tablet, Take 1 tablet (80 mg total) by mouth every evening., Disp: 90 tablet, Rfl: 3   bisoprolol (ZEBETA) 5 MG tablet, TAKE 1 Tablet BY MOUTH ONCE DAILY, Disp: 30 tablet, Rfl: 6   Budeson-Glycopyrrol-Formoterol (BREZTRI AEROSPHERE) 160-9-4.8 MCG/ACT AERO, Inhale 2 puffs into the lungs as needed., Disp: 10.6 g, Rfl: 11   FARXIGA 10 MG TABS tablet, TAKE 1 Tablet BY MOUTH ONCE DAILY, Disp: 30 tablet, Rfl: 5   furosemide (LASIX) 40 MG tablet, TAKE 1 Tablet BY MOUTH TWICE DAILY, Disp: 90 tablet, Rfl: 3   ipratropium-albuterol (DUONEB) 0.5-2.5 (3) MG/3ML SOLN, Inhale 3 mLs into the lungs in the morning, at noon, in the evening, and at bedtime., Disp: 360 mL, Rfl:  11   isosorbide-hydrALAZINE (BIDIL) 20-37.5 MG tablet, Take 1 tablet by mouth 3 (three) times daily., Disp: 90 tablet, Rfl: 3   metFORMIN (GLUCOPHAGE) 500 MG tablet, Take 1 tablet (500 mg total) by mouth 2 (two) times daily with a meal., Disp: 60 tablet, Rfl: 0   montelukast (SINGULAIR) 10 MG tablet, Take 10 mg by mouth at bedtime., Disp: , Rfl:    sacubitril-valsartan (ENTRESTO) 24-26 MG, Take 1 tablet by mouth 2 (two) times daily., Disp: 90 tablet, Rfl: 1   SYMBICORT 160-4.5 MCG/ACT inhaler, Inhale 2 puffs into the lungs 2 (two) times daily., Disp: , Rfl:    loratadine (ALLERGY RELIEF) 10 MG tablet,  Take 10 mg by mouth in the morning. (Patient not taking: Reported on 10/27/2022), Disp: , Rfl:    nitroGLYCERIN (NITROSTAT) 0.4 MG SL tablet, Place 1 tablet (0.4 mg total) under the tongue every 5 (five) minutes as needed for chest pain. (Patient not taking: Reported on 11/04/2022), Disp: 25 tablet, Rfl: 12 No Known Allergies   Social History   Socioeconomic History   Marital status: Single    Spouse name: Not on file   Number of children: 1   Years of education: Not on file   Highest education level: 10th grade  Occupational History   Occupation: Disability  Tobacco Use   Smoking status: Former    Packs/day: 0.50    Years: 50.00    Total pack years: 25.00    Types: Cigarettes    Quit date: 03/19/2022    Years since quitting: 0.6   Smokeless tobacco: Never   Tobacco comments:    Smoking cessation  Vaping Use   Vaping Use: Never used  Substance and Sexual Activity   Alcohol use: Not on file    Comment: socially   Drug use: Yes    Types: Marijuana    Comment: past   Sexual activity: Not on file  Other Topics Concern   Not on file  Social History Narrative   He lives with his daughter apparently and 5 grandchildren.  He reports that he smokes probably less than a pack of cigarettes a day.  He occasionally drinks a beer or liquor but not daily.   Social Determinants of Health   Financial Resource Strain: Low Risk  (03/11/2022)   Overall Financial Resource Strain (CARDIA)    Difficulty of Paying Living Expenses: Not very hard  Food Insecurity: Food Insecurity Present (10/26/2022)   Hunger Vital Sign    Worried About Running Out of Food in the Last Year: Often true    Ran Out of Food in the Last Year: Often true  Transportation Needs: Unmet Transportation Needs (10/26/2022)   PRAPARE - Hydrologist (Medical): Yes    Lack of Transportation (Non-Medical): Yes  Physical Activity: Not on file  Stress: Not on file  Social Connections: Not on file   Intimate Partner Violence: Not At Risk (08/14/2022)   Humiliation, Afraid, Rape, and Kick questionnaire    Fear of Current or Ex-Partner: No    Emotionally Abused: No    Physically Abused: No    Sexually Abused: No    Physical Exam      Future Appointments  Date Time Provider [place]  11/23/2022 11:30 AM [facility] PA/NP [facility] None  12/31/2022 11:30 AM Evans Lance, MD [facility]

## 2022-11-10 ENCOUNTER — Other Ambulatory Visit: Payer: Self-pay

## 2022-11-10 ENCOUNTER — Inpatient Hospital Stay (HOSPITAL_COMMUNITY)
Admission: EM | Admit: 2022-11-10 | Discharge: 2022-11-12 | DRG: 190 | Disposition: A | Discharge status: Home / Self Care | Payer: 59 | Attending: Infectious Diseases | Admitting: Infectious Diseases

## 2022-11-10 ENCOUNTER — Emergency Department (HOSPITAL_COMMUNITY): Payer: 59

## 2022-11-10 DIAGNOSIS — F17210 Nicotine dependence, cigarettes, uncomplicated: Secondary | ICD-10-CM | POA: Diagnosis present

## 2022-11-10 DIAGNOSIS — Z91148 Patient's other noncompliance with medication regimen for other reason: Secondary | ICD-10-CM

## 2022-11-10 DIAGNOSIS — J120 Adenoviral pneumonia: Secondary | ICD-10-CM

## 2022-11-10 DIAGNOSIS — N1831 Chronic kidney disease, stage 3a: Secondary | ICD-10-CM | POA: Diagnosis present

## 2022-11-10 DIAGNOSIS — E785 Hyperlipidemia, unspecified: Secondary | ICD-10-CM | POA: Diagnosis present

## 2022-11-10 DIAGNOSIS — Z7982 Long term (current) use of aspirin: Secondary | ICD-10-CM

## 2022-11-10 DIAGNOSIS — I428 Other cardiomyopathies: Secondary | ICD-10-CM | POA: Diagnosis present

## 2022-11-10 DIAGNOSIS — J9601 Acute respiratory failure with hypoxia: Secondary | ICD-10-CM

## 2022-11-10 DIAGNOSIS — Z79899 Other long term (current) drug therapy: Secondary | ICD-10-CM

## 2022-11-10 DIAGNOSIS — Z1152 Encounter for screening for COVID-19: Secondary | ICD-10-CM

## 2022-11-10 DIAGNOSIS — B970 Adenovirus as the cause of diseases classified elsewhere: Secondary | ICD-10-CM | POA: Diagnosis present

## 2022-11-10 DIAGNOSIS — E860 Dehydration: Secondary | ICD-10-CM | POA: Diagnosis present

## 2022-11-10 DIAGNOSIS — I2510 Atherosclerotic heart disease of native coronary artery without angina pectoris: Secondary | ICD-10-CM | POA: Diagnosis present

## 2022-11-10 DIAGNOSIS — Z7951 Long term (current) use of inhaled steroids: Secondary | ICD-10-CM

## 2022-11-10 DIAGNOSIS — I130 Hypertensive heart and chronic kidney disease with heart failure and stage 1 through stage 4 chronic kidney disease, or unspecified chronic kidney disease: Secondary | ICD-10-CM | POA: Diagnosis present

## 2022-11-10 DIAGNOSIS — J441 Chronic obstructive pulmonary disease with (acute) exacerbation: Principal | ICD-10-CM | POA: Diagnosis present

## 2022-11-10 DIAGNOSIS — E871 Hypo-osmolality and hyponatremia: Secondary | ICD-10-CM | POA: Diagnosis present

## 2022-11-10 DIAGNOSIS — E874 Mixed disorder of acid-base balance: Secondary | ICD-10-CM | POA: Diagnosis present

## 2022-11-10 DIAGNOSIS — E1122 Type 2 diabetes mellitus with diabetic chronic kidney disease: Secondary | ICD-10-CM | POA: Diagnosis present

## 2022-11-10 DIAGNOSIS — I5022 Chronic systolic (congestive) heart failure: Secondary | ICD-10-CM | POA: Diagnosis present

## 2022-11-10 DIAGNOSIS — N179 Acute kidney failure, unspecified: Secondary | ICD-10-CM | POA: Diagnosis present

## 2022-11-10 DIAGNOSIS — F17200 Nicotine dependence, unspecified, uncomplicated: Secondary | ICD-10-CM | POA: Diagnosis present

## 2022-11-10 DIAGNOSIS — Z7984 Long term (current) use of oral hypoglycemic drugs: Secondary | ICD-10-CM

## 2022-11-10 LAB — CBC
HCT: 47.3 % (ref 39.0–52.0)
Hemoglobin: 15.9 g/dL (ref 13.0–17.0)
MCH: 30.4 pg (ref 26.0–34.0)
MCHC: 33.6 g/dL (ref 30.0–36.0)
MCV: 90.4 fL (ref 80.0–100.0)
Platelets: 240 10*3/uL (ref 150–400)
RBC: 5.23 MIL/uL (ref 4.22–5.81)
RDW: 13.4 % (ref 11.5–15.5)
WBC: 6.2 10*3/uL (ref 4.0–10.5)
nRBC: 0 % (ref 0.0–0.2)

## 2022-11-10 LAB — BASIC METABOLIC PANEL

## 2022-11-10 NOTE — ED Triage Notes (Signed)
Pt BIB EMS. Per EMS, pt started having increasing SOB over the last 2 days. Pt has taken 15 breathing treatments since with no relief. O2 sats @ 82% on room. Duoneb, 125mg  of solumedrol, and 2mg  of mag given en route. Cpap en route. A/Ox4

## 2022-11-10 NOTE — ED Provider Notes (Signed)
[place] EMERGENCY DEPARTMENT AT [facility] Provider Note   CSN: [id number] Arrival date & time: 11/10/22  2309     History {Add pertinent medical, surgical, social history, OB history to HPI:1} Chief Complaint  Patient presents with   Shortness of Breath   Level 5 caveat due to respiratory distress Marcus Tran is a 72 y.o. male.  The history is provided by the patient. The history is limited by the condition of the patient.  Shortness of Breath  Patient with history of CHF and COPD presents with shortness of breath.  Patient has been having increasing shortness of breath for the past 2 days.  He has  taken over 15 breathing treatments without any relief.  EMS reports patient was hypoxic to the 80s on arrival.  Patient was given DuoNeb, Solu-Medrol magnesium en route.  He was also placed on noninvasive evaluation.    Home Medications Prior to Admission medications   Medication Sig Start Date End Date Taking? Authorizing Provider  albuterol (VENTOLIN HFA) 108 (90 Base) MCG/ACT inhaler Inhale 2 puffs into the lungs every 4 (four) hours as needed for wheezing or shortness of breath. 04/06/22   Hunsucker, Lesia Sago, MD  aspirin EC 81 MG tablet Take 1 tablet (81 mg total) by mouth daily. Swallow whole. 03/24/22   Alver Sorrow, NP  atorvastatin (LIPITOR) 80 MG tablet Take 1 tablet (80 mg total) by mouth every evening. 03/24/22   Alver Sorrow, NP  bisoprolol (ZEBETA) 5 MG tablet TAKE 1 Tablet BY MOUTH ONCE DAILY 08/28/22   Rollene Rotunda, MD  Budeson-Glycopyrrol-Formoterol (BREZTRI AEROSPHERE) 160-9-4.8 MCG/ACT AERO Inhale 2 puffs into the lungs as needed. 05/21/22   Hunsucker, Lesia Sago, MD  FARXIGA 10 MG TABS tablet TAKE 1 Tablet BY MOUTH ONCE DAILY 08/26/22   Alver Sorrow, NP  furosemide (LASIX) 40 MG tablet TAKE 1 Tablet BY MOUTH TWICE DAILY 09/25/22   Rollene Rotunda, MD  ipratropium-albuterol (DUONEB) 0.5-2.5 (3) MG/3ML SOLN Inhale 3 mLs into the lungs in the  morning, at noon, in the evening, and at bedtime. 05/21/22   Hunsucker, Lesia Sago, MD  isosorbide-hydrALAZINE (BIDIL) 20-37.5 MG tablet Take 1 tablet by mouth 3 (three) times daily. 03/24/22   Alver Sorrow, NP  loratadine (ALLERGY RELIEF) 10 MG tablet Take 10 mg by mouth in the morning. Patient not taking: Reported on 10/27/2022    [provider]  metFORMIN (GLUCOPHAGE) 500 MG tablet Take 1 tablet (500 mg total) by mouth 2 (two) times daily with a meal. 05/07/22   Leroy Sea, MD  montelukast (SINGULAIR) 10 MG tablet Take 10 mg by mouth at bedtime.    [provider]  nitroGLYCERIN (NITROSTAT) 0.4 MG SL tablet Place 1 tablet (0.4 mg total) under the tongue every 5 (five) minutes as needed for chest pain. Patient not taking: Reported on 11/04/2022 03/11/22   Cipriano Bunker, MD  sacubitril-valsartan (ENTRESTO) 24-26 MG Take 1 tablet by mouth 2 (two) times daily. 09/25/22   Rollene Rotunda, MD  SYMBICORT 160-4.5 MCG/ACT inhaler Inhale 2 puffs into the lungs 2 (two) times daily.    [provider]      Allergies    Patient has no known allergies.    Review of Systems   Review of Systems  Unable to perform ROS: Severe respiratory distress  Respiratory:  Positive for shortness of breath.     Physical Exam Updated Vital Signs BP 118/74 (BP Location: Left Arm)   Pulse Marland Kitchen)  102   Resp (!) 30   Ht 1.803 m (5\' 11" )   Wt 70.3 kg   SpO2 99%   BMI 21.62 kg/m  Physical Exam CONSTITUTIONAL: ill-appearing, distress noted HEAD: Normocephalic/atraumatic EYES: EOMI ENMT: Mask in place NECK: supple no meningeal signs CV: S1/S2 noted, tachycardic LUNGS: Tachycardia and wheezing bilaterally ABDOMEN: soft NEURO: Pt is awake/alert/appropriate, moves all extremitiesx4.  No facial droop.   EXTREMITIES: pulses normal/equalx4, full ROM, no lower extremity edema SKIN: warm, color normal PSYCH: Anxious  ED Results / Procedures / Treatments   Labs (all labs ordered are  listed, but only abnormal results are displayed) Labs Reviewed  RESP PANEL BY RT-PCR (RSV, FLU A&B, COVID)  RVPGX2  CBC  BASIC METABOLIC PANEL  BRAIN NATRIURETIC PEPTIDE    EKG EKG Interpretation  Date/Time:  Tuesday November 10 2022 23:21:20 EST Ventricular Rate:  103 PR Interval:  150 QRS Duration: 126 QT Interval:  414 QTC Calculation: 542 R Axis:   38 Text Interpretation: Sinus tachycardia Ventricular premature complex Right bundle branch block Abnormal ekg Confirmed by Ripley Fraise [phone]) on 11/10/2022 11:30:34 PM  Radiology No results found.  Procedures .Critical Care  Performed by: Ripley Fraise, MD Authorized by: Ripley Fraise, MD   Critical care provider statement:    Critical care start time:  11/10/2022 11:19 PM   Critical care end time:  11/10/2022 11:19 PM   Critical care time was exclusive of:  Separately billable procedures and treating other patients   Critical care was necessary to treat or prevent imminent or life-threatening deterioration of the following conditions:  Respiratory failure   Critical care was time spent personally by me on the following activities:  Obtaining history from patient or surrogate, examination of patient, development of treatment plan with patient or surrogate, pulse oximetry, ordering and review of radiographic studies, ordering and review of laboratory studies, ordering and performing treatments and interventions, re-evaluation of patient's condition and evaluation of patient's response to treatment   I assumed direction of critical care for this patient from another provider in my specialty: no     Care discussed with: admitting provider     {Document cardiac monitor, telemetry assessment procedure when appropriate:1}  Medications Ordered in ED Medications - No data to display  ED Course/ Medical Decision Making/ A&P   {   Click here for ABCD2, HEART and other calculatorsREFRESH Note before signing :1}                           Medical Decision Making Amount and/or Complexity of Data Reviewed Labs: ordered. Radiology: ordered.  Risk Decision regarding hospitalization.   This patient presents to the ED for concern of shortness of breath, this involves an extensive number of treatment options, and is a complaint that carries with it a high risk of complications and morbidity.  The differential diagnosis includes but is not limited to Acute coronary syndrome, pneumonia, acute pulmonary edema, pneumothorax, acute anemia, pulmonary embolism    Comorbidities that complicate the patient evaluation: Patient's presentation is complicated by their history of COPD and CHF  Social Determinants of Health: Patient's  ongoing tobacco use   increases the complexity of managing their presentation  Additional history obtained: Additional history obtained from EMS discussed with paramedic at bedside Records reviewed previous admission documents  Lab Tests: I Ordered, and personally interpreted labs.  The pertinent results include:  ***  Imaging Studies ordered: I ordered imaging studies including X-ray chest  I independently visualized and interpreted imaging which showed no acute findings I agree with the radiologist interpretation  Cardiac Monitoring: The patient was maintained on a cardiac monitor.  I personally viewed and interpreted the cardiac monitor which showed an underlying rhythm of:  sinus tachycardia  Medicines ordered and prescription drug management: I ordered medication including ***  for ***  Reevaluation of the patient after these medicines showed that the patient    {resolved/improved/worsened:23923::"improved"}  Test Considered: Patient is low risk / negative by ***, therefore do not feel that *** is indicated.  Critical Interventions:   placed on BiPAP  Consultations Obtained: I requested consultation with the {consultation:26851}, and discussed  findings as well as pertinent plan  - they recommend: ***  Reevaluation: After the interventions noted above, I reevaluated the patient and found that they have :{resolved/improved/worsened:23923::"improved"}  Complexity of problems addressed: Patient's presentation is most consistent with  acute presentation with potential threat to life or bodily function  Disposition: After consideration of the diagnostic results and the patient's response to treatment,  I feel that the patent would benefit from admission   .     {Document critical care time when appropriate:1} {Document review of labs and clinical decision tools ie heart score, Chads2Vasc2 etc:1}  {Document your independent review of radiology images, and any outside records:1} {Document your discussion with family members, caretakers, and with consultants:1} {Document social determinants of health affecting pt's care:1} {Document your decision making why or why not admission, treatments were needed:1} Final Clinical Impression(s) / ED Diagnoses Final diagnoses:  Acute respiratory failure with hypoxia ([place])    Rx / DC Orders ED Discharge Orders     None

## 2022-11-11 ENCOUNTER — Telehealth (HOSPITAL_COMMUNITY): Payer: Self-pay | Admitting: Emergency Medicine

## 2022-11-11 ENCOUNTER — Encounter (HOSPITAL_COMMUNITY): Payer: Self-pay | Admitting: Infectious Diseases

## 2022-11-11 DIAGNOSIS — N182 Chronic kidney disease, stage 2 (mild): Secondary | ICD-10-CM | POA: Diagnosis not present

## 2022-11-11 DIAGNOSIS — J441 Chronic obstructive pulmonary disease with (acute) exacerbation: Secondary | ICD-10-CM

## 2022-11-11 DIAGNOSIS — I159 Secondary hypertension, unspecified: Secondary | ICD-10-CM | POA: Diagnosis not present

## 2022-11-11 DIAGNOSIS — Z87891 Personal history of nicotine dependence: Secondary | ICD-10-CM | POA: Diagnosis not present

## 2022-11-11 DIAGNOSIS — F17210 Nicotine dependence, cigarettes, uncomplicated: Secondary | ICD-10-CM

## 2022-11-11 DIAGNOSIS — E1122 Type 2 diabetes mellitus with diabetic chronic kidney disease: Secondary | ICD-10-CM

## 2022-11-11 LAB — BASIC METABOLIC PANEL
Anion gap: 14 (ref 5–15)
Anion gap: 17 — ABNORMAL HIGH (ref 5–15)
BUN: 28 mg/dL — ABNORMAL HIGH (ref 8–23)
BUN: 34 mg/dL — ABNORMAL HIGH (ref 8–23)
CO2: 22 mmol/L (ref 22–32)
CO2: 29 mmol/L (ref 22–32)
Calcium: 9.1 mg/dL (ref 8.9–10.3)
Calcium: 9.2 mg/dL (ref 8.9–10.3)
Chloride: 91 mmol/L — ABNORMAL LOW (ref 98–111)
Chloride: 91 mmol/L — ABNORMAL LOW (ref 98–111)
Creatinine, Ser: 1.87 mg/dL — ABNORMAL HIGH (ref 0.61–1.24)
Creatinine, Ser: 1.91 mg/dL — ABNORMAL HIGH (ref 0.61–1.24)
GFR, Estimated: 37 mL/min — ABNORMAL LOW (ref >60)
GFR, Estimated: 38 mL/min — ABNORMAL LOW (ref >60)
Glucose, Bld: 119 mg/dL — ABNORMAL HIGH (ref 70–99)
Glucose, Bld: 204 mg/dL — ABNORMAL HIGH (ref 70–99)
Potassium: 3.6 mmol/L (ref 3.5–5.1)
Potassium: 3.8 mmol/L (ref 3.5–5.1)
Sodium: 130 mmol/L — ABNORMAL LOW (ref 135–145)
Sodium: 134 mmol/L — ABNORMAL LOW (ref 135–145)

## 2022-11-11 LAB — RESPIRATORY PANEL BY PCR

## 2022-11-11 LAB — GLUCOSE, CAPILLARY
Glucose-Capillary: 242 mg/dL — ABNORMAL HIGH (ref 70–99)
Glucose-Capillary: 140 mg/dL — ABNORMAL HIGH (ref 70–99)
Glucose-Capillary: 150 mg/dL — ABNORMAL HIGH (ref 70–99)
Glucose-Capillary: 149 mg/dL — ABNORMAL HIGH (ref 70–99)
Glucose-Capillary: 155 mg/dL — ABNORMAL HIGH (ref 70–99)

## 2022-11-11 LAB — HEMOGLOBIN A1C
Hgb A1c MFr Bld: 6.8 % — ABNORMAL HIGH (ref 4.8–5.6)
Mean Plasma Glucose: 148.46 mg/dL

## 2022-11-11 LAB — RESP PANEL BY RT-PCR (RSV, FLU A&B, COVID)  RVPGX2
Influenza A by PCR: NEGATIVE
Influenza B by PCR: NEGATIVE
Resp Syncytial Virus by PCR: NEGATIVE
SARS Coronavirus 2 by RT PCR: NEGATIVE

## 2022-11-11 LAB — PHOSPHORUS: Phosphorus: 4.6 mg/dL (ref 2.5–4.6)

## 2022-11-11 LAB — BRAIN NATRIURETIC PEPTIDE: B Natriuretic Peptide: 148.7 pg/mL — ABNORMAL HIGH (ref 0.0–100.0)

## 2022-11-11 LAB — MAGNESIUM: Magnesium: 2.3 mg/dL (ref 1.7–2.4)

## 2022-11-11 MED ORDER — IPRATROPIUM-ALBUTEROL 0.5-2.5 (3) MG/3ML IN SOLN
3.0000 mL | Freq: Four times a day (QID) | RESPIRATORY_TRACT | Status: DC
  Administered 2022-11-11 (×2): 3 mL via RESPIRATORY_TRACT
  Filled 2022-11-11 (×2): qty 3

## 2022-11-11 MED ORDER — BUDESON-GLYCOPYRROL-FORMOTEROL 160-9-4.8 MCG/ACT IN AERO: 2.0000 | INHALATION_SPRAY | Freq: Every day | RESPIRATORY_TRACT | Status: DC

## 2022-11-11 MED ORDER — INSULIN ASPART 100 UNIT/ML IJ SOLN: 0.0000 [IU] | Freq: Every day | INTRAMUSCULAR | Status: DC

## 2022-11-11 MED ORDER — ASPIRIN 81 MG PO TBEC
81.0000 mg | DELAYED_RELEASE_TABLET | Freq: Every day | ORAL | Status: DC
  Administered 2022-11-11 – 2022-11-12 (×2): 81 mg via ORAL
  Filled 2022-11-11 (×2): qty 1

## 2022-11-11 MED ORDER — ATORVASTATIN CALCIUM 40 MG PO TABS
80.0000 mg | ORAL_TABLET | Freq: Every evening | ORAL | Status: DC
  Administered 2022-11-11: 80 mg via ORAL
  Filled 2022-11-11: qty 2

## 2022-11-11 MED ORDER — IPRATROPIUM-ALBUTEROL 0.5-2.5 (3) MG/3ML IN SOLN
3.0000 mL | RESPIRATORY_TRACT | Status: DC
  Administered 2022-11-11 (×2): 3 mL via RESPIRATORY_TRACT
  Filled 2022-11-11 (×2): qty 3

## 2022-11-11 MED ORDER — NICOTINE 21 MG/24HR TD PT24
21.0000 mg | MEDICATED_PATCH | Freq: Every day | TRANSDERMAL | Status: DC
  Administered 2022-11-11 – 2022-11-12 (×2): 21 mg via TRANSDERMAL
  Filled 2022-11-11 (×2): qty 1

## 2022-11-11 MED ORDER — PREDNISONE 20 MG PO TABS
40.0000 mg | ORAL_TABLET | Freq: Every day | ORAL | Status: DC
  Administered 2022-11-11 – 2022-11-12 (×2): 40 mg via ORAL
  Filled 2022-11-11 (×2): qty 2

## 2022-11-11 MED ORDER — POLYETHYLENE GLYCOL 3350 17 G PO PACK
17.0000 g | PACK | Freq: Every day | ORAL | Status: DC
  Administered 2022-11-11 – 2022-11-12 (×2): 17 g via ORAL
  Filled 2022-11-11 (×2): qty 1

## 2022-11-11 MED ORDER — MOMETASONE FURO-FORMOTEROL FUM 200-5 MCG/ACT IN AERO
2.0000 | INHALATION_SPRAY | Freq: Two times a day (BID) | RESPIRATORY_TRACT | Status: DC
  Administered 2022-11-11 – 2022-11-12 (×3): 2 via RESPIRATORY_TRACT
  Filled 2022-11-11: qty 8.8

## 2022-11-11 MED ORDER — ENOXAPARIN SODIUM 40 MG/0.4ML IJ SOSY
40.0000 mg | PREFILLED_SYRINGE | INTRAMUSCULAR | Status: DC
  Administered 2022-11-11: 40 mg via SUBCUTANEOUS
  Filled 2022-11-11: qty 0.4

## 2022-11-11 MED ORDER — UMECLIDINIUM BROMIDE 62.5 MCG/ACT IN AEPB
1.0000 | INHALATION_SPRAY | Freq: Every day | RESPIRATORY_TRACT | Status: DC
  Administered 2022-11-11 – 2022-11-12 (×2): 1 via RESPIRATORY_TRACT
  Filled 2022-11-11: qty 7

## 2022-11-11 MED ORDER — AZITHROMYCIN 250 MG PO TABS: 250.0000 mg | ORAL_TABLET | Freq: Every day | ORAL | Status: DC

## 2022-11-11 MED ORDER — MONTELUKAST SODIUM 10 MG PO TABS
10.0000 mg | ORAL_TABLET | Freq: Every day | ORAL | Status: DC
  Administered 2022-11-11: 10 mg via ORAL
  Filled 2022-11-11: qty 1

## 2022-11-11 MED ORDER — SIMETHICONE 80 MG PO CHEW
80.0000 mg | CHEWABLE_TABLET | Freq: Four times a day (QID) | ORAL | Status: DC | PRN
  Administered 2022-11-12: 80 mg via ORAL
  Filled 2022-11-11 (×2): qty 1

## 2022-11-11 MED ORDER — SODIUM CHLORIDE 0.9 % IV SOLN: INTRAVENOUS | Status: AC

## 2022-11-11 MED ORDER — INSULIN ASPART 100 UNIT/ML IJ SOLN
0.0000 [IU] | Freq: Three times a day (TID) | INTRAMUSCULAR | Status: DC
  Administered 2022-11-11: 1 [IU] via SUBCUTANEOUS
  Administered 2022-11-11: 3 [IU] via SUBCUTANEOUS
  Administered 2022-11-11 – 2022-11-12 (×3): 1 [IU] via SUBCUTANEOUS

## 2022-11-11 MED ORDER — BISOPROLOL FUMARATE 5 MG PO TABS
5.0000 mg | ORAL_TABLET | Freq: Every day | ORAL | Status: DC
  Administered 2022-11-11 – 2022-11-12 (×2): 5 mg via ORAL
  Filled 2022-11-11 (×2): qty 1

## 2022-11-11 MED ORDER — ALBUTEROL SULFATE (2.5 MG/3ML) 0.083% IN NEBU
3.0000 mL | INHALATION_SOLUTION | Freq: Once | RESPIRATORY_TRACT | Status: AC
  Administered 2022-11-11: 3 mL via RESPIRATORY_TRACT
  Filled 2022-11-11: qty 3

## 2022-11-11 MED ORDER — DAPAGLIFLOZIN PROPANEDIOL 10 MG PO TABS
10.0000 mg | ORAL_TABLET | Freq: Every day | ORAL | Status: DC
  Administered 2022-11-11 – 2022-11-12 (×2): 10 mg via ORAL
  Filled 2022-11-11 (×2): qty 1

## 2022-11-11 MED ORDER — GUAIFENESIN-DM 100-10 MG/5ML PO SYRP
5.0000 mL | ORAL_SOLUTION | ORAL | Status: DC | PRN
  Administered 2022-11-11 – 2022-11-12 (×2): 5 mL via ORAL
  Filled 2022-11-11 (×2): qty 10

## 2022-11-11 NOTE — Progress Notes (Addendum)
Continuous pulse oxymetry placed per order at 1020 AM.  Spo2 is 91% with 5L [place]

## 2022-11-11 NOTE — ED Notes (Signed)
ED TO INPATIENT HANDOFF REPORT  ED Nurse Name and Phone #: Hal Hope [phone]  S Name/Age/Gender Marcus Tran 72 y.o. male Room/Bed: 024C/024C  Code Status   Code Status: Full Code  Home/SNF/Other Home Patient oriented to: self, place, time, and situation Is this baseline? Yes   Triage Complete: Triage complete  Chief Complaint COPD exacerbation ([place]) [J44.1]  Triage Note Pt BIB EMS. Per EMS, pt started having increasing SOB over the last 2 days. Pt has taken 15 breathing treatments since with no relief. O2 sats @ 82% on room. Duoneb, 125mg  of solumedrol, and 2mg  of mag given en route. Cpap en route. A/Ox4    Allergies No Known Allergies  Level of Care/Admitting Diagnosis ED Disposition     ED Disposition  Admit   Condition  --   Comment  Hospital Area: [facility] [100100]  Level of Care: Med-Surg [16]  May place patient in observation at [facility] or [place] if equivalent level of care is available:: No  Covid Evaluation: Asymptomatic - no recent exposure (last 10 days) testing not required  Diagnosis: COPD exacerbation [facility]) [[id number]]  Admitting Physician: Liane Comber  Attending Physician: Johnnye Sima, JEFFREY C [[id number]]          B Medical/Surgery History Past Medical History:  Diagnosis Date   Acute metabolic encephalopathy 9/62/9528   Acute respiratory failure with hypoxia ([place]) 03/20/2022   Acute respiratory failure with hypoxia and hypercapnia ([place]) 03/06/2022   CHF (congestive heart failure) ([place])    Dyslipidemia    Hypertension    Past Surgical History:  Procedure Laterality Date   LEG SURGERY     Right-sided as result of trauma   RIGHT/LEFT HEART CATH AND CORONARY ANGIOGRAPHY N/A 03/09/2022   Procedure: RIGHT/LEFT HEART CATH AND CORONARY ANGIOGRAPHY;  Surgeon: Lorretta Harp, MD;  Location: [place] [facility];  Service: Cardiovascular;  Laterality: N/A;   Umbilicus surgery     As a child     A IV  Location/Drains/Wounds Patient Lines/Drains/Airways Status     Active Line/Drains/Airways     Name Placement date Placement time Site Days   Peripheral IV 11/10/22 20 G Right Antecubital 11/10/22  2310  Antecubital  1            Intake/Output Last 24 hours No intake or output data in the 24 hours ending 11/11/22 0420  Labs/Imaging Results for orders placed or performed during the hospital encounter of 11/10/22 (from the past 48 hour(s))  Resp panel by RT-PCR (RSV, Flu A&B, Covid) Anterior Nasal Swab     Status: None   Collection Time: 11/10/22 11:17 PM   Specimen: Anterior Nasal Swab  Result Value Ref Range   SARS Coronavirus 2 by RT PCR NEGATIVE NEGATIVE    Comment: (NOTE) SARS-CoV-2 target nucleic acids are NOT DETECTED.  The SARS-CoV-2 RNA is generally detectable in upper respiratory specimens during the acute phase of infection. The lowest concentration of SARS-CoV-2 viral copies this assay can detect is 138 copies/mL. A negative result does not preclude SARS-Cov-2 infection and should not be used as the sole basis for treatment or other patient management decisions. A negative result may occur with  improper specimen collection/handling, submission of specimen other than nasopharyngeal swab, presence of viral mutation(s) within the areas targeted by this assay, and inadequate number of viral copies(<138 copies/mL). A negative result must be combined with clinical observations, patient history, and epidemiological information. The expected result is Negative.  Fact Sheet for  Patients:  BloggerCourse.com  Fact Sheet for Healthcare Providers:  SeriousBroker.it  This test is no t yet approved or cleared by the [place] FDA and  has been authorized for detection and/or diagnosis of SARS-CoV-2 by FDA under an Emergency Use Authorization (EUA). This EUA will remain  in effect (meaning this test can be used) for the  duration of the COVID-19 declaration under Section 564(b)(1) of the Act, 21 U.S.C.section 360bbb-3(b)(1), unless the authorization is terminated  or revoked sooner.       Influenza A by PCR NEGATIVE NEGATIVE   Influenza B by PCR NEGATIVE NEGATIVE    Comment: (NOTE) The Xpert Xpress SARS-CoV-2/FLU/RSV plus assay is intended as an aid in the diagnosis of influenza from Nasopharyngeal swab specimens and should not be used as a sole basis for treatment. Nasal washings and aspirates are unacceptable for Xpert Xpress SARS-CoV-2/FLU/RSV testing.  Fact Sheet for Patients: BloggerCourse.com  Fact Sheet for Healthcare Providers: SeriousBroker.it  This test is not yet approved or cleared by the [place] FDA and has been authorized for detection and/or diagnosis of SARS-CoV-2 by FDA under an Emergency Use Authorization (EUA). This EUA will remain in effect (meaning this test can be used) for the duration of the COVID-19 declaration under Section 564(b)(1) of the Act, 21 U.S.C. section 360bbb-3(b)(1), unless the authorization is terminated or revoked.     Resp Syncytial Virus by PCR NEGATIVE NEGATIVE    Comment: (NOTE) Fact Sheet for Patients: BloggerCourse.com  Fact Sheet for Healthcare Providers: SeriousBroker.it  This test is not yet approved or cleared by the [place] FDA and has been authorized for detection and/or diagnosis of SARS-CoV-2 by FDA under an Emergency Use Authorization (EUA). This EUA will remain in effect (meaning this test can be used) for the duration of the COVID-19 declaration under Section 564(b)(1) of the Act, 21 U.S.C. section 360bbb-3(b)(1), unless the authorization is terminated or revoked.  Performed at [facility], [address] [place], [place] [address]   CBC     Status: None   Collection Time: 11/10/22 11:21 PM  Result Value Ref  Range   WBC 6.2 4.0 - 10.5 K/uL   RBC 5.23 4.22 - 5.81 MIL/uL   Hemoglobin 15.9 13.0 - 17.0 g/dL   HCT 74.8 27.0 - 78.6 %   MCV 90.4 80.0 - 100.0 fL   MCH 30.4 26.0 - 34.0 pg   MCHC 33.6 30.0 - 36.0 g/dL   RDW 75.4 49.2 - 01.0 %   Platelets 240 150 - 400 K/uL   nRBC 0.0 0.0 - 0.2 %    Comment: Performed at [facility], [address], [place], [place] [address]  Basic metabolic panel     Status: None   Collection Time: 11/10/22 11:21 PM  Result Value Ref Range   Sodium  135 - 145 mmol/L    SPECIMEN HEMOLYZED. HEMOLYSIS MAY AFFECT INTEGRITY OF RESULTS.    Comment: ORDERING RECOLLECT PER KALEB Grant Henkes RN 11/10/22 2356 M KOROLESKI   Potassium  3.5 - 5.1 mmol/L    SPECIMEN HEMOLYZED. HEMOLYSIS MAY AFFECT INTEGRITY OF RESULTS.    Comment: ORDERING RECOLLECT PER KALEB Lassie Demorest RN 11/10/22 2356 M KOROLESKI   Chloride  98 - 111 mmol/L    SPECIMEN HEMOLYZED. HEMOLYSIS MAY AFFECT INTEGRITY OF RESULTS.    Comment: ORDERING RECOLLECT PER KALEB Ansley Mangiapane RN 11/10/22 2356 M KOROLESKI   CO2  22 - 32 mmol/L    SPECIMEN HEMOLYZED. HEMOLYSIS MAY AFFECT INTEGRITY OF  RESULTS.    Comment: ORDERING RECOLLECT PER KALEB Marvel Mcphillips RN 11/10/22 2356 M KOROLESKI   Glucose, Bld  70 - 99 mg/dL    SPECIMEN HEMOLYZED. HEMOLYSIS MAY AFFECT INTEGRITY OF RESULTS.    Comment: ORDERING RECOLLECT PER KALEB Neal Trulson RN 11/10/22 2356 M KOROLESKI   BUN  8 - 23 mg/dL    SPECIMEN HEMOLYZED. HEMOLYSIS MAY AFFECT INTEGRITY OF RESULTS.    Comment: ORDERING RECOLLECT PER KALEB Tallie Hevia RN 11/10/22 2356 M KOROLESKI   Creatinine, Ser  0.61 - 1.24 mg/dL    SPECIMEN HEMOLYZED. HEMOLYSIS MAY AFFECT INTEGRITY OF RESULTS.    Comment: ORDERING RECOLLECT PER KALEB Berlene Dixson RN 11/10/22 2356 M KOROLESKI   Calcium  8.9 - 10.3 mg/dL    SPECIMEN HEMOLYZED. HEMOLYSIS MAY AFFECT INTEGRITY OF RESULTS.    Comment: ORDERING RECOLLECT PER KALEB Meriem Lemieux RN 11/10/22 2356 M KOROLESKI   GFR, Estimated  >60 mL/min    SPECIMEN HEMOLYZED.  HEMOLYSIS MAY AFFECT INTEGRITY OF RESULTS.    Comment: ORDERING RECOLLECT PER KALEB Theora Vankirk RN 11/10/22 2356 M KOROLESKI   GFR calc Af Amer  >60 mL/min    SPECIMEN HEMOLYZED. HEMOLYSIS MAY AFFECT INTEGRITY OF RESULTS.    Comment: ORDERING RECOLLECT PER KALEB Matson Welch RN 11/10/22 2356 M KOROLESKI   Anion gap  5 - 15    SPECIMEN HEMOLYZED. HEMOLYSIS MAY AFFECT INTEGRITY OF RESULTS.    Comment: ORDERING RECOLLECT PER Michel Santee RN 11/10/22 2356 [place] Performed at [facility], [address], [place], [place] [address]   Brain natriuretic peptide     Status: Abnormal   Collection Time: 11/10/22 11:21 PM  Result Value Ref Range   B Natriuretic Peptide 148.7 (H) 0.0 - 100.0 pg/mL    Comment: Performed at [facility], [address], [place], [place] [address]  Basic metabolic panel     Status: Abnormal   Collection Time: 11/11/22 12:01 AM  Result Value Ref Range   Sodium 134 (L) 135 - 145 mmol/L   Potassium 3.6 3.5 - 5.1 mmol/L   Chloride 91 (L) 98 - 111 mmol/L   CO2 29 22 - 32 mmol/L   Glucose, Bld 119 (H) 70 - 99 mg/dL    Comment: Glucose reference range applies only to samples taken after fasting for at least 8 hours.   BUN 28 (H) 8 - 23 mg/dL   Creatinine, Ser 1.88 (H) 0.61 - 1.24 mg/dL   Calcium 9.2 8.9 - 41.6 mg/dL   GFR, Estimated 37 (L) >60 mL/min    Comment: (NOTE) Calculated using the CKD-EPI Creatinine Equation (2021)    Anion gap 14 5 - 15    Comment: Performed at [facility], [address] [place], [place] [address]   DG Chest Port 1 View  Result Date: 11/10/2022 CLINICAL DATA:  Shortness of breath EXAM: PORTABLE CHEST 1 VIEW COMPARISON:  Chest x-ray 10/16/2022 FINDINGS: The heart size and mediastinal contours are within normal limits. Both lungs are clear. The visualized skeletal structures are unremarkable. IMPRESSION: No active disease. Electronically Signed   By: Darliss Cheney M.D.   On: 11/10/2022 23:31    Pending Labs Unresulted  Labs (From admission, onward)     Start     Ordered   11/11/22 0310  Magnesium  Once,   R        11/11/22 0309   11/11/22 0310  Phosphorus  Once,   R        11/11/22 0309  11/11/22 0228  Respiratory (~20 pathogens) panel by PCR  (Respiratory panel by PCR (~20 pathogens, ~24 hr TAT)  w precautions)  Once,   R        11/11/22 0227   Pending  Hemoglobin A1c  Once,   R       Comments: To assess prior glycemic control    Pending            Vitals/Pain Today's Vitals   11/11/22 0145 11/11/22 0200 11/11/22 0245 11/11/22 0300  BP: 131/85 130/77 135/73 (!) 142/109  Pulse: 94 96 96 88  Resp: (!) 27 (!) 33 17 (!) 23  SpO2: 94% 92% 93% 93%  Weight:      Height:      PainSc:        Isolation Precautions Droplet precaution  Medications Medications  enoxaparin (LOVENOX) injection 40 mg (has no administration in time range)  ipratropium-albuterol (DUONEB) 0.5-2.5 (3) MG/3ML nebulizer solution 3 mL (3 mLs Nebulization Given 11/11/22 0416)  predniSONE (DELTASONE) tablet 40 mg (has no administration in time range)  albuterol (PROVENTIL) (2.5 MG/3ML) 0.083% nebulizer solution 3 mL (3 mLs Nebulization Given 11/11/22 0120)    Mobility walks     Focused Assessments Pulmonary Assessment Handoff:  Lung sounds: Bilateral Breath Sounds: Expiratory wheezes, Inspiratory wheezes O2 Device: Bi-PAP      R Recommendations: See Admitting Provider Note  Report given to:   Additional Notes: Pt A/Ox4. Currently on 5L [place].

## 2022-11-11 NOTE — ED Notes (Addendum)
Pt placed on 5L [place] at this time.

## 2022-11-11 NOTE — H&P (Addendum)
Date: 11/11/2022               Patient Name:  Marcus Tran MRN: [id number]  DOB: [birth year] Age / Sex: 72 y.o., male   PCP: Hillery Aldo, NP         Medical Service: Internal Medicine Teaching Service         Attending Physician: Dr. Ginnie Smart, MD      First Contact: Dr. Rocky Morel, DO Pager [phone]    Second Contact: Dr. Champ Mungo, DO Pager [phone]         After Hours (After 5p/  First Contact Pager: [phone]  weekends / holidays): Second Contact Pager: [phone]   SUBJECTIVE   Chief Complaint: Shortness of breath  History of Present Illness: This is a 72 year old male with a past medical history of COPD, HFrEF with a EF of 40 to 45% on 06/18/2022, type 2 diabetes mellitus, recurrent syncope, and tobacco use disorder who presents to the emergency room with concerns of shortness of breath.  Patient reports that for the past 2 days he has been having shortness of breath, increased cough, and increased sputum production.  He states that usually he gets episodes of shortness of breath, and these episodes wake him up out of his sleep, and uses a nebulizer treatment which helps ease his symptoms.  He states that yesterday, he developed shortness of breath episode, and started a breathing treatment.  He states it did not help, and more nebulizer treatments.  He stated that he had to take 7-10 treatments to eventually ease his symptoms.  He states that he then went to watch TV, and his symptoms returned.  He states that he then decided to go to the emergency room.  Of note, he states he had a cold a few weeks ago, but denies any fevers or chills.  Patient denies any sick contacts.  He denies any chest pain or abdominal pain.  Denies any long car rides or long plane rides.  He denies wearing oxygen at home.  He does have a chronic dry cough, but now over the past few days he has been more productive and green.  He states he has not had a bowel movement in the past day.  He does report  having some vomiting a few days ago as well, but that has resolved.  ED Course: Patient presented to the emergency room via ambulance with a triage note stating he has taken 15 breathing treatments over the past 2 days.  Per EMS, patient had O2 saturation of 82%, and patient was given DuoNeb, 125 mg of Solu-Medrol, and 2 mg magnesium in route.  Patient also required CPAP in route.  On arrival to the emergency room, vital signs showed respiratory rate of 22, blood pressure 103/73, and immediately required BiPAP satting at 100% on room air.  Initial labs negative for flu, COVID, or RSV.  CBC within normal limits.  BMP showed slightly decreased sodium 134, creatinine elevated at 1.90.  Initial chest x-ray negative for any acute cardiopulmonary process.  Concern for COPD exacerbation requiring BiPAP, EDP consulted IMTS for admission.  Past Medical History COPD HF Tobacco use Recurrent syncope Type 2 diabetes mellitus  Meds:  Albuterol 2 puffs every 4 hours as needed  Aspirin 81 mg daily Atorvastatin 80 mg daily Bisoprolol 5 mg daily Breztri Aerosphere 2 puffs as needed Farxiga 10 mg daily Lasix 40 mg twice daily DuoNeb nebulizers Bidil 20-37.5 mg tablet 3 times daily Loratadine 10  mg daily Metformin 500 mg twice daily Singulair 10 mg nightly Nitroglycerin for chest pain Entresto 24-26 mg tablet twice daily Symbicort 2 puffs twice daily  Past Surgical History  Past Surgical History:  Procedure Laterality Date   LEG SURGERY     Right-sided as result of trauma   RIGHT/LEFT HEART CATH AND CORONARY ANGIOGRAPHY N/A 03/09/2022   Procedure: RIGHT/LEFT HEART CATH AND CORONARY ANGIOGRAPHY;  Surgeon: Runell Gess, MD;  Location: [facility];  Service: Cardiovascular;  Laterality: N/A;   Umbilicus surgery     As a child    Social:  Lives With: Daughter and grandchildren Occupation: Retired, prior maintenance  Support: Family in the area Level of Function: Able to perform  ADL/IADL PCP: [facility] Substances:Occasional alcohol use. Cigarette use 72 y/o started, 1/2 ppd. 2-3 days to finish a pack now. Denies other substance use  Family History: No pertinent family history  Allergies: Allergies as of 11/10/2022   (No Known Allergies)    Review of Systems: A complete ROS was negative except as per HPI.   OBJECTIVE:   Physical Exam: Blood pressure 112/69, pulse (!) 101, resp. rate (!) 25, height 5\' 11"  (1.803 m), weight 70.3 kg, SpO2 93 %.  Constitutional: Resting in bed, slightly tachypneic, with 5 L oxygen cannula HENT: normocephalic atraumatic Cardiovascular: regular rate and rhythm, no m/r/g Pulmonary/Chest: Diffuse wheeze noted throughout.  Decreased breath sounds. Abdominal: soft, non-tender, non-distended MSK: normal bulk and tone, no pitting edema appreciated  Labs: CBC    Component Value Date/Time   WBC 6.2 11/10/2022 2321   RBC 5.23 11/10/2022 2321   HGB 15.9 11/10/2022 2321   HCT 47.3 11/10/2022 2321   PLT 240 11/10/2022 2321   MCV 90.4 11/10/2022 2321   MCH 30.4 11/10/2022 2321   MCHC 33.6 11/10/2022 2321   RDW 13.4 11/10/2022 2321   LYMPHSABS 1.3 10/16/2022 1741   MONOABS 0.5 10/16/2022 1741   EOSABS 1.1 (H) 10/16/2022 1741   BASOSABS 0.1 10/16/2022 1741     CMP     Component Value Date/Time   NA 134 (L) 11/11/2022 0001   NA 139 04/16/2022 1409   K 3.6 11/11/2022 0001   CL 91 (L) 11/11/2022 0001   CO2 29 11/11/2022 0001   GLUCOSE 119 (H) 11/11/2022 0001   BUN 28 (H) 11/11/2022 0001   BUN 9 04/16/2022 1409   CREATININE 1.91 (H) 11/11/2022 0001   CALCIUM 9.2 11/11/2022 0001   PROT 7.6 08/14/2022 0520   ALBUMIN 3.9 08/14/2022 0520   AST 40 08/14/2022 0520   ALT 27 08/14/2022 0520   ALKPHOS 57 08/14/2022 0520   BILITOT 0.9 08/14/2022 0520   GFRNONAA 37 (L) 11/11/2022 0001   GFRAA  11/10/2022 2321    SPECIMEN HEMOLYZED. HEMOLYSIS MAY AFFECT INTEGRITY OF RESULTS.    Imaging:  Chest x-ray: No acute  cardiopulmonary process  EKG: personally reviewed my interpretation is sinus tachycardia with a rate of 102. Normal Axis. RBBB. Patient did have prolonged Qtc of 542.  When compared to previous EKG, QTc much prolonged on new EKG. No ST segment changes appreciated.  ASSESSMENT & PLAN:   Assessment & Plan by Problem: Principal Problem:   COPD exacerbation ([facility])   COLBURN ASPER is a 72 y.o. male with a past medical history of COPD, HFrEF, type 2 diabetes mellitus recurrent syncope who presents with concerns of shortness of breath.  Patient admitted for further evaluation and management of COPD exacerbation.  #Acute hypoxic respiratory  failure #COPD exacerbation Patient has a past medical history of COPD.  Home med includes Breztri and Symbicort.  Patient's most recent COPD exacerbation was back in 08/17/2022.  At that time patient also required BiPAP, and was treated with DuoNebs, Solu-Medrol, and also needed ceftriaxone.  Patient presents today with 2-day history of shortness of breath, increased sputum, and increased cough.  Patient was initially hypoxic, and required CPAP and was transitioned to BiPAP.  Patient since been transitioned off BiPAP, and on 5L nasal cannula.  On my exam, patient is slightly tachypneic, but looks comfortable.  There is diffuse expiratory wheezing noted on my exam.  Unclear what could have triggered this exacerbation, but patient does note that he does not consistently take his inhalers, but does try to. Continued smoking could have contributed to this exacerbation.  Less likely heart failure exacerbation given euvolemic on exam.  Less likely PE given no recent long travel history and denied any calf pain.  Another consideration can be on diagnosed and untreated OSA leading to nocturnal hypoxia causing his symptoms, as he states the symptoms awaken him from his sleep. He has had no sick contacts.  Imaging negative for any pneumonias.  Viral panel negative as well.  Will need  to treat with steroids.  Would like to treat with azithromycin, but given prolonged QTc, will avoid at this time. -Resume hospital equivalent of LAMA, LABA, and ICS -Start prednisone 40 mg daily for the next 5 days -Supplemental oxygen to keep SpO2 between 88 to 92% -Wean oxygen as tolerated -DuoNebs q4h -Continue to monitor respiratory status -Monitor fever curve -If patient's respiratory status becomes worse, consider adding antibiotic that will not prolong QTc  #AKI on CKD3A Patient has baseline creatinine of about 1.4-1.6.  Patient is elevated creatinine today at 1.91 concerning for AKI.  This is likely in the setting of dehydration. -Encourage PO intake  -Monitor BMP -Hold nephrotoxic agents -If does not improve, can get renal ultrasound and UA  #Hyponatremia Sodium 134 likely in the setting of dehydration. -Encourage p.o. intake  #HFrEF #Nonischemic cardiomyopathy  #NYHA class III  Patient has a past medical history of heart failure with reduced ejection fraction.  No acute concern for exacerbation at this time.  Exam seems euvolemic.  Patient's home regimen includes Lasix 40 mg twice daily, Farxiga 10 mg daily, Entresto 97-103 mg twice daily, bisoprolol 5 mg daily, and BiDil 1 tab 3 times daily.  Patient recently stopped taking spironolactone and potassium given patient had elevated potassium recently. -Hold Entresto 97-103 mg twice daily, in lieu of AKI  -Hold lasix 40 mg BID, In lieu AKI -Hold BiDil 20-37.5 mg tablet 3 times daily, resume if BP can tolerate  -Resume home Farxiga 10 mg daily -Resume bisoprolol 5 mg daily -Monitor volume status  #Type 2 diabetes mellitus Patient has a past medical history of diabetes, recently diagnosed back in May 2023.  Patient's most recent A1c on 03/07/2022 was 6.9. Glucose today 119. Patient was started on metformin 500 mg twice daily.  Unclear if patient is currently taking. -Sliding scale insulin -Repeat A1c -Hold metformin  inpatient -Continue Farxiga 10 mg daily  #Hypertension Patient with past medical history of hypertension etiology unclear, but likely primary.  Patient is scheduled for sleep study outpatient.  Patient's current GDMT medications for HFrEF also help with hypertension.  Current blood pressure measuring in the 130s to 140s. -Resume home GDMT except Entresto -Hold BilDil, resume if patient BP increases  -Monitor blood pressure  #CAD #  Hyperlipidemia Patient with past medical history of CAD and hyperlipidemia.  Nonobstructive CAD. Last cath on 03/09/2022 showing nonobstructive CAD.  No current concern for ACS. -Resume home atorvastatin 80 mg daily -Resume home aspirin 81 mg daily  #Recurrent syncope Patient has a history of recurrent syncope, with most recent episode of near syncope on 10/16/2022 in which patient came to emergency department.  Patient has been worked up in the past for the syncopal episodes, and these are likely vasovagal syncopal episodes.  No active concerns about this currently. -Monitor  #Tobacco use disorder Patient has past medical history of tobacco use disorder.  He has a longstanding history of tobacco use.  Encourage patient to quit, and patient is understanding about this.  Patient states he is trying to cut down.  He states he likes to have a cigarette with alcohol. -Nicotine patch offered -Encourage smoking cessation   Diet: Carb modified  VTE: Enoxaparin IVF: None  Code: Full  Prior to Admission Living Arrangement: Home, living family Anticipated Discharge Location: Home Barriers to Discharge: Clinical Improvement   Dispo: Admit patient to Observation with expected length of stay less than 2 midnights.  Signed: Leigh Aurora, DO Internal Medicine Resident PGY-1 Pager: [phone] 11/11/2022, 4:33 AM

## 2022-11-11 NOTE — Progress Notes (Signed)
Nicotine patch applied on R/upper arm per order at 1000AM

## 2022-11-11 NOTE — Progress Notes (Signed)
Patient's oxygen saturation remains between 88% to 92% with 5L [place]. Reduced to 4L at 1530pm.

## 2022-11-11 NOTE — Progress Notes (Addendum)
HD#0 Subjective:   Summary: Marcus Tran is a 72 year old male with PMH of COPD, HFrEF, T2DM, and recurrent syncope who presented with productive cough and dyspnea and is admitted for COPD exacerbation.  Overnight Events: None  Patient is stable this morning and has some abdominal gas that is mildly discomfort but his breathing is slightly improved overall.  Objective:  Vital signs in last 24 hours: Vitals:   11/11/22 0415 11/11/22 0427 11/11/22 0529 11/11/22 0624  BP: 133/83  (!) 142/83   Pulse: 92  93   Resp: (!) 28  (!) 26 20  Temp:  97.6 F (36.4 C) 98.1 F (36.7 C)   TempSrc:  Oral Oral   SpO2: 96%  96% 96%  Weight:      Height:       Supplemental O2: Nasal Cannula 4 L SpO2: 96 % FiO2 (%): 50 %   Physical Exam:  Constitutional: well-appearing elderly male sitting in bed, in no acute distress Cardiovascular: regular rate and rhythm, no m/r/g Pulmonary/Chest: normal work of breathing on room air, mild wheezing bilaterally Abdominal: soft, non-tender, non-distended MSK: normal bulk and tone, no lower extremity edema Neurological: alert & oriented x 3, no focal deficits Skin: warm and dry   Filed Weights   11/10/22 2317  Weight: 70.3 kg    No intake or output data in the 24 hours ending 11/11/22 0642 Net IO Since Admission: No IO data has been entered for this period [11/11/22 0642]  Pertinent Labs:    Latest Ref Rng & Units 11/10/2022   11:21 PM 10/16/2022    5:41 PM 08/15/2022    5:35 AM  CBC  WBC 4.0 - 10.5 K/uL 6.2  6.1  12.6   Hemoglobin 13.0 - 17.0 g/dL 15.9  15.4  14.6   Hematocrit 39.0 - 52.0 % 47.3  48.0  44.1   Platelets 150 - 400 K/uL 240  210  172        Latest Ref Rng & Units 11/11/2022   12:01 AM 11/10/2022   11:21 PM 10/26/2022    9:55 AM  CMP  Glucose 70 - 99 mg/dL 119  SPECIMEN HEMOLYZED. HEMOLYSIS MAY AFFECT INTEGRITY OF RESULTS.  97   BUN 8 - 23 mg/dL 28  SPECIMEN HEMOLYZED. HEMOLYSIS MAY AFFECT INTEGRITY OF RESULTS.  32    Creatinine 0.61 - 1.24 mg/dL 1.91  SPECIMEN HEMOLYZED. HEMOLYSIS MAY AFFECT INTEGRITY OF RESULTS.  1.58   Sodium 135 - 145 mmol/L 134  SPECIMEN HEMOLYZED. HEMOLYSIS MAY AFFECT INTEGRITY OF RESULTS.  131   Potassium 3.5 - 5.1 mmol/L 3.6  SPECIMEN HEMOLYZED. HEMOLYSIS MAY AFFECT INTEGRITY OF RESULTS.  4.0   Chloride 98 - 111 mmol/L 91  SPECIMEN HEMOLYZED. HEMOLYSIS MAY AFFECT INTEGRITY OF RESULTS.  96   CO2 22 - 32 mmol/L 29  SPECIMEN HEMOLYZED. HEMOLYSIS MAY AFFECT INTEGRITY OF RESULTS.  24   Calcium 8.9 - 10.3 mg/dL 9.2  SPECIMEN HEMOLYZED. HEMOLYSIS MAY AFFECT INTEGRITY OF RESULTS.  9.1     Imaging: DG Chest Port 1 View  Result Date: 11/10/2022 CLINICAL DATA:  Shortness of breath EXAM: PORTABLE CHEST 1 VIEW COMPARISON:  Chest x-ray 10/16/2022 FINDINGS: The heart size and mediastinal contours are within normal limits. Both lungs are clear. The visualized skeletal structures are unremarkable. IMPRESSION: No active disease. Electronically Signed   By: Ronney Asters M.D.   On: 11/10/2022 23:31    Assessment/Plan:   Principal Problem:   COPD exacerbation [facility])   Patient Summary:  Marcus Tran is a 72 year old male with PMH of COPD, HFrEF, T2DM, and recurrent syncope who presented with productive cough and dyspnea and is admitted for COPD exacerbation.   Acute hypoxemic respiratory failure COPD exacerbation Patient stable this morning and able to turn down oxygen to 4 L through nasal cannula.  He has not had any worsening of his cough or shortness of breath.  No fevers and no elevated white count.  We will continue to titrate his oxygen down and continue steroid therapy. - Incruse Ellipta, Dulera, DuoNeb, Singulair - Prednisone 40 mg, day 1/5 - Hold antibiotics, will start if becomes febrile or worsens, would avoid QT prolonging antibiotics including macrolides and fluoroquinolones - Wean oxygen as tolerated with SpO2 goal 88-92%  AKI on CKD 3A Metabolic acidosis Hyponatremia Baseline  creatinine 1.4-1.6.  Creatinine elevated from admission to 1.91 and down to 1.87 this morning.  Anion gap of 11 with a bicarb of 24 on arrival.  Anion gap now 17 with bicarb of 22.  Delta delta shows evidence of AG metabolic acidosis with concomitant metabolic alkalosis.  Most likely secondary to AKI and post hypercapnia.  He does appear euvolemic to slightly dry on exam. - Continue to encourage p.o. intake and will give 500 cc normal saline - Monitor ins and outs to ensure adequate urine output - If not improving in the morning we will get a UA and possibly renal ultrasound - AM BMP  HFrEF 2/2 NICM, NYHA class III stage C Hypertension CAD HLD In setting of AKI we are holding a good portion of his GDMT including Entresto, Lasix, and BiDil.  BNP 148 which is consistent with his prior values. - Continue bisoprolol 5 mg daily and Farxiga 10 mg daily - Continue atorvastatin 80 mg daily and aspirin 81 mg daily  T2DM Holding metformin in the setting of AKI. - SSI and Farxiga 10 mg daily - Repeat A1c pending  Recurrent syncope Likely vasovagal and stable without any recent recurrence.  Will monitor.  Tobacco use disorder Continued to counsel on tobacco cessation during admission. - Nicotine patch  Diet: Carb-Modified IVF: NS,100cc/hr for 5 hours VTE: Enoxaparin Code: Full   Dispo: Anticipated discharge to Home pending continued improvement in respiratory status and ability to maintain oxygen saturations on room air.  Johny Blamer, DO Internal Medicine Resident PGY-1 Pager: [phone]  Please contact the on call pager after 5 pm and on weekends at [phone].

## 2022-11-11 NOTE — ED Notes (Signed)
Pt taken off BiPap at this time per [facility] MD verbal order.

## 2022-11-11 NOTE — Progress Notes (Signed)
Patient has cough. He requests relief for that. MD made aware via secure chat. Incentive spirometry introduced with teach back .

## 2022-11-11 NOTE — Telephone Encounter (Signed)
Ms. Faux called me at 9:46 to let me know her dad was in the hospital.  Also inquired about him missing a court date yesterday and something about a warrant for his arrest.  I advised her to call the [facility] to let them know his reason for now show was hospitalization and they should direct her how for Mr. Netz to proceed.    Renee Ramus, [place] 11/11/2022

## 2022-11-12 ENCOUNTER — Other Ambulatory Visit (HOSPITAL_COMMUNITY): Payer: Self-pay

## 2022-11-12 DIAGNOSIS — I428 Other cardiomyopathies: Secondary | ICD-10-CM | POA: Diagnosis present

## 2022-11-12 DIAGNOSIS — Z7982 Long term (current) use of aspirin: Secondary | ICD-10-CM | POA: Diagnosis not present

## 2022-11-12 DIAGNOSIS — B970 Adenovirus as the cause of diseases classified elsewhere: Secondary | ICD-10-CM | POA: Diagnosis present

## 2022-11-12 DIAGNOSIS — J441 Chronic obstructive pulmonary disease with (acute) exacerbation: Secondary | ICD-10-CM | POA: Diagnosis not present

## 2022-11-12 DIAGNOSIS — E871 Hypo-osmolality and hyponatremia: Secondary | ICD-10-CM | POA: Diagnosis present

## 2022-11-12 DIAGNOSIS — Z79899 Other long term (current) drug therapy: Secondary | ICD-10-CM | POA: Diagnosis not present

## 2022-11-12 DIAGNOSIS — Z87891 Personal history of nicotine dependence: Secondary | ICD-10-CM

## 2022-11-12 DIAGNOSIS — Z7951 Long term (current) use of inhaled steroids: Secondary | ICD-10-CM | POA: Diagnosis not present

## 2022-11-12 DIAGNOSIS — E1122 Type 2 diabetes mellitus with diabetic chronic kidney disease: Secondary | ICD-10-CM | POA: Diagnosis present

## 2022-11-12 DIAGNOSIS — Z91148 Patient's other noncompliance with medication regimen for other reason: Secondary | ICD-10-CM | POA: Diagnosis not present

## 2022-11-12 DIAGNOSIS — E874 Mixed disorder of acid-base balance: Secondary | ICD-10-CM | POA: Diagnosis present

## 2022-11-12 DIAGNOSIS — F17210 Nicotine dependence, cigarettes, uncomplicated: Secondary | ICD-10-CM | POA: Diagnosis present

## 2022-11-12 DIAGNOSIS — E860 Dehydration: Secondary | ICD-10-CM | POA: Diagnosis present

## 2022-11-12 DIAGNOSIS — J9601 Acute respiratory failure with hypoxia: Secondary | ICD-10-CM | POA: Diagnosis present

## 2022-11-12 DIAGNOSIS — Z7984 Long term (current) use of oral hypoglycemic drugs: Secondary | ICD-10-CM | POA: Diagnosis not present

## 2022-11-12 DIAGNOSIS — I5022 Chronic systolic (congestive) heart failure: Secondary | ICD-10-CM | POA: Diagnosis present

## 2022-11-12 DIAGNOSIS — I130 Hypertensive heart and chronic kidney disease with heart failure and stage 1 through stage 4 chronic kidney disease, or unspecified chronic kidney disease: Secondary | ICD-10-CM | POA: Diagnosis present

## 2022-11-12 DIAGNOSIS — Z1152 Encounter for screening for COVID-19: Secondary | ICD-10-CM | POA: Diagnosis not present

## 2022-11-12 DIAGNOSIS — J120 Adenoviral pneumonia: Secondary | ICD-10-CM

## 2022-11-12 DIAGNOSIS — E785 Hyperlipidemia, unspecified: Secondary | ICD-10-CM | POA: Diagnosis present

## 2022-11-12 DIAGNOSIS — N1831 Chronic kidney disease, stage 3a: Secondary | ICD-10-CM | POA: Diagnosis present

## 2022-11-12 DIAGNOSIS — I2510 Atherosclerotic heart disease of native coronary artery without angina pectoris: Secondary | ICD-10-CM | POA: Diagnosis present

## 2022-11-12 DIAGNOSIS — N179 Acute kidney failure, unspecified: Secondary | ICD-10-CM | POA: Diagnosis present

## 2022-11-12 LAB — CBC
HCT: 42.8 % (ref 39.0–52.0)
Hemoglobin: 14 g/dL (ref 13.0–17.0)
MCH: 29.8 pg (ref 26.0–34.0)
MCHC: 32.7 g/dL (ref 30.0–36.0)
MCV: 91.1 fL (ref 80.0–100.0)
Platelets: 210 10*3/uL (ref 150–400)
RBC: 4.7 MIL/uL (ref 4.22–5.81)
RDW: 13.4 % (ref 11.5–15.5)
WBC: 11.9 10*3/uL — ABNORMAL HIGH (ref 4.0–10.5)
nRBC: 0 % (ref 0.0–0.2)

## 2022-11-12 LAB — BASIC METABOLIC PANEL
Anion gap: 8 (ref 5–15)
BUN: 32 mg/dL — ABNORMAL HIGH (ref 8–23)
CO2: 28 mmol/L (ref 22–32)
Calcium: 8.6 mg/dL — ABNORMAL LOW (ref 8.9–10.3)
Chloride: 97 mmol/L — ABNORMAL LOW (ref 98–111)
Creatinine, Ser: 1.52 mg/dL — ABNORMAL HIGH (ref 0.61–1.24)
GFR, Estimated: 48 mL/min — ABNORMAL LOW (ref >60)
Glucose, Bld: 138 mg/dL — ABNORMAL HIGH (ref 70–99)
Potassium: 3.5 mmol/L (ref 3.5–5.1)
Sodium: 133 mmol/L — ABNORMAL LOW (ref 135–145)

## 2022-11-12 LAB — GLUCOSE, CAPILLARY
Glucose-Capillary: 134 mg/dL — ABNORMAL HIGH (ref 70–99)
Glucose-Capillary: 122 mg/dL — ABNORMAL HIGH (ref 70–99)
Glucose-Capillary: 130 mg/dL — ABNORMAL HIGH (ref 70–99)

## 2022-11-12 MED ORDER — SACUBITRIL-VALSARTAN 24-26 MG PO TABS
1.0000 | ORAL_TABLET | Freq: Two times a day (BID) | ORAL | Status: DC
  Administered 2022-11-12: 1 via ORAL
  Filled 2022-11-12 (×2): qty 1

## 2022-11-12 MED ORDER — ISOSORB DINITRATE-HYDRALAZINE 20-37.5 MG PO TABS
1.0000 | ORAL_TABLET | Freq: Three times a day (TID) | ORAL | Status: DC
  Administered 2022-11-12: 1 via ORAL
  Filled 2022-11-12 (×3): qty 1

## 2022-11-12 MED ORDER — IPRATROPIUM-ALBUTEROL 0.5-2.5 (3) MG/3ML IN SOLN: 3.0000 mL | Freq: Four times a day (QID) | RESPIRATORY_TRACT | Status: DC | PRN

## 2022-11-12 MED ORDER — PREDNISONE 20 MG PO TABS
40.0000 mg | ORAL_TABLET | Freq: Every day | ORAL | 0 refills | Status: DC
  Filled 2022-11-12: qty 3, 1d supply, fill #0

## 2022-11-12 MED ORDER — IPRATROPIUM-ALBUTEROL 0.5-2.5 (3) MG/3ML IN SOLN
3.0000 mL | Freq: Two times a day (BID) | RESPIRATORY_TRACT | Status: DC
  Administered 2022-11-12: 3 mL via RESPIRATORY_TRACT
  Filled 2022-11-12 (×2): qty 3

## 2022-11-12 MED ORDER — GUAIFENESIN-DM 100-10 MG/5ML PO SYRP
5.0000 mL | ORAL_SOLUTION | ORAL | 0 refills | Status: DC | PRN
  Filled 2022-11-12: qty 118, 4d supply, fill #0

## 2022-11-12 MED ORDER — NICOTINE 21 MG/24HR TD PT24
21.0000 mg | MEDICATED_PATCH | Freq: Every day | TRANSDERMAL | 3 refills | Status: DC
  Filled 2022-11-12: qty 28, 28d supply, fill #0

## 2022-11-12 NOTE — Discharge Instructions (Addendum)
Marcus Tran,  You were recently admitted to [facility] for COPD Exacerbation.   Continue taking your home medications with the following changes  Start taking Prednisone 40 mg daily for 3 more days  Stop taking Continue taking   You should seek further medical care if you experience any worsening shortness of breath, fevers, worsening cough, or any other bothersome issues..  We recommend that you see your primary care doctor in about a week to make sure that you continue to improve. We are so glad that you are feeling better.  Sincerely, Marcus Blamer, DO

## 2022-11-12 NOTE — Discharge Summary (Signed)
Name: Marcus Tran MRN: [id number] DOB: 1951-03-16 72 y.o. PCP: Marcus Mile, NP  Date of Admission: 11/10/2022 11:09 PM Date of Discharge: 11/12/2022 Attending Physician: Dr.  Johnnye Sima  Discharge Diagnosis: Principal Problem:   COPD exacerbation [facility]) Active Problems:   Tobacco use disorder    Discharge Medications: Allergies as of 11/12/2022   No Known Allergies      Medication List     TAKE these medications    albuterol 108 (90 Base) MCG/ACT inhaler Commonly known as: VENTOLIN HFA Inhale 2 puffs into the lungs every 4 (four) hours as needed for wheezing or shortness of breath.   aspirin EC 81 MG tablet Take 1 tablet (81 mg total) by mouth daily. Swallow whole.   atorvastatin 80 MG tablet Commonly known as: LIPITOR Take 1 tablet (80 mg total) by mouth every evening.   bisoprolol 5 MG tablet Commonly known as: ZEBETA TAKE 1 Tablet BY MOUTH ONCE DAILY   Breztri Aerosphere 160-9-4.8 MCG/ACT Aero Generic drug: Budeson-Glycopyrrol-Formoterol Inhale 2 puffs into the lungs as needed.   Entresto 24-26 MG Generic drug: sacubitril-valsartan Take 1 tablet by mouth 2 (two) times daily.   Farxiga 10 MG Tabs tablet Generic drug: dapagliflozin propanediol TAKE 1 Tablet BY MOUTH ONCE DAILY   furosemide 40 MG tablet Commonly known as: LASIX TAKE 1 Tablet BY MOUTH TWICE DAILY   guaiFENesin-dextromethorphan 100-10 MG/5ML syrup Commonly known as: ROBITUSSIN DM Take 5 mLs by mouth every 4 (four) hours as needed for cough.   ipratropium-albuterol 0.5-2.5 (3) MG/3ML Soln Commonly known as: DUONEB Inhale 3 mLs into the lungs in the morning, at noon, in the evening, and at bedtime.   isosorbide-hydrALAZINE 20-37.5 MG tablet Commonly known as: BiDil Take 1 tablet by mouth 3 (three) times daily.   metFORMIN 500 MG tablet Commonly known as: GLUCOPHAGE Take 1 tablet (500 mg total) by mouth 2 (two) times daily with a meal.   montelukast 10 MG tablet Commonly known as:  SINGULAIR Take 10 mg by mouth at bedtime.   nicotine 21 mg/24hr patch Commonly known as: NICODERM CQ - dosed in mg/24 hours Place 1 patch (21 mg total) onto the skin daily. Start taking on: November 13, 2022   nitroGLYCERIN 0.4 MG SL tablet Commonly known as: NITROSTAT Place 1 tablet (0.4 mg total) under the tongue every 5 (five) minutes as needed for chest pain.   predniSONE 20 MG tablet Commonly known as: DELTASONE Take 2 tablets (40 mg total) by mouth daily with breakfast. Start taking on: November 13, 2022   spironolactone 25 MG tablet Commonly known as: ALDACTONE Take 12.5 mg by mouth daily.        Disposition and follow-up:   Mr.Marcus Tran was discharged from [facility] in Good condition.  At the hospital follow up visit please address:  1.  Follow-up:  a.  Ensure completion of prednisone and no recurrence of symptoms.  He was discharged on room air.  If he does develop fever or gets worse we would recommend starting antibiotic therapy but he did have a prolonged QT here.  Would recommend avoiding macrolides and fluoroquinolones.    b.  Recheck renal function with recent AKI to ensure that he is steady at his baseline after we restarted his GDMT prior to discharge.   c.  Continue to encourage tobacco cessation.  He was given nicotine patches on discharge.  2.  Labs / imaging needed at time of follow-up: BMP  3.  Pending labs/  test needing follow-up: None  Follow-up Appointments:   Hospital Course by problem list: Marcus Tran is a 72 year old male with PMH of COPD, HFrEF, T2DM, and recurrent syncope who presented with productive cough and dyspnea and is admitted for COPD exacerbation.     Acute hypoxemic respiratory failure COPD exacerbation Patient presented with a COPD exacerbation secondary to intermittent medication nonadherence, continued tobacco use, and complicated by adenovirus.  On arrival he was requiring BiPAP but was quickly decreased  to 5 L nasal cannula.  He was started on prednisone for a 5-day course as well as his home inhalers and as needed nebulizers.  Chest x-ray was unremarkable and he had no fever and no white count so there were no antibiotics started.  On day 3 of his admission he was stable on room air.  He was discharged with 3 more days of prednisone and will follow-up with his primary care.   AKI on CKD 3A Metabolic acidosis Hyponatremia Baseline creatinine 1.4-1.6.  Creatinine elevated from admission to 1.91 and down to 1.52 after IV fluids and increased p.o. intake.  Anion gap of 11 with a bicarb of 24 on arrival which closed after treatment with fluids.  Delta delta showed evidence of AG metabolic acidosis with concomitant metabolic alkalosis.  Most likely secondary to AKI and post hypercapnia.   Prolonged QT EKG here showed a QT of 542.  QT prolonging medications were avoided.   HFrEF 2/2 NICM, NYHA class III stage C Hypertension CAD HLD In setting of AKI we held a good portion of his GDMT including Entresto, Lasix, and BiDil.  BNP 148 which is consistent with his prior values.  He was continued on bisoprolol, Farxiga, atorvastatin, and aspirin.  When his AKI resolved he was restarted on the rest of his GDMT. - Continue bisoprolol 5 mg daily and Farxiga 10 mg daily - Continue atorvastatin 80 mg daily and aspirin 81 mg daily   T2DM Initially held metformin in the setting of AKI and continued him on Farxiga as well as sliding scale insulin.  A1c was 6.8.  Metformin was restarted prior to discharge.    Recurrent syncope Likely vasovagal and stable without any recent recurrence.  No recurrence during admission.   Tobacco use disorder Continued to counsel on tobacco cessation during admission.  He was given nicotine patches during admission and on discharge and was encouraged to talk to his PCP about continued support.   Discharge Subjective: Patient was doing very well this morning and we were able to  take off his supplemental oxygen without issue.  He has no new or worsening complaints and is excited to the prospect of being discharged today.  Discharge Exam:   BP (!) 129/90 (BP Location: Left Wrist)   Pulse 74   Temp 98.3 F (36.8 C) (Oral)   Resp 18   Ht 5\' 11"  (1.803 m)   Wt 70.3 kg   SpO2 95%   BMI 21.62 kg/m  Constitutional: well-appearing elderly male sitting in bed, in no acute distress Cardiovascular: regular rate and rhythm, no m/r/g Pulmonary/Chest: normal work of breathing on room air, mild wheezing bilaterally Abdominal: soft, non-tender, non-distended MSK: normal bulk and tone, no lower extremity edema Neurological: alert & oriented x 3, no focal deficits Skin: warm and dry  Pertinent Labs, Studies, and Procedures:     Latest Ref Rng & Units 11/12/2022    6:43 AM 11/10/2022   11:21 PM 10/16/2022    5:41 PM  CBC  WBC 4.0 -  10.5 K/uL 11.9  6.2  6.1   Hemoglobin 13.0 - 17.0 g/dL 51.0  25.8  52.7   Hematocrit 39.0 - 52.0 % 42.8  47.3  48.0   Platelets 150 - 400 K/uL 210  240  210        Latest Ref Rng & Units 11/12/2022    6:43 AM 11/11/2022    7:35 AM 11/11/2022   12:01 AM  CMP  Glucose 70 - 99 mg/dL 782  423  536   BUN 8 - 23 mg/dL 32  34  28   Creatinine 0.61 - 1.24 mg/dL 1.44  3.15  4.00   Sodium 135 - 145 mmol/L 133  130  134   Potassium 3.5 - 5.1 mmol/L 3.5  3.8  3.6   Chloride 98 - 111 mmol/L 97  91  91   CO2 22 - 32 mmol/L 28  22  29    Calcium 8.9 - 10.3 mg/dL 8.6  9.1  9.2     DG Chest Port 1 View  Result Date: 11/10/2022 CLINICAL DATA:  Shortness of breath EXAM: PORTABLE CHEST 1 VIEW COMPARISON:  Chest x-ray 10/16/2022 FINDINGS: The heart size and mediastinal contours are within normal limits. Both lungs are clear. The visualized skeletal structures are unremarkable. IMPRESSION: No active disease. Electronically Signed   By: 10/18/2022 M.D.   On: 11/10/2022 23:31     Discharge Instructions: Discharge Instructions     Call MD for:   difficulty breathing, headache or visual disturbances   Complete by: As directed    Call MD for:  extreme fatigue   Complete by: As directed    Call MD for:  persistant dizziness or light-headedness   Complete by: As directed    Call MD for:  persistant nausea and vomiting   Complete by: As directed    Call MD for:  severe uncontrolled pain   Complete by: As directed    Call MD for:  temperature >100.4   Complete by: As directed    Diet - low sodium heart healthy   Complete by: As directed    Increase activity slowly   Complete by: As directed        Signed: 11/12/2022, DO 11/12/2022, 3:01 PM   Pager: [phone]

## 2022-11-12 NOTE — Progress Notes (Signed)
Patients IV is removed and AVS instructions given at this time. Medications were picked up from pharmacy and [facility] voucher was given to be transported home.

## 2022-11-12 NOTE — Plan of Care (Signed)

## 2022-11-13 ENCOUNTER — Telehealth (HOSPITAL_COMMUNITY): Payer: Self-pay | Admitting: Licensed Clinical Social Worker

## 2022-11-13 ENCOUNTER — Other Ambulatory Visit (HOSPITAL_COMMUNITY): Payer: Self-pay

## 2022-11-13 ENCOUNTER — Other Ambulatory Visit (HOSPITAL_COMMUNITY): Payer: Self-pay | Admitting: Emergency Medicine

## 2022-11-13 NOTE — Telephone Encounter (Signed)
CSW informed by paramedic that there might be concerns with pt heat not working.  CSW reached out to assess concerns and offer assistance as able- unable to reach- left VM requesting return call  Jorge Ny, [facility] Desk#: [phone] Cell#: [phone]'

## 2022-11-13 NOTE — Progress Notes (Signed)
Paramedicine Encounter    Patient ID: Marcus Tran, male    DOB: 09/24/1951, 72 y.o.   MRN: [id number]   Complaints no complaints  Assessment No chest pain or SOB, no edema, clear lung sounds   Compliance with meds YES  Pill box filled x 1 week  Refills needed none  Meds changes since last visit: Prednisone, Robitusson    Social changes none   BP 122/80 (BP Location: Right Arm, Patient Position: Sitting, Cuff Size: Normal)   Pulse 77   Resp 16   Wt 160 lb 9.6 oz (72.8 kg)   SpO2 94%   BMI 22.40 kg/m  Weight yesterday-not taken Last visit weight-153lb   ATF Mr. Kozakiewicz A&O x 4, skin W&D w/ good color.  He denies chest pain or SOB.  He was just hospitalized from 1/23-1/26 for COPD exacerbation.  He was sent home with Prednisone and they only sent him home with 3 pills and I reached out to [facility] and they advised that the doctor had made a mistake in the qty of pills ACTION: Home visit completed  [name] [phone] 11/13/22  Patient Care Team: Cipriano Mile, NP as PCP - General Minus Breeding, MD as PCP - Cardiology (Cardiology)  Patient Active Problem List   Diagnosis Date Noted  . Adenovirus pneumonia 11/12/2022  . COPD exacerbation ([place]) 08/13/2022  . Chronic HFrEF (heart failure with reduced ejection fraction) ([place]) 08/13/2022  . DM2 (diabetes mellitus, type 2) ([place]) 08/13/2022  . Obstructive sleep apnea (adult) (pediatric) 08/08/2022  . Acute respiratory failure with hypoxia ([place]) 05/04/2022  . Third degree heart block ([place]) 04/15/2022  . Snoring 04/15/2022  . Nonischemic cardiomyopathy ([place]) 04/15/2022  . HFrEF (heart failure with reduced ejection fraction) ([place]) 04/15/2022  . COPD with acute exacerbation ([place]) 03/22/2022  . Acute on chronic combined systolic and diastolic CHF (congestive heart failure) ([place])   . Tobacco use disorder 03/08/2022  . 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 HTN (hypertension) 03/08/2022  .  Stage 3a chronic kidney disease (CKD) ([place]) 03/08/2022  . Coronary artery disease 03/08/2022    Current Outpatient Medications:  .  albuterol (VENTOLIN HFA) 108 (90 Base) MCG/ACT inhaler, Inhale 2 puffs into the lungs every 4 (four) hours as needed for wheezing or shortness of breath., Disp: 1 each, Rfl: 11 .  aspirin EC 81 MG tablet, Take 1 tablet (81 mg total) by mouth daily. Swallow whole., Disp: 90 tablet, Rfl: 3 .  atorvastatin (LIPITOR) 80 MG tablet, Take 1 tablet (80 mg total) by mouth every evening., Disp: 90 tablet, Rfl: 3 .  bisoprolol (ZEBETA) 5 MG tablet, TAKE 1 Tablet BY MOUTH ONCE DAILY, Disp: 30 tablet, Rfl: 6 .  Budeson-Glycopyrrol-Formoterol (BREZTRI AEROSPHERE) 160-9-4.8 MCG/ACT AERO, Inhale 2 puffs into the lungs as needed., Disp: 10.6 g, Rfl: 11 .  FARXIGA 10 MG TABS tablet, TAKE 1 Tablet BY MOUTH ONCE DAILY, Disp: 30 tablet, Rfl: 5 .  furosemide (LASIX) 40 MG tablet, TAKE 1 Tablet BY MOUTH TWICE DAILY, Disp: 90 tablet, Rfl: 3 .  guaiFENesin-dextromethorphan (ROBITUSSIN DM) 100-10 MG/5ML syrup, Take 5 mLs by mouth every 4 (four) hours as needed for cough., Disp: 118 mL, Rfl: 0 .  ipratropium-albuterol (DUONEB) 0.5-2.5 (3) MG/3ML SOLN, Inhale 3 mLs into the lungs in the morning, at noon, in the evening, and at bedtime., Disp: 360 mL, Rfl: 11 .  isosorbide-hydrALAZINE (BIDIL) 20-37.5 MG tablet, Take 1 tablet by mouth 3 (three) times daily., Disp: 90 tablet, Rfl: 3 .  metFORMIN (GLUCOPHAGE) 500 MG tablet, Take 1 tablet (500 mg total) by mouth 2 (two) times daily with a meal., Disp: 60 tablet, Rfl: 0 .  montelukast (SINGULAIR) 10 MG tablet, Take 10 mg by mouth at bedtime., Disp: , Rfl:  .  sacubitril-valsartan (ENTRESTO) 24-26 MG, Take 1 tablet by mouth 2 (two) times daily., Disp: 90 tablet, Rfl: 1 .  spironolactone (ALDACTONE) 25 MG tablet, Take 12.5 mg by mouth daily., Disp: , Rfl:  .  nicotine (NICODERM CQ - DOSED IN MG/24 HOURS) 21 mg/24hr patch, Place 1 patch (21 mg total)  onto the skin daily. (Patient not taking: Reported on 11/13/2022), Disp: 28 patch, Rfl: 3 .  nitroGLYCERIN (NITROSTAT) 0.4 MG SL tablet, Place 1 tablet (0.4 mg total) under the tongue every 5 (five) minutes as needed for chest pain. (Patient not taking: Reported on 11/13/2022), Disp: 25 tablet, Rfl: 12 .  predniSONE (DELTASONE) 20 MG tablet, Take 2 tablets (40 mg total) by mouth daily with breakfast., Disp: 3 tablet, Rfl: 0 No Known Allergies   Social History   Socioeconomic History  . Marital status: Single    Spouse name: Not on file  . Number of children: 1  . Years of education: Not on file  . Highest education level: 10th grade  Occupational History  . Occupation: Disability  Tobacco Use  . Smoking status: Former    Packs/day: 0.50    Years: 50.00    Total pack years: 25.00    Types: Cigarettes    Quit date: 03/19/2022    Years since quitting: 0.6  . Smokeless tobacco: Never  . Tobacco comments:    Smoking cessation  Vaping Use  . Vaping Use: Never used  Substance and Sexual Activity  . Alcohol use: Not on file    Comment: socially  . Drug use: Yes    Types: Marijuana    Comment: past  . Sexual activity: Not on file  Other Topics Concern  . Not on file  Social History Narrative   He lives with his daughter apparently and 5 grandchildren.  He reports that he smokes probably less than a pack of cigarettes a day.  He occasionally drinks a beer or liquor but not daily.   Social Determinants of Health   Financial Resource Strain: Low Risk  (03/11/2022)   Overall Financial Resource Strain (CARDIA)   . Difficulty of Paying Living Expenses: Not very hard  Food Insecurity: No Food Insecurity (11/11/2022)   Hunger Vital Sign   . Worried About Programme researcher, broadcasting/film/video in the Last Year: Never true   . Ran Out of Food in the Last Year: Never true  Recent Concern: Food Insecurity - Food Insecurity Present (10/26/2022)   Hunger Vital Sign   . Worried About Programme researcher, broadcasting/film/video in the Last  Year: Often true   . Ran Out of Food in the Last Year: Often true  Transportation Needs: No Transportation Needs (11/11/2022)   PRAPARE - Transportation   . Lack of Transportation (Medical): No   . Lack of Transportation (Non-Medical): No  Recent Concern: Transportation Needs - Unmet Transportation Needs (10/26/2022)   PRAPARE - Transportation   . Lack of Transportation (Medical): Yes   . Lack of Transportation (Non-Medical): Yes  Physical Activity: Not on file  Stress: Not on file  Social Connections: Not on file  Intimate Partner Violence: Not At Risk (11/11/2022)   Humiliation, Afraid, Rape, and Kick questionnaire   . Fear of Current or Ex-Partner: No   .  Emotionally Abused: No   . Physically Abused: No   . Sexually Abused: No    Physical Exam      Future Appointments  Date Time Provider [place]  11/23/2022 11:30 AM [facility] PA/NP [facility] None  12/31/2022 11:30 AM Evans Lance, MD [id number]

## 2022-11-18 ENCOUNTER — Telehealth (HOSPITAL_COMMUNITY): Payer: Self-pay | Admitting: Licensed Clinical Social Worker

## 2022-11-18 ENCOUNTER — Other Ambulatory Visit (HOSPITAL_COMMUNITY): Payer: Self-pay | Admitting: Emergency Medicine

## 2022-11-18 NOTE — Telephone Encounter (Signed)
11/18/2022  [name] DOB: 06-19-1951 MRN: [id number]   RIDER WAIVER AND RELEASE OF LIABILITY  For the purposes of helping with transportation needs, [facility] partners with outside transportation providers (taxi companies, [place], Social research officer, government.) to give [employer] patients or other approved people the choice of on-demand rides [employer]") to our buildings for non-emergency visits.  By using [employer], I, the person signing this document, on behalf of myself and/or any legal minors (in my care using the [employer]), agree:  Government social research officer given to me are supplied by independent, outside transportation providers who do not work for, or have any affiliation with, [employer]. [place] is not a transportation company. [place] has no control over the quality or safety of the rides I get using [employer]. [place] has no control over whether any outside ride will happen on time or not. [facility] [place] gives no guarantee on the reliability, quality, safety, or availability on any rides, or that no mistakes will happen. I know and accept that traveling by vehicle (car, truck, SVU, Lucianne Lei, bus, taxi, etc.) has risks of serious injuries such as disability, being paralyzed, and death. I know and agree the risk of using [employer] is mine alone, and not [employer]. Transport Services are provided "as is" and as are available. The transportation providers are in charge for all inspections and care of the vehicles used to provide these rides. I agree not to take legal action against [facility], its agents, employees, officers, directors, representatives, insurers, attorneys, assigns, successors, subsidiaries, and affiliates at any time for any reasons related directly or indirectly to using [employer]. I also agree not to take legal action against [place] or its affiliates for any injury, death, or damage to property caused by or related to using  [employer]. I have read this Waiver and Release of Liability, and I understand the terms used in it and their legal meaning. This Waiver is freely and voluntarily given with the understanding that my right (or any legal minors) to legal action against [place] relating to [employer] is knowingly given up to use these services.   I attest that I read the Ride Waiver and Release of Liability to [name], gave Mr. Barthelemy the opportunity to ask questions and answered the questions asked (if any). I affirm that [name] then provided consent for assistance with transportation.     Jorge Ny

## 2022-11-18 NOTE — Progress Notes (Signed)
Paramedicine Encounter    Patient ID: Marcus Tran, male    DOB: 05/23/1951, 72 y.o.   MRN: [id number]   Complaints Congested cough  Assessment No chest pain, SOB.  Lung sounds clear throughout  Compliance with meds Compliant with all meds  Pill box filled  n/a  Refills needed none  Meds changes since last visit NONE   Social changes None   BP (!) 120/90 (BP Location: Right Arm, Patient Position: Standing, Cuff Size: Normal)   Pulse 92   Resp 16   Wt 151 lb 3.2 oz (68.6 kg)   SpO2 94%   BMI 21.09 kg/m  Weight yesterday-not taken Last visit weight-160lb  Picked up food box from [place] and delivered to [name].  Pt denies chest pain or SOB.  Lung sounds clear and equal bilat.  Med box was reconciled for 2 weeks last visit so no need to reconcile today.   ACTION: Home visit completed  [name] [phone] 11/18/22  Patient Care Team: Cipriano Mile, NP as PCP - Cheral Bay, MD as PCP - Cardiology (Cardiology)  Patient Active Problem List   Diagnosis Date Noted   Adenovirus pneumonia 11/12/2022   COPD exacerbation ([place]) 08/13/2022   Chronic HFrEF (heart failure with reduced ejection fraction) ([place]) 08/13/2022   DM2 (diabetes mellitus, type 2) ([place]) 08/13/2022   Obstructive sleep apnea (adult) (pediatric) 08/08/2022   Acute respiratory failure with hypoxia ([place]) 05/04/2022   Third degree heart block ([place]) 04/15/2022   Snoring 04/15/2022   Nonischemic cardiomyopathy ([place]) 04/15/2022   HFrEF (heart failure with reduced ejection fraction) ([place]) 04/15/2022   COPD with acute exacerbation ([place]) 03/22/2022   Acute on chronic combined systolic and diastolic CHF (congestive heart failure) ([place])    Tobacco use disorder 03/08/2022   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   HTN (hypertension) 03/08/2022   Stage 3a chronic kidney disease (CKD) ([place]) 03/08/2022   Coronary artery disease 03/08/2022    Current Outpatient  Medications:    albuterol (VENTOLIN HFA) 108 (90 Base) MCG/ACT inhaler, Inhale 2 puffs into the lungs every 4 (four) hours as needed for wheezing or shortness of breath., Disp: 1 each, Rfl: 11   aspirin EC 81 MG tablet, Take 1 tablet (81 mg total) by mouth daily. Swallow whole., Disp: 90 tablet, Rfl: 3   atorvastatin (LIPITOR) 80 MG tablet, Take 1 tablet (80 mg total) by mouth every evening., Disp: 90 tablet, Rfl: 3   bisoprolol (ZEBETA) 5 MG tablet, TAKE 1 Tablet BY MOUTH ONCE DAILY, Disp: 30 tablet, Rfl: 6   Budeson-Glycopyrrol-Formoterol (BREZTRI AEROSPHERE) 160-9-4.8 MCG/ACT AERO, Inhale 2 puffs into the lungs as needed., Disp: 10.6 g, Rfl: 11   FARXIGA 10 MG TABS tablet, TAKE 1 Tablet BY MOUTH ONCE DAILY, Disp: 30 tablet, Rfl: 5   furosemide (LASIX) 40 MG tablet, TAKE 1 Tablet BY MOUTH TWICE DAILY, Disp: 90 tablet, Rfl: 3   guaiFENesin-dextromethorphan (ROBITUSSIN DM) 100-10 MG/5ML syrup, Take 5 mLs by mouth every 4 (four) hours as needed for cough., Disp: 118 mL, Rfl: 0   ipratropium-albuterol (DUONEB) 0.5-2.5 (3) MG/3ML SOLN, Inhale 3 mLs into the lungs in the morning, at noon, in the evening, and at bedtime., Disp: 360 mL, Rfl: 11   isosorbide-hydrALAZINE (BIDIL) 20-37.5 MG tablet, Take 1 tablet by mouth 3 (three) times daily., Disp: 90 tablet, Rfl: 3   metFORMIN (GLUCOPHAGE) 500 MG tablet, Take 1 tablet (500 mg total) by mouth 2 (two) times daily with a meal.,  Disp: 60 tablet, Rfl: 0   montelukast (SINGULAIR) 10 MG tablet, Take 10 mg by mouth at bedtime., Disp: , Rfl:    nicotine (NICODERM CQ - DOSED IN MG/24 HOURS) 21 mg/24hr patch, Place 1 patch (21 mg total) onto the skin daily. (Patient not taking: Reported on 11/13/2022), Disp: 28 patch, Rfl: 3   nitroGLYCERIN (NITROSTAT) 0.4 MG SL tablet, Place 1 tablet (0.4 mg total) under the tongue every 5 (five) minutes as needed for chest pain. (Patient not taking: Reported on 11/13/2022), Disp: 25 tablet, Rfl: 12   predniSONE (DELTASONE) 20 MG  tablet, Take 2 tablets (40 mg total) by mouth daily with breakfast., Disp: 3 tablet, Rfl: 0   sacubitril-valsartan (ENTRESTO) 24-26 MG, Take 1 tablet by mouth 2 (two) times daily., Disp: 90 tablet, Rfl: 1   spironolactone (ALDACTONE) 25 MG tablet, Take 12.5 mg by mouth daily., Disp: , Rfl:  No Known Allergies   Social History   Socioeconomic History   Marital status: Single    Spouse name: Not on file   Number of children: 1   Years of education: Not on file   Highest education level: 10th grade  Occupational History   Occupation: Disability  Tobacco Use   Smoking status: Former    Packs/day: 0.50    Years: 50.00    Total pack years: 25.00    Types: Cigarettes    Quit date: 03/19/2022    Years since quitting: 0.6   Smokeless tobacco: Never   Tobacco comments:    Smoking cessation  Vaping Use   Vaping Use: Never used  Substance and Sexual Activity   Alcohol use: Not on file    Comment: socially   Drug use: Yes    Types: Marijuana    Comment: past   Sexual activity: Not on file  Other Topics Concern   Not on file  Social History Narrative   He lives with his daughter apparently and 5 grandchildren.  He reports that he smokes probably less than a pack of cigarettes a day.  He occasionally drinks a beer or liquor but not daily.   Social Determinants of Health   Financial Resource Strain: Low Risk  (03/11/2022)   Overall Financial Resource Strain (CARDIA)    Difficulty of Paying Living Expenses: Not very hard  Food Insecurity: No Food Insecurity (11/11/2022)   Hunger Vital Sign    Worried About Running Out of Food in the Last Year: Never true    Ran Out of Food in the Last Year: Never true  Recent Concern: Food Insecurity - Food Insecurity Present (10/26/2022)   Hunger Vital Sign    Worried About Running Out of Food in the Last Year: Often true    Ran Out of Food in the Last Year: Often true  Transportation Needs: Unmet Transportation Needs (11/18/2022)   PRAPARE -  Hydrologist (Medical): Yes    Lack of Transportation (Non-Medical): Yes  Physical Activity: Not on file  Stress: Not on file  Social Connections: Not on file  Intimate Partner Violence: Not At Risk (11/11/2022)   Humiliation, Afraid, Rape, and Kick questionnaire    Fear of Current or Ex-Partner: No    Emotionally Abused: No    Physically Abused: No    Sexually Abused: No    Physical Exam      Future Appointments  Date Time Provider [place]  11/23/2022 11:30 AM [facility] PA/NP [facility] None  12/31/2022 11:30 AM Evans Lance,  MD [facility]

## 2022-11-18 NOTE — Telephone Encounter (Signed)
H&V Care Navigation CSW Progress Note  Clinical Social Worker consulted to help with ride to clinic appt on 2/5.  Pt has medicaid but it is too close to appt to arrange ride in this manner.  CSW able to arrange taxi to get to appt- pt dtr informed of ride details as pt does not have working phone at this time.   SDOH Screenings   Food Insecurity: No Food Insecurity (11/11/2022)  Recent Concern: Food Insecurity - Food Insecurity Present (10/26/2022)  Housing: Low Risk  (11/11/2022)  Transportation Needs: Unmet Transportation Needs (11/18/2022)  Utilities: Not At Risk (11/11/2022)  Alcohol Screen: Low Risk  (05/06/2022)  Financial Resource Strain: Low Risk  (03/11/2022)  Tobacco Use: Medium Risk (11/11/2022)      Jorge Ny, [facility] Desk#: [phone] Cell#: [phone]

## 2022-11-20 NOTE — Progress Notes (Signed)
ADVANCED HF CLINIC NOTE  Primary Care: Cipriano Mile, NP [facility] PCP  Primary Cardiologist: Minus Breeding, MD  [facility]: Assigned to Dr. Aundra Dubin   HPI: 72 y.o. [name] with hx tobacco use, COPD, OSA on Bipap, HTN,  chronic HFmEF.   Admitted 03/06/2022 with acute hypoxic respiratory failure secondary to suspected COPD and acute systolic CHF.  Initially in respiratory distress and required BiPAP. BP significantly elevated at 189/133, remained elevated throughout much of admission but improved with titration of GDMT. CTA chest negative for PE. Echo EF 35%, RV okay, RVSP 54 mmhg. R/LHC: nonobstructive CAD, RA mean 12, PA 54/29 (37), PCWP mean 23, LVEDP 22 mmHg, Fick CO/CI 3.84/2.12. He diuresed with IV lasix. Discharged home on 03/11/22, weight 05/23 163 lb.    He was readmitted 06/02-06/04/23 with acute on chronic respiratory failure with hypoxia secondary to acute COPD exacerbation. Also thought to possibly have some component of a/c CHF. He diuresed with IV lasix and received doxycycline + prednisone. Discharge weight 151 lb.    Seen in [facility] clinic on 03/23/2022 . Volume looked good. Added entresto and cut back lasix. He was referred back to Cardiology. 14 day zio ordered at cardiology f/u - rhythm was sinus with short runs NSVT and SVT, bradyarrhythmias, idioventricular rhythm, with episodes of third-degree HB noted during sleep hours.   Saw Dr. Percival Spanish for f/u 04/16/22. Bradycardia thought to be possibly d/t untreated OSA. Sleep evaluation recommended before considering a device. Sleep study ordered. He was having more shortness of breath and cough. Volume looked good. He was referred to pulmonary and was treated the same day for suspected COPD exacerbation. Pulmonary ordered PFTs.   He was admitted 05/04/22 with acute respiratory failure with hypoxia. O2 sats 80s. He initially required BiPAP. Hypertensive with SBP 210/120 and placed on nitro gtt. He was admitted for AECOPD and a/c CHF.  Diuresed  with IV lasix. Cardiology consulted and felt presentation more likely consistent with AECOPD rather than CHF. Had runs of NSVT up to 20 beats in duration.    Referred to [facility] clinic for post hospital. Was fitted for LifeVest and GDMT adjusted. Referred to the AHF clinic for further management.   05/2022 EF improved 40-45%  with LVH. Recommended PYP/ CMRI  ED evaluation 10/16/22 for syncope. Low suspicion for PE. CXR ok. COVID negative. K 5.5. Suspected vasovagal episode during coughing fit. He instructed to take OTC for URI.   Today he returns for HF follow up.Overall feeling better. Denies  syncope. Having some shortness of breath with exertion but seems to be his baseline. Denies PND/Orthopnea. Ongoing productive cough. Appetite ok. He has difficulty paying  No fever or chills. Smoking 5-6 cigarettes per day.  He is unsure about his medications. Followed by HF Paramedicine. medication box is filled weekly.   He previously worked jobs with [employer]. Still does occasional landscaping jobs. Has difficulty with transportation. His daughter lives with him and his grandchildren.    Past Medical History:  Diagnosis Date   Acute metabolic encephalopathy 9/50/9326   Acute respiratory failure with hypoxia ([place]) 03/20/2022   Acute respiratory failure with hypoxia and hypercapnia ([facility]) 03/06/2022   CHF (congestive heart failure) ([facility])    Dyslipidemia    Hypertension     Current Outpatient Medications  Medication Sig Dispense Refill   albuterol (VENTOLIN HFA) 108 (90 Base) MCG/ACT inhaler Inhale 2 puffs into the lungs every 4 (four) hours as needed for wheezing or shortness of breath. 1 each 11  aspirin EC 81 MG tablet Take 1 tablet (81 mg total) by mouth daily. Swallow whole. 90 tablet 3   atorvastatin (LIPITOR) 80 MG tablet Take 1 tablet (80 mg total) by mouth every evening. 90 tablet 3   bisoprolol (ZEBETA) 5 MG tablet TAKE 1 Tablet BY MOUTH ONCE DAILY 30 tablet 6    Budeson-Glycopyrrol-Formoterol (BREZTRI AEROSPHERE) 160-9-4.8 MCG/ACT AERO Inhale 2 puffs into the lungs as needed. 10.6 g 11   FARXIGA 10 MG TABS tablet TAKE 1 Tablet BY MOUTH ONCE DAILY 30 tablet 5   furosemide (LASIX) 40 MG tablet TAKE 1 Tablet BY MOUTH TWICE DAILY 90 tablet 3   guaiFENesin-dextromethorphan (ROBITUSSIN DM) 100-10 MG/5ML syrup Take 5 mLs by mouth every 4 (four) hours as needed for cough. 118 mL 0   ipratropium-albuterol (DUONEB) 0.5-2.5 (3) MG/3ML SOLN Inhale 3 mLs into the lungs in the morning, at noon, in the evening, and at bedtime. 360 mL 11   isosorbide-hydrALAZINE (BIDIL) 20-37.5 MG tablet Take 1 tablet by mouth 3 (three) times daily. 90 tablet 3   metFORMIN (GLUCOPHAGE) 500 MG tablet Take 1 tablet (500 mg total) by mouth 2 (two) times daily with a meal. 60 tablet 0   montelukast (SINGULAIR) 10 MG tablet Take 10 mg by mouth at bedtime.     nicotine (NICODERM CQ - DOSED IN MG/24 HOURS) 21 mg/24hr patch Place 1 patch (21 mg total) onto the skin daily. (Patient not taking: Reported on 11/13/2022) 28 patch 3   nitroGLYCERIN (NITROSTAT) 0.4 MG SL tablet Place 1 tablet (0.4 mg total) under the tongue every 5 (five) minutes as needed for chest pain. (Patient not taking: Reported on 11/13/2022) 25 tablet 12   predniSONE (DELTASONE) 20 MG tablet Take 2 tablets (40 mg total) by mouth daily with breakfast. 3 tablet 0   sacubitril-valsartan (ENTRESTO) 24-26 MG Take 1 tablet by mouth 2 (two) times daily. 90 tablet 1   spironolactone (ALDACTONE) 25 MG tablet Take 12.5 mg by mouth daily.     No current facility-administered medications for this visit.    No Known Allergies    Social History   Socioeconomic History   Marital status: Single    Spouse name: Not on file   Number of children: 1   Years of education: Not on file   Highest education level: 10th grade  Occupational History   Occupation: Disability  Tobacco Use   Smoking status: Former    Packs/day: 0.50    Years:  50.00    Total pack years: 25.00    Types: Cigarettes    Quit date: 03/19/2022    Years since quitting: 0.6   Smokeless tobacco: Never   Tobacco comments:    Smoking cessation  Vaping Use   Vaping Use: Never used  Substance and Sexual Activity   Alcohol use: Not on file    Comment: socially   Drug use: Yes    Types: Marijuana    Comment: past   Sexual activity: Not on file  Other Topics Concern   Not on file  Social History Narrative   He lives with his daughter apparently and 5 grandchildren.  He reports that he smokes probably less than a pack of cigarettes a day.  He occasionally drinks a beer or liquor but not daily.   Social Determinants of Health   Financial Resource Strain: Low Risk  (03/11/2022)   Overall Financial Resource Strain (CARDIA)    Difficulty of Paying Living Expenses: Not very hard  Food Insecurity:  No Food Insecurity (11/11/2022)   Hunger Vital Sign    Worried About Running Out of Food in the Last Year: Never true    Ran Out of Food in the Last Year: Never true  Recent Concern: Food Insecurity - Food Insecurity Present (10/26/2022)   Hunger Vital Sign    Worried About Charity fundraiser in the Last Year: Often true    Ran Out of Food in the Last Year: Often true  Transportation Needs: Unmet Transportation Needs (11/18/2022)   PRAPARE - Hydrologist (Medical): Yes    Lack of Transportation (Non-Medical): Yes  Physical Activity: Not on file  Stress: Not on file  Social Connections: Not on file  Intimate Partner Violence: Not At Risk (11/11/2022)   Humiliation, Afraid, Rape, and Kick questionnaire    Fear of Current or Ex-Partner: No    Emotionally Abused: No    Physically Abused: No    Sexually Abused: No      Family History  Problem Relation Age of Onset   Hypertension Mother     There were no vitals filed for this visit.    PHYSICAL EXAM: General:  Walked slowly in the clinic. No respiratory difficulty HEENT:  normal Neck: supple. no JVD. Carotids 2+ bilat; no bruits. No lymphadenopathy or thryomegaly appreciated. Cor: PMI nondisplaced. Regular rate & rhythm. No rubs, gallops or murmurs. Lungs: EW throughout.  Abdomen: soft, nontender, nondistended. No hepatosplenomegaly. No bruits or masses. Good bowel sounds. Extremities: no cyanosis, clubbing, rash, edema Neuro: alert & oriented x 3, cranial nerves grossly intact. moves all 4 extremities w/o difficulty. Affect pleasant.  ECG: SR 63 bpm  RBBB   ASSESSMENT & PLAN:   Chronic Systolic Heart Failure/ NICM: -Echo 05/23: LVEF 35%, RV okay -R/LHC 05/23: Nonobstructive CAD, RA mean 12, PA 54/29 (37), PCWP mean 23, LVEDP 22 mmHg - Echo EF improved 40-45%. Out of window for ICD. Had LVH. Needs PYP. Set up next visit.  -NYHA III HF concominant COPD  - Volume status stable. Continue lasix 40 mg twice a day  - Continue Farxiga 10 mg daily  - Continue Entresto 97-103 mg bid - Continue  Bisoprolol to 5 mg daily  - Stop spiro and potassium with recent elevated potassium  - Continue Bidil 1 tab tid   2. Syncope: -Mulitple recent episodes. Recently evaluated in the ED. Event felt to by vasovagal.  - No further issues.  - EKG no arrhythmias.    3. HTN: - Continue current regimen.  -Scheduled for sleep study   4. CAD: -Nonobstructive on LHC 05/23 - No chest pain.  -Continue aspirin and statin   5. Tobacco use/suspected COPD: -Discussed cessation.   6 HLD: -On Atorvastatin -Management per Cardiology or PCP   7. DM II: -New diagnosis -A1c 6.9% in 05/23 -Continue Farxiga  7. SDOH Difficulty paying for food.  He has difficulty with transportation.  - Referred to HF SW and will reach out and see if Managed Medicaid Team for chronic disease management is an option.  - For now continue HF Paramedicine. I will reach out to [facility], HF Paramedic. that has been following him.    Check BMET today.   Follow up in 4 weeks with APP. Refer  to [facility] Medicaid for chronic case management. Needs help long term plan.   Rafael Bihari, NP-C

## 2022-11-23 ENCOUNTER — Encounter (HOSPITAL_COMMUNITY): Payer: Self-pay

## 2022-11-23 ENCOUNTER — Ambulatory Visit (HOSPITAL_COMMUNITY)
Admission: RE | Admit: 2022-11-23 | Discharge: 2022-11-23 | Disposition: A | Discharge status: Home / Self Care | Payer: 59 | Source: Ambulatory Visit | Attending: Family Medicine | Admitting: Family Medicine

## 2022-11-23 VITALS — BP 122/90 | HR 72 | Wt 153.8 lb

## 2022-11-23 DIAGNOSIS — Z5982 Transportation insecurity: Secondary | ICD-10-CM | POA: Insufficient documentation

## 2022-11-23 DIAGNOSIS — E785 Hyperlipidemia, unspecified: Secondary | ICD-10-CM | POA: Diagnosis not present

## 2022-11-23 DIAGNOSIS — Z7982 Long term (current) use of aspirin: Secondary | ICD-10-CM | POA: Diagnosis not present

## 2022-11-23 DIAGNOSIS — E118 Type 2 diabetes mellitus with unspecified complications: Secondary | ICD-10-CM

## 2022-11-23 DIAGNOSIS — Z720 Tobacco use: Secondary | ICD-10-CM

## 2022-11-23 DIAGNOSIS — I2510 Atherosclerotic heart disease of native coronary artery without angina pectoris: Secondary | ICD-10-CM

## 2022-11-23 DIAGNOSIS — I110 Hypertensive heart disease with heart failure: Secondary | ICD-10-CM | POA: Diagnosis not present

## 2022-11-23 DIAGNOSIS — I5022 Chronic systolic (congestive) heart failure: Secondary | ICD-10-CM | POA: Insufficient documentation

## 2022-11-23 DIAGNOSIS — E119 Type 2 diabetes mellitus without complications: Secondary | ICD-10-CM | POA: Insufficient documentation

## 2022-11-23 DIAGNOSIS — R55 Syncope and collapse: Secondary | ICD-10-CM | POA: Diagnosis not present

## 2022-11-23 DIAGNOSIS — I428 Other cardiomyopathies: Secondary | ICD-10-CM | POA: Diagnosis not present

## 2022-11-23 DIAGNOSIS — F17210 Nicotine dependence, cigarettes, uncomplicated: Secondary | ICD-10-CM | POA: Insufficient documentation

## 2022-11-23 DIAGNOSIS — Z79899 Other long term (current) drug therapy: Secondary | ICD-10-CM | POA: Diagnosis not present

## 2022-11-23 DIAGNOSIS — I10 Essential (primary) hypertension: Secondary | ICD-10-CM

## 2022-11-23 DIAGNOSIS — Z7984 Long term (current) use of oral hypoglycemic drugs: Secondary | ICD-10-CM | POA: Diagnosis not present

## 2022-11-23 DIAGNOSIS — E782 Mixed hyperlipidemia: Secondary | ICD-10-CM

## 2022-11-23 DIAGNOSIS — Z139 Encounter for screening, unspecified: Secondary | ICD-10-CM

## 2022-11-23 DIAGNOSIS — G4733 Obstructive sleep apnea (adult) (pediatric): Secondary | ICD-10-CM | POA: Diagnosis not present

## 2022-11-23 LAB — BASIC METABOLIC PANEL
Anion gap: 15 (ref 5–15)
BUN: 26 mg/dL — ABNORMAL HIGH (ref 8–23)
CO2: 25 mmol/L (ref 22–32)
Calcium: 9.4 mg/dL (ref 8.9–10.3)
Chloride: 93 mmol/L — ABNORMAL LOW (ref 98–111)
Creatinine, Ser: 1.47 mg/dL — ABNORMAL HIGH (ref 0.61–1.24)
GFR, Estimated: 50 mL/min — ABNORMAL LOW (ref >60)
Glucose, Bld: 100 mg/dL — ABNORMAL HIGH (ref 70–99)
Potassium: 4 mmol/L (ref 3.5–5.1)
Sodium: 133 mmol/L — ABNORMAL LOW (ref 135–145)

## 2022-11-23 NOTE — Addendum Note (Signed)
Encounter addended by: Jorge Ny, LCSW on: 11/23/2022 12:44 PM  Actions taken: Flowsheet accepted, Clinical Note Signed

## 2022-11-23 NOTE — Progress Notes (Signed)
H&V Care Navigation CSW Progress Note  Clinical Social Worker met with pt and discussed possible payor resources to assist with future transportation.  Gave information on how to contact [place] as well as Medicaid to set up transportation- pt expressed understanding and will plan to reach out.   SDOH Screenings   Food Insecurity: No Food Insecurity (11/11/2022)  Recent Concern: Food Insecurity - Food Insecurity Present (10/26/2022)  Housing: Low Risk  (11/11/2022)  Transportation Needs: Unmet Transportation Needs (11/18/2022)  Utilities: Not At Risk (11/11/2022)  Alcohol Screen: Low Risk  (05/06/2022)  Financial Resource Strain: Low Risk  (03/11/2022)  Tobacco Use: High Risk (11/23/2022)    Marcus Tran, [facility] Desk#: [phone] Cell#: [phone]

## 2022-11-23 NOTE — Patient Instructions (Addendum)
Labs done today. We will contact you only if your labs are abnormal.  No medication changes were made. Please continue all current medications as prescribed.  Dr. Landis Gandy office will contact you regarding a Bipap machine.   Your provider has requested that you have a PYP scan done. This has to be approved through your insurance company prior to scheduling, once approved we will contact you to schedule an appointment.   Your physician recommends that you schedule a follow-up appointment in: 2-3 months with Dr. Aundra Dubin. Please contact our office in March to schedule a April or May appointment.   If you have any questions or concerns before your next appointment please send [place] a message through [facility] or call our office at [phone].    TO LEAVE A MESSAGE FOR THE NURSE SELECT OPTION 2, PLEASE LEAVE A MESSAGE INCLUDING: YOUR NAME DATE OF BIRTH CALL BACK NUMBER REASON FOR CALL**this is important as we prioritize the call backs  YOU WILL RECEIVE A CALL BACK THE SAME DAY AS LONG AS YOU CALL BEFORE 4:00 PM   Do the following things EVERYDAY: Weigh yourself in the morning before breakfast. Write it down and keep it in a log. Take your medicines as prescribed Eat low salt foods--Limit salt (sodium) to 2000 mg per day.  Stay as active as you can everyday Limit all fluids for the day to less than 2 liters   At the [facility], you and your health needs are our priority. As part of our continuing mission to provide you with exceptional heart care, we have created designated Provider Care Teams. These Care Teams include your primary Cardiologist (physician) and Advanced Practice Providers (APPs- Physician Assistants and Nurse Practitioners) who all work together to provide you with the care you need, when you need it.   You may see any of the following providers on your designated Care Team at your next follow up: Dr Glori Bickers Dr Haynes Kerns, NP Lyda Jester, [place] Audry Riles, PharmD   Please be sure to bring in all your medications bottles to every appointment.

## 2022-11-25 ENCOUNTER — Other Ambulatory Visit (HOSPITAL_COMMUNITY): Payer: Self-pay | Admitting: Emergency Medicine

## 2022-11-25 NOTE — Progress Notes (Signed)
Paramedicine Encounter    Patient ID: Marcus Tran, male    DOB: Jan 04, 1951, 72 y.o.   MRN: [id number]   Complaints NONE  Assessment No chest pain, SOB, no edema.  Lung sounds clear and equal throughout  Compliance with meds 100% compliant  Pill box filled x 1 week  Refills needed none   Meds changes since last visit none    Social changes none   BP 100/70 (BP Location: Right Arm, Patient Position: Sitting, Cuff Size: Normal)   Pulse 80   Resp 14   Wt 149 lb 9.6 oz (67.9 kg)   SpO2 95%   BMI 20.86 kg/m  Weight yesterday-not taken Last visit weight-151lb  ACTION: Home visit completed  [name] [phone] 11/25/22  Patient Care Team: Cipriano Mile, NP as PCP - Cheral Bay, MD as PCP - Cardiology (Cardiology)  Patient Active Problem List   Diagnosis Date Noted   Adenovirus pneumonia 11/12/2022   COPD exacerbation ([place]) 08/13/2022   Chronic HFrEF (heart failure with reduced ejection fraction) ([place]) 08/13/2022   DM2 (diabetes mellitus, type 2) ([place]) 08/13/2022   Obstructive sleep apnea (adult) (pediatric) 08/08/2022   Acute respiratory failure with hypoxia ([place]) 05/04/2022   Third degree heart block ([place]) 04/15/2022   Snoring 04/15/2022   Nonischemic cardiomyopathy ([place]) 04/15/2022   HFrEF (heart failure with reduced ejection fraction) ([place]) 04/15/2022   COPD with acute exacerbation ([place]) 03/22/2022   Acute on chronic combined systolic and diastolic CHF (congestive heart failure) ([place])    Tobacco use disorder 03/08/2022   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   HTN (hypertension) 03/08/2022   Stage 3a chronic kidney disease (CKD) ([place]) 03/08/2022   Coronary artery disease 03/08/2022    Current Outpatient Medications:    albuterol (VENTOLIN HFA) 108 (90 Base) MCG/ACT inhaler, Inhale 2 puffs into the lungs every 4 (four) hours as needed for wheezing or shortness of breath., Disp: 1 each, Rfl: 11   aspirin EC 81 MG  tablet, Take 1 tablet (81 mg total) by mouth daily. Swallow whole., Disp: 90 tablet, Rfl: 3   atorvastatin (LIPITOR) 80 MG tablet, Take 1 tablet (80 mg total) by mouth every evening., Disp: 90 tablet, Rfl: 3   bisoprolol (ZEBETA) 5 MG tablet, TAKE 1 Tablet BY MOUTH ONCE DAILY, Disp: 30 tablet, Rfl: 6   Budeson-Glycopyrrol-Formoterol (BREZTRI AEROSPHERE) 160-9-4.8 MCG/ACT AERO, Inhale 2 puffs into the lungs as needed., Disp: 10.6 g, Rfl: 11   FARXIGA 10 MG TABS tablet, TAKE 1 Tablet BY MOUTH ONCE DAILY, Disp: 30 tablet, Rfl: 5   furosemide (LASIX) 40 MG tablet, TAKE 1 Tablet BY MOUTH TWICE DAILY, Disp: 90 tablet, Rfl: 3   ipratropium-albuterol (DUONEB) 0.5-2.5 (3) MG/3ML SOLN, Inhale 3 mLs into the lungs in the morning, at noon, in the evening, and at bedtime., Disp: 360 mL, Rfl: 11   isosorbide-hydrALAZINE (BIDIL) 20-37.5 MG tablet, Take 1 tablet by mouth 3 (three) times daily., Disp: 90 tablet, Rfl: 3   metFORMIN (GLUCOPHAGE) 500 MG tablet, Take 1 tablet (500 mg total) by mouth 2 (two) times daily with a meal., Disp: 60 tablet, Rfl: 0   montelukast (SINGULAIR) 10 MG tablet, Take 10 mg by mouth at bedtime., Disp: , Rfl:    sacubitril-valsartan (ENTRESTO) 24-26 MG, Take 1 tablet by mouth 2 (two) times daily., Disp: 90 tablet, Rfl: 1   spironolactone (ALDACTONE) 25 MG tablet, Take 12.5 mg by mouth daily., Disp: , Rfl:    guaiFENesin-dextromethorphan (ROBITUSSIN DM) 100-10 MG/5ML syrup, Take  5 mLs by mouth every 4 (four) hours as needed for cough. (Patient not taking: Reported on 11/25/2022), Disp: 118 mL, Rfl: 0   nicotine (NICODERM CQ - DOSED IN MG/24 HOURS) 21 mg/24hr patch, Place 1 patch (21 mg total) onto the skin daily. (Patient not taking: Reported on 11/13/2022), Disp: 28 patch, Rfl: 3   nitroGLYCERIN (NITROSTAT) 0.4 MG SL tablet, Place 1 tablet (0.4 mg total) under the tongue every 5 (five) minutes as needed for chest pain. (Patient not taking: Reported on 11/25/2022), Disp: 25 tablet, Rfl: 12    predniSONE (DELTASONE) 20 MG tablet, Take 2 tablets (40 mg total) by mouth daily with breakfast., Disp: 3 tablet, Rfl: 0 No Known Allergies   Social History   Socioeconomic History   Marital status: Single    Spouse name: Not on file   Number of children: 1   Years of education: Not on file   Highest education level: 10th grade  Occupational History   Occupation: Disability  Tobacco Use   Smoking status: Some Days    Packs/day: 0.50    Years: 50.00    Total pack years: 25.00    Types: Cigarettes    Last attempt to quit: 03/19/2022    Years since quitting: 0.6   Smokeless tobacco: Never   Tobacco comments:    Smoking cessation  Vaping Use   Vaping Use: Never used  Substance and Sexual Activity   Alcohol use: Not on file    Comment: socially   Drug use: Yes    Types: Marijuana    Comment: past   Sexual activity: Not on file  Other Topics Concern   Not on file  Social History Narrative   He lives with his daughter apparently and 5 grandchildren.  He reports that he smokes probably less than a pack of cigarettes a day.  He occasionally drinks a beer or liquor but not daily.   Social Determinants of Health   Financial Resource Strain: Low Risk  (03/11/2022)   Overall Financial Resource Strain (CARDIA)    Difficulty of Paying Living Expenses: Not very hard  Food Insecurity: No Food Insecurity (11/11/2022)   Hunger Vital Sign    Worried About Running Out of Food in the Last Year: Never true    Ran Out of Food in the Last Year: Never true  Recent Concern: Food Insecurity - Food Insecurity Present (10/26/2022)   Hunger Vital Sign    Worried About Running Out of Food in the Last Year: Often true    Ran Out of Food in the Last Year: Often true  Transportation Needs: Unmet Transportation Needs (11/18/2022)   PRAPARE - Hydrologist (Medical): Yes    Lack of Transportation (Non-Medical): Yes  Physical Activity: Not on file  Stress: Not on file  Social  Connections: Not on file  Intimate Partner Violence: Not At Risk (11/11/2022)   Humiliation, Afraid, Rape, and Kick questionnaire    Fear of Current or Ex-Partner: No    Emotionally Abused: No    Physically Abused: No    Sexually Abused: No    Physical Exam      Future Appointments  Date Time Provider [place]  12/31/2022 11:30 AM Evans Lance, MD [facility]

## 2022-11-27 ENCOUNTER — Telehealth (HOSPITAL_COMMUNITY): Payer: Self-pay | Admitting: *Deleted

## 2022-12-01 ENCOUNTER — Other Ambulatory Visit (HOSPITAL_COMMUNITY): Payer: Self-pay | Admitting: Emergency Medicine

## 2022-12-01 NOTE — Progress Notes (Signed)
Paramedicine Encounter    Patient ID: Marcus Tran, male    DOB: 01-08-1951, 72 y.o.   MRN: [id number]   Complaints None  Assessment A&O x 4, no chest pain or edema noted.  Lung sounds with mild wheezing throughout.    Compliance with meds 100% compliant  Pill box filled x 1 week  Refills needed Wants script for Nicoderm CQ (lost his prescription before he could get it filled)  Meds changes since last visit none    Social changes None   BP 110/80 (BP Location: Right Arm, Patient Position: Sitting, Cuff Size: Normal)   Pulse 72   Resp 16   Wt 149 lb (67.6 kg)   SpO2 96%   BMI 20.78 kg/m  Weight yesterday-not taken Last visit weight-149lb  ACTION: Home visit completed  [name] [id number] 12/01/22  Patient Care Team: Cipriano Mile, NP as PCP - Cheral Bay, MD as PCP - Cardiology (Cardiology)  Patient Active Problem List   Diagnosis Date Noted   Adenovirus pneumonia 11/12/2022   COPD exacerbation ([place]) 08/13/2022   Chronic HFrEF (heart failure with reduced ejection fraction) ([place]) 08/13/2022   DM2 (diabetes mellitus, type 2) ([place]) 08/13/2022   Obstructive sleep apnea (adult) (pediatric) 08/08/2022   Acute respiratory failure with hypoxia ([place]) 05/04/2022   Third degree heart block ([place]) 04/15/2022   Snoring 04/15/2022   Nonischemic cardiomyopathy ([place]) 04/15/2022   HFrEF (heart failure with reduced ejection fraction) ([place]) 04/15/2022   COPD with acute exacerbation ([place]) 03/22/2022   Acute on chronic combined systolic and diastolic CHF (congestive heart failure) ([place])    Tobacco use disorder 03/08/2022   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   HTN (hypertension) 03/08/2022   Stage 3a chronic kidney disease (CKD) ([place]) 03/08/2022   Coronary artery disease 03/08/2022    Current Outpatient Medications:    albuterol (VENTOLIN HFA) 108 (90 Base) MCG/ACT inhaler, Inhale 2 puffs into the lungs every 4 (four) hours as needed  for wheezing or shortness of breath., Disp: 1 each, Rfl: 11   aspirin EC 81 MG tablet, Take 1 tablet (81 mg total) by mouth daily. Swallow whole., Disp: 90 tablet, Rfl: 3   atorvastatin (LIPITOR) 80 MG tablet, Take 1 tablet (80 mg total) by mouth every evening., Disp: 90 tablet, Rfl: 3   bisoprolol (ZEBETA) 5 MG tablet, TAKE 1 Tablet BY MOUTH ONCE DAILY, Disp: 30 tablet, Rfl: 6   Budeson-Glycopyrrol-Formoterol (BREZTRI AEROSPHERE) 160-9-4.8 MCG/ACT AERO, Inhale 2 puffs into the lungs as needed., Disp: 10.6 g, Rfl: 11   FARXIGA 10 MG TABS tablet, TAKE 1 Tablet BY MOUTH ONCE DAILY, Disp: 30 tablet, Rfl: 5   furosemide (LASIX) 40 MG tablet, TAKE 1 Tablet BY MOUTH TWICE DAILY, Disp: 90 tablet, Rfl: 3   ipratropium-albuterol (DUONEB) 0.5-2.5 (3) MG/3ML SOLN, Inhale 3 mLs into the lungs in the morning, at noon, in the evening, and at bedtime., Disp: 360 mL, Rfl: 11   isosorbide-hydrALAZINE (BIDIL) 20-37.5 MG tablet, Take 1 tablet by mouth 3 (three) times daily., Disp: 90 tablet, Rfl: 3   metFORMIN (GLUCOPHAGE) 500 MG tablet, Take 1 tablet (500 mg total) by mouth 2 (two) times daily with a meal., Disp: 60 tablet, Rfl: 0   montelukast (SINGULAIR) 10 MG tablet, Take 10 mg by mouth at bedtime., Disp: , Rfl:    sacubitril-valsartan (ENTRESTO) 24-26 MG, Take 1 tablet by mouth 2 (two) times daily., Disp: 90 tablet, Rfl: 1   spironolactone (ALDACTONE) 25 MG tablet, Take 12.5 mg by  mouth daily., Disp: , Rfl:    guaiFENesin-dextromethorphan (ROBITUSSIN DM) 100-10 MG/5ML syrup, Take 5 mLs by mouth every 4 (four) hours as needed for cough., Disp: 118 mL, Rfl: 0   nicotine (NICODERM CQ - DOSED IN MG/24 HOURS) 21 mg/24hr patch, Place 1 patch (21 mg total) onto the skin daily. (Patient not taking: Reported on 11/13/2022), Disp: 28 patch, Rfl: 3   nitroGLYCERIN (NITROSTAT) 0.4 MG SL tablet, Place 1 tablet (0.4 mg total) under the tongue every 5 (five) minutes as needed for chest pain. (Patient not taking: Reported on  11/25/2022), Disp: 25 tablet, Rfl: 12   predniSONE (DELTASONE) 20 MG tablet, Take 2 tablets (40 mg total) by mouth daily with breakfast., Disp: 3 tablet, Rfl: 0 No Known Allergies   Social History   Socioeconomic History   Marital status: Single    Spouse name: Not on file   Number of children: 1   Years of education: Not on file   Highest education level: 10th grade  Occupational History   Occupation: Disability  Tobacco Use   Smoking status: Some Days    Packs/day: 0.50    Years: 50.00    Total pack years: 25.00    Types: Cigarettes    Last attempt to quit: 03/19/2022    Years since quitting: 0.7   Smokeless tobacco: Never   Tobacco comments:    Smoking cessation  Vaping Use   Vaping Use: Never used  Substance and Sexual Activity   Alcohol use: Not on file    Comment: socially   Drug use: Yes    Types: Marijuana    Comment: past   Sexual activity: Not on file  Other Topics Concern   Not on file  Social History Narrative   He lives with his daughter apparently and 5 grandchildren.  He reports that he smokes probably less than a pack of cigarettes a day.  He occasionally drinks a beer or liquor but not daily.   Social Determinants of Health   Financial Resource Strain: Low Risk  (03/11/2022)   Overall Financial Resource Strain (CARDIA)    Difficulty of Paying Living Expenses: Not very hard  Food Insecurity: No Food Insecurity (11/11/2022)   Hunger Vital Sign    Worried About Running Out of Food in the Last Year: Never true    Ran Out of Food in the Last Year: Never true  Recent Concern: Food Insecurity - Food Insecurity Present (10/26/2022)   Hunger Vital Sign    Worried About Running Out of Food in the Last Year: Often true    Ran Out of Food in the Last Year: Often true  Transportation Needs: Unmet Transportation Needs (11/18/2022)   PRAPARE - Hydrologist (Medical): Yes    Lack of Transportation (Non-Medical): Yes  Physical Activity: Not  on file  Stress: Not on file  Social Connections: Not on file  Intimate Partner Violence: Not At Risk (11/11/2022)   Humiliation, Afraid, Rape, and Kick questionnaire    Fear of Current or Ex-Partner: No    Emotionally Abused: No    Physically Abused: No    Sexually Abused: No    Physical Exam      Future Appointments  Date Time Provider [place]  12/31/2022 11:30 AM Evans Lance, MD [facility]

## 2022-12-02 ENCOUNTER — Other Ambulatory Visit (HOSPITAL_COMMUNITY): Payer: Self-pay

## 2022-12-02 MED ORDER — NICOTINE 21 MG/24HR TD PT24: 21.0000 mg | MEDICATED_PATCH | Freq: Every day | TRANSDERMAL | 3 refills | Status: DC

## 2022-12-08 ENCOUNTER — Other Ambulatory Visit (HOSPITAL_COMMUNITY): Payer: Self-pay | Admitting: Emergency Medicine

## 2022-12-08 NOTE — Progress Notes (Signed)
Paramedicine Encounter    Patient ID: Marcus Tran, male    DOB: 05-Sep-1951, 72 y.o.   MRN: [id number]   Complaints NONE  Assessment no chest pain or SOB.  No edema.  Lung sounds with mild expiratory wheezes in the upper lobes.  Compliance with meds 100%  Pill box filledd x 1 week  Refills needed Duo Neb  Meds changes since last visit none    Social changes NONE   BP 120/80 (BP Location: Right Arm, Patient Position: Standing, Cuff Size: Normal)   Pulse 74   Wt 147 lb 6.4 oz (66.9 kg)   SpO2 96%   BMI 20.56 kg/m  Weight yesterday-not taken Last visit weight-149lb  Marcus Tran reports to be feeling well.  He has done well with med compliance.  Refill called in for his DuoNeb, needs refill from doctor and should be able to be delivered tomorrow.  Advised Marcus Tran to let me know when he receives same.    ACTION: Home visit completed  Marcus Tran [id number] 12/08/22  Patient Care Team: Cipriano Mile, NP as PCP - Cheral Bay, MD as PCP - Cardiology (Cardiology)  Patient Active Problem List   Diagnosis Date Noted   Adenovirus pneumonia 11/12/2022   COPD exacerbation ([place]) 08/13/2022   Chronic HFrEF (heart failure with reduced ejection fraction) ([place]) 08/13/2022   DM2 (diabetes mellitus, type 2) ([place]) 08/13/2022   Obstructive sleep apnea (adult) (pediatric) 08/08/2022   Acute respiratory failure with hypoxia ([place]) 05/04/2022   Third degree heart block ([place]) 04/15/2022   Snoring 04/15/2022   Nonischemic cardiomyopathy ([place]) 04/15/2022   HFrEF (heart failure with reduced ejection fraction) ([place]) 04/15/2022   COPD with acute exacerbation ([place]) 03/22/2022   Acute on chronic combined systolic and diastolic CHF (congestive heart failure) ([place])    Tobacco use disorder 03/08/2022   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   HTN (hypertension) 03/08/2022   Stage 3a chronic kidney disease (CKD) ([place]) 03/08/2022   Coronary artery disease  03/08/2022    Current Outpatient Medications:    albuterol (VENTOLIN HFA) 108 (90 Base) MCG/ACT inhaler, Inhale 2 puffs into the lungs every 4 (four) hours as needed for wheezing or shortness of breath., Disp: 1 each, Rfl: 11   aspirin EC 81 MG tablet, Take 1 tablet (81 mg total) by mouth daily. Swallow whole., Disp: 90 tablet, Rfl: 3   atorvastatin (LIPITOR) 80 MG tablet, Take 1 tablet (80 mg total) by mouth every evening., Disp: 90 tablet, Rfl: 3   bisoprolol (ZEBETA) 5 MG tablet, TAKE 1 Tablet BY MOUTH ONCE DAILY, Disp: 30 tablet, Rfl: 6   Budeson-Glycopyrrol-Formoterol (BREZTRI AEROSPHERE) 160-9-4.8 MCG/ACT AERO, Inhale 2 puffs into the lungs as needed., Disp: 10.6 g, Rfl: 11   FARXIGA 10 MG TABS tablet, TAKE 1 Tablet BY MOUTH ONCE DAILY, Disp: 30 tablet, Rfl: 5   furosemide (LASIX) 40 MG tablet, TAKE 1 Tablet BY MOUTH TWICE DAILY, Disp: 90 tablet, Rfl: 3   ipratropium-albuterol (DUONEB) 0.5-2.5 (3) MG/3ML SOLN, Inhale 3 mLs into the lungs in the morning, at noon, in the evening, and at bedtime., Disp: 360 mL, Rfl: 11   isosorbide-hydrALAZINE (BIDIL) 20-37.5 MG tablet, Take 1 tablet by mouth 3 (three) times daily., Disp: 90 tablet, Rfl: 3   metFORMIN (GLUCOPHAGE) 500 MG tablet, Take 1 tablet (500 mg total) by mouth 2 (two) times daily with a meal., Disp: 60 tablet, Rfl: 0   montelukast (SINGULAIR) 10 MG tablet, Take 10 mg by mouth at bedtime.,  Disp: , Rfl:    sacubitril-valsartan (ENTRESTO) 24-26 MG, Take 1 tablet by mouth 2 (two) times daily., Disp: 90 tablet, Rfl: 1   guaiFENesin-dextromethorphan (ROBITUSSIN DM) 100-10 MG/5ML syrup, Take 5 mLs by mouth every 4 (four) hours as needed for cough., Disp: 118 mL, Rfl: 0   nicotine (NICODERM CQ - DOSED IN MG/24 HOURS) 21 mg/24hr patch, Place 1 patch (21 mg total) onto the skin daily. (Patient not taking: Reported on 12/08/2022), Disp: 28 patch, Rfl: 3   nitroGLYCERIN (NITROSTAT) 0.4 MG SL tablet, Place 1 tablet (0.4 mg total) under the tongue every  5 (five) minutes as needed for chest pain. (Patient not taking: Reported on 11/25/2022), Disp: 25 tablet, Rfl: 12   predniSONE (DELTASONE) 20 MG tablet, Take 2 tablets (40 mg total) by mouth daily with breakfast., Disp: 3 tablet, Rfl: 0   spironolactone (ALDACTONE) 25 MG tablet, Take 12.5 mg by mouth daily., Disp: , Rfl:  No Known Allergies   Social History   Socioeconomic History   Marital status: Single    Spouse name: Not on file   Number of children: 1   Years of education: Not on file   Highest education level: 10th grade  Occupational History   Occupation: Disability  Tobacco Use   Smoking status: Some Days    Packs/day: 0.50    Years: 50.00    Total pack years: 25.00    Types: Cigarettes    Last attempt to quit: 03/19/2022    Years since quitting: 0.7   Smokeless tobacco: Never   Tobacco comments:    Smoking cessation  Vaping Use   Vaping Use: Never used  Substance and Sexual Activity   Alcohol use: Not on file    Comment: socially   Drug use: Yes    Types: Marijuana    Comment: past   Sexual activity: Not on file  Other Topics Concern   Not on file  Social History Narrative   He lives with his daughter apparently and 5 grandchildren.  He reports that he smokes probably less than a pack of cigarettes a day.  He occasionally drinks a beer or liquor but not daily.   Social Determinants of Health   Financial Resource Strain: Low Risk  (03/11/2022)   Overall Financial Resource Strain (CARDIA)    Difficulty of Paying Living Expenses: Not very hard  Food Insecurity: No Food Insecurity (11/11/2022)   Hunger Vital Sign    Worried About Running Out of Food in the Last Year: Never true    Ran Out of Food in the Last Year: Never true  Recent Concern: Food Insecurity - Food Insecurity Present (10/26/2022)   Hunger Vital Sign    Worried About Charity fundraiser in the Last Year: Often true    Ran Out of Food in the Last Year: Often true  Transportation Needs: Unmet  Transportation Needs (11/18/2022)   PRAPARE - Hydrologist (Medical): Yes    Lack of Transportation (Non-Medical): Yes  Physical Activity: Not on file  Stress: Not on file  Social Connections: Not on file  Intimate Partner Violence: Not At Risk (11/11/2022)   Humiliation, Afraid, Rape, and Kick questionnaire    Fear of Current or Ex-Partner: No    Emotionally Abused: No    Physically Abused: No    Sexually Abused: No    Physical Exam      Future Appointments  Date Time Provider [place]  12/31/2022 11:30 AM Lovena Le,  Champ Mungo, MD [facility]

## 2022-12-09 ENCOUNTER — Other Ambulatory Visit (HOSPITAL_COMMUNITY): Payer: Self-pay | Admitting: Emergency Medicine

## 2022-12-09 ENCOUNTER — Telehealth (HOSPITAL_COMMUNITY): Payer: Self-pay | Admitting: *Deleted

## 2022-12-09 NOTE — Progress Notes (Signed)
Picked up 2 boxes of both perishable and non-perishable food items from [place]. [employer].  Delivered these items were delivered to [name] and he was grateful for same.    Renee Ramus, [place] 12/09/2022

## 2022-12-09 NOTE — Telephone Encounter (Signed)
   [employer] auth request denied  Routed to provider

## 2022-12-11 ENCOUNTER — Other Ambulatory Visit (HOSPITAL_COMMUNITY): Payer: Self-pay | Admitting: Emergency Medicine

## 2022-12-11 NOTE — Progress Notes (Signed)
Delivered Marcus Tran [facility] to [facility] to get prepared for Bubble Packs.  I met with pharmacist and provided him with a printed med list marked up with AM and PM doses.  These are scheduled to be delivered to Marcus Tran on 2/26 and I will do a home visit 2/27 @ 11:15.   Also Marcus Tran had two prescriptions at the pharmacy and I picked up and delivered to his residence. ( Albuterol and Anti fungal cream for his feet)    Renee Ramus, [place] 12/11/2022

## 2022-12-14 ENCOUNTER — Other Ambulatory Visit (HOSPITAL_COMMUNITY): Payer: Self-pay | Admitting: Emergency Medicine

## 2022-12-14 NOTE — Progress Notes (Signed)
Picked up initial 10 days worth of bubble pack meds for [name] from [facility].  Delivered meds and reviewed use of bubble packs and he verbalizes he understands same.  I encouraged him to keep these out of reach of his grandchildren as they may inadvertently think they are candy due to the packaging. He will finish today and Tuesday meds from his pill box before beginning bubble packs on Wednesday of this week.  I will visit him again Tuesday next week.    Renee Ramus, [place] 12/14/2022

## 2022-12-18 ENCOUNTER — Other Ambulatory Visit: Payer: Self-pay | Admitting: *Deleted

## 2022-12-18 MED ORDER — ENTRESTO 24-26 MG PO TABS: 1.0000 | ORAL_TABLET | Freq: Two times a day (BID) | ORAL | 3 refills | Status: DC

## 2022-12-22 ENCOUNTER — Ambulatory Visit (INDEPENDENT_AMBULATORY_CARE_PROVIDER_SITE_OTHER): Payer: 59 | Admitting: Podiatry

## 2022-12-22 DIAGNOSIS — L853 Xerosis cutis: Secondary | ICD-10-CM

## 2022-12-22 NOTE — Progress Notes (Signed)
Subjective:  Patient ID: Marcus Tran, male    DOB: December 15, 1950,  MRN: [id number]  Chief Complaint  Patient presents with   Tinea Pedis    72 y.o. male presents with the above complaint.  Patient presents with moderate to severe dry skin.  Patient states that it has been present for quite some time.  He is not the best with medication to help lotion the foot.  He wanted to discuss treatment options for it he has not seen anyone as prior to seeing me for this.  He is not a diabetic.  He has tried over-the-counter options which has not helped.   Review of Systems: Negative except as noted in the HPI. Denies N/V/F/Ch.  Past Medical History:  Diagnosis Date   Acute metabolic encephalopathy 01/25/8118   Acute respiratory failure with hypoxia ([place]) 03/20/2022   Acute respiratory failure with hypoxia and hypercapnia (H[facility]) 03/06/2022   CHF (congestive heart failure) (HCC)    Dyslipidemia    Hypertension     Current Outpatient Medications:    albuterol (VENTOLIN HFA) 108 (90 Base) MCG/ACT inhaler, Inhale 2 puffs into the lungs every 4 (four) hours as needed for wheezing or shortness of breath., Disp: 1 each, Rfl: 11   aspirin EC 81 MG tablet, Take 1 tablet (81 mg total) by mouth daily. Swallow whole., Disp: 90 tablet, Rfl: 3   atorvastatin (LIPITOR) 80 MG tablet, Take 1 tablet (80 mg total) by mouth every evening., Disp: 90 tablet, Rfl: 3   bisoprolol (ZEBETA) 5 MG tablet, TAKE 1 Tablet BY MOUTH ONCE DAILY, Disp: 30 tablet, Rfl: 6   Budeson-Glycopyrrol-Formoterol (BREZTRI AEROSPHERE) 160-9-4.8 MCG/ACT AERO, Inhale 2 puffs into the lungs as needed., Disp: 10.6 g, Rfl: 11   FARXIGA 10 MG TABS tablet, TAKE 1 Tablet BY MOUTH ONCE DAILY, Disp: 30 tablet, Rfl: 5   furosemide (LASIX) 40 MG tablet, TAKE 1 Tablet BY MOUTH TWICE DAILY, Disp: 90 tablet, Rfl: 3   guaiFENesin-dextromethorphan (ROBITUSSIN DM) 100-10 MG/5ML syrup, Take 5 mLs by mouth every 4 (four) hours as needed for cough., Disp: 118 mL, Rfl:  0   ipratropium-albuterol (DUONEB) 0.5-2.5 (3) MG/3ML SOLN, Inhale 3 mLs into the lungs in the morning, at noon, in the evening, and at bedtime., Disp: 360 mL, Rfl: 11   isosorbide-hydrALAZINE (BIDIL) 20-37.5 MG tablet, Take 1 tablet by mouth 3 (three) times daily., Disp: 90 tablet, Rfl: 3   metFORMIN (GLUCOPHAGE) 500 MG tablet, Take 1 tablet (500 mg total) by mouth 2 (two) times daily with a meal., Disp: 60 tablet, Rfl: 0   montelukast (SINGULAIR) 10 MG tablet, Take 10 mg by mouth at bedtime., Disp: , Rfl:    nicotine (NICODERM CQ - DOSED IN MG/24 HOURS) 21 mg/24hr patch, Place 1 patch (21 mg total) onto the skin daily. (Patient not taking: Reported on 12/08/2022), Disp: 28 patch, Rfl: 3   nitroGLYCERIN (NITROSTAT) 0.4 MG SL tablet, Place 1 tablet (0.4 mg total) under the tongue every 5 (five) minutes as needed for chest pain. (Patient not taking: Reported on 11/25/2022), Disp: 25 tablet, Rfl: 12   predniSONE (DELTASONE) 20 MG tablet, Take 2 tablets (40 mg total) by mouth daily with breakfast., Disp: 3 tablet, Rfl: 0   sacubitril-valsartan (ENTRESTO) 24-26 MG, Take 1 tablet by mouth 2 (two) times daily., Disp: 90 tablet, Rfl: 3   spironolactone (ALDACTONE) 25 MG tablet, Take 12.5 mg by mouth daily., Disp: , Rfl:   Social History   Tobacco Use  Smoking Status Some  Days   Packs/day: 0.50   Years: 50.00   Total pack years: 25.00   Types: Cigarettes   Last attempt to quit: 03/19/2022   Years since quitting: 0.7  Smokeless Tobacco Never  Tobacco Comments   Smoking cessation    No Known Allergies Objective:  There were no vitals filed for this visit. There is no height or weight on file to calculate BMI. Constitutional Well developed. Well nourished.  Vascular Dorsalis pedis pulses palpable bilaterally. Posterior tibial pulses palpable bilaterally. Capillary refill normal to all digits.  No cyanosis or clubbing noted. Pedal hair growth normal.  Neurologic Normal speech. Oriented to  person, place, and time. Epicritic sensation to light touch grossly present bilaterally.  Dermatologic Moderate dry skin noted to bilateral lower extremity to the foot.  No open skin fissures noted no wounds noted.  Orthopedic: Normal joint ROM without pain or crepitus bilaterally. No visible deformities. No bony tenderness.   Radiographs: None Assessment:   1. Xerosis of skin    Plan:  Patient was evaluated and treated and all questions answered.  Bilateral xerosis skin -I explained to the patient the etiology of xerosis and various treatment options were extensively discussed.  I explained to the patient the importance of maintaining moisturization of the skin with application of over-the-counter lotion such as Eucerin or Luciderm.  I believe patient will benefit from ammonium lactate.  I have asked him to apply twice a day.  He states understand will do so immediately   No follow-ups on file.

## 2022-12-23 ENCOUNTER — Other Ambulatory Visit (HOSPITAL_COMMUNITY): Payer: Self-pay | Admitting: Emergency Medicine

## 2022-12-23 NOTE — Progress Notes (Signed)
Paramedicine Encounter    Patient ID: Marcus Tran, male    DOB: 1950-11-16, 72 y.o.   MRN: [id number]   Complaints None  Assessment No chest pain, no SOB  Compliance with meds  missed 1 full day of meds and 1 additional a.m, dosing.  Somehow got off on bubble packs.  Reviewed same.  Pill box filled bubble packs  Refills needed none  Meds changes since last visit none    Social changes None   BP 110/70 (BP Location: Right Arm, Patient Position: Standing, Cuff Size: Normal)   Pulse 76   Resp 14   SpO2 100%  Weight yesterday-not taken Last visit weight-147lb  Today is first home visit with review since he has started with his bubble packs.  He got off 1 full day on his pills but he couldn't explain how.  I spent additional time with him to review how to utilize the bubble packs and I now believe he's got a better understanding of same. I will review at next home visit for compliance. I picked up some fresh vegetables and canned goods from the [place]. [employer] and delivered them to him as well.    ACTION: Home visit completed  [name] [id number] 12/23/22  Patient Care Team: Cipriano Mile, NP as PCP - Cheral Bay, MD as PCP - Cardiology (Cardiology)  Patient Active Problem List   Diagnosis Date Noted   Adenovirus pneumonia 11/12/2022   COPD exacerbation ([place]) 08/13/2022   Chronic HFrEF (heart failure with reduced ejection fraction) ([place]) 08/13/2022   DM2 (diabetes mellitus, type 2) ([place]) 08/13/2022   Obstructive sleep apnea (adult) (pediatric) 08/08/2022   Acute respiratory failure with hypoxia ([place]) 05/04/2022   Third degree heart block ([place]) 04/15/2022   Snoring 04/15/2022   Nonischemic cardiomyopathy ([place]) 04/15/2022   HFrEF (heart failure with reduced ejection fraction) ([place]) 04/15/2022   COPD with acute exacerbation ([place]) 03/22/2022   Acute on chronic combined systolic and diastolic CHF (congestive heart failure) ([place])     Tobacco use disorder 03/08/2022   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   HTN (hypertension) 03/08/2022   Stage 3a chronic kidney disease (CKD) ([place]) 03/08/2022   Coronary artery disease 03/08/2022    Current Outpatient Medications:    albuterol (VENTOLIN HFA) 108 (90 Base) MCG/ACT inhaler, Inhale 2 puffs into the lungs every 4 (four) hours as needed for wheezing or shortness of breath., Disp: 1 each, Rfl: 11   aspirin EC 81 MG tablet, Take 1 tablet (81 mg total) by mouth daily. Swallow whole., Disp: 90 tablet, Rfl: 3   atorvastatin (LIPITOR) 80 MG tablet, Take 1 tablet (80 mg total) by mouth every evening., Disp: 90 tablet, Rfl: 3   bisoprolol (ZEBETA) 5 MG tablet, TAKE 1 Tablet BY MOUTH ONCE DAILY, Disp: 30 tablet, Rfl: 6   Budeson-Glycopyrrol-Formoterol (BREZTRI AEROSPHERE) 160-9-4.8 MCG/ACT AERO, Inhale 2 puffs into the lungs as needed., Disp: 10.6 g, Rfl: 11   FARXIGA 10 MG TABS tablet, TAKE 1 Tablet BY MOUTH ONCE DAILY, Disp: 30 tablet, Rfl: 5   furosemide (LASIX) 40 MG tablet, TAKE 1 Tablet BY MOUTH TWICE DAILY, Disp: 90 tablet, Rfl: 3   guaiFENesin-dextromethorphan (ROBITUSSIN DM) 100-10 MG/5ML syrup, Take 5 mLs by mouth every 4 (four) hours as needed for cough., Disp: 118 mL, Rfl: 0   ipratropium-albuterol (DUONEB) 0.5-2.5 (3) MG/3ML SOLN, Inhale 3 mLs into the lungs in the morning, at noon, in the evening, and at bedtime., Disp: 360 mL, Rfl: 11  isosorbide-hydrALAZINE (BIDIL) 20-37.5 MG tablet, Take 1 tablet by mouth 3 (three) times daily., Disp: 90 tablet, Rfl: 3   metFORMIN (GLUCOPHAGE) 500 MG tablet, Take 1 tablet (500 mg total) by mouth 2 (two) times daily with a meal., Disp: 60 tablet, Rfl: 0   montelukast (SINGULAIR) 10 MG tablet, Take 10 mg by mouth at bedtime., Disp: , Rfl:    nicotine (NICODERM CQ - DOSED IN MG/24 HOURS) 21 mg/24hr patch, Place 1 patch (21 mg total) onto the skin daily. (Patient not taking: Reported on 12/08/2022), Disp: 28 patch, Rfl: 3    nitroGLYCERIN (NITROSTAT) 0.4 MG SL tablet, Place 1 tablet (0.4 mg total) under the tongue every 5 (five) minutes as needed for chest pain. (Patient not taking: Reported on 11/25/2022), Disp: 25 tablet, Rfl: 12   predniSONE (DELTASONE) 20 MG tablet, Take 2 tablets (40 mg total) by mouth daily with breakfast., Disp: 3 tablet, Rfl: 0   sacubitril-valsartan (ENTRESTO) 24-26 MG, Take 1 tablet by mouth 2 (two) times daily., Disp: 90 tablet, Rfl: 3   spironolactone (ALDACTONE) 25 MG tablet, Take 12.5 mg by mouth daily., Disp: , Rfl:  No Known Allergies   Social History   Socioeconomic History   Marital status: Single    Spouse name: Not on file   Number of children: 1   Years of education: Not on file   Highest education level: 10th grade  Occupational History   Occupation: Disability  Tobacco Use   Smoking status: Some Days    Packs/day: 0.50    Years: 50.00    Total pack years: 25.00    Types: Cigarettes    Last attempt to quit: 03/19/2022    Years since quitting: 0.7   Smokeless tobacco: Never   Tobacco comments:    Smoking cessation  Vaping Use   Vaping Use: Never used  Substance and Sexual Activity   Alcohol use: Not on file    Comment: socially   Drug use: Yes    Types: Marijuana    Comment: past   Sexual activity: Not on file  Other Topics Concern   Not on file  Social History Narrative   He lives with his daughter apparently and 5 grandchildren.  He reports that he smokes probably less than a pack of cigarettes a day.  He occasionally drinks a beer or liquor but not daily.   Social Determinants of Health   Financial Resource Strain: Low Risk  (03/11/2022)   Overall Financial Resource Strain (CARDIA)    Difficulty of Paying Living Expenses: Not very hard  Food Insecurity: No Food Insecurity (11/11/2022)   Hunger Vital Sign    Worried About Running Out of Food in the Last Year: Never true    Ran Out of Food in the Last Year: Never true  Recent Concern: Food Insecurity -  Food Insecurity Present (10/26/2022)   Hunger Vital Sign    Worried About Charity fundraiser in the Last Year: Often true    Ran Out of Food in the Last Year: Often true  Transportation Needs: Unmet Transportation Needs (11/18/2022)   PRAPARE - Hydrologist (Medical): Yes    Lack of Transportation (Non-Medical): Yes  Physical Activity: Not on file  Stress: Not on file  Social Connections: Not on file  Intimate Partner Violence: Not At Risk (11/11/2022)   Humiliation, Afraid, Rape, and Kick questionnaire    Fear of Current or Ex-Partner: No    Emotionally Abused: No  Physically Abused: No    Sexually Abused: No    Physical Exam      Future Appointments  Date Time Provider [place]  12/31/2022 11:30 AM Evans Lance, MD [id number]

## 2022-12-24 ENCOUNTER — Telehealth: Payer: Self-pay

## 2022-12-24 NOTE — Telephone Encounter (Signed)
Got a letter from [place] [facility] and can we start the process for a prior auth for his  Aluterol Aer HFA  Please and thank you

## 2022-12-28 ENCOUNTER — Other Ambulatory Visit (HOSPITAL_COMMUNITY): Payer: Self-pay

## 2022-12-28 NOTE — Telephone Encounter (Signed)
Per test claim PA not needed for Albuterol HFA 8.5gm. Refill is too soon due to last date of fill.

## 2022-12-30 ENCOUNTER — Telehealth (HOSPITAL_COMMUNITY): Payer: Self-pay | Admitting: Licensed Clinical Social Worker

## 2022-12-30 NOTE — Telephone Encounter (Signed)
H&V Care Navigation CSW Progress Note  Clinical Social Worker set up ride to pt [employer] for pt tomorrow through [employer].  Pt already signed voucher so CSW reviewed consent verbally with pt.   SDOH Screenings   Food Insecurity: No Food Insecurity (11/11/2022)  Recent Concern: Food Insecurity - Food Insecurity Present (10/26/2022)  Housing: Low Risk  (11/11/2022)  Transportation Needs: Unmet Transportation Needs (12/30/2022)  Utilities: Not At Risk (11/11/2022)  Alcohol Screen: Low Risk  (05/06/2022)  Financial Resource Strain: Low Risk  (03/11/2022)  Tobacco Use: High Risk (11/23/2022)   12/30/2022  Marcus Tran DOB: 01-Apr-1951 MRN: [id number]   RIDER WAIVER AND RELEASE OF LIABILITY  For the purposes of helping with transportation needs, [facility] partners with outside transportation providers (taxi companies, [place], Social research officer, government.) to give [employer] patients or other approved people the choice of on-demand rides [employer]") to our buildings for non-emergency visits.  By using [employer], I, the person signing this document, on behalf of myself and/or any legal minors (in my care using the [employer]), agree:  Government social research officer given to me are supplied by independent, outside transportation providers who do not work for, or have any affiliation with, [employer]. [facility] is not a transportation company. [facility] has no control over the quality or safety of the rides I get using [employer]. [facility] has no control over whether any outside ride will happen on time or not. [facility] gives no guarantee on the reliability, quality, safety, or availability on any rides, or that no mistakes will happen. I know and accept that traveling by vehicle (car, truck, SVU, Lucianne Lei, bus, taxi, etc.) has risks of serious injuries such as disability, being paralyzed, and death. I know and agree the risk of using [employer] is mine alone, and not [employer]. Transport Services are provided "as is" and as are available. The transportation providers are in charge for all inspections and care of the vehicles used to provide these rides. I agree not to take legal action against [facility], its agents, employees, officers, directors, representatives, insurers, attorneys, assigns, successors, subsidiaries, and affiliates at any time for any reasons related directly or indirectly to using [employer]. I also agree not to take legal action against [facility] or its affiliates for any injury, death, or damage to property caused by or related to using [employer]. I have read this Waiver and Release of Liability, and I understand the terms used in it and their legal meaning. This Waiver is freely and voluntarily given with the understanding that my right (or any legal minors) to legal action against [facility] relating to [employer] is knowingly given up to use these services.   I attest that I read the Ride Waiver and Release of Liability to Marcus Tran, gave Mr. Hamberg the opportunity to ask questions and answered the questions asked (if any). I affirm that Marcus Tran then provided consent for assistance with transportation.     Jorge Ny

## 2022-12-30 NOTE — Telephone Encounter (Signed)
CSW informed by [employer] that pt had complications getting ride to his appt tomorrow and is in need of assistance.  CSW attempted to call pt to discuss and assist as able- unable to reach- left VM requesting return call  Jorge Ny, [facility] Desk#: [phone] Cell#: [phone]

## 2022-12-31 ENCOUNTER — Encounter: Payer: Self-pay | Admitting: Internal Medicine

## 2022-12-31 ENCOUNTER — Ambulatory Visit: Payer: 59 | Attending: Internal Medicine | Admitting: Internal Medicine

## 2022-12-31 VITALS — BP 132/80 | HR 79 | Ht 65.5 in | Wt 153.0 lb

## 2022-12-31 DIAGNOSIS — I5043 Acute on chronic combined systolic (congestive) and diastolic (congestive) heart failure: Secondary | ICD-10-CM | POA: Diagnosis not present

## 2022-12-31 DIAGNOSIS — I428 Other cardiomyopathies: Secondary | ICD-10-CM

## 2022-12-31 DIAGNOSIS — I2510 Atherosclerotic heart disease of native coronary artery without angina pectoris: Secondary | ICD-10-CM | POA: Diagnosis not present

## 2022-12-31 NOTE — Progress Notes (Signed)
HPI Mr. Eskra returns today for ongoing evaluation of PVC's, nocturnal bradycardia and LV dysfunction with a h/o syncope. He presented in 5/23 with brief syncope and was found to have LV dysfunction, and was treated with maximal GDMT and his EF has improved.  He has not had more syncope. A 14 day monitor demonstrated NSR with nocturnal complete heart block, NSVT, and RBBB. He has tolerated his mediations. He denies sob but admits to not being particularly active.   No Known Allergies   Current Outpatient Medications  Medication Sig Dispense Refill   albuterol (VENTOLIN HFA) 108 (90 Base) MCG/ACT inhaler Inhale 2 puffs into the lungs every 4 (four) hours as needed for wheezing or shortness of breath. 1 each 11   aspirin EC 81 MG tablet Take 1 tablet (81 mg total) by mouth daily. Swallow whole. 90 tablet 3   atorvastatin (LIPITOR) 80 MG tablet Take 1 tablet (80 mg total) by mouth every evening. 90 tablet 3   bisoprolol (ZEBETA) 5 MG tablet TAKE 1 Tablet BY MOUTH ONCE DAILY 30 tablet 6   Budeson-Glycopyrrol-Formoterol (BREZTRI AEROSPHERE) 160-9-4.8 MCG/ACT AERO Inhale 2 puffs into the lungs as needed. 10.6 g 11   FARXIGA 10 MG TABS tablet TAKE 1 Tablet BY MOUTH ONCE DAILY 30 tablet 5   furosemide (LASIX) 40 MG tablet TAKE 1 Tablet BY MOUTH TWICE DAILY 90 tablet 3   guaiFENesin-dextromethorphan (ROBITUSSIN DM) 100-10 MG/5ML syrup Take 5 mLs by mouth every 4 (four) hours as needed for cough. 118 mL 0   ipratropium-albuterol (DUONEB) 0.5-2.5 (3) MG/3ML SOLN Inhale 3 mLs into the lungs in the morning, at noon, in the evening, and at bedtime. 360 mL 11   isosorbide-hydrALAZINE (BIDIL) 20-37.5 MG tablet Take 1 tablet by mouth 3 (three) times daily. 90 tablet 3   metFORMIN (GLUCOPHAGE) 500 MG tablet Take 1 tablet (500 mg total) by mouth 2 (two) times daily with a meal. 60 tablet 0   montelukast (SINGULAIR) 10 MG tablet Take 10 mg by mouth at bedtime.     predniSONE (DELTASONE) 20 MG tablet Take 2  tablets (40 mg total) by mouth daily with breakfast. 3 tablet 0   sacubitril-valsartan (ENTRESTO) 24-26 MG Take 1 tablet by mouth 2 (two) times daily. 90 tablet 3   spironolactone (ALDACTONE) 25 MG tablet Take 12.5 mg by mouth daily.     No current facility-administered medications for this visit.     Past Medical History:  Diagnosis Date   Acute metabolic encephalopathy 99991111   Acute respiratory failure with hypoxia ([place]) 03/20/2022   Acute respiratory failure with hypoxia and hypercapnia ([facility]) 03/06/2022   CHF (congestive heart failure) ([facility])    Dyslipidemia    Hypertension     ROS:   All systems reviewed and negative except as noted in the HPI.   Past Surgical History:  Procedure Laterality Date   LEG SURGERY     Right-sided as result of trauma   RIGHT/LEFT HEART CATH AND CORONARY ANGIOGRAPHY N/A 03/09/2022   Procedure: RIGHT/LEFT HEART CATH AND CORONARY ANGIOGRAPHY;  Surgeon: Lorretta Harp, MD;  Location: [facility];  Service: Cardiovascular;  Laterality: N/A;   Umbilicus surgery     As a child     Family History  Problem Relation Age of Onset   Hypertension Mother      Social History   Socioeconomic History   Marital status: Single    Spouse name: Not on file   Number of  children: 1   Years of education: Not on file   Highest education level: 10th grade  Occupational History   Occupation: Disability  Tobacco Use   Smoking status: Some Days    Packs/day: 0.50    Years: 50.00    Additional pack years: 0.00    Total pack years: 25.00    Types: Cigarettes    Last attempt to quit: 03/19/2022    Years since quitting: 0.7   Smokeless tobacco: Never   Tobacco comments:    Smoking cessation  Vaping Use   Vaping Use: Never used  Substance and Sexual Activity   Alcohol use: Not on file    Comment: socially   Drug use: Yes    Types: Marijuana    Comment: past   Sexual activity: Not on file  Other Topics Concern   Not on file  Social History  Narrative   He lives with his daughter apparently and 5 grandchildren.  He reports that he smokes probably less than a pack of cigarettes a day.  He occasionally drinks a beer or liquor but not daily.   Social Determinants of Health   Financial Resource Strain: Low Risk  (03/11/2022)   Overall Financial Resource Strain (CARDIA)    Difficulty of Paying Living Expenses: Not very hard  Food Insecurity: No Food Insecurity (11/11/2022)   Hunger Vital Sign    Worried About Running Out of Food in the Last Year: Never true    Ran Out of Food in the Last Year: Never true  Recent Concern: Food Insecurity - Food Insecurity Present (10/26/2022)   Hunger Vital Sign    Worried About Charity fundraiser in the Last Year: Often true    Ran Out of Food in the Last Year: Often true  Transportation Needs: Unmet Transportation Needs (12/30/2022)   PRAPARE - Hydrologist (Medical): Yes    Lack of Transportation (Non-Medical): Yes  Physical Activity: Not on file  Stress: Not on file  Social Connections: Not on file  Intimate Partner Violence: Not At Risk (11/11/2022)   Humiliation, Afraid, Rape, and Kick questionnaire    Fear of Current or Ex-Partner: No    Emotionally Abused: No    Physically Abused: No    Sexually Abused: No     BP 132/80   Pulse 79   Ht 5' 5.5" (1.664 m)   Wt 153 lb (69.4 kg)   SpO2 95%   BMI 25.07 kg/m   Physical Exam:  Well appearing 72 yo man, NAD HEENT: Unremarkable Neck:  No JVD, no thyromegally Lymphatics:  No adenopathy Back:  No CVA tenderness Lungs:  Clear with no wheezes HEART:  Regular rate rhythm, no murmurs, no rubs, no clicks Abd:  soft, positive bowel sounds, no organomegally, no rebound, no guarding Ext:  2 plus pulses, no edema, no cyanosis, no clubbing Skin:  No rashes no nodules Neuro:  CN II through XII intact, motor grossly intact  EKG - NSR with RBBB  DEVICE  Normal device function.  See PaceArt for details.    Assess/Plan:  Syncope - he has not had any since getting on GDMT. If he has more, I would recommend insertion of an ILR. Chronic systolic heart failure - he has had improvement in his LV function. I would not recommend a change in meds or an ICD at this point in time.  NSVT - he has been asymptomatic with only rare palpitations. Continue beta blocker. No indication  for any AA drug therapy.  Carleene Overlie Ovie Eastep,MD

## 2022-12-31 NOTE — Patient Instructions (Addendum)
Medication Instructions:  Your physician recommends that you continue on your current medications as directed. Please refer to the Current Medication list given to you today.  *If you need a refill on your cardiac medications before your next appointment, please call your pharmacy*  Lab Work: None ordered.  If you have labs (blood work) drawn today and your tests are completely normal, you will receive your results only by: MyChart Message (if you have MyChart) OR A paper copy in the mail If you have any lab test that is abnormal or we need to change your treatment, we will call you to review the results.  Testing/Procedures: None ordered.  Follow-Up: At [facility], you and your health needs are our priority.  As part of our continuing mission to provide you with exceptional heart care, we have created designated Provider Care Teams.  These Care Teams include your primary Cardiologist (physician) and Advanced Practice Providers (APPs -  Physician Assistants and Nurse Practitioners) who all work together to provide you with the care you need, when you need it.  We recommend signing up for the patient portal called "MyChart".  Sign up information is provided on this After Visit Summary.  MyChart is used to connect with patients for Virtual Visits (Telemedicine).  Patients are able to view lab/test results, encounter notes, upcoming appointments, etc.  Non-urgent messages can be sent to your provider as well.   To learn more about what you can do with MyChart, go to [email or web address].    Your next appointment:   AS NEEDED   The format for your next appointment:   In Person  Provider:   Gregg Taylor, MD{or one of the following Advanced Practice Providers on your designated Care Team:   Renee Ursuy, PA-C Michael "Andy" Tillery, PA-C        

## 2023-01-06 ENCOUNTER — Other Ambulatory Visit (HOSPITAL_COMMUNITY): Payer: Self-pay | Admitting: Emergency Medicine

## 2023-01-06 NOTE — Progress Notes (Signed)
Paramedicine Encounter    Patient ID: Marcus Tran, male    DOB: Nov 30, 1950, 72 y.o.   MRN: [id number]   Complaints seasonal allergies w/ stuffy nose and congested cough w/ green sputum  Assessment no chest pain or SOB, no edema.  Lung sounds w/ ronchi and mild wheezing throughout  Compliance with meds 100%   Pill box filled bubble packs  Refills needed NONE  Meds changes since last visit NONW    Social changes NONE   BP 110/70 (BP Location: Right Arm, Patient Position: Sitting, Cuff Size: Normal)   Pulse 97   Wt 149 lb 4.8 oz (67.7 kg)   SpO2 96%   BMI 24.47 kg/m  Weight yesterday-not taken Last visit weight-147lb  Marcus Tran has been doing really well with his bubble packs.  I reviewed for accuracy.  He has no complaints of chest pain or SOB.  Lung sounds at baseline with ronchi throughout.  He states he has a productive cough with yellow/green sputum.  He denies fever.  He states he is having trouble breathing through his nose.  I recommended he seek further evaluation either at an urgent care or through his PCP.  He says he does not want to do that at this time. Advised him should his symptoms get worse he should reach out to [phone].  ACTION: Home visit completed  [name] [id number] 01/06/23  Patient Care Team: Cipriano Mile, NP as PCP - Cheral Bay, MD as PCP - Cardiology (Cardiology)  Patient Active Problem List   Diagnosis Date Noted   Adenovirus pneumonia 11/12/2022   COPD exacerbation ([place]) 08/13/2022   Chronic HFrEF (heart failure with reduced ejection fraction) ([place]) 08/13/2022   DM2 (diabetes mellitus, type 2) ([place]) 08/13/2022   Obstructive sleep apnea (adult) (pediatric) 08/08/2022   Acute respiratory failure with hypoxia ([place]) 05/04/2022   Third degree heart block ([place]) 04/15/2022   Snoring 04/15/2022   Nonischemic cardiomyopathy ([place]) 04/15/2022   HFrEF (heart failure with reduced ejection fraction) ([place]) 04/15/2022    COPD with acute exacerbation ([place]) 03/22/2022   Acute on chronic combined systolic and diastolic CHF (congestive heart failure) ([place])    Tobacco use disorder 03/08/2022   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   HTN (hypertension) 03/08/2022   Stage 3a chronic kidney disease (CKD) ([place]) 03/08/2022   Coronary artery disease 03/08/2022    Current Outpatient Medications:    albuterol (VENTOLIN HFA) 108 (90 Base) MCG/ACT inhaler, Inhale 2 puffs into the lungs every 4 (four) hours as needed for wheezing or shortness of breath., Disp: 1 each, Rfl: 11   aspirin EC 81 MG tablet, Take 1 tablet (81 mg total) by mouth daily. Swallow whole., Disp: 90 tablet, Rfl: 3   atorvastatin (LIPITOR) 80 MG tablet, Take 1 tablet (80 mg total) by mouth every evening., Disp: 90 tablet, Rfl: 3   bisoprolol (ZEBETA) 5 MG tablet, TAKE 1 Tablet BY MOUTH ONCE DAILY, Disp: 30 tablet, Rfl: 6   Budeson-Glycopyrrol-Formoterol (BREZTRI AEROSPHERE) 160-9-4.8 MCG/ACT AERO, Inhale 2 puffs into the lungs as needed., Disp: 10.6 g, Rfl: 11   FARXIGA 10 MG TABS tablet, TAKE 1 Tablet BY MOUTH ONCE DAILY, Disp: 30 tablet, Rfl: 5   furosemide (LASIX) 40 MG tablet, TAKE 1 Tablet BY MOUTH TWICE DAILY, Disp: 90 tablet, Rfl: 3   guaiFENesin-dextromethorphan (ROBITUSSIN DM) 100-10 MG/5ML syrup, Take 5 mLs by mouth every 4 (four) hours as needed for cough., Disp: 118 mL, Rfl: 0   ipratropium-albuterol (DUONEB) 0.5-2.5 (3)  MG/3ML SOLN, Inhale 3 mLs into the lungs in the morning, at noon, in the evening, and at bedtime., Disp: 360 mL, Rfl: 11   isosorbide-hydrALAZINE (BIDIL) 20-37.5 MG tablet, Take 1 tablet by mouth 3 (three) times daily., Disp: 90 tablet, Rfl: 3   metFORMIN (GLUCOPHAGE) 500 MG tablet, Take 1 tablet (500 mg total) by mouth 2 (two) times daily with a meal., Disp: 60 tablet, Rfl: 0   montelukast (SINGULAIR) 10 MG tablet, Take 10 mg by mouth at bedtime., Disp: , Rfl:    predniSONE (DELTASONE) 20 MG tablet, Take 2 tablets (40  mg total) by mouth daily with breakfast., Disp: 3 tablet, Rfl: 0   sacubitril-valsartan (ENTRESTO) 24-26 MG, Take 1 tablet by mouth 2 (two) times daily., Disp: 90 tablet, Rfl: 3   spironolactone (ALDACTONE) 25 MG tablet, Take 12.5 mg by mouth daily., Disp: , Rfl:  No Known Allergies   Social History   Socioeconomic History   Marital status: Single    Spouse name: Not on file   Number of children: 1   Years of education: Not on file   Highest education level: 10th grade  Occupational History   Occupation: Disability  Tobacco Use   Smoking status: Some Days    Packs/day: 0.50    Years: 50.00    Additional pack years: 0.00    Total pack years: 25.00    Types: Cigarettes    Last attempt to quit: 03/19/2022    Years since quitting: 0.8   Smokeless tobacco: Never   Tobacco comments:    Smoking cessation  Vaping Use   Vaping Use: Never used  Substance and Sexual Activity   Alcohol use: Not on file    Comment: socially   Drug use: Yes    Types: Marijuana    Comment: past   Sexual activity: Not on file  Other Topics Concern   Not on file  Social History Narrative   He lives with his daughter apparently and 5 grandchildren.  He reports that he smokes probably less than a pack of cigarettes a day.  He occasionally drinks a beer or liquor but not daily.   Social Determinants of Health   Financial Resource Strain: Low Risk  (03/11/2022)   Overall Financial Resource Strain (CARDIA)    Difficulty of Paying Living Expenses: Not very hard  Food Insecurity: No Food Insecurity (11/11/2022)   Hunger Vital Sign    Worried About Running Out of Food in the Last Year: Never true    Ran Out of Food in the Last Year: Never true  Recent Concern: Food Insecurity - Food Insecurity Present (10/26/2022)   Hunger Vital Sign    Worried About Charity fundraiser in the Last Year: Often true    Ran Out of Food in the Last Year: Often true  Transportation Needs: Unmet Transportation Needs (12/30/2022)    PRAPARE - Hydrologist (Medical): Yes    Lack of Transportation (Non-Medical): Yes  Physical Activity: Not on file  Stress: Not on file  Social Connections: Not on file  Intimate Partner Violence: Not At Risk (11/11/2022)   Humiliation, Afraid, Rape, and Kick questionnaire    Fear of Current or Ex-Partner: No    Emotionally Abused: No    Physically Abused: No    Sexually Abused: No    Physical Exam      No future appointments.

## 2023-01-12 ENCOUNTER — Encounter (HOSPITAL_COMMUNITY): Payer: Self-pay

## 2023-01-12 ENCOUNTER — Emergency Department (HOSPITAL_COMMUNITY): Payer: 59

## 2023-01-12 ENCOUNTER — Inpatient Hospital Stay (HOSPITAL_COMMUNITY)
Admission: EM | Admit: 2023-01-12 | Discharge: 2023-01-16 | DRG: 189 | Disposition: A | Discharge status: Home / Self Care | Payer: 59 | Attending: Student | Admitting: Student

## 2023-01-12 DIAGNOSIS — R54 Age-related physical debility: Secondary | ICD-10-CM | POA: Diagnosis present

## 2023-01-12 DIAGNOSIS — I2510 Atherosclerotic heart disease of native coronary artery without angina pectoris: Secondary | ICD-10-CM | POA: Diagnosis present

## 2023-01-12 DIAGNOSIS — Z7951 Long term (current) use of inhaled steroids: Secondary | ICD-10-CM

## 2023-01-12 DIAGNOSIS — N179 Acute kidney failure, unspecified: Secondary | ICD-10-CM | POA: Diagnosis present

## 2023-01-12 DIAGNOSIS — Z91148 Patient's other noncompliance with medication regimen for other reason: Secondary | ICD-10-CM | POA: Diagnosis not present

## 2023-01-12 DIAGNOSIS — F17210 Nicotine dependence, cigarettes, uncomplicated: Secondary | ICD-10-CM | POA: Diagnosis present

## 2023-01-12 DIAGNOSIS — I472 Ventricular tachycardia, unspecified: Secondary | ICD-10-CM | POA: Diagnosis not present

## 2023-01-12 DIAGNOSIS — Z91199 Patient's noncompliance with other medical treatment and regimen due to unspecified reason: Secondary | ICD-10-CM | POA: Diagnosis not present

## 2023-01-12 DIAGNOSIS — I428 Other cardiomyopathies: Secondary | ICD-10-CM | POA: Diagnosis present

## 2023-01-12 DIAGNOSIS — E1122 Type 2 diabetes mellitus with diabetic chronic kidney disease: Secondary | ICD-10-CM | POA: Diagnosis present

## 2023-01-12 DIAGNOSIS — F1490 Cocaine use, unspecified, uncomplicated: Secondary | ICD-10-CM | POA: Insufficient documentation

## 2023-01-12 DIAGNOSIS — I10 Essential (primary) hypertension: Secondary | ICD-10-CM | POA: Diagnosis present

## 2023-01-12 DIAGNOSIS — Z79899 Other long term (current) drug therapy: Secondary | ICD-10-CM | POA: Diagnosis not present

## 2023-01-12 DIAGNOSIS — J9601 Acute respiratory failure with hypoxia: Secondary | ICD-10-CM | POA: Diagnosis present

## 2023-01-12 DIAGNOSIS — Z7982 Long term (current) use of aspirin: Secondary | ICD-10-CM

## 2023-01-12 DIAGNOSIS — J441 Chronic obstructive pulmonary disease with (acute) exacerbation: Secondary | ICD-10-CM | POA: Diagnosis present

## 2023-01-12 DIAGNOSIS — Z8249 Family history of ischemic heart disease and other diseases of the circulatory system: Secondary | ICD-10-CM | POA: Diagnosis not present

## 2023-01-12 DIAGNOSIS — N1831 Chronic kidney disease, stage 3a: Secondary | ICD-10-CM | POA: Diagnosis present

## 2023-01-12 DIAGNOSIS — Z7984 Long term (current) use of oral hypoglycemic drugs: Secondary | ICD-10-CM

## 2023-01-12 DIAGNOSIS — R0602 Shortness of breath: Secondary | ICD-10-CM | POA: Diagnosis present

## 2023-01-12 DIAGNOSIS — F17200 Nicotine dependence, unspecified, uncomplicated: Secondary | ICD-10-CM | POA: Diagnosis present

## 2023-01-12 DIAGNOSIS — I5023 Acute on chronic systolic (congestive) heart failure: Secondary | ICD-10-CM | POA: Diagnosis present

## 2023-01-12 DIAGNOSIS — I5042 Chronic combined systolic (congestive) and diastolic (congestive) heart failure: Secondary | ICD-10-CM | POA: Diagnosis present

## 2023-01-12 DIAGNOSIS — J9602 Acute respiratory failure with hypercapnia: Secondary | ICD-10-CM | POA: Diagnosis present

## 2023-01-12 DIAGNOSIS — I130 Hypertensive heart and chronic kidney disease with heart failure and stage 1 through stage 4 chronic kidney disease, or unspecified chronic kidney disease: Secondary | ICD-10-CM | POA: Diagnosis present

## 2023-01-12 DIAGNOSIS — E785 Hyperlipidemia, unspecified: Secondary | ICD-10-CM | POA: Diagnosis present

## 2023-01-12 DIAGNOSIS — E119 Type 2 diabetes mellitus without complications: Secondary | ICD-10-CM

## 2023-01-12 DIAGNOSIS — E1165 Type 2 diabetes mellitus with hyperglycemia: Secondary | ICD-10-CM | POA: Diagnosis present

## 2023-01-12 DIAGNOSIS — E875 Hyperkalemia: Secondary | ICD-10-CM | POA: Diagnosis present

## 2023-01-12 DIAGNOSIS — M6282 Rhabdomyolysis: Secondary | ICD-10-CM | POA: Diagnosis present

## 2023-01-12 DIAGNOSIS — I5022 Chronic systolic (congestive) heart failure: Secondary | ICD-10-CM | POA: Diagnosis present

## 2023-01-12 DIAGNOSIS — T502X5A Adverse effect of carbonic-anhydrase inhibitors, benzothiadiazides and other diuretics, initial encounter: Secondary | ICD-10-CM | POA: Diagnosis present

## 2023-01-12 DIAGNOSIS — J810 Acute pulmonary edema: Principal | ICD-10-CM

## 2023-01-12 LAB — COMPREHENSIVE METABOLIC PANEL
ALT: 27 U/L (ref 0–44)
AST: 58 U/L — ABNORMAL HIGH (ref 15–41)
Albumin: 4.4 g/dL (ref 3.5–5.0)
Alkaline Phosphatase: 43 U/L (ref 38–126)
Anion gap: 16 — ABNORMAL HIGH (ref 5–15)
BUN: 29 mg/dL — ABNORMAL HIGH (ref 8–23)
CO2: 29 mmol/L (ref 22–32)
Calcium: 8.9 mg/dL (ref 8.9–10.3)
Chloride: 91 mmol/L — ABNORMAL LOW (ref 98–111)
Creatinine, Ser: 2.91 mg/dL — ABNORMAL HIGH (ref 0.61–1.24)
GFR, Estimated: 22 mL/min — ABNORMAL LOW (ref >60)
Glucose, Bld: 107 mg/dL — ABNORMAL HIGH (ref 70–99)
Potassium: 5.5 mmol/L — ABNORMAL HIGH (ref 3.5–5.1)
Sodium: 136 mmol/L (ref 135–145)
Total Bilirubin: 2 mg/dL — ABNORMAL HIGH (ref 0.3–1.2)
Total Protein: 7.5 g/dL (ref 6.5–8.1)

## 2023-01-12 LAB — CBC WITH DIFFERENTIAL/PLATELET

## 2023-01-12 LAB — BRAIN NATRIURETIC PEPTIDE: B Natriuretic Peptide: 184.7 pg/mL — ABNORMAL HIGH (ref 0.0–100.0)

## 2023-01-12 LAB — TROPONIN I (HIGH SENSITIVITY): Troponin I (High Sensitivity): 22 ng/L — ABNORMAL HIGH (ref <18)

## 2023-01-12 MED ORDER — ENALAPRILAT 1.25 MG/ML IV SOLN
1.2500 mg | Freq: Once | INTRAVENOUS | Status: AC
  Administered 2023-01-12: 1.25 mg via INTRAVENOUS
  Filled 2023-01-12 (×2): qty 1

## 2023-01-12 MED ORDER — ALBUTEROL SULFATE (2.5 MG/3ML) 0.083% IN NEBU
10.0000 mg | INHALATION_SOLUTION | Freq: Once | RESPIRATORY_TRACT | Status: AC
  Administered 2023-01-12: 10 mg via RESPIRATORY_TRACT
  Filled 2023-01-12: qty 12

## 2023-01-12 MED ORDER — NITROGLYCERIN IN D5W 200-5 MCG/ML-% IV SOLN
0.0000 ug/min | INTRAVENOUS | Status: DC
  Administered 2023-01-12: 5 ug/min via INTRAVENOUS
  Filled 2023-01-12 (×2): qty 250

## 2023-01-12 MED ORDER — MAGNESIUM SULFATE 2 GM/50ML IV SOLN
2.0000 g | Freq: Once | INTRAVENOUS | Status: AC
  Administered 2023-01-12: 2 g via INTRAVENOUS
  Filled 2023-01-12: qty 50

## 2023-01-12 NOTE — ED Triage Notes (Signed)
PT BIB EMS from home with c/o SOB since this morning. On fire's arrival, pt was 76% on room air. Pt was placed on 4L O2 via [place]. Wheezing on auscultation so pt was given duoneb and 125 mg IV solumedrol. Lung sounds worse after duoneb and pt with increased work of breathing so pt was started on CPAP. 0.3 mg IV epi also given en route. Hx of CHF, COPD, and asthma. Pt used home nebs and albuterol inhaler prior to EMS arrival.

## 2023-01-12 NOTE — ED Provider Notes (Signed)
[facility] Provider Note   CSN: [id number] Arrival date & time: 01/12/23  2129     History  Chief Complaint  Patient presents with   Shortness of Breath    Marcus Tran is a 72 y.o. male.  72 yo M with a chief complaints of difficulty breathing.  This been going on for the better part of the week but got acutely worse this evening.  Was initially placed on 4 L oxygen with fire and rescue, was found to be wheezing and so was given DuoNebs and steroids and had acute worsening and was placed on BiPAP and given a dose of epinephrine.  Has had some improvement with that.  Significantly hypertensive en route.  Patient denies any chest pain denies any significant cough.  Denies fevers.   Shortness of Breath      Home Medications Prior to Admission medications   Medication Sig Start Date End Date Taking? Authorizing Provider  albuterol (VENTOLIN HFA) 108 (90 Base) MCG/ACT inhaler Inhale 2 puffs into the lungs every 4 (four) hours as needed for wheezing or shortness of breath. 04/06/22   Hunsucker, Bonna Gains, MD  aspirin EC 81 MG tablet Take 1 tablet (81 mg total) by mouth daily. Swallow whole. 03/24/22   Loel Dubonnet, NP  atorvastatin (LIPITOR) 80 MG tablet Take 1 tablet (80 mg total) by mouth every evening. 03/24/22   Loel Dubonnet, NP  bisoprolol (ZEBETA) 5 MG tablet TAKE 1 Tablet BY MOUTH ONCE DAILY 08/28/22   Minus Breeding, MD  Budeson-Glycopyrrol-Formoterol (BREZTRI AEROSPHERE) 160-9-4.8 MCG/ACT AERO Inhale 2 puffs into the lungs as needed. 05/21/22   Hunsucker, Bonna Gains, MD  FARXIGA 10 MG TABS tablet TAKE 1 Tablet BY MOUTH ONCE DAILY 08/26/22   Loel Dubonnet, NP  furosemide (LASIX) 40 MG tablet TAKE 1 Tablet BY MOUTH TWICE DAILY 09/25/22   Minus Breeding, MD  guaiFENesin-dextromethorphan (ROBITUSSIN DM) 100-10 MG/5ML syrup Take 5 mLs by mouth every 4 (four) hours as needed for cough. 11/12/22   Johny Blamer, DO   ipratropium-albuterol (DUONEB) 0.5-2.5 (3) MG/3ML SOLN Inhale 3 mLs into the lungs in the morning, at noon, in the evening, and at bedtime. 05/21/22   Hunsucker, Bonna Gains, MD  isosorbide-hydrALAZINE (BIDIL) 20-37.5 MG tablet Take 1 tablet by mouth 3 (three) times daily. 03/24/22   Loel Dubonnet, NP  metFORMIN (GLUCOPHAGE) 500 MG tablet Take 1 tablet (500 mg total) by mouth 2 (two) times daily with a meal. 05/07/22   Thurnell Lose, MD  montelukast (SINGULAIR) 10 MG tablet Take 10 mg by mouth at bedtime.    [provider]  predniSONE (DELTASONE) 20 MG tablet Take 2 tablets (40 mg total) by mouth daily with breakfast. 11/13/22   Johny Blamer, DO  sacubitril-valsartan (ENTRESTO) 24-26 MG Take 1 tablet by mouth 2 (two) times daily. 12/18/22   Minus Breeding, MD  spironolactone (ALDACTONE) 25 MG tablet Take 12.5 mg by mouth daily.    [provider]      Allergies    Patient has no known allergies.    Review of Systems   Review of Systems  Respiratory:  Positive for shortness of breath.     Physical Exam Updated Vital Signs BP (!) 131/96   Pulse 87   Resp (!) 32   SpO2 100%  Physical Exam Vitals and nursing note reviewed.  Constitutional:      Appearance: He is well-developed.  HENT:  Head: Normocephalic and atraumatic.  Eyes:     Pupils: Pupils are equal, round, and reactive to light.  Neck:     Vascular: No JVD.  Cardiovascular:     Rate and Rhythm: Normal rate and regular rhythm.     Heart sounds: No murmur heard.    No friction rub. No gallop.  Pulmonary:     Effort: No respiratory distress.     Breath sounds: Wheezing (diffuse end expiratory) present.  Abdominal:     General: There is no distension.     Tenderness: There is no abdominal tenderness. There is no guarding or rebound.  Musculoskeletal:        General: Normal range of motion.     Cervical back: Normal range of motion and neck supple.  Skin:    Coloration: Skin is not pale.      Findings: No rash.  Neurological:     Mental Status: He is alert and oriented to person, place, and time.  Psychiatric:        Behavior: Behavior normal.     ED Results / Procedures / Treatments   Labs (all labs ordered are listed, but only abnormal results are displayed) Labs Reviewed  COMPREHENSIVE METABOLIC PANEL - Abnormal; Notable for the following components:      Result Value   Potassium 5.5 (*)    Chloride 91 (*)    Glucose, Bld 107 (*)    BUN 29 (*)    Creatinine, Ser 2.91 (*)    AST 58 (*)    Total Bilirubin 2.0 (*)    GFR, Estimated 22 (*)    Anion gap 16 (*)    All other components within normal limits  TROPONIN I (HIGH SENSITIVITY) - Abnormal; Notable for the following components:   Troponin I (High Sensitivity) 22 (*)    All other components within normal limits  CBC WITH DIFFERENTIAL/PLATELET  BRAIN NATRIURETIC PEPTIDE    EKG EKG Interpretation  Date/Time:  Tuesday January 12 2023 21:38:04 EDT Ventricular Rate:  102 PR Interval:  169 QRS Duration: 129 QT Interval:  391 QTC Calculation: 510 R Axis:   34 Text Interpretation: Sinus tachycardia Probable left atrial enlargement Right bundle branch block No significant change since last tracing Confirmed by Deno Etienne [phone]) on 01/12/2023 9:43:07 PM  Radiology DG Chest Port 1 View  Result Date: 01/12/2023 CLINICAL DATA:  Shortness of breath EXAM: PORTABLE CHEST 1 VIEW COMPARISON:  11/10/2022 FINDINGS: No acute airspace disease. Enlarged central pulmonary vessels. Normal cardiac size. No pneumothorax IMPRESSION: No active disease. Enlarged central pulmonary vessels suggesting pulmonary arterial hypertension. Electronically Signed   By: Donavan Foil M.D.   On: 01/12/2023 22:00    Procedures .Critical Care  Performed by: Deno Etienne, DO Authorized by: Deno Etienne, DO   Critical care provider statement:    Critical care time (minutes):  80   Critical care time was exclusive of:  Separately billable procedures  and treating other patients   Critical care was time spent personally by me on the following activities:  Development of treatment plan with patient or surrogate, discussions with consultants, evaluation of patient's response to treatment, examination of patient, ordering and review of laboratory studies, ordering and review of radiographic studies, ordering and performing treatments and interventions, pulse oximetry, re-evaluation of patient's condition and review of old charts   Care discussed with: admitting provider       Medications Ordered in ED Medications  nitroGLYCERIN 50 mg in dextrose 5 % 250  mL (0.2 mg/mL) infusion (30 mcg/min Intravenous Rate/Dose Change 01/12/23 2306)  enalaprilat (VASOTEC) injection 1.25 mg (has no administration in time range)  magnesium sulfate IVPB 2 g 50 mL (0 g Intravenous Stopped 01/12/23 2215)  albuterol (PROVENTIL) (2.5 MG/3ML) 0.083% nebulizer solution 10 mg (10 mg Nebulization Given 01/12/23 2255)    ED Course/ Medical Decision Making/ A&P                             Medical Decision Making Amount and/or Complexity of Data Reviewed Labs: ordered. Radiology: ordered.  Risk Prescription drug management. Decision regarding hospitalization.   72 yo M with a chief complaint of difficulty breathing.  This been going on the past week but acutely worse this evening.  Patient condition most consistent with acute pulmonary edema.  Looking back he has been admitted multiple times for the same, he is time it was hard to discern whether this was a COPD or acute pulmonary edema.  He does have some mild wheezes on exam.  He was given epinephrine and DuoNeb without improvement.  Will attempt to aggressively treat his blood pressure.  Continue on BiPAP.  Lab work chest x-ray reassess.  Chest x-ray with likely acute pulmonary edema on my independent interpretation.  No obvious focal infiltrate.  Patient is doing a bit better on nitroglycerin infusion and after  dose of enalapril.  His breathing is improved.  On repeat lung exam the patient still sounds very tight.  Will give continuous albuterol treatment.  Awaiting blood work.  No anemia, no leukocytosis.  AKI, trivial hyperK.   The patients results and plan were reviewed and discussed.   Any x-rays performed were independently reviewed by myself.   Differential diagnosis were considered with the presenting HPI.  Medications  nitroGLYCERIN 50 mg in dextrose 5 % 250 mL (0.2 mg/mL) infusion (30 mcg/min Intravenous Rate/Dose Change 01/12/23 2306)  enalaprilat (VASOTEC) injection 1.25 mg (has no administration in time range)  magnesium sulfate IVPB 2 g 50 mL (0 g Intravenous Stopped 01/12/23 2215)  albuterol (PROVENTIL) (2.5 MG/3ML) 0.083% nebulizer solution 10 mg (10 mg Nebulization Given 01/12/23 2255)    Vitals:   01/12/23 2230 01/12/23 2250 01/12/23 2255 01/12/23 2305  BP: (!) 138/95 (!) 147/102  (!) 131/96  Pulse: 88 91  87  Resp: (!) 22 (!) 28  (!) 32  SpO2: 100% 100% 100% 100%    Final diagnoses:  Acute pulmonary edema (HCC)    Admission/ observation were discussed with the admitting physician, patient and/or family and they are comfortable with the plan.          Final Clinical Impression(s) / ED Diagnoses Final diagnoses:  Acute pulmonary edema [facility])    Rx / DC Orders ED Discharge Orders     None         Deno Etienne, DO 01/12/23 2327

## 2023-01-13 ENCOUNTER — Other Ambulatory Visit: Payer: Self-pay

## 2023-01-13 DIAGNOSIS — I10 Essential (primary) hypertension: Secondary | ICD-10-CM

## 2023-01-13 DIAGNOSIS — N179 Acute kidney failure, unspecified: Secondary | ICD-10-CM

## 2023-01-13 DIAGNOSIS — J9601 Acute respiratory failure with hypoxia: Secondary | ICD-10-CM

## 2023-01-13 DIAGNOSIS — F17200 Nicotine dependence, unspecified, uncomplicated: Secondary | ICD-10-CM

## 2023-01-13 DIAGNOSIS — N1831 Chronic kidney disease, stage 3a: Secondary | ICD-10-CM

## 2023-01-13 DIAGNOSIS — J441 Chronic obstructive pulmonary disease with (acute) exacerbation: Secondary | ICD-10-CM

## 2023-01-13 DIAGNOSIS — I5042 Chronic combined systolic (congestive) and diastolic (congestive) heart failure: Secondary | ICD-10-CM

## 2023-01-13 DIAGNOSIS — Z91199 Patient's noncompliance with other medical treatment and regimen due to unspecified reason: Secondary | ICD-10-CM

## 2023-01-13 DIAGNOSIS — E1165 Type 2 diabetes mellitus with hyperglycemia: Secondary | ICD-10-CM

## 2023-01-13 DIAGNOSIS — J9602 Acute respiratory failure with hypercapnia: Secondary | ICD-10-CM

## 2023-01-13 LAB — CBC WITH DIFFERENTIAL/PLATELET
Abs Immature Granulocytes: 0 10*3/uL (ref 0.00–0.07)
Abs Immature Granulocytes: 0.04 10*3/uL (ref 0.00–0.07)
Basophils Absolute: 0.4 10*3/uL — ABNORMAL HIGH (ref 0.0–0.1)
Basophils Absolute: 0 10*3/uL (ref 0.0–0.1)
Basophils Relative: 4 %
Basophils Relative: 0 %
Eosinophils Absolute: 2 10*3/uL — ABNORMAL HIGH (ref 0.0–0.5)
Eosinophils Absolute: 0 10*3/uL (ref 0.0–0.5)
Eosinophils Relative: 21 %
Eosinophils Relative: 0 %
HCT: 47.5 % (ref 39.0–52.0)
HCT: 41.3 % (ref 39.0–52.0)
Hemoglobin: 15.4 g/dL (ref 13.0–17.0)
Hemoglobin: 13.6 g/dL (ref 13.0–17.0)
Immature Granulocytes: 1 %
Lymphocytes Relative: 32 %
Lymphocytes Relative: 8 %
Lymphs Abs: 3.1 10*3/uL (ref 0.7–4.0)
Lymphs Abs: 0.5 10*3/uL — ABNORMAL LOW (ref 0.7–4.0)
MCH: 29.8 pg (ref 26.0–34.0)
MCH: 30 pg (ref 26.0–34.0)
MCHC: 32.4 g/dL (ref 30.0–36.0)
MCHC: 32.9 g/dL (ref 30.0–36.0)
MCV: 91.9 fL (ref 80.0–100.0)
MCV: 91.2 fL (ref 80.0–100.0)
Monocytes Absolute: 1 10*3/uL (ref 0.1–1.0)
Monocytes Absolute: 0.1 10*3/uL (ref 0.1–1.0)
Monocytes Relative: 10 %
Monocytes Relative: 2 %
Neutro Abs: 3.2 10*3/uL (ref 1.7–7.7)
Neutro Abs: 5.4 10*3/uL (ref 1.7–7.7)
Neutrophils Relative %: 33 %
Neutrophils Relative %: 89 %
Platelets: 229 10*3/uL (ref 150–400)
Platelets: 189 10*3/uL (ref 150–400)
RBC: 5.17 MIL/uL (ref 4.22–5.81)
RBC: 4.53 MIL/uL (ref 4.22–5.81)
RDW: 13.9 % (ref 11.5–15.5)
RDW: 13.7 % (ref 11.5–15.5)
WBC: 9.6 10*3/uL (ref 4.0–10.5)
WBC: 6 10*3/uL (ref 4.0–10.5)
nRBC: 0 % (ref 0.0–0.2)
nRBC: 0 /100 WBC
nRBC: 0 % (ref 0.0–0.2)

## 2023-01-13 LAB — COMPREHENSIVE METABOLIC PANEL
ALT: 24 U/L (ref 0–44)
AST: 37 U/L (ref 15–41)
Albumin: 3.9 g/dL (ref 3.5–5.0)
Alkaline Phosphatase: 41 U/L (ref 38–126)
Anion gap: 16 — ABNORMAL HIGH (ref 5–15)
BUN: 31 mg/dL — ABNORMAL HIGH (ref 8–23)
CO2: 31 mmol/L (ref 22–32)
Calcium: 8.6 mg/dL — ABNORMAL LOW (ref 8.9–10.3)
Chloride: 87 mmol/L — ABNORMAL LOW (ref 98–111)
Creatinine, Ser: 2.95 mg/dL — ABNORMAL HIGH (ref 0.61–1.24)
GFR, Estimated: 22 mL/min — ABNORMAL LOW (ref >60)
Glucose, Bld: 162 mg/dL — ABNORMAL HIGH (ref 70–99)
Potassium: 3.9 mmol/L (ref 3.5–5.1)
Sodium: 134 mmol/L — ABNORMAL LOW (ref 135–145)
Total Bilirubin: 0.9 mg/dL (ref 0.3–1.2)
Total Protein: 6.9 g/dL (ref 6.5–8.1)

## 2023-01-13 LAB — BLOOD GAS, VENOUS
Acid-Base Excess: 8.4 mmol/L — ABNORMAL HIGH (ref 0.0–2.0)
Bicarbonate: 34.9 mmol/L — ABNORMAL HIGH (ref 20.0–28.0)
O2 Saturation: 94.2 %
Patient temperature: 37
pCO2, Ven: 55 mmHg (ref 44–60)
pH, Ven: 7.41 (ref 7.25–7.43)
pO2, Ven: 67 mmHg — ABNORMAL HIGH (ref 32–45)

## 2023-01-13 LAB — GLUCOSE, CAPILLARY
Glucose-Capillary: 149 mg/dL — ABNORMAL HIGH (ref 70–99)
Glucose-Capillary: 160 mg/dL — ABNORMAL HIGH (ref 70–99)
Glucose-Capillary: 178 mg/dL — ABNORMAL HIGH (ref 70–99)
Glucose-Capillary: 181 mg/dL — ABNORMAL HIGH (ref 70–99)
Glucose-Capillary: 137 mg/dL — ABNORMAL HIGH (ref 70–99)
Glucose-Capillary: 148 mg/dL — ABNORMAL HIGH (ref 70–99)
Glucose-Capillary: 194 mg/dL — ABNORMAL HIGH (ref 70–99)

## 2023-01-13 LAB — I-STAT VENOUS BLOOD GAS, ED
Acid-Base Excess: 8 mmol/L — ABNORMAL HIGH (ref 0.0–2.0)
Bicarbonate: 37.4 mmol/L — ABNORMAL HIGH (ref 20.0–28.0)
Calcium, Ion: 0.99 mmol/L — ABNORMAL LOW (ref 1.15–1.40)
HCT: 49 % (ref 39.0–52.0)
Hemoglobin: 16.7 g/dL (ref 13.0–17.0)
O2 Saturation: 99 %
Potassium: 5.3 mmol/L — ABNORMAL HIGH (ref 3.5–5.1)
Sodium: 135 mmol/L (ref 135–145)
TCO2: 39 mmol/L — ABNORMAL HIGH (ref 22–32)
pCO2, Ven: 68.1 mmHg — ABNORMAL HIGH (ref 44–60)
pH, Ven: 7.348 (ref 7.25–7.43)
pO2, Ven: 146 mmHg — ABNORMAL HIGH (ref 32–45)

## 2023-01-13 LAB — PHOSPHORUS: Phosphorus: 5.7 mg/dL — ABNORMAL HIGH (ref 2.5–4.6)

## 2023-01-13 LAB — MAGNESIUM: Magnesium: 2.8 mg/dL — ABNORMAL HIGH (ref 1.7–2.4)

## 2023-01-13 MED ORDER — FUROSEMIDE 40 MG PO TABS
40.0000 mg | ORAL_TABLET | Freq: Two times a day (BID) | ORAL | Status: AC
  Administered 2023-01-13: 40 mg via ORAL
  Filled 2023-01-13: qty 1

## 2023-01-13 MED ORDER — SPIRONOLACTONE 12.5 MG HALF TABLET: 12.5000 mg | ORAL_TABLET | Freq: Every day | ORAL | Status: DC

## 2023-01-13 MED ORDER — GUAIFENESIN-DM 100-10 MG/5ML PO SYRP
5.0000 mL | ORAL_SOLUTION | ORAL | Status: DC | PRN
  Administered 2023-01-13 – 2023-01-15 (×8): 5 mL via ORAL
  Filled 2023-01-13 (×8): qty 5

## 2023-01-13 MED ORDER — MONTELUKAST SODIUM 10 MG PO TABS
10.0000 mg | ORAL_TABLET | Freq: Every day | ORAL | Status: DC
  Administered 2023-01-13 – 2023-01-15 (×3): 10 mg via ORAL
  Filled 2023-01-13 (×3): qty 1

## 2023-01-13 MED ORDER — BISOPROLOL FUMARATE 5 MG PO TABS
5.0000 mg | ORAL_TABLET | Freq: Every day | ORAL | Status: DC
  Administered 2023-01-13 – 2023-01-16 (×4): 5 mg via ORAL
  Filled 2023-01-13 (×4): qty 1

## 2023-01-13 MED ORDER — ASPIRIN 81 MG PO TBEC
81.0000 mg | DELAYED_RELEASE_TABLET | Freq: Every day | ORAL | Status: DC
  Administered 2023-01-13 – 2023-01-16 (×4): 81 mg via ORAL
  Filled 2023-01-13 (×4): qty 1

## 2023-01-13 MED ORDER — ENOXAPARIN SODIUM 30 MG/0.3ML IJ SOSY
30.0000 mg | PREFILLED_SYRINGE | Freq: Every day | INTRAMUSCULAR | Status: DC
  Administered 2023-01-13 – 2023-01-16 (×4): 30 mg via SUBCUTANEOUS
  Filled 2023-01-13 (×4): qty 0.3

## 2023-01-13 MED ORDER — IPRATROPIUM-ALBUTEROL 0.5-2.5 (3) MG/3ML IN SOLN
3.0000 mL | Freq: Four times a day (QID) | RESPIRATORY_TRACT | Status: DC
  Administered 2023-01-13 (×3): 3 mL via RESPIRATORY_TRACT
  Filled 2023-01-13 (×2): qty 3

## 2023-01-13 MED ORDER — ISOSORB DINITRATE-HYDRALAZINE 20-37.5 MG PO TABS
1.0000 | ORAL_TABLET | Freq: Three times a day (TID) | ORAL | Status: DC
  Administered 2023-01-13 – 2023-01-16 (×10): 1 via ORAL
  Filled 2023-01-13 (×10): qty 1

## 2023-01-13 MED ORDER — UMECLIDINIUM BROMIDE 62.5 MCG/ACT IN AEPB
1.0000 | INHALATION_SPRAY | Freq: Every day | RESPIRATORY_TRACT | Status: DC
  Administered 2023-01-13 – 2023-01-14 (×2): 1 via RESPIRATORY_TRACT
  Filled 2023-01-13: qty 7

## 2023-01-13 MED ORDER — MOMETASONE FURO-FORMOTEROL FUM 200-5 MCG/ACT IN AERO
2.0000 | INHALATION_SPRAY | Freq: Two times a day (BID) | RESPIRATORY_TRACT | Status: DC
  Administered 2023-01-13 – 2023-01-14 (×2): 2 via RESPIRATORY_TRACT
  Filled 2023-01-13: qty 8.8

## 2023-01-13 MED ORDER — INSULIN ASPART 100 UNIT/ML IJ SOLN
0.0000 [IU] | INTRAMUSCULAR | Status: DC
  Administered 2023-01-13: 1 [IU] via SUBCUTANEOUS
  Administered 2023-01-13 (×2): 2 [IU] via SUBCUTANEOUS
  Administered 2023-01-13: 1 [IU] via SUBCUTANEOUS
  Administered 2023-01-13: 2 [IU] via SUBCUTANEOUS
  Administered 2023-01-14: 1 [IU] via SUBCUTANEOUS
  Administered 2023-01-14: 2 [IU] via SUBCUTANEOUS
  Administered 2023-01-14: 1 [IU] via SUBCUTANEOUS
  Administered 2023-01-14: 5 [IU] via SUBCUTANEOUS
  Administered 2023-01-14: 1 [IU] via SUBCUTANEOUS
  Administered 2023-01-14: 3 [IU] via SUBCUTANEOUS
  Administered 2023-01-14 – 2023-01-15 (×2): 2 [IU] via SUBCUTANEOUS
  Administered 2023-01-15: 3 [IU] via SUBCUTANEOUS
  Administered 2023-01-15: 1 [IU] via SUBCUTANEOUS
  Administered 2023-01-15: 2 [IU] via SUBCUTANEOUS
  Administered 2023-01-15: 3 [IU] via SUBCUTANEOUS
  Administered 2023-01-16: 1 [IU] via SUBCUTANEOUS

## 2023-01-13 MED ORDER — FUROSEMIDE 40 MG PO TABS: 40.0000 mg | ORAL_TABLET | Freq: Two times a day (BID) | ORAL | Status: DC

## 2023-01-13 MED ORDER — GUAIFENESIN ER 600 MG PO TB12
600.0000 mg | ORAL_TABLET | Freq: Two times a day (BID) | ORAL | Status: DC
  Administered 2023-01-13 – 2023-01-16 (×7): 600 mg via ORAL
  Filled 2023-01-13 (×7): qty 1

## 2023-01-13 MED ORDER — IPRATROPIUM-ALBUTEROL 0.5-2.5 (3) MG/3ML IN SOLN: 3.0000 mL | RESPIRATORY_TRACT | Status: DC | PRN

## 2023-01-13 MED ORDER — ATORVASTATIN CALCIUM 40 MG PO TABS
80.0000 mg | ORAL_TABLET | Freq: Every evening | ORAL | Status: DC
  Administered 2023-01-13: 80 mg via ORAL
  Filled 2023-01-13: qty 2

## 2023-01-13 MED ORDER — METHYLPREDNISOLONE SODIUM SUCC 125 MG IJ SOLR
80.0000 mg | Freq: Every day | INTRAMUSCULAR | Status: AC
  Administered 2023-01-13 – 2023-01-14 (×2): 80 mg via INTRAVENOUS
  Filled 2023-01-13 (×2): qty 2

## 2023-01-13 NOTE — ED Notes (Signed)
Attempted to call report on pt. Was informed charge nurse reviewing chart and nurse will call back for report when ready.

## 2023-01-13 NOTE — H&P (Addendum)
History and Physical  Marcus Tran [id number] DOB: [birth year] DOA: 01/12/2023  Referring physician: Dr. Tyrone Tran, [place]. PCP: Marcus Mile, NP  Outpatient Specialists: Cardiology, pulmonology. Patient coming from: Home.  Chief Complaint: Shortness of breath.  HPI: Marcus Tran is a 72 y.o. 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  EMS was activated.  Upon EMS arrival the patient was found to have O2 saturation in the 70s on room air.  He was placed on NRB.  In the ED he did not tolerate being on [place] and was subsequently placed on BiPAP.  No significant peripheral edema. BNP is elevated > 180 and mild pulmonary edema on CXR.  The patient received IV Solu-Medrol, he was giving nebs by EMS en route.  TRH, hospitalist service, was asked to admit.  At the time of the visit he is on BiPAP.  Endorses intermittent dry cough.  Attempt was made to take him off the BIPAP in the ED but he did not tolerate, and was placed back on it.  ED Course: Tmax 97.6.  BP 107/82, pulse 88, respiratory rate 32, O2 saturation 100% on 3 L.  Lab studies remarkable for hyperkalemia, worsening kidney function, BUN 29, creatinine 2.91 with GFR of 22.  AST 58, total bilirubin 2.0.  BNP 184.  High-sensitivity troponin 22.  CBC with eosinophilia and basophilia.  Review of Systems: Review of systems as noted in the HPI. All other systems reviewed and are negative.   Past Medical History:  Diagnosis Date   Acute metabolic encephalopathy 99991111   Acute respiratory failure with hypoxia ([place]) 03/20/2022   Acute respiratory failure with hypoxia and hypercapnia (HCC) 03/06/2022   CHF (congestive heart failure) ([place])    Dyslipidemia    Hypertension    Past  Surgical History:  Procedure Laterality Date   LEG SURGERY     Right-sided as result of trauma   RIGHT/LEFT HEART CATH AND CORONARY ANGIOGRAPHY N/A 03/09/2022   Procedure: RIGHT/LEFT HEART CATH AND CORONARY ANGIOGRAPHY;  Surgeon: Marcus Harp, MD;  Location: [facility];  Service: Cardiovascular;  Laterality: N/A;   Umbilicus surgery     As a child    Social History:  reports that he has been smoking cigarettes. He has a 25.00 pack-year smoking history. He has never used smokeless tobacco. He reports current drug use. Drug: Marijuana. No history on file for alcohol use.   No Known Allergies  Family History  Problem Relation Age of Onset   Hypertension Mother       Prior to Admission medications   Medication Sig Start Date End Date Taking? Authorizing Provider  albuterol (VENTOLIN HFA) 108 (90 Base) MCG/ACT inhaler Inhale 2 puffs into the lungs every 4 (four) hours as needed for wheezing or shortness of breath. 04/06/22   Tran, Marcus Gains, MD  aspirin EC 81 MG tablet Take 1 tablet (81 mg total) by mouth daily. Swallow whole. 03/24/22   Marcus Dubonnet, NP  atorvastatin (Marcus Tran) 80 MG tablet Take 1 tablet (80 mg total) by mouth every evening. 03/24/22   Marcus Dubonnet, NP  bisoprolol (ZEBETA) 5 MG tablet TAKE 1 Tablet BY MOUTH ONCE DAILY 08/28/22   Marcus Breeding, MD  Budeson-Glycopyrrol-Formoterol (BREZTRI AEROSPHERE) 160-9-4.8 MCG/ACT AERO Inhale 2 puffs into the lungs  as needed. 05/21/22   Tran, Marcus Gains, MD  FARXIGA 10 MG TABS tablet TAKE 1 Tablet BY MOUTH ONCE DAILY 08/26/22   Marcus Dubonnet, NP  furosemide (LASIX) 40 MG tablet TAKE 1 Tablet BY MOUTH TWICE DAILY 09/25/22   Marcus Breeding, MD  guaiFENesin-dextromethorphan (ROBITUSSIN DM) 100-10 MG/5ML syrup Take 5 mLs by mouth every 4 (four) hours as needed for cough. 11/12/22   Marcus Blamer, DO  ipratropium-albuterol (DUONEB) 0.5-2.5 (3) MG/3ML SOLN Inhale 3 mLs into the lungs in the morning, at noon, in the  evening, and at bedtime. 05/21/22   Tran, Marcus Gains, MD  isosorbide-hydrALAZINE (Marcus Tran) 20-37.5 MG tablet Take 1 tablet by mouth 3 (three) times daily. 03/24/22   Marcus Dubonnet, NP  metFORMIN (GLUCOPHAGE) 500 MG tablet Take 1 tablet (500 mg total) by mouth 2 (two) times daily with a meal. 05/07/22   Marcus Lose, MD  montelukast (SINGULAIR) 10 MG tablet Take 10 mg by mouth at bedtime.    [provider]  predniSONE (DELTASONE) 20 MG tablet Take 2 tablets (40 mg total) by mouth daily with breakfast. 11/13/22   Marcus Blamer, DO  sacubitril-valsartan (ENTRESTO) 24-26 MG Take 1 tablet by mouth 2 (two) times daily. 12/18/22   Marcus Breeding, MD  spironolactone (ALDACTONE) 25 MG tablet Take 12.5 mg by mouth daily.    [provider]    Physical Exam: BP 107/82 (BP Location: Left Arm)   Pulse 88   Temp (!) 97.4 F (36.3 C) (Oral)   Resp (!) 32   SpO2 100%   General: 72 y.o. year-old male well developed well nourished in no acute distress.  Alert and oriented x3. Cardiovascular: Regular rate and rhythm with no rubs or gallops.  No thyromegaly or JVD noted.  Trace lower extremity edema. 2/4 pulses in all 4 extremities. Respiratory: Diffuse faint rales bilaterally.  Wheezing noted.  Poor inspiratory effort. Abdomen: Soft nontender nondistended with normal bowel sounds x4 quadrants. Muskuloskeletal: No cyanosis or clubbing noted bilaterally Neuro: CN II-XII intact, strength, sensation, reflexes Skin: No ulcerative lesions noted or rashes Psychiatry: Judgement and insight appear normal. Mood is appropriate for condition and setting          Labs on Admission:  Basic Metabolic Panel: Recent Labs  Lab 01/12/23 2130 01/13/23 0001  NA 136 135  K 5.5* 5.3*  CL 91*  --   CO2 29  --   GLUCOSE 107*  --   BUN 29*  --   CREATININE 2.91*  --   CALCIUM 8.9  --    Liver Function Tests: Recent Labs  Lab 01/12/23 2130  AST 58*  ALT 27  ALKPHOS 43  BILITOT 2.0*   PROT 7.5  ALBUMIN 4.4   No results for input(s): "LIPASE", "AMYLASE" in the last 168 hours. No results for input(s): "AMMONIA" in the last 168 hours. CBC: Recent Labs  Lab 01/12/23 2130 01/13/23 0001  WBC 9.6  --   NEUTROABS 3.2  --   HGB 15.4 16.7  HCT 47.5 49.0  MCV 91.9  --   PLT 229  --    Cardiac Enzymes: No results for input(s): "CKTOTAL", "CKMB", "CKMBINDEX", "TROPONINI" in the last 168 hours.  BNP (last 3 results) Recent Labs    10/16/22 1741 11/10/22 2321 01/12/23 2130  BNP 171.6* 148.7* 184.7*    ProBNP (last 3 results) No results for input(s): "PROBNP" in the last 8760 hours.  CBG: No results for input(s): "GLUCAP" in the last 168  hours.  Radiological Exams on Admission: DG Chest Port 1 View  Result Date: 01/12/2023 CLINICAL DATA:  Shortness of breath EXAM: PORTABLE CHEST 1 VIEW COMPARISON:  11/10/2022 FINDINGS: No acute airspace disease. Enlarged central pulmonary vessels. Normal cardiac size. No pneumothorax IMPRESSION: No active disease. Enlarged central pulmonary vessels suggesting pulmonary arterial hypertension. Electronically Signed   By: Donavan Foil M.D.   On: 01/12/2023 22:00    EKG: I independently viewed the EKG done and my findings are as followed: Sinus tachycardia rate of 102.  Nonspecific ST-T changes.  QTc 510.  Assessment/Plan Present on Admission:  Acute respiratory failure with hypoxia (HCC)  Active Problems:   Acute respiratory failure with hypoxia (HCC)  Acute hypoxic respiratory failure secondary to suspected mild pulmonary edema versus asthma/COPD exacerbation. Initial O2 saturation in the 70s placed on nonrebreather by EMS Not on oxygen supplementation at baseline Was placed on BiPAP in the ED due to worsening clinical picture Wean off BiPAP as tolerated Maintain O2 saturation greater than 90% Incentive spirometer when no longer on BiPAP Early mobilization  Suspected, acute asthma/COPD exacerbation,  POA Bronchodilators IV Solu-Medrol DuoNebs Incentive spirometer Mobilization  Prolonged QTc QTc on admission twelve-lead EKG 510 Avoid QTc prolonging agents Optimize magnesium and potassium levels Closely monitor on telemetry  AKI on CKD 3 A Hyperkalemia Avoid nephrotoxic agents, dehydration and hypotension. Not significantly volume overload on exam Resume home p.o. Lasix x 1 when no longer n.p.o. and closely monitor renal function and electrolytes. Home spironolactone held due to hyperkalemia. Closely monitor urine output with strict I's and O's Repeat renal panel  Combined systolic and diastolic CHF Last 2D echo done on 06/18/2022 revealed LVEF 40 to 45% and grade 1 diastolic dysfunction No significant peripheral edema on exam Resume home goal-directed medical therapy as able. Avoid hypotension Start strict I's and O's and daily weight  Type 2 diabetes with hyperglycemia Obtain hemoglobin A1c Start insulin sliding scale every 4 hours while NPO  History of THC use Obtain UDS     DVT prophylaxis: Subcu Lovenox daily  Code Status: Full code  Family Communication: None at bedside.  Disposition Plan: Admitted to progressive care unit.  Consults called: None.  Admission status: Inpatient status.   Status is: Inpatient The patient requires at least 2 midnights for further evaluation and treatment of present condition.   Kayleen Memos MD [facility] Pager [phone]  If 7PM-7AM, please contact night-coverage [email or web address] Password [email or web address]  01/13/2023, 1:23 AM

## 2023-01-13 NOTE — Progress Notes (Signed)
PROGRESS NOTE  Marcus Tran [id number] DOB: 1951/06/25   PCP: Cipriano Mile, NP  Patient is from: Home.  Lives with daughter.  DOA: 01/12/2023 LOS: 1  Chief complaints Chief Complaint  Patient presents with   Shortness of Breath     Brief Narrative / Interim history: 72 year old M with PMH of COPD, OSA, systolic CHF, CAD, syncope, CKD-3A, DM-2, HTN, HLD and tobacco use disorder presenting with shortness of breath and generalized weakness and admitted for acute respiratory failure with hypoxia in the setting of COPD exacerbation and CHF exacerbation, and AKI.  Patient reports smoking 3 to 4 cigarettes a day.  Seems noncompliant with his Breztri.  Reportedly hypoxic to 70% on RA.  VBG with hypercapnia.  Initially put on NRB and transition to BiPAP in ED.  He was tachypneic to upper 20s.  184.  Troponin 22. Cr 2.95.  BUN 31.  pCO2 67.  CBC without significant finding.  CXR suggested.  Patient was started on Solu-Medrol, scheduled and as needed nebulizers and admitted.  The next day, respiratory status improved.  Weaned off BiPAP to nasal cannula.    Subjective: Seen and examined earlier this morning.  No major events overnight of this morning.  Reports improvement in his breathing.  Complains of "phlegm in my throat".  Denies chest pain.  Reports cough.  Currently saturating in mid 90s on 2 L.  Objective: Vitals:   01/13/23 0722 01/13/23 0745 01/13/23 1131 01/13/23 1554  BP: 122/85  (!) 153/94 119/81  Pulse: 83  (!) 101 97  Resp: (!) 21  19 20   Temp: 97.6 F (36.4 C)  97.8 F (36.6 C) 97.8 F (36.6 C)  TempSrc: Axillary  Oral Oral  SpO2: 100% 99% 97% 99%  Weight:      Height:        Examination:  GENERAL: No apparent distress.  Nontoxic. HEENT: MMM.  Vision and hearing grossly intact.  NECK: Supple.  No apparent JVD.  RESP:  No IWOB.  Fair aeration bilaterally. CVS:  RRR. Heart sounds normal.  ABD/GI/GU: BS+. Abd soft, NTND.  MSK/EXT:  Moves extremities. No apparent  deformity. No edema.  SKIN: no apparent skin lesion or wound NEURO: Awake, alert and oriented appropriately.  No apparent focal neuro deficit. PSYCH: Calm. Normal affect.   Procedures:  None  Microbiology summarized: None  Assessment and plan: Principal Problem:   Acute respiratory failure with hypoxia and hypercapnia (HCC) Active Problems:   Tobacco use disorder   Chronic HFrEF (heart failure with reduced ejection fraction) (HCC)   HTN (hypertension)   Stage 3a chronic kidney disease (CKD) (HCC)   DM2 (diabetes mellitus, type 2) (HCC)   Chronic combined systolic and diastolic CHF (congestive heart failure) (HCC)   COPD with acute exacerbation (HCC)   Noncompliance  Acute respiratory failure with hypoxia and hypercapnia: Likely due to COPD exacerbation.  Desaturated to 70% on RA when EMS arrived and was started on NRB.  VBG with hypercapnia.  He was on BiPAP overnight.  Weaned to 2 L by nasal cannula this morning.  Improving.   -Wean off oxygen as able.  Minimum oxygen to keep saturation above 90% -Continue Solu-Medrol -Start LABA/LAMA/ICS  -Continue DuoNebs as needed  -Mucolytic's and antitussive as needed -Discussed the importance of smoking cessation and compliance with his inhalers.   -Incentive telemetry/OOB/PT/OT  COPD exacerbation: Due to ongoing cigarette smoking and noncompliance with inhalers. -Management as above  AKI on CKD-3A: Cr 2.91 (baseline 2.95).  Appears euvolemic on  exam -Avoid nephrotoxic meds -Continue monitoring -Further workup if no improvement  Chronic combined CHF: TTE in 05/2022 with LVEF of 40 to 45% and G1 DD.  Appears euvolemic on exam.  Has AKI.  Respiratory status improved with COPD management. -Continue holding diuretics -Strict intake and output, daily weight, renal functions and electrolytes  Prolonged QTc: QTc 510. -Avoid QT prolonging drugs -Optimize Mg and K  Controlled NIDDM-2 with hyperglycemia and CKD-3: A1c 6.8% in  10/2022. Recent Labs  Lab 01/13/23 0220 01/13/23 0455 01/13/23 0721 01/13/23 1135 01/13/23 1553  GLUCAP 149* 160* 178* 181* 137*  -Continue current insulin regimen  Essential hypertension: Normotensive. -Continue home BiDil and bisoprolol -Continue holding diuretics  Generalized weakness/physical deconditioning -PT/OT  Noncompliance -Counseled on the importance of compliance with his meds.  Hyperkalemia: Resolved.  Body mass index is 24.95 kg/m.          DVT prophylaxis:  enoxaparin (LOVENOX) injection 30 mg Start: 01/13/23 1000  Code Status: Full code Family Communication: None at bedside Level of care: Progressive Status is: Inpatient Remains inpatient appropriate because: Respiratory failure/COPD exacerbation/AKI   Final disposition: Home Consultants:  None  55 minutes with more than 50% spent in reviewing records, counseling patient/family and coordinating care.   Sch Meds:  Scheduled Meds:  aspirin EC  81 mg Oral Daily   atorvastatin  80 mg Oral QPM   bisoprolol  5 mg Oral Daily   enoxaparin (LOVENOX) injection  30 mg Subcutaneous Daily   guaiFENesin  600 mg Oral BID   insulin aspart  0-9 Units Subcutaneous Q4H   isosorbide-hydrALAZINE  1 tablet Oral TID   methylPREDNISolone (SOLU-MEDROL) injection  80 mg Intravenous Daily   mometasone-formoterol  2 puff Inhalation BID   montelukast  10 mg Oral QHS   umeclidinium bromide  1 puff Inhalation Daily   Continuous Infusions: PRN Meds:.guaiFENesin-dextromethorphan, ipratropium-albuterol  Antimicrobials: Anti-infectives (From admission, onward)    None        I have personally reviewed the following labs and images: CBC: Recent Labs  Lab 01/12/23 2130 01/13/23 0001 01/13/23 0535  WBC 9.6  --  6.0  NEUTROABS 3.2  --  5.4  HGB 15.4 16.7 13.6  HCT 47.5 49.0 41.3  MCV 91.9  --  91.2  PLT 229  --  189   BMP &GFR Recent Labs  Lab 01/12/23 2130 01/13/23 0001 01/13/23 0535  NA 136 135  134*  K 5.5* 5.3* 3.9  CL 91*  --  87*  CO2 29  --  31  GLUCOSE 107*  --  162*  BUN 29*  --  31*  CREATININE 2.91*  --  2.95*  CALCIUM 8.9  --  8.6*  MG  --   --  2.8*  PHOS  --   --  5.7*   Estimated Creatinine Clearance: 19.7 mL/min (A) (by C-G formula based on SCr of 2.95 mg/dL (H)). Liver & Pancreas: Recent Labs  Lab 01/12/23 2130 01/13/23 0535  AST 58* 37  ALT 27 24  ALKPHOS 43 41  BILITOT 2.0* 0.9  PROT 7.5 6.9  ALBUMIN 4.4 3.9   No results for input(s): "LIPASE", "AMYLASE" in the last 168 hours. No results for input(s): "AMMONIA" in the last 168 hours. Diabetic: No results for input(s): "HGBA1C" in the last 72 hours. Recent Labs  Lab 01/13/23 0220 01/13/23 0455 01/13/23 0721 01/13/23 1135 01/13/23 1553  GLUCAP 149* 160* 178* 181* 137*   Cardiac Enzymes: No results for input(s): "CKTOTAL", "CKMB", "CKMBINDEX", "TROPONINI"  in the last 168 hours. No results for input(s): "PROBNP" in the last 8760 hours. Coagulation Profile: No results for input(s): "INR", "PROTIME" in the last 168 hours. Thyroid Function Tests: No results for input(s): "TSH", "T4TOTAL", "FREET4", "T3FREE", "THYROIDAB" in the last 72 hours. Lipid Profile: No results for input(s): "CHOL", "HDL", "LDLCALC", "TRIG", "CHOLHDL", "LDLDIRECT" in the last 72 hours. Anemia Panel: No results for input(s): "VITAMINB12", "FOLATE", "FERRITIN", "TIBC", "IRON", "RETICCTPCT" in the last 72 hours. Urine analysis:    Component Value Date/Time   COLORURINE STRAW (A) 03/21/2022 1545   APPEARANCEUR CLEAR 03/21/2022 1545   LABSPEC 1.005 03/21/2022 1545   PHURINE 5.0 03/21/2022 1545   GLUCOSEU >=500 (A) 03/21/2022 1545   HGBUR NEGATIVE 03/21/2022 1545   BILIRUBINUR NEGATIVE 03/21/2022 1545   KETONESUR NEGATIVE 03/21/2022 1545   PROTEINUR NEGATIVE 03/21/2022 1545   NITRITE NEGATIVE 03/21/2022 1545   LEUKOCYTESUR NEGATIVE 03/21/2022 1545   Sepsis Labs: Invalid input(s): "PROCALCITONIN",  "LACTICIDVEN"  Microbiology: No results found for this or any previous visit (from the past 240 hour(s)).  Radiology Studies: DG Chest Port 1 View  Result Date: 01/12/2023 CLINICAL DATA:  Shortness of breath EXAM: PORTABLE CHEST 1 VIEW COMPARISON:  11/10/2022 FINDINGS: No acute airspace disease. Enlarged central pulmonary vessels. Normal cardiac size. No pneumothorax IMPRESSION: No active disease. Enlarged central pulmonary vessels suggesting pulmonary arterial hypertension. Electronically Signed   By: Donavan Foil M.D.   On: 01/12/2023 22:00      Kalib Bhagat T. [place]  If 7PM-7AM, please contact night-coverage [email or web address] 01/13/2023, 4:19 PM

## 2023-01-13 NOTE — ED Notes (Signed)
ED TO INPATIENT HANDOFF REPORT  ED Nurse Name and Phone #: Rip Harbour RN [phone]  S Name/Age/Gender Marcus Tran 72 y.o. male Room/Bed: 034C/034C  Code Status   Code Status: Prior  Home/SNF/Other Home Patient oriented to: self, place, time, and situation Is this baseline? Yes   Triage Complete: Triage complete  Chief Complaint Acute respiratory failure with hypoxia ([place]) [J96.01]  Triage Note PT BIB EMS from home with c/o SOB since this morning. On fire's arrival, pt was 76% on room air. Pt was placed on 4L O2 via [place]. Wheezing on auscultation so pt was given duoneb and 125 mg IV solumedrol. Lung sounds worse after duoneb and pt with increased work of breathing so pt was started on CPAP. 0.3 mg IV epi also given en route. Hx of CHF, COPD, and asthma. Pt used home nebs and albuterol inhaler prior to EMS arrival.   Allergies No Known Allergies  Level of Care/Admitting Diagnosis ED Disposition     ED Disposition  Admit   Condition  --   [place]: [place] [[id number]]  Level of Care: Progressive [102]  Admit to Progressive based on following criteria: MULTISYSTEM THREATS such as stable sepsis, metabolic/electrolyte imbalance with or without encephalopathy that is responding to early treatment.  May admit patient to [facility] or [facility] if equivalent level of care is available:: No  Covid Evaluation: Asymptomatic - no recent exposure (last 10 days) testing not required  Diagnosis: Acute respiratory failure with hypoxia [facility]) [id number]  Admitting Physician: Kayleen Memos [id number]  Attending Physician: Kayleen Memos [id number]  Certification:: I certify this patient will need inpatient services for at least 2 midnights  Estimated Length of Stay: 2          B Medical/Surgery History Past Medical History:  Diagnosis Date   Acute metabolic encephalopathy [id number]   Acute respiratory failure with hypoxia ([place]) 03/20/2022   Acute  respiratory failure with hypoxia and hypercapnia ([place]) 03/06/2022   CHF (congestive heart failure) ([place])    Dyslipidemia    Hypertension    Past Surgical History:  Procedure Laterality Date   LEG SURGERY     Right-sided as result of trauma   RIGHT/LEFT HEART CATH AND CORONARY ANGIOGRAPHY N/A 03/09/2022   Procedure: RIGHT/LEFT HEART CATH AND CORONARY ANGIOGRAPHY;  Surgeon: Lorretta Harp, MD;  Location: [facility];  Service: Cardiovascular;  Laterality: N/A;   Umbilicus surgery     As a child     A IV Location/Drains/Wounds Patient Lines/Drains/Airways Status     Active Line/Drains/Airways     Name Placement date Placement time Site Days   Peripheral IV 01/12/23 20 G Right Hand 01/12/23  --  Hand  1            Intake/Output Last 24 hours  Intake/Output Summary (Last 24 hours) at 01/13/2023 0024 Last data filed at 01/13/2023 0004 Gross per 24 hour  Intake 55.25 ml  Output --  Net 55.25 ml    Labs/Imaging Results for orders placed or performed during the hospital encounter of 01/12/23 (from the past 48 hour(s))  CBC with Differential     Status: Abnormal   Collection Time: 01/12/23  9:30 PM  Result Value Ref Range   WBC 9.6 4.0 - 10.5 K/uL   RBC 5.17 4.22 - 5.81 MIL/uL   Hemoglobin 15.4 13.0 - 17.0 g/dL   HCT 47.5 39.0 - 52.0 %   MCV 91.9 80.0 - 100.0 fL  MCH 29.8 26.0 - 34.0 pg   MCHC 32.4 30.0 - 36.0 g/dL   RDW 13.9 11.5 - 15.5 %   Platelets 229 150 - 400 K/uL   nRBC 0.0 0.0 - 0.2 %   Neutrophils Relative % 33 %   Neutro Abs 3.2 1.7 - 7.7 K/uL   Lymphocytes Relative 32 %   Lymphs Abs 3.1 0.7 - 4.0 K/uL   Monocytes Relative 10 %   Monocytes Absolute 1.0 0.1 - 1.0 K/uL   Eosinophils Relative 21 %   Eosinophils Absolute 2.0 (H) 0.0 - 0.5 K/uL   Basophils Relative 4 %   Basophils Absolute 0.4 (H) 0.0 - 0.1 K/uL   nRBC 0 0 /100 WBC   Abs Immature Granulocytes 0.00 0.00 - 0.07 K/uL   Polychromasia PRESENT     Comment: Performed at [facility] [address], [place], [place] [address]  Comprehensive metabolic panel     Status: Abnormal   Collection Time: 01/12/23  9:30 PM  Result Value Ref Range   Sodium 136 135 - 145 mmol/L   Potassium 5.5 (H) 3.5 - 5.1 mmol/L    Comment: HEMOLYSIS AT THIS LEVEL MAY AFFECT RESULT   Chloride 91 (L) 98 - 111 mmol/L   CO2 29 22 - 32 mmol/L   Glucose, Bld 107 (H) 70 - 99 mg/dL    Comment: Glucose reference range applies only to samples taken after fasting for at least 8 hours.   BUN 29 (H) 8 - 23 mg/dL   Creatinine, Ser 2.91 (H) 0.61 - 1.24 mg/dL   Calcium 8.9 8.9 - 10.3 mg/dL   Total Protein 7.5 6.5 - 8.1 g/dL   Albumin 4.4 3.5 - 5.0 g/dL   AST 58 (H) 15 - 41 U/L    Comment: HEMOLYSIS AT THIS LEVEL MAY AFFECT RESULT   ALT 27 0 - 44 U/L    Comment: HEMOLYSIS AT THIS LEVEL MAY AFFECT RESULT   Alkaline Phosphatase 43 38 - 126 U/L    Comment: HEMOLYSIS AT THIS LEVEL MAY AFFECT RESULT   Total Bilirubin 2.0 (H) 0.3 - 1.2 mg/dL    Comment: HEMOLYSIS AT THIS LEVEL MAY AFFECT RESULT   GFR, Estimated 22 (L) >60 mL/min    Comment: (NOTE) Calculated using the CKD-EPI Creatinine Equation (2021)    Anion gap 16 (H) 5 - 15    Comment: Performed at [facility], [place] [address], [place], [place] [address]  Troponin I (High Sensitivity)     Status: Abnormal   Collection Time: 01/12/23  9:30 PM  Result Value Ref Range   Troponin I (High Sensitivity) 22 (H) <18 ng/L    Comment: (NOTE) Elevated high sensitivity troponin I (hsTnI) values and significant  changes across serial measurements may suggest ACS but many other  chronic and acute conditions are known to elevate hsTnI results.  Refer to the "Links" section for chest pain algorithms and additional  guidance. Performed at [facility], [place] [address], [place], [place] [address]   Brain natriuretic peptide     Status: Abnormal   Collection Time: 01/12/23  9:30 PM  Result Value Ref Range   B Natriuretic Peptide 184.7 (H) 0.0  - 100.0 pg/mL    Comment: Performed at [facility] [address] [place],  [address]  I-Stat venous blood gas, ED     Status: Abnormal   Collection Time: 01/13/23 12:01 AM  Result Value Ref Range  pH, Ven 7.348 7.25 - 7.43   pCO2, Ven 68.1 (H) 44 - 60 mmHg   pO2, Ven 146 (H) 32 - 45 mmHg   Bicarbonate 37.4 (H) 20.0 - 28.0 mmol/L   TCO2 39 (H) 22 - 32 mmol/L   O2 Saturation 99 %   Acid-Base Excess 8.0 (H) 0.0 - 2.0 mmol/L   Sodium 135 135 - 145 mmol/L   Potassium 5.3 (H) 3.5 - 5.1 mmol/L   Calcium, Ion 0.99 (L) 1.15 - 1.40 mmol/L   HCT 49.0 39.0 - 52.0 %   Hemoglobin 16.7 13.0 - 17.0 g/dL   Sample type VENOUS    Comment NOTIFIED PHYSICIAN    DG Chest Port 1 View  Result Date: 01/12/2023 CLINICAL DATA:  Shortness of breath EXAM: PORTABLE CHEST 1 VIEW COMPARISON:  11/10/2022 FINDINGS: No acute airspace disease. Enlarged central pulmonary vessels. Normal cardiac size. No pneumothorax IMPRESSION: No active disease. Enlarged central pulmonary vessels suggesting pulmonary arterial hypertension. Electronically Signed   By: Donavan Foil M.D.   On: 01/12/2023 22:00    Pending Labs Unresulted Labs (From admission, onward)     Start     Ordered   01/12/23 2337  Blood gas, venous  ONCE - STAT,   STAT        01/12/23 2336            Vitals/Pain Today's Vitals   01/12/23 2315 01/12/23 2330 01/13/23 0000 01/13/23 0015  BP: (!) 134/99 (!) 116/93 (!) 131/101 (!) 127/98  Pulse: 87 85 88 87  Resp: (!) 22 (!) 25 (!) 28 (!) 22  SpO2: 100% 100% 100% 100%    Isolation Precautions No active isolations  Medications Medications  nitroGLYCERIN 50 mg in dextrose 5 % 250 mL (0.2 mg/mL) infusion (55 mcg/min Intravenous Rate/Dose Change 01/13/23 0003)  enalaprilat (VASOTEC) injection 1.25 mg (1.25 mg Intravenous Given 01/12/23 2348)  magnesium sulfate IVPB 2 g 50 mL (0 g Intravenous Stopped 01/12/23 2215)  albuterol (PROVENTIL) (2.5 MG/3ML) 0.083% nebulizer solution 10 mg (10 mg  Nebulization Given 01/12/23 2255)    Mobility walks     Focused Assessments Cardiac Assessment Handoff:  Cardiac Rhythm: Normal sinus rhythm No results found for: "CKTOTAL", "CKMB", "CKMBINDEX", "TROPONINI" No results found for: "DDIMER" Does the Patient currently have chest pain? No   , Pulmonary Assessment Handoff:  Lung sounds: Bilateral Breath Sounds: Inspiratory wheezes, Expiratory wheezes O2 Device: Bi-PAP      R Recommendations: See Admitting Provider Note  Report given to:   Additional Notes: Pt A&OX4. Pt on BIPAP (50%, Rate 15, PEEP 5). Nitro drip infusing.

## 2023-01-13 NOTE — Progress Notes (Signed)
RT transported pt to [facility] room 23 without event.

## 2023-01-13 NOTE — Evaluation (Signed)
Physical Therapy Evaluation Patient Details Name: Marcus Tran MRN: [id number] DOB: [birth year] Today's Date: 01/13/2023  History of Present Illness  Pt is a 72 year old male admitted on 01/12/23 for SOB. 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  Clinical Impression  Pt presents with admitting diagnosis above. Pt was able to ambulate in hallway independently with no AD. Pt received on 2L satting at 100%. During second ambulation bout pt dropped to 1L however O2 sat dropped to 83% requiring titration back to 2L. Pt left on 2L at 100%. Pt presents at or near baseline mobility. Pt has no further acute PT needs and will be signing off. Re consult PT if mobility status changes.        Recommendations for follow up therapy are one component of a multi-disciplinary discharge planning process, led by the attending physician.  Recommendations may be updated based on patient status, additional functional criteria and insurance authorization.  Follow Up Recommendations       Assistance Recommended at Discharge PRN  Patient can return home with the following  Assist for transportation    Equipment Recommendations None recommended by PT  Recommendations for Other Services       Functional Status Assessment Patient has had a recent decline in their functional status and demonstrates the ability to make significant improvements in function in a reasonable and predictable amount of time.     Precautions / Restrictions Precautions Precautions: Fall Restrictions Weight Bearing Restrictions: No      Mobility  Bed Mobility               General bed mobility comments: Pt received seated EOB.    Transfers Overall transfer level: Independent Equipment used: None                     Ambulation/Gait Ambulation/Gait assistance: Independent Gait Distance (Feet): 1000 Feet Assistive device: None Gait Pattern/deviations: WFL(Within Functional Limits)   Gait velocity interpretation: >2.62 ft/sec, indicative of community ambulatory   General Gait Details: no LOB noted. Initial ambulation had SpO2 reading at 79% on 2L however accuracy was questionable and pt switched to different pulse ox.  Stairs            Wheelchair Mobility    Modified Rankin (Stroke Patients Only)       Balance Overall balance assessment: No apparent balance deficits (not formally assessed)                                           Pertinent Vitals/Pain Pain Assessment Pain Assessment: No/denies pain    Home Living Family/patient expects to be discharged to:: Private residence Living Arrangements: Other relatives;Children (daughter and 3 grandkids) Available Help at Discharge: Available PRN/intermittently Type of Home: House Home Access: Level entry     Alternate Level Stairs-Number of Steps: 10 to attic; states that he occasionally goes up Home Layout: One level Home Equipment: Cane - single point      Prior Function Prior Level of Function : Independent/Modified Independent;Driving             Mobility Comments: Ind no AD; Occasioanlly uses cane when legs get tired ADLs Comments: Perfoms all ADLs     Hand Dominance   Dominant Hand:  Right    Extremity/Trunk Assessment   Upper Extremity Assessment Upper Extremity Assessment: Overall WFL for tasks assessed    Lower Extremity Assessment Lower Extremity Assessment: Overall WFL for tasks assessed    Cervical / Trunk Assessment Cervical / Trunk Assessment: Normal  Communication   Communication: No difficulties  Cognition Arousal/Alertness: Awake/alert Behavior During Therapy: WFL for tasks assessed/performed Overall Cognitive Status: Within Functional Limits for tasks assessed                                           General Comments General comments (skin integrity, edema, etc.): Pt received on 2L satting at 100%. During second ambulation bout pt dropped to 1L however O2 sat dropped to 83% requiring titration back to 2L. Pt left on 2L at 100%.    Exercises     Assessment/Plan    PT Assessment Patient does not need any further PT services  PT Problem List         PT Treatment Interventions      PT Goals (Current goals can be found in the Care Plan section)       Frequency       Co-evaluation               AM-PAC PT "6 Clicks" Mobility  Outcome Measure Help needed turning from your back to your side while in a flat bed without using bedrails?: None Help needed moving from lying on your back to sitting on the side of a flat bed without using bedrails?: None Help needed moving to and from a bed to a chair (including a wheelchair)?: None Help needed standing up from a chair using your arms (e.g., wheelchair or bedside chair)?: None Help needed to walk in hospital room?: None Help needed climbing 3-5 steps with a railing? : None 6 Click Score: 24    End of Session Equipment Utilized During Treatment: Gait belt;Oxygen Activity Tolerance: Patient tolerated treatment well Patient left: in bed;with call bell/phone within reach Nurse Communication: Mobility status;Other (comment) (O2 sats) PT Visit Diagnosis: Other abnormalities of gait and mobility (R26.89)    Time: [id number] PT Time Calculation (min) (ACUTE ONLY): 29 min   Charges:   PT Evaluation $PT Eval Low Complexity: 1 Low PT Treatments $Gait Training: 8-22 mins        Shelby Mattocks, PT, DPT Acute Rehab Services [id number]   Viann Shove 01/13/2023, 2:06 PM

## 2023-01-14 ENCOUNTER — Inpatient Hospital Stay (HOSPITAL_COMMUNITY): Payer: 59

## 2023-01-14 DIAGNOSIS — M6282 Rhabdomyolysis: Secondary | ICD-10-CM

## 2023-01-14 DIAGNOSIS — J441 Chronic obstructive pulmonary disease with (acute) exacerbation: Secondary | ICD-10-CM | POA: Diagnosis not present

## 2023-01-14 DIAGNOSIS — J9601 Acute respiratory failure with hypoxia: Secondary | ICD-10-CM | POA: Diagnosis not present

## 2023-01-14 DIAGNOSIS — Z91199 Patient's noncompliance with other medical treatment and regimen due to unspecified reason: Secondary | ICD-10-CM | POA: Diagnosis not present

## 2023-01-14 DIAGNOSIS — N1831 Chronic kidney disease, stage 3a: Secondary | ICD-10-CM | POA: Diagnosis not present

## 2023-01-14 DIAGNOSIS — I5022 Chronic systolic (congestive) heart failure: Secondary | ICD-10-CM

## 2023-01-14 LAB — RENAL FUNCTION PANEL
Albumin: 3.8 g/dL (ref 3.5–5.0)
Anion gap: 16 — ABNORMAL HIGH (ref 5–15)
BUN: 51 mg/dL — ABNORMAL HIGH (ref 8–23)
CO2: 28 mmol/L (ref 22–32)
Calcium: 8.4 mg/dL — ABNORMAL LOW (ref 8.9–10.3)
Chloride: 91 mmol/L — ABNORMAL LOW (ref 98–111)
Creatinine, Ser: 2.9 mg/dL — ABNORMAL HIGH (ref 0.61–1.24)
GFR, Estimated: 22 mL/min — ABNORMAL LOW (ref >60)
Glucose, Bld: 129 mg/dL — ABNORMAL HIGH (ref 70–99)
Phosphorus: 5.3 mg/dL — ABNORMAL HIGH (ref 2.5–4.6)
Potassium: 4 mmol/L (ref 3.5–5.1)
Sodium: 135 mmol/L (ref 135–145)

## 2023-01-14 LAB — CBC
HCT: 42.2 % (ref 39.0–52.0)
Hemoglobin: 13.4 g/dL (ref 13.0–17.0)
MCH: 30 pg (ref 26.0–34.0)
MCHC: 31.8 g/dL (ref 30.0–36.0)
MCV: 94.6 fL (ref 80.0–100.0)
Platelets: 187 10*3/uL (ref 150–400)
RBC: 4.46 MIL/uL (ref 4.22–5.81)
RDW: 13.9 % (ref 11.5–15.5)
WBC: 10.9 10*3/uL — ABNORMAL HIGH (ref 4.0–10.5)
nRBC: 0 % (ref 0.0–0.2)

## 2023-01-14 LAB — RAPID URINE DRUG SCREEN, HOSP PERFORMED
Amphetamines: NOT DETECTED
Barbiturates: NOT DETECTED
Benzodiazepines: NOT DETECTED
Cocaine: POSITIVE — AB
Opiates: NOT DETECTED
Tetrahydrocannabinol: NOT DETECTED

## 2023-01-14 LAB — GLUCOSE, CAPILLARY
Glucose-Capillary: 121 mg/dL — ABNORMAL HIGH (ref 70–99)
Glucose-Capillary: 133 mg/dL — ABNORMAL HIGH (ref 70–99)
Glucose-Capillary: 137 mg/dL — ABNORMAL HIGH (ref 70–99)
Glucose-Capillary: 151 mg/dL — ABNORMAL HIGH (ref 70–99)
Glucose-Capillary: 175 mg/dL — ABNORMAL HIGH (ref 70–99)
Glucose-Capillary: 210 mg/dL — ABNORMAL HIGH (ref 70–99)
Glucose-Capillary: 269 mg/dL — ABNORMAL HIGH (ref 70–99)

## 2023-01-14 LAB — MAGNESIUM: Magnesium: 2.7 mg/dL — ABNORMAL HIGH (ref 1.7–2.4)

## 2023-01-14 LAB — LACTIC ACID, PLASMA
Lactic Acid, Venous: 1.8 mmol/L (ref 0.5–1.9)
Lactic Acid, Venous: 1.6 mmol/L (ref 0.5–1.9)

## 2023-01-14 LAB — CK: Total CK: 1001 U/L — ABNORMAL HIGH (ref 49–397)

## 2023-01-14 MED ORDER — METHYLPREDNISOLONE SODIUM SUCC 125 MG IJ SOLR
80.0000 mg | Freq: Every day | INTRAMUSCULAR | Status: AC
  Administered 2023-01-15 – 2023-01-16 (×2): 80 mg via INTRAVENOUS
  Filled 2023-01-14 (×2): qty 2

## 2023-01-14 MED ORDER — REVEFENACIN 175 MCG/3ML IN SOLN
175.0000 ug | Freq: Every day | RESPIRATORY_TRACT | Status: DC
  Administered 2023-01-14 – 2023-01-16 (×3): 175 ug via RESPIRATORY_TRACT
  Filled 2023-01-14 (×3): qty 3

## 2023-01-14 MED ORDER — ARFORMOTEROL TARTRATE 15 MCG/2ML IN NEBU
15.0000 ug | INHALATION_SOLUTION | Freq: Two times a day (BID) | RESPIRATORY_TRACT | Status: DC
  Administered 2023-01-14 – 2023-01-15 (×3): 15 ug via RESPIRATORY_TRACT
  Filled 2023-01-14 (×3): qty 2

## 2023-01-14 MED ORDER — LACTATED RINGERS IV SOLN: INTRAVENOUS | Status: AC

## 2023-01-14 MED ORDER — BUDESONIDE 0.5 MG/2ML IN SUSP
0.5000 mg | Freq: Two times a day (BID) | RESPIRATORY_TRACT | Status: DC
  Administered 2023-01-14 – 2023-01-15 (×3): 0.5 mg via RESPIRATORY_TRACT
  Filled 2023-01-14 (×3): qty 2

## 2023-01-14 NOTE — Plan of Care (Signed)

## 2023-01-14 NOTE — Progress Notes (Signed)
PROGRESS NOTE  Marcus Tran [id number] DOB: November 22, 1950   PCP: Cipriano Mile, NP  Patient is from: Home.  Lives with daughter.  DOA: 01/12/2023 LOS: 2  Chief complaints Chief Complaint  Patient presents with   Shortness of Breath     Brief Narrative / Interim history: 72 year old M with PMH of COPD, OSA, systolic CHF, CAD, syncope, CKD-3A, DM-2, HTN, HLD and tobacco use disorder presenting with shortness of breath and generalized weakness and admitted for acute respiratory failure with hypoxia in the setting of COPD exacerbation and CHF exacerbation, and AKI.  Patient reports smoking 3 to 4 cigarettes a day.  Seems noncompliant with his Breztri.  Reportedly hypoxic to 70% on RA.  VBG with hypercapnia.  Initially put on NRB and transition to BiPAP in ED.  He was tachypneic to upper 20s.  184.  Troponin 22. Cr 2.95.  BUN 31.  pCO2 67.  CBC without significant finding.  CXR suggested.  Patient was started on Solu-Medrol, scheduled and as needed nebulizers and admitted.  The next day, respiratory status improved.  Weaned off BiPAP to nasal cannula.    Subjective: Seen and examined earlier this morning.  No major events overnight.  Sudden respiratory distress with desaturation earlier this morning while eating breakfast.  Concern about possible aspiration.  Otherwise, he reports feeling better  Objective: Vitals:   01/14/23 0408 01/14/23 0732 01/14/23 0745 01/14/23 1142  BP: 104/81  109/83 (!) 135/94  Pulse: 91  80 79  Resp: 20  20 20   Temp: (!) 97.5 F (36.4 C)  98 F (36.7 C) 98.4 F (36.9 C)  TempSrc: Oral  Oral Oral  SpO2: (!) 89% 96% 100% 93%  Weight: 67.4 kg     Height:        Examination:  GENERAL: Appears frail.  No apparent distress. HEENT: MMM.  Vision and hearing grossly intact.  NECK: Supple.  No apparent JVD.  RESP: Notable WOB.  Breathless with pursed lip.  Significant rhonchi bilaterally CVS:  RRR. Heart sounds normal.  ABD/GI/GU: BS+. Abd soft, NTND.   MSK/EXT:   No apparent deformity. Moves extremities. No edema.  SKIN: no apparent skin lesion or wound NEURO: Awake and alert. Oriented appropriately.  No apparent focal neuro deficit. PSYCH: Calm. Normal affect.   Procedures:  None  Microbiology summarized: None  Assessment and plan: Principal Problem:   Acute respiratory failure with hypoxia and hypercapnia (HCC) Active Problems:   Tobacco use disorder   Chronic HFrEF (heart failure with reduced ejection fraction) (HCC)   HTN (hypertension)   Stage 3a chronic kidney disease (CKD) (HCC)   DM2 (diabetes mellitus, type 2) (HCC)   Chronic combined systolic and diastolic CHF (congestive heart failure) (HCC)   COPD with acute exacerbation (HCC)   Noncompliance  Acute respiratory failure with hypoxia and hypercapnia: Likely due to COPD exacerbation.  Desaturated to 70% on RA when EMS arrived and was started on NRB.  VBG with hypercapnia.  Initially on BiPAP and weaned to nasal cannula.  He was on room air this morning before sudden respiratory distress with desaturation while eating breakfast.  CXR without acute finding. -Wean off oxygen as able.  Minimum oxygen to keep saturation above 90% -Continue Solu-Medrol -Change inhalers to nebulizers-Brovana, Pulmicort and Yupelri -Continue DuoNebs as needed  -Mucolytic's and antitussive as needed -Discussed the importance of smoking cessation and compliance with his inhalers.   -Aspiration precaution.  SLP eval. -Incentive telemetry/OOB/PT/OT  COPD exacerbation: Due to ongoing cigarette smoking and  noncompliance with inhalers. -Management as above  AKI on CKD-3A: Cr 2.91 (baseline 2.95).  Appears euvolemic on exam.  CK elevated to 1000. -Start gentle IV fluid. -Avoid nephrotoxic meds -Continue monitoring -Further workup if no improvement  Chronic combined CHF: TTE in 05/2022 with LVEF of 40 to 45% and G1 DD.  Appears euvolemic on exam.  Has AKI.  Respiratory status improved with COPD  management. -Continue holding diuretics -Gentle IV fluid. -Strict intake and output, daily weight, renal functions and electrolytes  Prolonged QTc: QTc 510. -Avoid QT prolonging drugs -Optimize Mg and K  Controlled NIDDM-2 with hyperglycemia and CKD-3: A1c 6.8% in 10/2022. Recent Labs  Lab 01/13/23 2113 01/14/23 0008 01/14/23 0415 01/14/23 0743 01/14/23 1141  GLUCAP 194* 175* 133* 121* 210*  -Continue current insulin regimen  Essential hypertension: Normotensive. -Continue home BiDil and bisoprolol -Continue holding diuretics  Generalized weakness/physical deconditioning -PT/OT  Noncompliance -Counseled on the importance of compliance with his meds.  Hyperkalemia: Resolved.  Elevated CK/nontraumatic rhabdo: CK elevated to 1000. -Gentle IV fluid as above  Body mass index is 24.73 kg/m.          DVT prophylaxis:  enoxaparin (LOVENOX) injection 30 mg Start: 01/13/23 1000  Code Status: Full code Family Communication: None at bedside Level of care: Progressive Status is: Inpatient Remains inpatient appropriate because: Respiratory failure/COPD exacerbation/AKI   Final disposition: Home Consultants:  None  55 minutes with more than 50% spent in reviewing records, counseling patient/family and coordinating care.   Sch Meds:  Scheduled Meds:  arformoterol  15 mcg Nebulization BID   aspirin EC  81 mg Oral Daily   atorvastatin  80 mg Oral QPM   bisoprolol  5 mg Oral Daily   budesonide (PULMICORT) nebulizer solution  0.5 mg Nebulization BID   enoxaparin (LOVENOX) injection  30 mg Subcutaneous Daily   guaiFENesin  600 mg Oral BID   insulin aspart  0-9 Units Subcutaneous Q4H   isosorbide-hydrALAZINE  1 tablet Oral TID   [START ON 01/15/2023] methylPREDNISolone (SOLU-MEDROL) injection  80 mg Intravenous Daily   montelukast  10 mg Oral QHS   revefenacin  175 mcg Nebulization Daily   Continuous Infusions:  lactated ringers 75 mL/hr at 01/14/23 1005   PRN  Meds:.guaiFENesin-dextromethorphan, ipratropium-albuterol  Antimicrobials: Anti-infectives (From admission, onward)    None        I have personally reviewed the following labs and images: CBC: Recent Labs  Lab 01/12/23 2130 01/13/23 0001 01/13/23 0535 01/14/23 0557  WBC 9.6  --  6.0 10.9*  NEUTROABS 3.2  --  5.4  --   HGB 15.4 16.7 13.6 13.4  HCT 47.5 49.0 41.3 42.2  MCV 91.9  --  91.2 94.6  PLT 229  --  189 187   BMP &GFR Recent Labs  Lab 01/12/23 2130 01/13/23 0001 01/13/23 0535 01/14/23 0537 01/14/23 0557  NA 136 135 134* 135  --   K 5.5* 5.3* 3.9 4.0  --   CL 91*  --  87* 91*  --   CO2 29  --  31 28  --   GLUCOSE 107*  --  162* 129*  --   BUN 29*  --  31* 51*  --   CREATININE 2.91*  --  2.95* 2.90*  --   CALCIUM 8.9  --  8.6* 8.4*  --   MG  --   --  2.8*  --  2.7*  PHOS  --   --  5.7* 5.3*  --  Estimated Creatinine Clearance: 20 mL/min (A) (by C-G formula based on SCr of 2.9 mg/dL (H)). Liver & Pancreas: Recent Labs  Lab 01/12/23 2130 01/13/23 0535 01/14/23 0537  AST 58* 37  --   ALT 27 24  --   ALKPHOS 43 41  --   BILITOT 2.0* 0.9  --   PROT 7.5 6.9  --   ALBUMIN 4.4 3.9 3.8   No results for input(s): "LIPASE", "AMYLASE" in the last 168 hours. No results for input(s): "AMMONIA" in the last 168 hours. Diabetic: No results for input(s): "HGBA1C" in the last 72 hours. Recent Labs  Lab 01/13/23 2113 01/14/23 0008 01/14/23 0415 01/14/23 0743 01/14/23 1141  GLUCAP 194* 175* 133* 121* 210*   Cardiac Enzymes: Recent Labs  Lab 01/14/23 0557  CKTOTAL 1,001*   No results for input(s): "PROBNP" in the last 8760 hours. Coagulation Profile: No results for input(s): "INR", "PROTIME" in the last 168 hours. Thyroid Function Tests: No results for input(s): "TSH", "T4TOTAL", "FREET4", "T3FREE", "THYROIDAB" in the last 72 hours. Lipid Profile: No results for input(s): "CHOL", "HDL", "LDLCALC", "TRIG", "CHOLHDL", "LDLDIRECT" in the last 72  hours. Anemia Panel: No results for input(s): "VITAMINB12", "FOLATE", "FERRITIN", "TIBC", "IRON", "RETICCTPCT" in the last 72 hours. Urine analysis:    Component Value Date/Time   COLORURINE STRAW (A) 03/21/2022 1545   APPEARANCEUR CLEAR 03/21/2022 1545   LABSPEC 1.005 03/21/2022 1545   PHURINE 5.0 03/21/2022 1545   GLUCOSEU >=500 (A) 03/21/2022 1545   HGBUR NEGATIVE 03/21/2022 1545   BILIRUBINUR NEGATIVE 03/21/2022 1545   KETONESUR NEGATIVE 03/21/2022 1545   PROTEINUR NEGATIVE 03/21/2022 1545   NITRITE NEGATIVE 03/21/2022 1545   LEUKOCYTESUR NEGATIVE 03/21/2022 1545   Sepsis Labs: Invalid input(s): "PROCALCITONIN", "LACTICIDVEN"  Microbiology: No results found for this or any previous visit (from the past 240 hour(s)).  Radiology Studies: DG Chest Port 1 View  Result Date: 01/14/2023 CLINICAL DATA:  Provided history: Hypoxemia. EXAM: PORTABLE CHEST 1 VIEW COMPARISON:  Chest radiograph 01/12/2023 and earlier. FINDINGS: Heart size at the upper limits of normal. Enlarged central pulmonary vessels, similar to the prior examination of 01/12/2023. No appreciable airspace consolidation or pulmonary edema. No evidence of pleural effusion or pneumothorax. No acute osseous abnormality identified. IMPRESSION: No evidence of acute cardiopulmonary abnormality. Enlarged central pulmonary vessels, similar to the prior examination and suggesting pulmonary arterial hypertension. Electronically Signed   By: Kellie Simmering D.O.   On: 01/14/2023 10:32      Avenell Sellers T. Sinton  If 7PM-7AM, please contact night-coverage [email or web address] 01/14/2023, 1:50 PM

## 2023-01-14 NOTE — Progress Notes (Signed)
Received call from [place] regarding NSVT (17 beats). Stable vital signs. Asymptomatic. Plan of care ongoing.

## 2023-01-14 NOTE — Evaluation (Signed)
Occupational Therapy Evaluation Patient Details Name: Marcus Tran MRN: [id number] DOB: [birth year] Today's Date: 01/14/2023   History of Present Illness Pt is a 72 year old male admitted on 01/12/23 for SOB. 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   Clinical Impression   Patient admitted for the diagnosis above.  PTA he lives alone, continues to drive, and needs no assist with ADL or iADL completion.  Biggest deficit is desaturating on RA, patient mobilized in the halls on RA and desaturating to 85%.  O2 returned and returned to 92%.  Patient a little unsteady, and OT will follow in the acute setting to address deficits listed.  No post acute OT is anticipated.        Recommendations for follow up therapy are one component of a multi-disciplinary discharge planning process, led by the attending physician.  Recommendations may be updated based on patient status, additional functional criteria and insurance authorization.   Assistance Recommended at Discharge PRN  Patient can return home with the following Assist for transportation    Functional Status Assessment  Patient has had a recent decline in their functional status and demonstrates the ability to make significant improvements in function in a reasonable and predictable amount of time.  Equipment Recommendations  None recommended by OT    Recommendations for Other Services       Precautions / Restrictions Precautions Precautions: Fall Restrictions Weight Bearing Restrictions: No      Mobility Bed Mobility Overal bed mobility: Independent               Patient Response: Cooperative  Transfers Overall transfer level: Needs assistance   Transfers: Sit to/from Stand, Bed to chair/wheelchair/BSC Sit to Stand: Modified independent  (Device/Increase time)     Step pivot transfers: Supervision            Balance Overall balance assessment: Needs assistance Sitting-balance support: Feet supported Sitting balance-Leahy Scale: Good     Standing balance support: No upper extremity supported Standing balance-Leahy Scale: Fair                             ADL either performed or assessed with clinical judgement   ADL       Grooming: Supervision/safety;Oral care;Standing               Lower Body Dressing: Supervision/safety;Sit to/from stand   Toilet Transfer: Sales executive;Ambulation             General ADL Comments: Unsteady     Vision Baseline Vision/History: 0 No visual deficits       Perception     Praxis      Pertinent Vitals/Pain Pain Assessment Pain Assessment: No/denies pain     Hand Dominance Right   Extremity/Trunk Assessment Upper Extremity Assessment Upper Extremity Assessment: Overall WFL for tasks assessed   Lower Extremity Assessment Lower Extremity Assessment: Overall WFL for tasks assessed   Cervical / Trunk Assessment Cervical / Trunk Assessment: Normal   Communication Communication Communication: No difficulties   Cognition Arousal/Alertness: Awake/alert Behavior During Therapy: WFL for tasks assessed/performed Overall Cognitive Status: Within Functional Limits for tasks assessed  General Comments   Watch O2    Exercises     Shoulder Instructions      Home Living Family/patient expects to be discharged to:: Private residence Living Arrangements: Other relatives;Children Available Help at Discharge: Available PRN/intermittently Type of Home: House Home Access: Level entry     Home Layout: One level     Bathroom Shower/Tub: Teacher, early years/pre: Standard     Home Equipment: Cane - single point          Prior Functioning/Environment  Prior Level of Function : Independent/Modified Independent;Driving               ADLs Comments: Perfoms all ADLs        OT Problem List: Impaired balance (sitting and/or standing)      OT Treatment/Interventions: Self-care/ADL training;Therapeutic activities;Patient/family education;Balance training    OT Goals(Current goals can be found in the care plan section) Acute Rehab OT Goals Patient Stated Goal: Return home OT Goal Formulation: With patient Time For Goal Achievement: 01/28/23 Potential to Achieve Goals: Good ADL Goals Pt Will Perform Grooming: Independently;standing Pt Will Perform Lower Body Dressing: Independently;sit to/from stand Pt Will Transfer to Toilet: Independently;ambulating;regular height toilet  OT Frequency: Min 2X/week    Co-evaluation              AM-PAC OT "6 Clicks" Daily Activity     Outcome Measure Help from another person eating meals?: None Help from another person taking care of personal grooming?: None Help from another person toileting, which includes using toliet, bedpan, or urinal?: A Little Help from another person bathing (including washing, rinsing, drying)?: A Little Help from another person to put on and taking off regular upper body clothing?: None Help from another person to put on and taking off regular lower body clothing?: A Little 6 Click Score: 21   End of Session Equipment Utilized During Treatment: Gait belt Nurse Communication: Mobility status  Activity Tolerance: Patient tolerated treatment well Patient left: in bed;with call bell/phone within reach  OT Visit Diagnosis: Unsteadiness on feet (R26.81)                Time: [id number] OT Time Calculation (min): 21 min Charges:  OT General Charges $OT Visit: 1 Visit OT Evaluation $OT Eval Moderate Complexity: 1 Mod  01/14/2023  RP, OTR/L  Acute Rehabilitation Services  Office:  [phone]   Metta Clines 01/14/2023, 10:56 AM

## 2023-01-14 NOTE — Progress Notes (Deleted)
Mobility Specialist Progress Note:   01/14/23 1420  Mobility  Activity Ambulated with assistance in hallway  Level of Assistance Standby assist, set-up cues, supervision of patient - no hands on  Assistive Device None  Distance Ambulated (ft) 350 ft  Activity Response Tolerated well  Mobility Referral Yes  $Mobility charge 1 Mobility   Pt agreeable to mobility session. Required no physical assistance throughout. VSS on 2LO2. Pt back sitting EOB with all needs met.    Nelta Numbers Mobility Specialist Please contact via SecureChat or  Rehab office at [phone]

## 2023-01-14 NOTE — Progress Notes (Signed)
Mobility Specialist Progress Note:   01/14/23 1355  Mobility  Activity Ambulated with assistance in hallway  Level of Assistance Standby assist, set-up cues, supervision of patient - no hands on  Assistive Device None  Distance Ambulated (ft) 400 ft  Activity Response Tolerated well  Mobility Referral Yes  $Mobility charge 1 Mobility   Pt agreeable to mobility session. Required no physical assistance throughout ambulation. VSS on 2LO2. Pt left sitting EOB with all needs met.   Nelta Numbers Mobility Specialist Please contact via SecureChat or  Rehab office at [phone]

## 2023-01-15 DIAGNOSIS — N1831 Chronic kidney disease, stage 3a: Secondary | ICD-10-CM | POA: Diagnosis not present

## 2023-01-15 DIAGNOSIS — J441 Chronic obstructive pulmonary disease with (acute) exacerbation: Secondary | ICD-10-CM | POA: Diagnosis not present

## 2023-01-15 DIAGNOSIS — J9601 Acute respiratory failure with hypoxia: Secondary | ICD-10-CM | POA: Diagnosis not present

## 2023-01-15 DIAGNOSIS — F1490 Cocaine use, unspecified, uncomplicated: Secondary | ICD-10-CM | POA: Insufficient documentation

## 2023-01-15 DIAGNOSIS — Z91199 Patient's noncompliance with other medical treatment and regimen due to unspecified reason: Secondary | ICD-10-CM | POA: Diagnosis not present

## 2023-01-15 LAB — RENAL FUNCTION PANEL
Albumin: 3.6 g/dL (ref 3.5–5.0)
Anion gap: 10 (ref 5–15)
BUN: 50 mg/dL — ABNORMAL HIGH (ref 8–23)
CO2: 30 mmol/L (ref 22–32)
Calcium: 8.2 mg/dL — ABNORMAL LOW (ref 8.9–10.3)
Chloride: 92 mmol/L — ABNORMAL LOW (ref 98–111)
Creatinine, Ser: 2.52 mg/dL — ABNORMAL HIGH (ref 0.61–1.24)
GFR, Estimated: 26 mL/min — ABNORMAL LOW (ref >60)
Glucose, Bld: 103 mg/dL — ABNORMAL HIGH (ref 70–99)
Phosphorus: 3 mg/dL (ref 2.5–4.6)
Potassium: 3.9 mmol/L (ref 3.5–5.1)
Sodium: 132 mmol/L — ABNORMAL LOW (ref 135–145)

## 2023-01-15 LAB — GLUCOSE, CAPILLARY
Glucose-Capillary: 109 mg/dL — ABNORMAL HIGH (ref 70–99)
Glucose-Capillary: 143 mg/dL — ABNORMAL HIGH (ref 70–99)
Glucose-Capillary: 158 mg/dL — ABNORMAL HIGH (ref 70–99)
Glucose-Capillary: 159 mg/dL — ABNORMAL HIGH (ref 70–99)
Glucose-Capillary: 201 mg/dL — ABNORMAL HIGH (ref 70–99)
Glucose-Capillary: 204 mg/dL — ABNORMAL HIGH (ref 70–99)

## 2023-01-15 LAB — CBC
HCT: 37.2 % — ABNORMAL LOW (ref 39.0–52.0)
Hemoglobin: 12 g/dL — ABNORMAL LOW (ref 13.0–17.0)
MCH: 29.9 pg (ref 26.0–34.0)
MCHC: 32.3 g/dL (ref 30.0–36.0)
MCV: 92.5 fL (ref 80.0–100.0)
Platelets: 186 10*3/uL (ref 150–400)
RBC: 4.02 MIL/uL — ABNORMAL LOW (ref 4.22–5.81)
RDW: 13.8 % (ref 11.5–15.5)
WBC: 10.5 10*3/uL (ref 4.0–10.5)
nRBC: 0 % (ref 0.0–0.2)

## 2023-01-15 LAB — CK: Total CK: 651 U/L — ABNORMAL HIGH (ref 49–397)

## 2023-01-15 LAB — MAGNESIUM: Magnesium: 2.6 mg/dL — ABNORMAL HIGH (ref 1.7–2.4)

## 2023-01-15 MED ORDER — SODIUM CHLORIDE 0.9 % IV SOLN: INTRAVENOUS | Status: AC

## 2023-01-15 MED ORDER — MOMETASONE FURO-FORMOTEROL FUM 200-5 MCG/ACT IN AERO
2.0000 | INHALATION_SPRAY | Freq: Two times a day (BID) | RESPIRATORY_TRACT | Status: DC
  Administered 2023-01-15 – 2023-01-16 (×2): 2 via RESPIRATORY_TRACT
  Filled 2023-01-15: qty 8.8

## 2023-01-15 MED ORDER — SALINE SPRAY 0.65 % NA SOLN
1.0000 | NASAL | Status: DC | PRN
  Administered 2023-01-15: 1 via NASAL
  Filled 2023-01-15: qty 44

## 2023-01-15 NOTE — Progress Notes (Signed)
SpO2 = 86%, sustaining on room air. Hooked to oxygen at 2 lpm via [place]. Sp02 = 95%. Plan of care ongoing.

## 2023-01-15 NOTE — Progress Notes (Signed)
Patient had [place] off SPO2 was 85%. Placed on 2 LPM  and patient SPO2 came up to 95%. Scheduled breathing treatment was given. RT will continue to monitor.

## 2023-01-15 NOTE — Progress Notes (Signed)
Patient refusing bi pap at this time.

## 2023-01-15 NOTE — Progress Notes (Signed)
Bipap on standby. Pt resting on 1l [place] with no distress at this time. Will continue to monitor.

## 2023-01-15 NOTE — Plan of Care (Signed)

## 2023-01-15 NOTE — Progress Notes (Signed)
Mobility Specialist Progress Note:   01/15/23 1445  Mobility  Activity Ambulated with assistance in hallway  Level of Assistance Standby assist, set-up cues, supervision of patient - no hands on  Assistive Device None  Distance Ambulated (ft) 400 ft  Activity Response Tolerated well  Mobility Referral Yes  $Mobility charge 1 Mobility   Pt agreeable to mobility session. Required no physical assistance throughout. SpO2 WFL on 1LO2. Pt back sitting EOB with all needs met.  Nelta Numbers Mobility Specialist Please contact via SecureChat or  Rehab office at [phone]

## 2023-01-15 NOTE — Care Management Important Message (Signed)
Important Message  Patient Details  Name: Marcus Tran MRN: [id number] Date of Birth: May 26, 1951   Medicare Important Message Given:  Yes     Shelda Altes 01/15/2023, 11:48 AM

## 2023-01-15 NOTE — Progress Notes (Signed)
CSW received consult for substance resources for patient. Patient reports PTA he comes from home with daughter and grandchildren. CSW offered patient outpatient substance use treatment services resources. Patient politely declined. Patient reports he should have transportation when medically ready for dc. All questions answered. No further questions reported at this time.

## 2023-01-15 NOTE — Progress Notes (Signed)
PROGRESS NOTE  DARCELL GERBINO [id number] DOB: [birth year]   PCP: Cipriano Mile, NP  Patient is from: Home.  Lives with daughter.  DOA: 01/12/2023 LOS: 3  Chief complaints Chief Complaint  Patient presents with   Shortness of Breath     Brief Narrative / Interim history: 72 year old M with PMH of COPD, OSA, systolic CHF, CAD, syncope, CKD-3A, DM-2, HTN, HLD and tobacco use disorder presenting with shortness of breath and generalized weakness and admitted for acute respiratory failure with hypoxia in the setting of COPD exacerbation and CHF exacerbation, and AKI.  Patient reports smoking 3 to 4 cigarettes a day.  Seems noncompliant with his Breztri.  Reportedly hypoxic to 70% on RA.  VBG with hypercapnia.  Initially put on NRB and transition to BiPAP in ED.  He was tachypneic to upper 20s.  184.  Troponin 22. Cr 2.95.  BUN 31.  pCO2 67.  CBC without significant finding.  CXR suggested.  Patient was started on Solu-Medrol, scheduled and as needed nebulizers and admitted.  The next day, respiratory status improved.  Weaned off BiPAP to nasal cannula.  Improving.    Subjective: Seen and examined earlier this morning.  Had 17 beats of NSVT overnight.  Reports improvement in his breathing.  Denies chest pain, palpitation or dizziness.  Objective: Vitals:   01/15/23 0500 01/15/23 0735 01/15/23 0744 01/15/23 1136  BP:   (!) 141/85 125/85  Pulse:   72 69  Resp:   20 17  Temp:   98.5 F (36.9 C) 98.4 F (36.9 C)  TempSrc:   Axillary Oral  SpO2:  (!) 86% 92% 95%  Weight: 68.1 kg     Height:        Examination: GENERAL: No apparent distress.  Nontoxic. HEENT: MMM.  Vision and hearing grossly intact.  NECK: Supple.  No apparent JVD.  RESP:  No IWOB.  Fair aeration bilaterally.  Some rhonchi bilaterally. CVS:  RRR. Heart sounds normal.  ABD/GI/GU: BS+. Abd soft, NTND.  MSK/EXT:   No apparent deformity. Moves extremities. No edema.  SKIN: no apparent skin lesion or wound NEURO: Awake  and alert. Oriented appropriately.  No apparent focal neuro deficit. PSYCH: Calm. Normal affect.   Procedures:  None  Microbiology summarized: None  Assessment and plan: Principal Problem:   Acute respiratory failure with hypoxia and hypercapnia (HCC) Active Problems:   Tobacco use disorder   Chronic HFrEF (heart failure with reduced ejection fraction) (HCC)   HTN (hypertension)   Stage 3a chronic kidney disease (CKD) (HCC)   DM2 (diabetes mellitus, type 2) (HCC)   Chronic combined systolic and diastolic CHF (congestive heart failure) (HCC)   COPD with acute exacerbation (HCC)   Noncompliance  Acute respiratory failure with hypoxia and hypercapnia: Likely due to COPD exacerbation.  Desaturated to 70% on RA when EMS arrived and was started on NRB.  VBG with hypercapnia.  Initially on BiPAP and weaned to nasal cannula.  He was on room air this morning before sudden respiratory distress with desaturation while eating breakfast.  CXR without acute finding. -Wean off oxygen as able.  Minimum oxygen to keep saturation above 90% -Continue Solu-Medrol -Continue Yupelri.  Changed Brovana and Pulmicort to [facility]. -Continue DuoNebs as needed  -Mucolytic's and antitussive as needed -Discussed the importance of smoking cessation and compliance with his inhalers.   -Aspiration precaution. -Appreciate input by SLP-mild risk for aspiration.  Regular diet. -Incentive telemetry/OOB/PT/OT  COPD exacerbation: Due to ongoing cigarette smoking and noncompliance with inhalers. -  Management as above  AKI on CKD-3A: Cr 2.91 (baseline 2.95).  Likely due to diuretics, cocaine and mild rhabdo.  Improving. -Continue gentle IV fluid -Avoid nephrotoxic meds -Continue monitoring -Advised to refrain from cocaine use.  Chronic combined CHF: TTE in 05/2022 with LVEF of 40 to 45% and G1 DD.  Appears euvolemic on exam.  Has AKI.  Respiratory status improved with COPD management. -Continue holding  diuretics -Continue gentle IV fluid. -Strict intake and output, daily weight, renal functions and electrolytes  Prolonged QTc: QTc 510. -Avoid QT prolonging drugs -Optimize Mg and K  Controlled NIDDM-2 with hyperglycemia and CKD-3: A1c 6.8% in 10/2022. Recent Labs  Lab 01/14/23 2039 01/14/23 2322 01/15/23 0353 01/15/23 0744 01/15/23 1135  GLUCAP 269* 151* 143* 109* 158*  -Continue current insulin regimen  Essential hypertension: Normotensive. -Continue home BiDil and bisoprolol -Continue holding diuretics  Generalized weakness/physical deconditioning -PT/OT  Noncompliance -Counseled on the importance of compliance with his meds.  Hyperkalemia: Resolved.  Elevated CK/nontraumatic rhabdo: Improving. -Gentle IV fluid as above -Hold statin. -Advised to refrain from cocaine.  Cocaine use: Patient denies recent use despite positive UDS. -Advised to refrain from cocaine use -TOC consult  Body mass index is 24.98 kg/m.          DVT prophylaxis:  enoxaparin (LOVENOX) injection 30 mg Start: 01/13/23 1000  Code Status: Full code Family Communication: None at bedside Level of care: Med-Surg Status is: Inpatient Remains inpatient appropriate because: Respiratory failure/COPD exacerbation/AKI   Final disposition: Home Consultants:  None  35 minutes with more than 50% spent in reviewing records, counseling patient/family and coordinating care.   Sch Meds:  Scheduled Meds:  aspirin EC  81 mg Oral Daily   bisoprolol  5 mg Oral Daily   enoxaparin (LOVENOX) injection  30 mg Subcutaneous Daily   guaiFENesin  600 mg Oral BID   insulin aspart  0-9 Units Subcutaneous Q4H   isosorbide-hydrALAZINE  1 tablet Oral TID   methylPREDNISolone (SOLU-MEDROL) injection  80 mg Intravenous Daily   mometasone-formoterol  2 puff Inhalation BID   montelukast  10 mg Oral QHS   revefenacin  175 mcg Nebulization Daily   Continuous Infusions:  sodium chloride 75 mL/hr at 01/15/23  1003   PRN Meds:.guaiFENesin-dextromethorphan, ipratropium-albuterol, sodium chloride  Antimicrobials: Anti-infectives (From admission, onward)    None        I have personally reviewed the following labs and images: CBC: Recent Labs  Lab 01/12/23 2130 01/13/23 0001 01/13/23 0535 01/14/23 0557 01/15/23 0212  WBC 9.6  --  6.0 10.9* 10.5  NEUTROABS 3.2  --  5.4  --   --   HGB 15.4 16.7 13.6 13.4 12.0*  HCT 47.5 49.0 41.3 42.2 37.2*  MCV 91.9  --  91.2 94.6 92.5  PLT 229  --  189 187 186   BMP &GFR Recent Labs  Lab 01/12/23 2130 01/13/23 0001 01/13/23 0535 01/14/23 0537 01/14/23 0557 01/15/23 0212  NA 136 135 134* 135  --  132*  K 5.5* 5.3* 3.9 4.0  --  3.9  CL 91*  --  87* 91*  --  92*  CO2 29  --  31 28  --  30  GLUCOSE 107*  --  162* 129*  --  103*  BUN 29*  --  31* 51*  --  50*  CREATININE 2.91*  --  2.95* 2.90*  --  2.52*  CALCIUM 8.9  --  8.6* 8.4*  --  8.2*  MG  --   --  2.8*  --  2.7* 2.6*  PHOS  --   --  5.7* 5.3*  --  3.0   Estimated Creatinine Clearance: 23 mL/min (A) (by C-G formula based on SCr of 2.52 mg/dL (H)). Liver & Pancreas: Recent Labs  Lab 01/12/23 2130 01/13/23 0535 01/14/23 0537 01/15/23 0212  AST 58* 37  --   --   ALT 27 24  --   --   ALKPHOS 43 41  --   --   BILITOT 2.0* 0.9  --   --   PROT 7.5 6.9  --   --   ALBUMIN 4.4 3.9 3.8 3.6   No results for input(s): "LIPASE", "AMYLASE" in the last 168 hours. No results for input(s): "AMMONIA" in the last 168 hours. Diabetic: No results for input(s): "HGBA1C" in the last 72 hours. Recent Labs  Lab 01/14/23 2039 01/14/23 2322 01/15/23 0353 01/15/23 0744 01/15/23 1135  GLUCAP 269* 151* 143* 109* 158*   Cardiac Enzymes: Recent Labs  Lab 01/14/23 0557 01/15/23 0209  CKTOTAL 1,001* 651*   No results for input(s): "PROBNP" in the last 8760 hours. Coagulation Profile: No results for input(s): "INR", "PROTIME" in the last 168 hours. Thyroid Function Tests: No results for  input(s): "TSH", "T4TOTAL", "FREET4", "T3FREE", "THYROIDAB" in the last 72 hours. Lipid Profile: No results for input(s): "CHOL", "HDL", "LDLCALC", "TRIG", "CHOLHDL", "LDLDIRECT" in the last 72 hours. Anemia Panel: No results for input(s): "VITAMINB12", "FOLATE", "FERRITIN", "TIBC", "IRON", "RETICCTPCT" in the last 72 hours. Urine analysis:    Component Value Date/Time   COLORURINE STRAW (A) 03/21/2022 1545   APPEARANCEUR CLEAR 03/21/2022 1545   LABSPEC 1.005 03/21/2022 1545   PHURINE 5.0 03/21/2022 1545   GLUCOSEU >=500 (A) 03/21/2022 1545   HGBUR NEGATIVE 03/21/2022 1545   BILIRUBINUR NEGATIVE 03/21/2022 1545   KETONESUR NEGATIVE 03/21/2022 1545   PROTEINUR NEGATIVE 03/21/2022 1545   NITRITE NEGATIVE 03/21/2022 1545   LEUKOCYTESUR NEGATIVE 03/21/2022 1545   Sepsis Labs: Invalid input(s): "PROCALCITONIN", "LACTICIDVEN"  Microbiology: No results found for this or any previous visit (from the past 240 hour(s)).  Radiology Studies: No results found.    Daaiel Starlin T. [place]  If 7PM-7AM, please contact night-coverage [email or web address] 01/15/2023, 1:27 PM

## 2023-01-15 NOTE — Evaluation (Signed)
Clinical/Bedside Swallow Evaluation Patient Details  Name: Marcus Tran MRN: [id number] Date of Birth: 1951-05-10  Today's Date: 01/15/2023 Time: SLP Start Time (ACUTE ONLY): 1228 SLP Stop Time (ACUTE ONLY): 1237 SLP Time Calculation (min) (ACUTE ONLY): 9 min  Past Medical History:  Past Medical History:  Diagnosis Date   Acute metabolic encephalopathy 99991111   Acute respiratory failure with hypoxia ([place]) 03/20/2022   Acute respiratory failure with hypoxia and hypercapnia ([place]) 03/06/2022   CHF (congestive heart failure) ([place])    Dyslipidemia    Hypertension    Past Surgical History:  Past Surgical History:  Procedure Laterality Date   LEG SURGERY     Right-sided as result of trauma   RIGHT/LEFT HEART CATH AND CORONARY ANGIOGRAPHY N/A 03/09/2022   Procedure: RIGHT/LEFT HEART CATH AND CORONARY ANGIOGRAPHY;  Surgeon: Lorretta Harp, MD;  Location: [facility];  Service: Cardiovascular;  Laterality: N/A;   Umbilicus surgery     As a child   HPI:  Pt is a 72 yo male presenting to ED with acute shortness of breath. CXR unremarkable for acute cardiopulmonary abnormality. Per MD note, pt experienced sudden respiratory distress with desaturation while eating breakfast, which is concerning for possible aspiration. PMH includes syncope, COPD, OSA, asthma, CKD 3A, HFrEF 40-45% s/p nonischemic cardiomyopathy, HTN, DM2, HLD, CAD, tobacco abuse    Assessment / Plan / Recommendation  Clinical Impression  Pt seen for bedside swallowing evaluation after respiratory distress with desaturation noted per MD note while eating breakfast yesterday. Oral motor exam WFL. Pt observed during trials of thin liquids, purees, and solids with no overt s/s of dysphagia or aspiration. Suspect respiratory status may impact coordination of swallowing and breathing during more taxing meals; however, pt endorses never having difficulty. Education provided regarding sitting fully upright during mealtimes. Current  diet of regular textures and thin liquids remains appropriate and no further SLP f/u is necessary at this time. SLP Visit Diagnosis: Dysphagia, unspecified (R13.10)    Aspiration Risk  Mild aspiration risk    Diet Recommendation Regular;Thin liquid   Liquid Administration via: Cup;Straw Medication Administration: Whole meds with liquid Supervision: Patient able to self feed Compensations: Slow rate;Small sips/bites Postural Changes: Seated upright at 90 degrees;Remain upright for at least 30 minutes after po intake    Other  Recommendations Oral Care Recommendations: Oral care BID    Recommendations for follow up therapy are one component of a multi-disciplinary discharge planning process, led by the attending physician.  Recommendations may be updated based on patient status, additional functional criteria and insurance authorization.  Follow up Recommendations No SLP follow up      Assistance Recommended at Discharge    Functional Status Assessment Patient has not had a recent decline in their functional status  Frequency and Duration            Prognosis Prognosis for improved oropharyngeal function: Good      Swallow Study   General HPI: Pt is a 72 yo male presenting to ED with acute shortness of breath. CXR unremarkable for acute cardiopulmonary abnormality. Per MD note, pt experienced sudden respiratory distress with desaturation while eating breakfast, which is concerning for possible aspiration. PMH includes syncope, COPD, OSA, asthma, CKD 3A, HFrEF 40-45% s/p nonischemic cardiomyopathy, HTN, DM2, HLD, CAD, tobacco abuse Type of Study: Bedside Swallow Evaluation Previous Swallow Assessment: none in chart Diet Prior to this Study: Regular;Thin liquids (Level 0) Temperature Spikes Noted: No Respiratory Status: Room air History of Recent Intubation: No  Behavior/Cognition: Alert;Cooperative;Pleasant mood Oral Cavity Assessment: Within Functional Limits Oral Care  Completed by SLP: No Oral Cavity - Dentition: Adequate natural dentition Vision: Functional for self-feeding Self-Feeding Abilities: Able to feed self Patient Positioning: Upright in bed Baseline Vocal Quality: Normal Volitional Cough: Strong Volitional Swallow: Able to elicit    Oral/Motor/Sensory Function Overall Oral Motor/Sensory Function: Within functional limits   Ice Chips Ice chips: Not tested   Thin Liquid Thin Liquid: Within functional limits Presentation: Self Fed;Straw    Nectar Thick Nectar Thick Liquid: Not tested   Honey Thick Honey Thick Liquid: Not tested   Puree Puree: Within functional limits Presentation: Spoon;Self Fed   Solid     Solid: Within functional limits Presentation: Spoon;Self Fed      Fabio Asa., Student SLP  01/15/2023,12:57 PM

## 2023-01-16 DIAGNOSIS — Z91199 Patient's noncompliance with other medical treatment and regimen due to unspecified reason: Secondary | ICD-10-CM | POA: Diagnosis not present

## 2023-01-16 DIAGNOSIS — J441 Chronic obstructive pulmonary disease with (acute) exacerbation: Secondary | ICD-10-CM | POA: Diagnosis not present

## 2023-01-16 DIAGNOSIS — F1490 Cocaine use, unspecified, uncomplicated: Secondary | ICD-10-CM | POA: Diagnosis not present

## 2023-01-16 DIAGNOSIS — J9601 Acute respiratory failure with hypoxia: Secondary | ICD-10-CM | POA: Diagnosis not present

## 2023-01-16 LAB — MAGNESIUM: Magnesium: 2.2 mg/dL (ref 1.7–2.4)

## 2023-01-16 LAB — COMPREHENSIVE METABOLIC PANEL
ALT: 40 U/L (ref 0–44)
AST: 35 U/L (ref 15–41)
Albumin: 3.2 g/dL — ABNORMAL LOW (ref 3.5–5.0)
Alkaline Phosphatase: 41 U/L (ref 38–126)
Anion gap: 10 (ref 5–15)
BUN: 30 mg/dL — ABNORMAL HIGH (ref 8–23)
CO2: 25 mmol/L (ref 22–32)
Calcium: 7.7 mg/dL — ABNORMAL LOW (ref 8.9–10.3)
Chloride: 95 mmol/L — ABNORMAL LOW (ref 98–111)
Creatinine, Ser: 1.74 mg/dL — ABNORMAL HIGH (ref 0.61–1.24)
GFR, Estimated: 41 mL/min — ABNORMAL LOW (ref >60)
Glucose, Bld: 171 mg/dL — ABNORMAL HIGH (ref 70–99)
Potassium: 4.1 mmol/L (ref 3.5–5.1)
Sodium: 130 mmol/L — ABNORMAL LOW (ref 135–145)
Total Bilirubin: 0.8 mg/dL (ref 0.3–1.2)
Total Protein: 5.8 g/dL — ABNORMAL LOW (ref 6.5–8.1)

## 2023-01-16 LAB — GLUCOSE, CAPILLARY
Glucose-Capillary: 144 mg/dL — ABNORMAL HIGH (ref 70–99)
Glucose-Capillary: 120 mg/dL — ABNORMAL HIGH (ref 70–99)

## 2023-01-16 LAB — CK: Total CK: 310 U/L (ref 49–397)

## 2023-01-16 LAB — PHOSPHORUS: Phosphorus: 2.1 mg/dL — ABNORMAL LOW (ref 2.5–4.6)

## 2023-01-16 MED ORDER — ALBUTEROL SULFATE HFA 108 (90 BASE) MCG/ACT IN AERS: 2.0000 | INHALATION_SPRAY | RESPIRATORY_TRACT | 2 refills | Status: DC | PRN

## 2023-01-16 MED ORDER — BREZTRI AEROSPHERE 160-9-4.8 MCG/ACT IN AERO: 2.0000 | INHALATION_SPRAY | RESPIRATORY_TRACT | 2 refills | Status: DC | PRN

## 2023-01-16 MED ORDER — PREDNISONE 50 MG PO TABS: 50.0000 mg | ORAL_TABLET | Freq: Every day | ORAL | 0 refills | Status: AC

## 2023-01-16 MED ORDER — FUROSEMIDE 40 MG PO TABS: 40.0000 mg | ORAL_TABLET | Freq: Two times a day (BID) | ORAL | 3 refills | Status: DC

## 2023-01-16 NOTE — Discharge Summary (Signed)
Physician Discharge Summary  Marcus Tran [id number] DOB: April 07, 1951 DOA: 01/12/2023  PCP: Cipriano Mile, NP  Admit date: 01/12/2023 Discharge date: 01/16/2023 Admitted From: Home Disposition: Home Recommendations for Outpatient Follow-up:  Follow up with PCP in 1 to 2 weeks Outpatient follow-up with cardiology and pulmonology as soon as possible Check fluid status, blood pressure, CMP and CBC at follow-up Please follow up on the following pending results: None  Home Health: None Equipment/Devices: None  Discharge Condition: Stable CODE STATUS: Full code  Follow-up Information     Cipriano Mile, NP Follow up.   Why: Call to make a follow up appointment with your primary care provider for a week after discharge Contact information: [place] [address] [phone]                 Hospital course 72 year old M with PMH of COPD, OSA, systolic CHF, CAD, syncope, CKD-3A, DM-2, HTN, HLD and tobacco use disorder presenting with shortness of breath and generalized weakness and admitted for acute respiratory failure with hypoxia in the setting of COPD exacerbation and CHF exacerbation, and AKI.  Patient reports smoking 3 to 4 cigarettes a day.  Seems noncompliant with his Breztri.  Reportedly hypoxic to 70% on RA.  VBG with hypercapnia.  Initially put on NRB and transition to BiPAP in ED.  He was tachypneic to upper 20s.  184.  Troponin 22. Cr 2.95.  BUN 31.  pCO2 67.  CBC without significant finding.  CXR suggested PAH.  Patient was started on Solu-Medrol, scheduled and as needed nebulizers and admitted.  UDS positive for cocaine.   The next day, respiratory status improved.  Weaned off BiPAP to nasal cannula.   On the day of discharge, respiratory status improved and he felt well and back to baseline.  AKI improved.  He is discharged on p.o. prednisone to complete treatment course for exacerbation.  Will need home prescription for Breztri and albuterol.   Counseled on the importance of compliance with his medication, smoking cessation and cocaine cessation.  Discontinued Aldactone and Entresto to allow for further renal recovery.  Advised to hold Lasix for the next 1 week.  Recommend outpatient follow-up with PCP, cardiology and pulmonology.  Reassess BP, fluid status, renal functions and compliance at follow-up.   See individual problem list below for more.   Problems addressed during this hospitalization Principal Problem:   Acute respiratory failure with hypoxia and hypercapnia (HCC) Active Problems:   Tobacco use disorder   Chronic HFrEF (heart failure with reduced ejection fraction) (HCC)   HTN (hypertension)   Stage 3a chronic kidney disease (CKD) (HCC)   DM2 (diabetes mellitus, type 2) (HCC)   Chronic combined systolic and diastolic CHF (congestive heart failure) (HCC)   COPD with acute exacerbation (HCC)   Noncompliance   Cocaine use   Acute respiratory failure with hypoxia and hypercapnia: Likely due to COPD exacerbation.  Desaturated to 70% on RA when EMS arrived and was started on NRB.  VBG with hypercapnia.  Initially on BiPAP and weaned to nasal cannula down to room air. -Discharged on prednisone, Breztri and albuterol. -Counseled on the importance of compliance with meds, smoking cessation and cocaine use cessation.  COPD exacerbation: Due to ongoing cigarette smoking and noncompliance with inhalers. -Management as above   AKI on CKD-3A: Cr 2.91 (1.47 in 11/2022).  Likely due to diuretics, Entresto, cocaine and mild rhabdo.  Improved with IV fluid. -Discontinued Entresto and Aldactone -Advised to hold Lasix for 1  week -Advised to refrain from cocaine -Recheck renal function at follow-up.   Chronic combined CHF: TTE in 05/2022 with LVEF of 40 to 45% and G1 DD.  Appears euvolemic on exam.  Has AKI.  Respiratory status improved with COPD management. -Advised to hold Lasix for 1 week -Discontinued Entresto and Aldactone in  the setting of AKI -Continue BiDil and bisoprolol -Assess fluid status, BP, renal functions and electrolytes at follow-up  Prolonged QTc: QTc 510. -Avoid QT prolonging drugs   Controlled NIDDM-2 with hyperglycemia and CKD-3: A1c 6.8% in 10/2022. -Continue home medication   Essential hypertension: Normotensive. -Continue home BiDil and bisoprolol   Generalized weakness/physical deconditioning -PT/OT-no need identified.   Noncompliance -Counseled on the importance of compliance with his meds.   Hyperkalemia: Resolved.   Elevated CK/nontraumatic rhabdo: Resolved. -Advised to refrain from cocaine   Cocaine use: Patient denies recent use despite positive UDS. -Advised to refrain from cocaine use           Time spent 45 minutes  Vital signs Vitals:   01/16/23 0700 01/16/23 0849 01/16/23 0850 01/16/23 0900  BP: (!) 159/92   (!) 161/92  Pulse: 73 83  72  Temp: 98.3 F (36.8 C)     Resp: 18     Height:      Weight:      SpO2: 93% 97% 99% 97%  TempSrc: Oral     BMI (Calculated):         Discharge exam  GENERAL: No apparent distress.  Nontoxic. HEENT: MMM.  Vision and hearing grossly intact.  NECK: Supple.  No apparent JVD.  RESP:  No IWOB.  Fair aeration bilaterally. CVS:  RRR. Heart sounds normal.  ABD/GI/GU: BS+. Abd soft, NTND.  MSK/EXT:  Moves extremities. No apparent deformity. No edema.  SKIN: no apparent skin lesion or wound NEURO: Awake and alert. Oriented appropriately.  No apparent focal neuro deficit. PSYCH: Calm. Normal affect.  Discharge Instructions Discharge Instructions     (HEART FAILURE PATIENTS) Call MD:  Anytime you have any of the following symptoms: 1) 3 pound weight gain in 24 hours or 5 pounds in 1 week 2) shortness of breath, with or without a dry hacking cough 3) swelling in the hands, feet or stomach 4) if you have to sleep on extra pillows at night in order to breathe.   Complete by: As directed    Call MD for:  difficulty breathing,  headache or visual disturbances   Complete by: As directed    Call MD for:  extreme fatigue   Complete by: As directed    Call MD for:  persistant dizziness or light-headedness   Complete by: As directed    Diet - low sodium heart healthy   Complete by: As directed    Discharge instructions   Complete by: As directed    It has been a pleasure taking care of you!  You were hospitalized due to COPD exacerbation and acute kidney injury.  You have been treated with a steroid and breathing treatment for COPD exacerbation.  We are discharging you on more steroid.  It is very important that you use your medications as prescribed.  Equally important that you quit smoking cigarettes and using cocaine.  In regards to acute kidney injury, your kidney function improved with intravenous fluid.  We discontinued medications that could affect your kidney.  Please review your new medication list and the directions on your medications before taking them.  Follow-up with your primary care  doctor, pulmonologist and cardiologist in 1 to 2 weeks or sooner if needed  It is important that you quit smoking cigarettes.  You may use nicotine patch to help you quit smoking.  Nicotine patch is available over-the-counter.  You may also discuss other options to help you quit smoking with your primary care doctor. You can also talk to professional counselors at 1-800-QUIT-NOW [phone]) for free smoking cessation counseling.     Take care,   Increase activity slowly   Complete by: As directed       Allergies as of 01/16/2023   No Known Allergies      Medication List     STOP taking these medications    Dextromethorphan-guaiFENesin 10-100 MG/5ML liquid   Entresto 24-26 MG Generic drug: sacubitril-valsartan   spironolactone 25 MG tablet Commonly known as: ALDACTONE       TAKE these medications    albuterol 108 (90 Base) MCG/ACT inhaler Commonly known as: VENTOLIN HFA Inhale 2 puffs into the  lungs every 4 (four) hours as needed for wheezing or shortness of breath.   aspirin EC 81 MG tablet Take 1 tablet (81 mg total) by mouth daily. Swallow whole.   atorvastatin 80 MG tablet Commonly known as: LIPITOR Take 1 tablet (80 mg total) by mouth every evening.   bisoprolol 5 MG tablet Commonly known as: ZEBETA TAKE 1 Tablet BY MOUTH ONCE DAILY   Breztri Aerosphere 160-9-4.8 MCG/ACT Aero Generic drug: Budeson-Glycopyrrol-Formoterol Inhale 2 puffs into the lungs as needed.   Farxiga 10 MG Tabs tablet Generic drug: dapagliflozin propanediol TAKE 1 Tablet BY MOUTH ONCE DAILY   furosemide 40 MG tablet Commonly known as: LASIX Take 1 tablet (40 mg total) by mouth 2 (two) times daily. Start taking on: January 23, 2023 What changed: These instructions start on January 23, 2023. If you are unsure what to do until then, ask your doctor or other care provider.   ipratropium-albuterol 0.5-2.5 (3) MG/3ML Soln Commonly known as: DUONEB Inhale 3 mLs into the lungs in the morning, at noon, in the evening, and at bedtime. What changed:  when to take this additional instructions   isosorbide-hydrALAZINE 20-37.5 MG tablet Commonly known as: BiDil Take 1 tablet by mouth 3 (three) times daily.   ketoconazole 2 % cream Commonly known as: NIZORAL Apply 1 Application topically daily as needed for irritation.   metFORMIN 500 MG tablet Commonly known as: GLUCOPHAGE Take 1 tablet (500 mg total) by mouth 2 (two) times daily with a meal.   montelukast 10 MG tablet Commonly known as: SINGULAIR Take 10 mg by mouth at bedtime.   predniSONE 50 MG tablet Commonly known as: DELTASONE Take 1 tablet (50 mg total) by mouth daily for 3 days. Start taking on: January 17, 2023 What changed:  medication strength how much to take when to take this        Consultations: None  Procedures/Studies:   DG Chest Port 1 View  Result Date: 01/14/2023 CLINICAL DATA:  Provided history: Hypoxemia. EXAM:  PORTABLE CHEST 1 VIEW COMPARISON:  Chest radiograph 01/12/2023 and earlier. FINDINGS: Heart size at the upper limits of normal. Enlarged central pulmonary vessels, similar to the prior examination of 01/12/2023. No appreciable airspace consolidation or pulmonary edema. No evidence of pleural effusion or pneumothorax. No acute osseous abnormality identified. IMPRESSION: No evidence of acute cardiopulmonary abnormality. Enlarged central pulmonary vessels, similar to the prior examination and suggesting pulmonary arterial hypertension. Electronically Signed   By: Kellie Simmering D.O.   On:  01/14/2023 10:32   DG Chest Port 1 View  Result Date: 01/12/2023 CLINICAL DATA:  Shortness of breath EXAM: PORTABLE CHEST 1 VIEW COMPARISON:  11/10/2022 FINDINGS: No acute airspace disease. Enlarged central pulmonary vessels. Normal cardiac size. No pneumothorax IMPRESSION: No active disease. Enlarged central pulmonary vessels suggesting pulmonary arterial hypertension. Electronically Signed   By: Donavan Foil M.D.   On: 01/12/2023 22:00       The results of significant diagnostics from this hospitalization (including imaging, microbiology, ancillary and laboratory) are listed below for reference.     Microbiology: No results found for this or any previous visit (from the past 240 hour(s)).   Labs:  CBC: Recent Labs  Lab 01/12/23 2130 01/13/23 0001 01/13/23 0535 01/14/23 0557 01/15/23 0212  WBC 9.6  --  6.0 10.9* 10.5  NEUTROABS 3.2  --  5.4  --   --   HGB 15.4 16.7 13.6 13.4 12.0*  HCT 47.5 49.0 41.3 42.2 37.2*  MCV 91.9  --  91.2 94.6 92.5  PLT 229  --  189 187 186   BMP &GFR Recent Labs  Lab 01/12/23 2130 01/13/23 0001 01/13/23 0535 01/14/23 0537 01/14/23 0557 01/15/23 0212 01/16/23 0236  NA 136 135 134* 135  --  132* 130*  K 5.5* 5.3* 3.9 4.0  --  3.9 4.1  CL 91*  --  87* 91*  --  92* 95*  CO2 29  --  31 28  --  30 25  GLUCOSE 107*  --  162* 129*  --  103* 171*  BUN 29*  --  31* 51*   --  50* 30*  CREATININE 2.91*  --  2.95* 2.90*  --  2.52* 1.74*  CALCIUM 8.9  --  8.6* 8.4*  --  8.2* 7.7*  MG  --   --  2.8*  --  2.7* 2.6* 2.2  PHOS  --   --  5.7* 5.3*  --  3.0 2.1*   Estimated Creatinine Clearance: 33.4 mL/min (A) (by C-G formula based on SCr of 1.74 mg/dL (H)). Liver & Pancreas: Recent Labs  Lab 01/12/23 2130 01/13/23 0535 01/14/23 0537 01/15/23 0212 01/16/23 0236  AST 58* 37  --   --  35  ALT 27 24  --   --  40  ALKPHOS 43 41  --   --  41  BILITOT 2.0* 0.9  --   --  0.8  PROT 7.5 6.9  --   --  5.8*  ALBUMIN 4.4 3.9 3.8 3.6 3.2*   No results for input(s): "LIPASE", "AMYLASE" in the last 168 hours. No results for input(s): "AMMONIA" in the last 168 hours. Diabetic: No results for input(s): "HGBA1C" in the last 72 hours. Recent Labs  Lab 01/15/23 1617 01/15/23 2030 01/15/23 2337 01/16/23 0458 01/16/23 0802  GLUCAP 204* 201* 159* 144* 120*   Cardiac Enzymes: Recent Labs  Lab 01/14/23 0557 01/15/23 0209 01/16/23 0236  CKTOTAL 1,001* 651* 310   No results for input(s): "PROBNP" in the last 8760 hours. Coagulation Profile: No results for input(s): "INR", "PROTIME" in the last 168 hours. Thyroid Function Tests: No results for input(s): "TSH", "T4TOTAL", "FREET4", "T3FREE", "THYROIDAB" in the last 72 hours. Lipid Profile: No results for input(s): "CHOL", "HDL", "LDLCALC", "TRIG", "CHOLHDL", "LDLDIRECT" in the last 72 hours. Anemia Panel: No results for input(s): "VITAMINB12", "FOLATE", "FERRITIN", "TIBC", "IRON", "RETICCTPCT" in the last 72 hours. Urine analysis:    Component Value Date/Time   COLORURINE STRAW (A) 03/21/2022 1545  APPEARANCEUR CLEAR 03/21/2022 1545   LABSPEC 1.005 03/21/2022 1545   PHURINE 5.0 03/21/2022 1545   GLUCOSEU >=500 (A) 03/21/2022 1545   HGBUR NEGATIVE 03/21/2022 [place] 03/21/2022 1545   KETONESUR NEGATIVE 03/21/2022 1545   PROTEINUR NEGATIVE 03/21/2022 1545   NITRITE NEGATIVE 03/21/2022  1545   LEUKOCYTESUR NEGATIVE 03/21/2022 1545   Sepsis Labs: Invalid input(s): "PROCALCITONIN", "LACTICIDVEN"   SIGNED:  Mercy Riding, MD  [facility] 01/16/2023, 4:07 PM

## 2023-01-20 ENCOUNTER — Other Ambulatory Visit (HOSPITAL_COMMUNITY): Payer: Self-pay | Admitting: Emergency Medicine

## 2023-01-20 NOTE — Progress Notes (Signed)
Paramedicine Encounter    Patient ID: Marcus Tran, male    DOB: November 03, 1950, 72 y.o.   MRN: Marcus Tran   Complaints None  Assessment A&Ox4, skin W&D w/ good color.    Compliance with meds continued w/ bubble packs  Pill box filled Deconstructed bubble pack to reflect med changes x 1 week.  Refills needed New bubble packs to reflect med changes  Meds changes since last visit Discontinued Marcus Tran.  Stop Lasix x 1 week and restart 01/23/23 40mg . BID.    Social changes none   There were no vitals taken for this visit. Weight yesterday-not taken Last visit weight-149.4lb (01/06/23)  Marcus Tran recently hospitalized x 4 days due to COPD exacerbation.  He has continued to take his meds from bubble packs since discharge.  Bubble packs deconstructed today to reflect changes of discontinuing Entresto and Marcus Tran. Also Lasix removed and added back starting 01/23/23 40mg . BID.  His next bubble packs will be due for refill next week and I will work with the pharmacy to make sure med changes are reflected. Discussed him testing positive for Cocaine and encouraged him to refrain from further drug use and smoking.   I am concerned about his weight gain of 12lbs in a 2 week period.  He denies chest pain or SOB.  Lung sounds are clear bilat.  Some mild edema noted to his lower extremities but not pitting.  Will reach out for guidance from HF clinic based on weight gain.  He also needs to schedule a follow up appointment since his recent hospital admission.   ACTION: Home visit completed  Marcus Tran [id number] 01/20/23  Patient Care Team: Marcus Mile, NP as PCP - Marcus Bay, MD as PCP - Cardiology (Cardiology)  Patient Active Problem List   Diagnosis Date Noted   Cocaine use 01/15/2023   Noncompliance 01/13/2023   Adenovirus pneumonia 11/12/2022   Chronic HFrEF (heart failure with reduced ejection fraction) 08/13/2022   DM2 (diabetes mellitus, type 2)  08/13/2022   Obstructive sleep apnea (adult) (pediatric) 08/08/2022   Acute respiratory failure with hypoxia and hypercapnia 05/04/2022   Third degree heart block 04/15/2022   Snoring 04/15/2022   Nonischemic cardiomyopathy 04/15/2022   HFrEF (heart failure with reduced ejection fraction) 04/15/2022   COPD with acute exacerbation 03/22/2022   Chronic combined systolic and diastolic CHF (congestive heart failure)    Tobacco use disorder 03/08/2022   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 Swallow whole., Disp: 90 tablet, Rfl: 3   atorvastatin (LIPITOR) 80 MG tablet, Take 1 tablet (80 mg total) by mouth every evening., Disp: 90 tablet, Rfl: 3   bisoprolol (ZEBETA) 5 MG tablet, TAKE 1 Tablet BY MOUTH ONCE DAILY, Disp: 30 tablet, Rfl: 6   Budeson-Glycopyrrol-Formoterol (BREZTRI AEROSPHERE) 160-9-4.8 MCG/ACT AERO, Inhale 2 puffs into the lungs as needed., Disp: 10.7 g, Rfl: 2   FARXIGA 10 MG TABS tablet, TAKE 1 Tablet BY MOUTH ONCE DAILY, Disp: 30 tablet, Rfl: 5   [START ON 01/23/2023] furosemide (LASIX) 40 MG tablet, Take 1 tablet (40  mg total) by mouth 2 (two) times daily., Disp: 90 tablet, Rfl: 3   ipratropium-albuterol (DUONEB) 0.5-2.5 (3) MG/3ML SOLN, Inhale 3 mLs into the lungs in the morning, at noon, in the evening, and at bedtime. (Patient taking differently: Inhale 3 mLs into the lungs See admin instructions. Inhale 1 vial via nebulizer 3-4 times daily.), Disp: 360 mL, Rfl: 11   isosorbide-hydrALAZINE (BIDIL) 20-37.5 MG tablet, Take 1 tablet by mouth 3 (three) times daily., Disp: 90  tablet, Rfl: 3   ketoconazole (NIZORAL) 2 % cream, Apply 1 Application topically daily as needed for irritation., Disp: , Rfl:    metFORMIN (GLUCOPHAGE) 500 MG tablet, Take 1 tablet (500 mg total) by mouth 2 (two) times daily with a meal., Disp: 60 tablet, Rfl: 0   montelukast (SINGULAIR) 10 MG tablet, Take 10 mg by mouth at bedtime., Disp: , Rfl:    predniSONE (DELTASONE) 50 MG tablet, Take 1 tablet (50 mg total) by mouth daily for 3 days., Disp: 3 tablet, Rfl: 0 No Known Allergies   Social History   Socioeconomic History   Marital status: Single    Spouse name: Not on file   Number of children: 1   Years of education: Not on file   Highest education level: 10th grade  Occupational History   Occupation: Disability  Tobacco Use   Smoking status: Some Days    Packs/day: 0.50    Years: 50.00    Additional pack years: 0.00    Total pack years: 25.00    Types: Cigarettes    Last attempt to quit: 03/19/2022    Years since quitting: 0.8   Smokeless tobacco: Never   Tobacco comments:    Smoking cessation  Vaping Use   Vaping Use: Never used  Substance and Sexual Activity   Alcohol use: Not on file    Comment: socially   Drug use: Yes    Types: Marijuana    Comment: past   Sexual activity: Not on file  Other Topics Concern   Not on file  Social History Narrative   He lives with his daughter apparently and 5 grandchildren.  He reports that he smokes probably less than a pack of cigarettes a day.  He occasionally drinks a beer or liquor but not daily.   Social Determinants of Health   Financial Resource Strain: Low Risk  (03/11/2022)   Overall Financial Resource Strain (CARDIA)    Difficulty of Paying Living Expenses: Not very hard  Food Insecurity: No Food Insecurity (01/13/2023)   Hunger Vital Sign    Worried About Running Out of Food in the Last Year: Never true    Ran Out of Food in the Last Year: Never true  Recent Concern: Food Insecurity - Food Insecurity Present  (10/26/2022)   Hunger Vital Sign    Worried About Running Out of Food in the Last Year: Often true    Ran Out of Food in the Last Year: Often true  Transportation Needs: No Transportation Needs (01/13/2023)   PRAPARE - Hydrologist (Medical): No    Lack of Transportation (Non-Medical): No  Recent Concern: Transportation Needs - Unmet Transportation Needs (12/30/2022)   PRAPARE - Hydrologist (Medical): Yes    Lack of Transportation (Non-Medical): Yes  Physical Activity: Not on file  Stress: Not on file  Social Connections: Not on file  Intimate Partner Violence: Not At Risk (01/13/2023)   Humiliation, Afraid, Rape, and Kick questionnaire  Fear of Current or Ex-Partner: No    Emotionally Abused: No    Physically Abused: No    Sexually Abused: No    Physical Exam      No future appointments.

## 2023-01-27 ENCOUNTER — Other Ambulatory Visit (HOSPITAL_COMMUNITY): Payer: Self-pay | Admitting: Emergency Medicine

## 2023-01-27 NOTE — Progress Notes (Signed)
Paramedicine Encounter    Patient ID: Marcus Tran, male    DOB: Nov 23, 1950, 72 y.o.   MRN: [id number]   Complaints NONE  Assessment***  Compliance with meds 100%  Pill box filled n/a  Refills needed Bubble packs on Friday  Meds changes since last visit NONE    Social changes NONE   Wt 154 lb 9.6 oz (70.1 kg)   BMI 25.73 kg/m  Weight yesterday-not taken Last visit weight-***  ACTION: {Paramed Action:[phone]}  Beatrix Shipper, EMT-Paramedic [phone] 01/27/23  Patient Care Team: Hillery Aldo, NP as PCP - General Rollene Rotunda, MD as PCP - Cardiology (Cardiology)  Patient Active Problem List   Diagnosis Date Noted  . Cocaine use 01/15/2023  . Noncompliance 01/13/2023  . Adenovirus pneumonia 11/12/2022  . Chronic HFrEF (heart failure with reduced ejection fraction) 08/13/2022  . DM2 (diabetes mellitus, type 2) 08/13/2022  . Obstructive sleep apnea (adult) (pediatric) 08/08/2022  . Acute respiratory failure with hypoxia and hypercapnia 05/04/2022  . Third degree heart block 04/15/2022  . Snoring 04/15/2022  . Nonischemic cardiomyopathy 04/15/2022  . HFrEF (heart failure with reduced ejection fraction) 04/15/2022  . COPD with acute exacerbation 03/22/2022  . Chronic combined systolic and diastolic CHF (congestive heart failure)   . Tobacco use disorder 03/08/2022  . 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 HTN (hypertension) 03/08/2022  . Stage 3a chronic kidney disease (CKD) 03/08/2022  . Coronary artery disease 03/08/2022    Current Outpatient Medications:  .  albuterol (VENTOLIN HFA) 108 (90 Base) MCG/ACT inhaler, Inhale 2 puffs into the lungs every 4 (four) hours as needed for wheezing or shortness of breath., Disp: 1 each, Rfl: 2 .  aspirin EC 81 MG tablet, Take 1 tablet (81 mg total) by mouth daily. Swallow whole., Disp: 90 tablet, Rfl: 3 .  atorvastatin (LIPITOR) 80 MG tablet, Take 1 tablet (80 mg total) by mouth every evening., Disp: 90  tablet, Rfl: 3 .  bisoprolol (ZEBETA) 5 MG tablet, TAKE 1 Tablet BY MOUTH ONCE DAILY, Disp: 30 tablet, Rfl: 6 .  Budeson-Glycopyrrol-Formoterol (BREZTRI AEROSPHERE) 160-9-4.8 MCG/ACT AERO, Inhale 2 puffs into the lungs as needed., Disp: 10.7 g, Rfl: 2 .  FARXIGA 10 MG TABS tablet, TAKE 1 Tablet BY MOUTH ONCE DAILY, Disp: 30 tablet, Rfl: 5 .  furosemide (LASIX) 40 MG tablet, Take 1 tablet (40 mg total) by mouth 2 (two) times daily., Disp: 90 tablet, Rfl: 3 .  isosorbide-hydrALAZINE (BIDIL) 20-37.5 MG tablet, Take 1 tablet by mouth 3 (three) times daily., Disp: 90 tablet, Rfl: 3 .  ketoconazole (NIZORAL) 2 % cream, Apply 1 Application topically daily as needed for irritation., Disp: , Rfl:  .  metFORMIN (GLUCOPHAGE) 500 MG tablet, Take 1 tablet (500 mg total) by mouth 2 (two) times daily with a meal., Disp: 60 tablet, Rfl: 0 .  montelukast (SINGULAIR) 10 MG tablet, Take 10 mg by mouth at bedtime., Disp: , Rfl:  .  ipratropium-albuterol (DUONEB) 0.5-2.5 (3) MG/3ML SOLN, Inhale 3 mLs into the lungs in the morning, at noon, in the evening, and at bedtime. (Patient taking differently: Inhale 3 mLs into the lungs See admin instructions. Inhale 1 vial via nebulizer 3-4 times daily.), Disp: 360 mL, Rfl: 11 No Known Allergies   Social History   Socioeconomic History  . Marital status: Single    Spouse name: Not on file  . Number of children: 1  . Years of education: Not on file  . Highest education level: 10th grade  Occupational History  .  Occupation: Disability  Tobacco Use  . Smoking status: Some Days    Packs/day: 0.50    Years: 50.00    Additional pack years: 0.00    Total pack years: 25.00    Types: Cigarettes    Last attempt to quit: 03/19/2022    Years since quitting: 0.8  . Smokeless tobacco: Never  . Tobacco comments:    Smoking cessation  Vaping Use  . Vaping Use: Never used  Substance and Sexual Activity  . Alcohol use: Not on file    Comment: socially  . Drug use: Yes     Types: Marijuana    Comment: past  . Sexual activity: Not on file  Other Topics Concern  . Not on file  Social History Narrative   He lives with his daughter apparently and 5 grandchildren.  He reports that he smokes probably less than a pack of cigarettes a day.  He occasionally drinks a beer or liquor but not daily.   Social Determinants of Health   Financial Resource Strain: Low Risk  (03/11/2022)   Overall Financial Resource Strain (CARDIA)   . Difficulty of Paying Living Expenses: Not very hard  Food Insecurity: No Food Insecurity (01/13/2023)   Hunger Vital Sign   . Worried About Programme researcher, broadcasting/film/video in the Last Year: Never true   . Ran Out of Food in the Last Year: Never true  Recent Concern: Food Insecurity - Food Insecurity Present (10/26/2022)   Hunger Vital Sign   . Worried About Programme researcher, broadcasting/film/video in the Last Year: Often true   . Ran Out of Food in the Last Year: Often true  Transportation Needs: No Transportation Needs (01/13/2023)   PRAPARE - Transportation   . Lack of Transportation (Medical): No   . Lack of Transportation (Non-Medical): No  Recent Concern: Transportation Needs - Unmet Transportation Needs (12/30/2022)   PRAPARE - Transportation   . Lack of Transportation (Medical): Yes   . Lack of Transportation (Non-Medical): Yes  Physical Activity: Not on file  Stress: Not on file  Social Connections: Not on file  Intimate Partner Violence: Not At Risk (01/13/2023)   Humiliation, Afraid, Rape, and Kick questionnaire   . Fear of Current or Ex-Partner: No   . Emotionally Abused: No   . Physically Abused: No   . Sexually Abused: No    Physical Exam      No future appointments.

## 2023-01-29 ENCOUNTER — Other Ambulatory Visit (HOSPITAL_COMMUNITY): Payer: Self-pay | Admitting: Emergency Medicine

## 2023-01-29 NOTE — Progress Notes (Signed)
Dropped off new set of bubble packs for Marcus Tran and reviewed how to follow the pack dates.  He verbalizes he understands.    Beatrix Shipper, EMT-Paramedic [phone] 01/29/2023

## 2023-02-03 ENCOUNTER — Other Ambulatory Visit (HOSPITAL_COMMUNITY): Payer: Self-pay | Admitting: Emergency Medicine

## 2023-02-03 NOTE — Progress Notes (Signed)
Paramedicine Encounter    Patient ID: Marcus Tran, male    DOB: 19-Feb-1951, 72 y.o.   MRN: [id number]   Complaints NONE  Assessment A&O x 4, skin W&D w/ good color.  Lung sounds clear bilat.  No peripheral edema noted  Compliance with meds 100% on bubble packs  Pill box filled n/a  Refills needed none  Meds changes since last visit none    Social changes NONE   BP 110/70 (BP Location: Right Arm, Patient Position: Sitting, Cuff Size: Normal)   Pulse 74   Resp 14   Wt 150 lb (68 kg)   SpO2 97%   BMI 24.96 kg/m  Weight yesterday-not taken Last visit weight-151lb   ACTION: Home visit completed  Bethanie Dicker [phone] 02/03/23  Patient Care Team: Hillery Aldo, NP as PCP - Reatha Armour, MD as PCP - Cardiology (Cardiology)  Patient Active Problem List   Diagnosis Date Noted   Cocaine use 01/15/2023   Noncompliance 01/13/2023   Adenovirus pneumonia 11/12/2022   Chronic HFrEF (heart failure with reduced ejection fraction) 08/13/2022   DM2 (diabetes mellitus, type 2) 08/13/2022   Obstructive sleep apnea (adult) (pediatric) 08/08/2022   Acute respiratory failure with hypoxia and hypercapnia 05/04/2022   Third degree heart block 04/15/2022   Snoring 04/15/2022   Nonischemic cardiomyopathy 04/15/2022   HFrEF (heart failure with reduced ejection fraction) 04/15/2022   COPD with acute exacerbation 03/22/2022   Chronic combined systolic and diastolic CHF (congestive heart failure)    Tobacco use disorder 03/08/2022   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   HTN (hypertension) 03/08/2022   Stage 3a chronic kidney disease (CKD) 03/08/2022   Coronary artery disease 03/08/2022    Current Outpatient Medications:    albuterol (VENTOLIN HFA) 108 (90 Base) MCG/ACT inhaler, Inhale 2 puffs into the lungs every 4 (four) hours as needed for wheezing or shortness of breath., Disp: 1 each, Rfl: 2   aspirin EC 81 MG tablet, Take 1 tablet (81 mg  total) by mouth daily. Swallow whole., Disp: 90 tablet, Rfl: 3   atorvastatin (LIPITOR) 80 MG tablet, Take 1 tablet (80 mg total) by mouth every evening., Disp: 90 tablet, Rfl: 3   bisoprolol (ZEBETA) 5 MG tablet, TAKE 1 Tablet BY MOUTH ONCE DAILY, Disp: 30 tablet, Rfl: 6   Budeson-Glycopyrrol-Formoterol (BREZTRI AEROSPHERE) 160-9-4.8 MCG/ACT AERO, Inhale 2 puffs into the lungs as needed., Disp: 10.7 g, Rfl: 2   FARXIGA 10 MG TABS tablet, TAKE 1 Tablet BY MOUTH ONCE DAILY, Disp: 30 tablet, Rfl: 5   furosemide (LASIX) 40 MG tablet, Take 1 tablet (40 mg total) by mouth 2 (two) times daily., Disp: 90 tablet, Rfl: 3   ipratropium-albuterol (DUONEB) 0.5-2.5 (3) MG/3ML SOLN, Inhale 3 mLs into the lungs in the morning, at noon, in the evening, and at bedtime. (Patient taking differently: Inhale 3 mLs into the lungs See admin instructions. Inhale 1 vial via nebulizer 3-4 times daily.), Disp: 360 mL, Rfl: 11   isosorbide-hydrALAZINE (BIDIL) 20-37.5 MG tablet, Take 1 tablet by mouth 3 (three) times daily., Disp: 90 tablet, Rfl: 3   ketoconazole (NIZORAL) 2 % cream, Apply 1 Application topically daily as needed for irritation., Disp: , Rfl:    metFORMIN (GLUCOPHAGE) 500 MG tablet, Take 1 tablet (500 mg total) by mouth 2 (two) times daily with a meal., Disp: 60 tablet, Rfl: 0   montelukast (SINGULAIR) 10 MG tablet, Take 10 mg by mouth at bedtime., Disp: , Rfl:  No Known Allergies  Social History   Socioeconomic History   Marital status: Single    Spouse name: Not on file   Number of children: 1   Years of education: Not on file   Highest education level: 10th grade  Occupational History   Occupation: Disability  Tobacco Use   Smoking status: Some Days    Packs/day: 0.50    Years: 50.00    Additional pack years: 0.00    Total pack years: 25.00    Types: Cigarettes    Last attempt to quit: 03/19/2022    Years since quitting: 0.8   Smokeless tobacco: Never   Tobacco comments:    Smoking cessation   Vaping Use   Vaping Use: Never used  Substance and Sexual Activity   Alcohol use: Not on file    Comment: socially   Drug use: Yes    Types: Marijuana    Comment: past   Sexual activity: Not on file  Other Topics Concern   Not on file  Social History Narrative   He lives with his daughter apparently and 5 grandchildren.  He reports that he smokes probably less than a pack of cigarettes a day.  He occasionally drinks a beer or liquor but not daily.   Social Determinants of Health   Financial Resource Strain: Low Risk  (03/11/2022)   Overall Financial Resource Strain (CARDIA)    Difficulty of Paying Living Expenses: Not very hard  Food Insecurity: No Food Insecurity (01/13/2023)   Hunger Vital Sign    Worried About Running Out of Food in the Last Year: Never true    Ran Out of Food in the Last Year: Never true  Recent Concern: Food Insecurity - Food Insecurity Present (10/26/2022)   Hunger Vital Sign    Worried About Running Out of Food in the Last Year: Often true    Ran Out of Food in the Last Year: Often true  Transportation Needs: No Transportation Needs (01/13/2023)   PRAPARE - Administrator, Civil Service (Medical): No    Lack of Transportation (Non-Medical): No  Recent Concern: Transportation Needs - Unmet Transportation Needs (12/30/2022)   PRAPARE - Administrator, Civil Service (Medical): Yes    Lack of Transportation (Non-Medical): Yes  Physical Activity: Not on file  Stress: Not on file  Social Connections: Not on file  Intimate Partner Violence: Not At Risk (01/13/2023)   Humiliation, Afraid, Rape, and Kick questionnaire    Fear of Current or Ex-Partner: No    Emotionally Abused: No    Physically Abused: No    Sexually Abused: No    Physical Exam      No future appointments.

## 2023-02-10 ENCOUNTER — Other Ambulatory Visit (HOSPITAL_COMMUNITY): Payer: Self-pay | Admitting: Emergency Medicine

## 2023-02-10 NOTE — Progress Notes (Signed)
Paramedicine Encounter    Patient ID: Marcus Tran, male    DOB: 1951/05/25, 72 y.o.   MRN: [id number]   Complaints  NONE  Assessment A&O x 4, skin W&D w/ good color.  Lung sounds clear throughout.  No peripheral edema noted.  Refills needed NONE  Meds changes since last visit NONE    Social changes NONE   BP 120/70 (BP Location: Right Arm, Patient Position: Standing, Cuff Size: Normal)   Pulse 73   Resp 14   Wt 148 lb 12.8 oz (67.5 kg)   SpO2 98%   BMI 24.76 kg/m  Weight yesterday- Not taken Last visit weight-150lb  Marcus Tran has been doing well w/ bubble packs and has been 100% compliant.  He has no complaints of chest pain or SOB.   ACTION: Home visit completed  Bethanie Dicker [phone] 02/10/23  Patient Care Team: Marcus Aldo, NP as PCP - Reatha Armour, MD as PCP - Cardiology (Cardiology)  Patient Active Problem List   Diagnosis Date Noted   Cocaine use 01/15/2023   Noncompliance 01/13/2023   Adenovirus pneumonia 11/12/2022   Chronic HFrEF (heart failure with reduced ejection fraction) 08/13/2022   DM2 (diabetes mellitus, type 2) 08/13/2022   Obstructive sleep apnea (adult) (pediatric) 08/08/2022   Acute respiratory failure with hypoxia and hypercapnia 05/04/2022   Third degree heart block 04/15/2022   Snoring 04/15/2022   Nonischemic cardiomyopathy 04/15/2022   HFrEF (heart failure with reduced ejection fraction) 04/15/2022   COPD with acute exacerbation 03/22/2022   Chronic combined systolic and diastolic CHF (congestive heart failure)    Tobacco use disorder 03/08/2022   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   HTN (hypertension) 03/08/2022   Stage 3a chronic kidney disease (CKD) 03/08/2022   Coronary artery disease 03/08/2022    Current Outpatient Medications:    albuterol (VENTOLIN HFA) 108 (90 Base) MCG/ACT inhaler, Inhale 2 puffs into the lungs every 4 (four) hours as needed for wheezing or shortness of breath.,  Disp: 1 each, Rfl: 2   aspirin EC 81 MG tablet, Take 1 tablet (81 mg total) by mouth daily. Swallow whole., Disp: 90 tablet, Rfl: 3   atorvastatin (LIPITOR) 80 MG tablet, Take 1 tablet (80 mg total) by mouth every evening., Disp: 90 tablet, Rfl: 3   bisoprolol (ZEBETA) 5 MG tablet, TAKE 1 Tablet BY MOUTH ONCE DAILY, Disp: 30 tablet, Rfl: 6   Budeson-Glycopyrrol-Formoterol (BREZTRI AEROSPHERE) 160-9-4.8 MCG/ACT AERO, Inhale 2 puffs into the lungs as needed., Disp: 10.7 g, Rfl: 2   FARXIGA 10 MG TABS tablet, TAKE 1 Tablet BY MOUTH ONCE DAILY, Disp: 30 tablet, Rfl: 5   furosemide (LASIX) 40 MG tablet, Take 1 tablet (40 mg total) by mouth 2 (two) times daily., Disp: 90 tablet, Rfl: 3   ipratropium-albuterol (DUONEB) 0.5-2.5 (3) MG/3ML SOLN, Inhale 3 mLs into the lungs in the morning, at noon, in the evening, and at bedtime. (Patient taking differently: Inhale 3 mLs into the lungs See admin instructions. Inhale 1 vial via nebulizer 3-4 times daily.), Disp: 360 mL, Rfl: 11   isosorbide-hydrALAZINE (BIDIL) 20-37.5 MG tablet, Take 1 tablet by mouth 3 (three) times daily., Disp: 90 tablet, Rfl: 3   ketoconazole (NIZORAL) 2 % cream, Apply 1 Application topically daily as needed for irritation., Disp: , Rfl:    metFORMIN (GLUCOPHAGE) 500 MG tablet, Take 1 tablet (500 mg total) by mouth 2 (two) times daily with a meal., Disp: 60 tablet, Rfl: 0   montelukast (SINGULAIR) 10 MG  tablet, Take 10 mg by mouth at bedtime., Disp: , Rfl:  No Known Allergies   Social History   Socioeconomic History   Marital status: Single    Spouse name: Not on file   Number of children: 1   Years of education: Not on file   Highest education level: 10th grade  Occupational History   Occupation: Disability  Tobacco Use   Smoking status: Some Days    Packs/day: 0.50    Years: 50.00    Additional pack years: 0.00    Total pack years: 25.00    Types: Cigarettes    Last attempt to quit: 03/19/2022    Years since quitting: 0.8    Smokeless tobacco: Never   Tobacco comments:    Smoking cessation  Vaping Use   Vaping Use: Never used  Substance and Sexual Activity   Alcohol use: Not on file    Comment: socially   Drug use: Yes    Types: Marijuana    Comment: past   Sexual activity: Not on file  Other Topics Concern   Not on file  Social History Narrative   He lives with his daughter apparently and 5 grandchildren.  He reports that he smokes probably less than a pack of cigarettes a day.  He occasionally drinks a beer or liquor but not daily.   Social Determinants of Health   Financial Resource Strain: Low Risk  (03/11/2022)   Overall Financial Resource Strain (CARDIA)    Difficulty of Paying Living Expenses: Not very hard  Food Insecurity: No Food Insecurity (01/13/2023)   Hunger Vital Sign    Worried About Running Out of Food in the Last Year: Never true    Ran Out of Food in the Last Year: Never true  Recent Concern: Food Insecurity - Food Insecurity Present (10/26/2022)   Hunger Vital Sign    Worried About Running Out of Food in the Last Year: Often true    Ran Out of Food in the Last Year: Often true  Transportation Needs: No Transportation Needs (01/13/2023)   PRAPARE - Administrator, Civil Service (Medical): No    Lack of Transportation (Non-Medical): No  Recent Concern: Transportation Needs - Unmet Transportation Needs (12/30/2022)   PRAPARE - Administrator, Civil Service (Medical): Yes    Lack of Transportation (Non-Medical): Yes  Physical Activity: Not on file  Stress: Not on file  Social Connections: Not on file  Intimate Partner Violence: Not At Risk (01/13/2023)   Humiliation, Afraid, Rape, and Kick questionnaire    Fear of Current or Ex-Partner: No    Emotionally Abused: No    Physically Abused: No    Sexually Abused: No    Physical Exam      No future appointments.

## 2023-02-19 ENCOUNTER — Other Ambulatory Visit (HOSPITAL_BASED_OUTPATIENT_CLINIC_OR_DEPARTMENT_OTHER): Payer: Self-pay | Admitting: Family

## 2023-02-19 DIAGNOSIS — I5020 Unspecified systolic (congestive) heart failure: Secondary | ICD-10-CM

## 2023-02-24 ENCOUNTER — Other Ambulatory Visit (HOSPITAL_COMMUNITY): Payer: Self-pay | Admitting: Emergency Medicine

## 2023-02-24 NOTE — Progress Notes (Signed)
Paramedicine Encounter    Patient ID: Marcus Tran, male    DOB: Feb 21, 1951, 72 y.o.   MRN: [id number]   Complaints NONE  Assessment A&O x 4, skin W&D w/ good color.  Compliance with meds 100%  Pill box filled N/A He is on bubble packs  Refills needed None  Meds changes since last visit D/C Breztri and began Trelegy    Social changes NONE   BP 122/80 (BP Location: Right Arm, Patient Position: Sitting, Cuff Size: Normal)   Resp 16   Wt 148 lb 12.8 oz (67.5 kg)   SpO2 96%   BMI 24.76 kg/m  Weight yesterday- not taken Last visit weight-148lb  Picked up pt's new bubble packs from [facility] and delivered them to him for today's home visit.  Marcus Tran is doing very well with his med compliance with these new bubble packs.  Also, I reviewed with him how to properly use his inhaler by demonstrating to him with my personal inhaler and he stated he now has a better understanding. Marcus Tran needs a follow up appointment with [facility] and I will reach out tomorrow to try and get this scheduled for him.  ACTION: Home visit completed  Bethanie Dicker [phone] 02/24/23  Patient Care Team: Hillery Aldo, NP as PCP - Reatha Armour, MD as PCP - Cardiology (Cardiology)  Patient Active Problem List   Diagnosis Date Noted   Cocaine use 01/15/2023   Noncompliance 01/13/2023   Adenovirus pneumonia 11/12/2022   Chronic HFrEF (heart failure with reduced ejection fraction) (HCC) 08/13/2022   DM2 (diabetes mellitus, type 2) (HCC) 08/13/2022   Obstructive sleep apnea (adult) (pediatric) 08/08/2022   Acute respiratory failure with hypoxia and hypercapnia (HCC) 05/04/2022   Third degree heart block (HCC) 04/15/2022   Snoring 04/15/2022   Nonischemic cardiomyopathy (HCC) 04/15/2022   HFrEF (heart failure with reduced ejection fraction) (HCC) 04/15/2022   COPD with acute exacerbation (HCC) 03/22/2022   Chronic combined systolic and diastolic CHF (congestive heart  failure) (HCC)    Tobacco use disorder 03/08/2022   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 Swallow whole., Disp: 90 tablet, Rfl: 3   atorvastatin (LIPITOR) 80 MG tablet, Take 1 tablet (80 mg total) by mouth every evening., Disp: 90 tablet, Rfl: 3   bisoprolol (ZEBETA) 5 MG tablet, TAKE 1 Tablet BY MOUTH ONCE DAILY, Disp: 30 tablet, Rfl: 6   FARXIGA 10 MG TABS tablet, TAKE 1 Tablet BY MOUTH ONCE DAILY (MORNING), Disp: 30 tablet, Rfl: 5   furosemide (LASIX) 40 MG tablet, Take 1 tablet (40 mg total) by mouth 2 (two) times daily., Disp: 90 tablet, Rfl: 3   ipratropium-albuterol (DUONEB) 0.5-2.5 (3) MG/3ML SOLN, Inhale 3 mLs into the lungs in the morning, at noon, in the evening, and at bedtime. (Patient taking differently: Inhale 3 mLs into the lungs See admin instructions. Inhale 1 vial via nebulizer 3-4 times daily.), Disp: 360 mL, Rfl: 11   isosorbide-hydrALAZINE (BIDIL) 20-37.5 MG tablet, Take 1 tablet by mouth 3 (three) times daily., Disp: 90 tablet,  Rfl: 3   ketoconazole (NIZORAL) 2 % cream, Apply 1 Application topically daily as needed for irritation., Disp: , Rfl:    metFORMIN (GLUCOPHAGE) 500 MG tablet, Take 1 tablet (500 mg total) by mouth 2 (two) times daily with a meal., Disp: 60 tablet, Rfl: 0   montelukast (SINGULAIR) 10 MG tablet, Take 10 mg by mouth at bedtime., Disp: , Rfl:    Budeson-Glycopyrrol-Formoterol (BREZTRI AEROSPHERE) 160-9-4.8 MCG/ACT AERO, Inhale 2 puffs into the lungs as needed., Disp: 10.7 g, Rfl: 2 No Known Allergies   Social History   Socioeconomic  History   Marital status: Single    Spouse name: Not on file   Number of children: 1   Years of education: Not on file   Highest education level: 10th grade  Occupational History   Occupation: Disability  Tobacco Use   Smoking status: Some Days    Packs/day: 0.50    Years: 50.00    Additional pack years: 0.00    Total pack years: 25.00    Types: Cigarettes    Last attempt to quit: 03/19/2022    Years since quitting: 0.9   Smokeless tobacco: Never   Tobacco comments:    Smoking cessation  Vaping Use   Vaping Use: Never used  Substance and Sexual Activity   Alcohol use: Not on file    Comment: socially   Drug use: Yes    Types: Marijuana    Comment: past   Sexual activity: Not on file  Other Topics Concern   Not on file  Social History Narrative   He lives with his daughter apparently and 5 grandchildren.  He reports that he smokes probably less than a pack of cigarettes a day.  He occasionally drinks a beer or liquor but not daily.   Social Determinants of Health   Financial Resource Strain: Low Risk  (03/11/2022)   Overall Financial Resource Strain (CARDIA)    Difficulty of Paying Living Expenses: Not very hard  Food Insecurity: No Food Insecurity (01/13/2023)   Hunger Vital Sign    Worried About Running Out of Food in the Last Year: Never true    Ran Out of Food in the Last Year: Never true  Recent Concern: Food Insecurity - Food Insecurity Present (10/26/2022)   Hunger Vital Sign    Worried About Running Out of Food in the Last Year: Often true    Ran Out of Food in the Last Year: Often true  Transportation Needs: No Transportation Needs (01/13/2023)   PRAPARE - Administrator, Civil Service (Medical): No    Lack of Transportation (Non-Medical): No  Recent Concern: Transportation Needs - Unmet Transportation Needs (12/30/2022)   PRAPARE - Administrator, Civil Service (Medical): Yes    Lack of Transportation (Non-Medical): Yes  Physical Activity:  Not on file  Stress: Not on file  Social Connections: Not on file  Intimate Partner Violence: Not At Risk (01/13/2023)   Humiliation, Afraid, Rape, and Kick questionnaire    Fear of Current or Ex-Partner: No    Emotionally Abused: No    Physically Abused: No    Sexually Abused: No    Physical Exam      No future appointments.

## 2023-03-03 ENCOUNTER — Other Ambulatory Visit (HOSPITAL_COMMUNITY): Payer: Self-pay | Admitting: Emergency Medicine

## 2023-03-03 NOTE — Progress Notes (Signed)
Paramedicine Encounter    Patient ID: Marcus Tran, male    DOB: 20-Oct-1950, 72 y.o.   MRN: [id number]   Complaints***  Assessment***  Compliance with meds***  Pill box filled***  Refills needed***  Meds changes since last visit***    Social changes***   There were no vitals taken for this visit. Weight yesterday-not taken Last visit weight-148.lb  ACTION: {Paramed [phone]}  Beatrix Shipper, EMT-Paramedic [phone] 03/03/23  Patient Care Team: Hillery Aldo, NP as PCP - General Rollene Rotunda, MD as PCP - Cardiology (Cardiology)  Patient Active Problem List   Diagnosis Date Noted  . Cocaine use 01/15/2023  . Noncompliance 01/13/2023  . Adenovirus pneumonia 11/12/2022  . Chronic HFrEF (heart failure with reduced ejection fraction) (HCC) 08/13/2022  . DM2 (diabetes mellitus, type 2) (HCC) 08/13/2022  . Obstructive sleep apnea (adult) (pediatric) 08/08/2022  . Acute respiratory failure with hypoxia and hypercapnia (HCC) 05/04/2022  . Third degree heart block (HCC) 04/15/2022  . Snoring 04/15/2022  . Nonischemic cardiomyopathy (HCC) 04/15/2022  . HFrEF (heart failure with reduced ejection fraction) (HCC) 04/15/2022  . COPD with acute exacerbation (HCC) 03/22/2022  . Chronic combined systolic and diastolic CHF (congestive heart failure) (HCC)   . Tobacco use disorder 03/08/2022  . 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 HTN (hypertension) 03/08/2022  . Stage 3a chronic kidney disease (CKD) (HCC) 03/08/2022  . Coronary artery disease 03/08/2022    Current Outpatient Medications:  .  albuterol (VENTOLIN HFA) 108 (90 Base) MCG/ACT inhaler, Inhale 2 puffs into the lungs every 4 (four) hours as needed for wheezing or shortness of breath., Disp: 1 each, Rfl: 2 .  aspirin EC 81 MG tablet, Take 1 tablet (81 mg total) by mouth daily. Swallow whole., Disp: 90 tablet, Rfl: 3 .  atorvastatin (LIPITOR) 80 MG tablet, Take 1 tablet (80 mg total) by mouth  every evening., Disp: 90 tablet, Rfl: 3 .  bisoprolol (ZEBETA) 5 MG tablet, TAKE 1 Tablet BY MOUTH ONCE DAILY, Disp: 30 tablet, Rfl: 6 .  Budeson-Glycopyrrol-Formoterol (BREZTRI AEROSPHERE) 160-9-4.8 MCG/ACT AERO, Inhale 2 puffs into the lungs as needed., Disp: 10.7 g, Rfl: 2 .  FARXIGA 10 MG TABS tablet, TAKE 1 Tablet BY MOUTH ONCE DAILY (MORNING), Disp: 30 tablet, Rfl: 5 .  furosemide (LASIX) 40 MG tablet, Take 1 tablet (40 mg total) by mouth 2 (two) times daily., Disp: 90 tablet, Rfl: 3 .  ipratropium-albuterol (DUONEB) 0.5-2.5 (3) MG/3ML SOLN, Inhale 3 mLs into the lungs in the morning, at noon, in the evening, and at bedtime. (Patient taking differently: Inhale 3 mLs into the lungs See admin instructions. Inhale 1 vial via nebulizer 3-4 times daily.), Disp: 360 mL, Rfl: 11 .  isosorbide-hydrALAZINE (BIDIL) 20-37.5 MG tablet, Take 1 tablet by mouth 3 (three) times daily., Disp: 90 tablet, Rfl: 3 .  ketoconazole (NIZORAL) 2 % cream, Apply 1 Application topically daily as needed for irritation., Disp: , Rfl:  .  metFORMIN (GLUCOPHAGE) 500 MG tablet, Take 1 tablet (500 mg total) by mouth 2 (two) times daily with a meal., Disp: 60 tablet, Rfl: 0 .  montelukast (SINGULAIR) 10 MG tablet, Take 10 mg by mouth at bedtime., Disp: , Rfl:  No Known Allergies   Social History   Socioeconomic History  . Marital status: Single    Spouse name: Not on file  . Number of children: 1  . Years of education: Not on file  . Highest education level: 10th grade  Occupational History  . Occupation: Disability  Tobacco Use  . Smoking status: Some Days    Packs/day: 0.50    Years: 50.00    Additional pack years: 0.00    Total pack years: 25.00    Types: Cigarettes    Last attempt to quit: 03/19/2022    Years since quitting: 0.9  . Smokeless tobacco: Never  . Tobacco comments:    Smoking cessation  Vaping Use  . Vaping Use: Never used  Substance and Sexual Activity  . Alcohol use: Not on file    Comment:  socially  . Drug use: Yes    Types: Marijuana    Comment: past  . Sexual activity: Not on file  Other Topics Concern  . Not on file  Social History Narrative   He lives with his daughter apparently and 5 grandchildren.  He reports that he smokes probably less than a pack of cigarettes a day.  He occasionally drinks a beer or liquor but not daily.   Social Determinants of Health   Financial Resource Strain: Low Risk  (03/11/2022)   Overall Financial Resource Strain (CARDIA)   . Difficulty of Paying Living Expenses: Not very hard  Food Insecurity: No Food Insecurity (01/13/2023)   Hunger Vital Sign   . Worried About Programme researcher, broadcasting/film/video in the Last Year: Never true   . Ran Out of Food in the Last Year: Never true  Recent Concern: Food Insecurity - Food Insecurity Present (10/26/2022)   Hunger Vital Sign   . Worried About Programme researcher, broadcasting/film/video in the Last Year: Often true   . Ran Out of Food in the Last Year: Often true  Transportation Needs: No Transportation Needs (01/13/2023)   PRAPARE - Transportation   . Lack of Transportation (Medical): No   . Lack of Transportation (Non-Medical): No  Recent Concern: Transportation Needs - Unmet Transportation Needs (12/30/2022)   PRAPARE - Transportation   . Lack of Transportation (Medical): Yes   . Lack of Transportation (Non-Medical): Yes  Physical Activity: Not on file  Stress: Not on file  Social Connections: Not on file  Intimate Partner Violence: Not At Risk (01/13/2023)   Humiliation, Afraid, Rape, and Kick questionnaire   . Fear of Current or Ex-Partner: No   . Emotionally Abused: No   . Physically Abused: No   . Sexually Abused: No    Physical Exam      No future appointments.

## 2023-03-09 ENCOUNTER — Telehealth (HOSPITAL_COMMUNITY): Payer: Self-pay | Admitting: Emergency Medicine

## 2023-03-09 NOTE — Telephone Encounter (Signed)
Called Marcus Tran to let him know social worker will be calling him in the next half hour to assist him with renewing his medicaid so be sure to answer the phone.  It expires 03/19/23.  He advised he would be waiting for the call.    Beatrix Shipper, EMT-Paramedic [phone] 03/09/2023

## 2023-03-09 NOTE — Telephone Encounter (Signed)
Called to schedule transportation for Mr. Lou and was told that his medicaid will expire at the end of this month.  Someone with [facility] is to call him to assist him in renewing his Medicaid.    Beatrix Shipper, EMT-Paramedic [phone] 03/09/2023

## 2023-03-10 ENCOUNTER — Other Ambulatory Visit (HOSPITAL_COMMUNITY): Payer: Self-pay | Admitting: Emergency Medicine

## 2023-03-10 NOTE — Progress Notes (Signed)
Medication drop off only today.  Pt was going out of town this evening and needed more Ipratromprium for his nebulizer.   Will do home visit next Wednesday.    Beatrix Shipper, EMT-Paramedic [phone] 03/10/2023

## 2023-03-16 NOTE — Progress Notes (Signed)
ADVANCED HF CLINIC NOTE  PCP: Hillery Aldo, NP [facility] PCP  Primary Cardiologist: Rollene Rotunda, MD  [facility]: Dr. Shirlee Latch   HPI: 72 y.o. [name] with hx tobacco use, COPD, OSA on Bipap, HTN, and chronic HFmEF.   Admitted 03/06/2022 with acute hypoxic respiratory failure secondary to suspected COPD and acute systolic CHF.  Initially in respiratory distress and required BiPAP. BP significantly elevated at 189/133, remained elevated throughout much of admission but improved with titration of GDMT. CTA chest negative for PE. Echo EF 35%, RV okay, RVSP 54 mmhg. R/LHC: nonobstructive CAD, RA mean 12, PA 54/29 (37), PCWP mean 23, LVEDP 22 mmHg, Fick CO/CI 3.84/2.12. He diuresed with IV lasix. Discharged home on 03/11/22, weight 05/23 163 lb.    He was readmitted 06/02-06/04/23 with acute on chronic respiratory failure with hypoxia secondary to acute COPD exacerbation. Also thought to possibly have some component of a/c CHF. He diuresed with IV lasix and received doxycycline + prednisone. Discharge weight 151 lb.    Seen in [facility] clinic on 03/23/2022 . Volume looked good. Added entresto and cut back lasix. He was referred back to Cardiology. 14 day zio ordered at cardiology f/u - rhythm was sinus with short runs NSVT and SVT, bradyarrhythmias, idioventricular rhythm, with episodes of third-degree HB noted during sleep hours.   Saw Dr. Antoine Poche for f/u 04/16/22. Bradycardia thought to be possibly d/t untreated OSA. Sleep evaluation recommended before considering a device. Sleep study ordered. He was having more shortness of breath and cough. Volume looked good. He was referred to pulmonary and was treated the same day for suspected COPD exacerbation. Pulmonary ordered PFTs.   Admitted 7/23 with acute respiratory failure with hypoxia. O2 sats 80s. He initially required BiPAP. Hypertensive with SBP 210/120 and placed on nitro gtt. He was admitted for AECOPD and a/c CHF.  Diuresed with IV lasix. Cardiology  consulted and felt presentation more likely consistent with AECOPD rather than CHF. Had runs of NSVT up to 20 beats in duration.    Referred to [facility] clinic for post hospital. Was fitted for LifeVest and GDMT adjusted. Referred to the AHF clinic for further management.   Echo 8/23 EF improved 40-45%  with LVH. Recommended PYP/ CMRI  Sleep study 9/23 showed severe OSA with AHI of 42.6/hr. He declined in lab BiPap titration.  ED evaluation 10/16/22 for syncope. Low suspicion for PE. CXR ok. COVID negative. K 5.5. Suspected vasovagal episode during coughing fit. He instructed to take OTC for URI.   Follow up 10/26/22, NYHA III and volume stable. Spiro/KCL stopped with recent hyperkalemia.   Admitted 1/24 for AECOPD. Transiently required BiPap, then able to wean to nasal cannula. Found to have AKI and GDMT initially held then restarted per home regimen. Started on prednisone taper. He was discharged home, weight 155 lbs.  Follow up 2/24, stable NYHA I-II, volume OK. PYP arranged to rule out cardiac amyloid.  Admitted 3/24 with COPD exacerbation. Required BiPap and steroids. Started on IV Solu-Medrol. UDS + cocaine. Cleda Daub and Sherryll Burger stopped with AKI.    Today he returns for HF follow up with [facility] (paramedicine). Overall feeling fine. He has SOB walking up hills but otherwise no issues. Smokes 2-5 cigarettes/day. Denies palpitations, CP, dizziness, edema, or PND/Orthopnea. Appetite ok. No fever or chills. Weight at home 148 pounds. Taking all medications. Drinks an occasional beer, last used cocaine 3/24. Previously worked jobs with Nurse, mental health. Lives with daughter and grandchildren, does cooking, housework and yard work.  ECG (personally reviewed): SR rBBB, 60 bpm  Labs (3/24): K 4.1, creatinine 1.74  Cardiac Studies  - Echo (8/23): EF 40-45% w/ LVH  - R/LHC (5/23): R/LHC: nonobstructive CAD, RA mean 12, PA 54/29 (37), PCWP mean 23, LVEDP 22 mmHg, Fick CO/CI 3.84/2.12.  - Echo  (5/23): EF 35%, RV okay, RVSP 54 mmhg.  Past Medical History:  Diagnosis Date   Acute metabolic encephalopathy 03/08/2022   Acute respiratory failure with hypoxia (HCC) 03/20/2022   Acute respiratory failure with hypoxia and hypercapnia (HCC) 03/06/2022   CHF (congestive heart failure) (HCC)    Dyslipidemia    Hypertension    Current Outpatient Medications  Medication Sig Dispense Refill   albuterol (VENTOLIN HFA) 108 (90 Base) MCG/ACT inhaler Inhale 2 puffs into the lungs every 4 (four) hours as needed for wheezing or shortness of breath. 1 each 2   aspirin EC 81 MG tablet Take 1 tablet (81 mg total) by mouth daily. Swallow whole. 90 tablet 3   atorvastatin (LIPITOR) 80 MG tablet Take 1 tablet (80 mg total) by mouth every evening. 90 tablet 3   bisoprolol (ZEBETA) 5 MG tablet TAKE 1 Tablet BY MOUTH ONCE DAILY 30 tablet 6   FARXIGA 10 MG TABS tablet TAKE 1 Tablet BY MOUTH ONCE DAILY (MORNING) 30 tablet 5   furosemide (LASIX) 40 MG tablet Take 1 tablet (40 mg total) by mouth 2 (two) times daily. 90 tablet 3   ipratropium-albuterol (DUONEB) 0.5-2.5 (3) MG/3ML SOLN Inhale 3 mLs into the lungs in the morning, at noon, in the evening, and at bedtime. (Patient taking differently: Inhale 3 mLs into the lungs See admin instructions. Inhale 1 vial via nebulizer 3-4 times daily as needed.) 360 mL 11   isosorbide-hydrALAZINE (BIDIL) 20-37.5 MG tablet Take 1 tablet by mouth 3 (three) times daily. 90 tablet 3   ketoconazole (NIZORAL) 2 % cream Apply 1 Application topically daily as needed for irritation.     metFORMIN (GLUCOPHAGE) 500 MG tablet Take 1 tablet (500 mg total) by mouth 2 (two) times daily with a meal. 60 tablet 0   montelukast (SINGULAIR) 10 MG tablet Take 10 mg by mouth at bedtime.     TRELEGY ELLIPTA 100-62.5-25 MCG/ACT AEPB Take 1 puff by mouth daily.     No current facility-administered medications for this encounter.   No Known Allergies  Social History   Socioeconomic History    Marital status: Single    Spouse name: Not on file   Number of children: 1   Years of education: Not on file   Highest education level: 10th grade  Occupational History   Occupation: Disability  Tobacco Use   Smoking status: Some Days    Packs/day: 0.50    Years: 50.00    Additional pack years: 0.00    Total pack years: 25.00    Types: Cigarettes    Last attempt to quit: 03/19/2022    Years since quitting: 0.9   Smokeless tobacco: Never   Tobacco comments:    Smoking cessation  Vaping Use   Vaping Use: Never used  Substance and Sexual Activity   Alcohol use: Not on file    Comment: socially   Drug use: Yes    Types: Marijuana    Comment: past   Sexual activity: Not on file  Other Topics Concern   Not on file  Social History Narrative   He lives with his daughter apparently and 5 grandchildren.  He reports that he smokes probably less  than a pack of cigarettes a day.  He occasionally drinks a beer or liquor but not daily.   Social Determinants of Health   Financial Resource Strain: Low Risk  (03/11/2022)   Overall Financial Resource Strain (CARDIA)    Difficulty of Paying Living Expenses: Not very hard  Food Insecurity: No Food Insecurity (01/13/2023)   Hunger Vital Sign    Worried About Running Out of Food in the Last Year: Never true    Ran Out of Food in the Last Year: Never true  Recent Concern: Food Insecurity - Food Insecurity Present (10/26/2022)   Hunger Vital Sign    Worried About Running Out of Food in the Last Year: Often true    Ran Out of Food in the Last Year: Often true  Transportation Needs: No Transportation Needs (01/13/2023)   PRAPARE - Administrator, Civil Service (Medical): No    Lack of Transportation (Non-Medical): No  Recent Concern: Transportation Needs - Unmet Transportation Needs (12/30/2022)   PRAPARE - Administrator, Civil Service (Medical): Yes    Lack of Transportation (Non-Medical): Yes  Physical Activity: Not on  file  Stress: Not on file  Social Connections: Not on file  Intimate Partner Violence: Not At Risk (01/13/2023)   Humiliation, Afraid, Rape, and Kick questionnaire    Fear of Current or Ex-Partner: No    Emotionally Abused: No    Physically Abused: No    Sexually Abused: No   Family History  Problem Relation Age of Onset   Hypertension Mother    BP 130/84   Pulse 67   Wt 70 kg (154 lb 6.4 oz)   SpO2 94%   BMI 25.69 kg/m   Wt Readings from Last 3 Encounters:  03/18/23 70 kg (154 lb 6.4 oz)  03/17/23 67.9 kg (149 lb 12.8 oz)  03/03/23 68.6 kg (151 lb 3.2 oz)   PHYSICAL EXAM: General:  NAD. No resp difficulty, walked into clinic, thin HEENT: Normal Neck: Supple. No JVD. Carotids 2+ bilat; no bruits. No lymphadenopathy or thryomegaly appreciated. Cor: PMI nondisplaced. Regular rate & rhythm. No rubs, gallops or murmurs. Lungs: Diminished BS bases Abdomen: Soft, nontender, nondistended. No hepatosplenomegaly. No bruits or masses. Good bowel sounds. Extremities: No cyanosis, clubbing, rash, edema Neuro: Alert & oriented x 3, cranial nerves grossly intact. Moves all 4 extremities w/o difficulty. Affect pleasant.  ASSESSMENT & PLAN:  Chronic Systolic Heart Failure/ NICM: - Echo 5/23: LVEF 35%, RV okay - R/LHC 5/23: Nonobstructive CAD, RA mean 12, PA 54/29 (37), PCWP mean 23, LVEDP 22 mmHg - Echo (8/23): EF improved 40-45%. Out of window for ICD.  - Has LVH. PYP arranged. - Improved NYHA II, HF confounded by COPD. Volume stable today.  - Restart Entresto 24/26 mg bid. BMET today, repeat in 10 days. - Plan to add back spiro next visit. - Continue Lasix 40 mg bid for now, plan to pull back at next visit.  - Continue Farxiga 10 mg daily. No GU symptoms. - Continue bisoprolol 5 mg daily.  - Continue BiDil 1 tab tid. - Repeat echo after back on GDMT to ensure EF stable.  2. Syncope: - Multiple recent episodes in the past. Evaluated in the ED 12/23. Event felt to by vasovagal.  -  2 week Zio (6/23) showed mostly NSR, significant bradycardia during sleeping hours w/ intermittent CHB - No further issues.  - Has had sleep study (see below)   3. HTN: - BP stable. -  GDMT as above  4. OSA: Severe by sleep study. Needs BiPap titration. Has been hesitant to schedule this but now willing. Will reach out to Dr. Norris Cross office to arrange.   5. CAD: - Nonobstructive on LHC 5/23 - No chest pain.  - Continue aspirin and statin.   6. Tobacco use/suspected COPD: - Discussed cessation.  - On Trelegy - Followed by Pulmonary  7. HLD: - On atorvastatin - Management per Cardiology or PCP   8. DM II: - A1c 6.8% in 1/24 - Continue Farxiga  9. SDOH - Difficulty paying for food and transportation.  - HFSW helping with resources. - ? if Managed Medicaid Team for chronic disease management is an option.  - For now, continue HF Paramedicine.   Follow up 4 weeks with PharmD (add spiro and decrease Lasix to 40 daily) and 12 weeks with Dr. Shirlee Latch + update echo. Will arrange PYP scan.  Anderson Malta Bolivar, FNP-BC  03/18/23

## 2023-03-17 ENCOUNTER — Other Ambulatory Visit (HOSPITAL_COMMUNITY): Payer: Self-pay | Admitting: Emergency Medicine

## 2023-03-17 ENCOUNTER — Telehealth (HOSPITAL_COMMUNITY): Payer: Self-pay

## 2023-03-17 ENCOUNTER — Telehealth (HOSPITAL_COMMUNITY): Payer: Self-pay | Admitting: Licensed Clinical Social Worker

## 2023-03-17 ENCOUNTER — Telehealth (HOSPITAL_COMMUNITY): Payer: Self-pay | Admitting: Emergency Medicine

## 2023-03-17 NOTE — Progress Notes (Signed)
Paramedicine Encounter    Patient ID: Marcus Tran, male    DOB: 08-10-1951, 72 y.o.   MRN: [id number]   Complaints NONE  AssessmentA&O x 4, skin W&D w/ good color.  Lung sounds clear and no peripheral edema noted.  Compliance with meds Continues to do well with his bubble packs  Pill box filled N/A  Refills needed Trelegy - will be delivered Friday  Meds changes since last visit NONE    Social changes None   BP (!) 140/90 (BP Location: Right Arm, Patient Position: Sitting, Cuff Size: Normal)   Pulse 76   Resp 16   Wt 149 lb 12.8 oz (67.9 kg)   SpO2 96%   BMI 24.93 kg/m  Weight yesterday-not taken Last visit weight-151lb  Home visit finds Marcus Tran doing well.  He has no complaints of chest pain, SOB, no edema, and no dizziness.  He has an appointment at the clinic tomorrow at 12.  Will confirm with Eileen Stanford that he has transportation.   ACTION: Home visit completed  Bethanie Dicker [phone] 03/17/23  Patient Care Team: Hillery Aldo, NP as PCP - Reatha Armour, MD as PCP - Cardiology (Cardiology)  Patient Active Problem List   Diagnosis Date Noted   Cocaine use 01/15/2023   Noncompliance 01/13/2023   Adenovirus pneumonia 11/12/2022   Chronic HFrEF (heart failure with reduced ejection fraction) (HCC) 08/13/2022   DM2 (diabetes mellitus, type 2) (HCC) 08/13/2022   Obstructive sleep apnea (adult) (pediatric) 08/08/2022   Acute respiratory failure with hypoxia and hypercapnia (HCC) 05/04/2022   Third degree heart block (HCC) 04/15/2022   Snoring 04/15/2022   Nonischemic cardiomyopathy (HCC) 04/15/2022   HFrEF (heart failure with reduced ejection fraction) (HCC) 04/15/2022   COPD with acute exacerbation (HCC) 03/22/2022   Chronic combined systolic and diastolic CHF (congestive heart failure) (HCC)    Tobacco use disorder 03/08/2022   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   HTN (hypertension) 03/08/2022   Stage 3a chronic kidney  disease (CKD) (HCC) 03/08/2022   Coronary artery disease 03/08/2022    Current Outpatient Medications:    albuterol (VENTOLIN HFA) 108 (90 Base) MCG/ACT inhaler, Inhale 2 puffs into the lungs every 4 (four) hours as needed for wheezing or shortness of breath., Disp: 1 each, Rfl: 2   aspirin EC 81 MG tablet, Take 1 tablet (81 mg total) by mouth daily. Swallow whole., Disp: 90 tablet, Rfl: 3   atorvastatin (LIPITOR) 80 MG tablet, Take 1 tablet (80 mg total) by mouth every evening., Disp: 90 tablet, Rfl: 3   bisoprolol (ZEBETA) 5 MG tablet, TAKE 1 Tablet BY MOUTH ONCE DAILY, Disp: 30 tablet, Rfl: 6   Budeson-Glycopyrrol-Formoterol (BREZTRI AEROSPHERE) 160-9-4.8 MCG/ACT AERO, Inhale 2 puffs into the lungs as needed., Disp: 10.7 g, Rfl: 2   FARXIGA 10 MG TABS tablet, TAKE 1 Tablet BY MOUTH ONCE DAILY (MORNING), Disp: 30 tablet, Rfl: 5   furosemide (LASIX) 40 MG tablet, Take 1 tablet (40 mg total) by mouth 2 (two) times daily., Disp: 90 tablet, Rfl: 3   ipratropium-albuterol (DUONEB) 0.5-2.5 (3) MG/3ML SOLN, Inhale 3 mLs into the lungs in the morning, at noon, in the evening, and at bedtime. (Patient taking differently: Inhale 3 mLs into the lungs See admin instructions. Inhale 1 vial via nebulizer 3-4 times daily.), Disp: 360 mL, Rfl: 11   isosorbide-hydrALAZINE (BIDIL) 20-37.5 MG tablet, Take 1 tablet by mouth 3 (three) times daily., Disp: 90 tablet, Rfl: 3   ketoconazole (NIZORAL) 2 % cream,  Apply 1 Application topically daily as needed for irritation., Disp: , Rfl:    metFORMIN (GLUCOPHAGE) 500 MG tablet, Take 1 tablet (500 mg total) by mouth 2 (two) times daily with a meal., Disp: 60 tablet, Rfl: 0   montelukast (SINGULAIR) 10 MG tablet, Take 10 mg by mouth at bedtime., Disp: , Rfl:  No Known Allergies   Social History   Socioeconomic History   Marital status: Single    Spouse name: Not on file   Number of children: 1   Years of education: Not on file   Highest education level: 10th grade   Occupational History   Occupation: Disability  Tobacco Use   Smoking status: Some Days    Packs/day: 0.50    Years: 50.00    Additional pack years: 0.00    Total pack years: 25.00    Types: Cigarettes    Last attempt to quit: 03/19/2022    Years since quitting: 0.9   Smokeless tobacco: Never   Tobacco comments:    Smoking cessation  Vaping Use   Vaping Use: Never used  Substance and Sexual Activity   Alcohol use: Not on file    Comment: socially   Drug use: Yes    Types: Marijuana    Comment: past   Sexual activity: Not on file  Other Topics Concern   Not on file  Social History Narrative   He lives with his daughter apparently and 5 grandchildren.  He reports that he smokes probably less than a pack of cigarettes a day.  He occasionally drinks a beer or liquor but not daily.   Social Determinants of Health   Financial Resource Strain: Low Risk  (03/11/2022)   Overall Financial Resource Strain (CARDIA)    Difficulty of Paying Living Expenses: Not very hard  Food Insecurity: No Food Insecurity (01/13/2023)   Hunger Vital Sign    Worried About Running Out of Food in the Last Year: Never true    Ran Out of Food in the Last Year: Never true  Recent Concern: Food Insecurity - Food Insecurity Present (10/26/2022)   Hunger Vital Sign    Worried About Running Out of Food in the Last Year: Often true    Ran Out of Food in the Last Year: Often true  Transportation Needs: No Transportation Needs (01/13/2023)   PRAPARE - Administrator, Civil Service (Medical): No    Lack of Transportation (Non-Medical): No  Recent Concern: Transportation Needs - Unmet Transportation Needs (12/30/2022)   PRAPARE - Administrator, Civil Service (Medical): Yes    Lack of Transportation (Non-Medical): Yes  Physical Activity: Not on file  Stress: Not on file  Social Connections: Not on file  Intimate Partner Violence: Not At Risk (01/13/2023)   Humiliation, Afraid, Rape, and Kick  questionnaire    Fear of Current or Ex-Partner: No    Emotionally Abused: No    Physically Abused: No    Sexually Abused: No    Physical Exam      Future Appointments  Date Time Provider Department Center  03/18/2023 12:00 PM [facility] PA/NP [facility] None

## 2023-03-17 NOTE — Telephone Encounter (Signed)
Able to reach daughter of patient to advise him that he has transportation scheduled to pick him up tomorrow @ 11:15.  She relayed this information to [name] and he confirmed this is good with him.    Beatrix Shipper, EMT-Paramedic [phone] 03/17/2023

## 2023-03-17 NOTE — Telephone Encounter (Signed)
H&V Care Navigation CSW Progress Note  Paramedic able to get pt on phone and confirm pick up for appt tomorrow.  Taxi set to pick pt up at 11:15am tomorrow.   SDOH Screenings   Food Insecurity: No Food Insecurity (01/13/2023)  Recent Concern: Food Insecurity - Food Insecurity Present (10/26/2022)  Housing: Low Risk  (01/13/2023)  Transportation Needs: No Transportation Needs (01/13/2023)  Recent Concern: Transportation Needs - Unmet Transportation Needs (12/30/2022)  Utilities: Not At Risk (01/13/2023)  Alcohol Screen: Low Risk  (05/06/2022)  Financial Resource Strain: Low Risk  (03/11/2022)  Tobacco Use: High Risk (01/12/2023)   03/17/2023  Marcus Tran DOB: 1951-03-24 MRN: [id number]   RIDER WAIVER AND RELEASE OF LIABILITY  For the purposes of helping with transportation needs, [place] [facility] partners with outside transportation providers (taxi companies, [place], Catering manager.) to give [name] patients or other approved people the choice of on-demand rides [name]") to our buildings for non-emergency visits.  By using [employer], I, the person signing this document, on behalf of myself and/or any legal minors (in my care using the [employer]), agree:  Science writer given to me are supplied by independent, outside transportation providers who do not work for, or have any affiliation with, [name]. [place] is not a transportation company. [place] has no control over the quality or safety of the rides I get using [employer]. [place] has no control over whether any outside ride will happen on time or not. [place] gives no guarantee on the reliability, quality, safety, or availability on any rides, or that no mistakes will happen. I know and accept that traveling by vehicle (car, truck, SVU, Zenaida Niece, bus, taxi, etc.) has risks of serious injuries such as disability, being paralyzed, and death. I know and agree the risk of using [employer] is  mine alone, and not [employer]. Transport Services are provided "as is" and as are available. The transportation providers are in charge for all inspections and care of the vehicles used to provide these rides. I agree not to take legal action against [place], its agents, employees, officers, directors, representatives, insurers, attorneys, assigns, successors, subsidiaries, and affiliates at any time for any reasons related directly or indirectly to using [employer]. I also agree not to take legal action against [place] or its affiliates for any injury, death, or damage to property caused by or related to using [employer]. I have read this Waiver and Release of Liability, and I understand the terms used in it and their legal meaning. This Waiver is freely and voluntarily given with the understanding that my right (or any legal minors) to legal action against [place] relating to [employer] is knowingly given up to use these services.   I attest that I read the Ride Waiver and Release of Liability to Marcus Tran, gave Marcus Tran the opportunity to ask questions and answered the questions asked (if any). I affirm that Marcus Tran then provided consent for assistance with transportation.     Burna Sis

## 2023-03-17 NOTE — Telephone Encounter (Signed)
Called and left patient a voice message to confirm/remind patient of their appointment at the [facility] on 03/18/23.   A message was also left to bring in all medications and/or complete list.  .

## 2023-03-17 NOTE — Telephone Encounter (Signed)
H&V Care Navigation CSW Progress Note  Clinical Social Worker consulted to help with transportation to appt tomorrow.  Unable to reach- left VM requesting return call   SDOH Screenings   Food Insecurity: No Food Insecurity (01/13/2023)  Recent Concern: Food Insecurity - Food Insecurity Present (10/26/2022)  Housing: Low Risk  (01/13/2023)  Transportation Needs: No Transportation Needs (01/13/2023)  Recent Concern: Transportation Needs - Unmet Transportation Needs (12/30/2022)  Utilities: Not At Risk (01/13/2023)  Alcohol Screen: Low Risk  (05/06/2022)  Financial Resource Strain: Low Risk  (03/11/2022)  Tobacco Use: High Risk (01/12/2023)    Burna Sis, LCSW Clinical Social Worker [facility] Desk#: [phone] Cell#: [phone]

## 2023-03-18 ENCOUNTER — Ambulatory Visit (HOSPITAL_COMMUNITY)
Admission: RE | Admit: 2023-03-18 | Discharge: 2023-03-18 | Disposition: A | Discharge status: Home / Self Care | Payer: 59 | Source: Ambulatory Visit | Attending: Family Medicine | Admitting: Family Medicine

## 2023-03-18 ENCOUNTER — Telehealth (HOSPITAL_COMMUNITY): Payer: Self-pay | Admitting: Emergency Medicine

## 2023-03-18 ENCOUNTER — Other Ambulatory Visit (HOSPITAL_COMMUNITY): Payer: Self-pay | Admitting: Emergency Medicine

## 2023-03-18 ENCOUNTER — Encounter (HOSPITAL_COMMUNITY): Payer: Self-pay

## 2023-03-18 VITALS — BP 130/84 | HR 67 | Wt 154.4 lb

## 2023-03-18 DIAGNOSIS — Z79899 Other long term (current) drug therapy: Secondary | ICD-10-CM | POA: Diagnosis not present

## 2023-03-18 DIAGNOSIS — R0602 Shortness of breath: Secondary | ICD-10-CM | POA: Diagnosis present

## 2023-03-18 DIAGNOSIS — G4733 Obstructive sleep apnea (adult) (pediatric): Secondary | ICD-10-CM

## 2023-03-18 DIAGNOSIS — I2510 Atherosclerotic heart disease of native coronary artery without angina pectoris: Secondary | ICD-10-CM | POA: Insufficient documentation

## 2023-03-18 DIAGNOSIS — E119 Type 2 diabetes mellitus without complications: Secondary | ICD-10-CM | POA: Insufficient documentation

## 2023-03-18 DIAGNOSIS — Z7982 Long term (current) use of aspirin: Secondary | ICD-10-CM | POA: Insufficient documentation

## 2023-03-18 DIAGNOSIS — I110 Hypertensive heart disease with heart failure: Secondary | ICD-10-CM | POA: Diagnosis not present

## 2023-03-18 DIAGNOSIS — E782 Mixed hyperlipidemia: Secondary | ICD-10-CM

## 2023-03-18 DIAGNOSIS — Z5982 Transportation insecurity: Secondary | ICD-10-CM | POA: Insufficient documentation

## 2023-03-18 DIAGNOSIS — I5022 Chronic systolic (congestive) heart failure: Secondary | ICD-10-CM | POA: Diagnosis not present

## 2023-03-18 DIAGNOSIS — R55 Syncope and collapse: Secondary | ICD-10-CM | POA: Insufficient documentation

## 2023-03-18 DIAGNOSIS — Z7984 Long term (current) use of oral hypoglycemic drugs: Secondary | ICD-10-CM | POA: Diagnosis not present

## 2023-03-18 DIAGNOSIS — E118 Type 2 diabetes mellitus with unspecified complications: Secondary | ICD-10-CM

## 2023-03-18 DIAGNOSIS — F17210 Nicotine dependence, cigarettes, uncomplicated: Secondary | ICD-10-CM | POA: Insufficient documentation

## 2023-03-18 DIAGNOSIS — I428 Other cardiomyopathies: Secondary | ICD-10-CM | POA: Insufficient documentation

## 2023-03-18 DIAGNOSIS — Z720 Tobacco use: Secondary | ICD-10-CM

## 2023-03-18 DIAGNOSIS — I10 Essential (primary) hypertension: Secondary | ICD-10-CM | POA: Diagnosis not present

## 2023-03-18 DIAGNOSIS — E785 Hyperlipidemia, unspecified: Secondary | ICD-10-CM | POA: Diagnosis not present

## 2023-03-18 DIAGNOSIS — Z139 Encounter for screening, unspecified: Secondary | ICD-10-CM

## 2023-03-18 LAB — BASIC METABOLIC PANEL
Anion gap: 10 (ref 5–15)
BUN: 25 mg/dL — ABNORMAL HIGH (ref 8–23)
CO2: 24 mmol/L (ref 22–32)
Calcium: 8.8 mg/dL — ABNORMAL LOW (ref 8.9–10.3)
Chloride: 100 mmol/L (ref 98–111)
Creatinine, Ser: 1.62 mg/dL — ABNORMAL HIGH (ref 0.61–1.24)
GFR, Estimated: 45 mL/min — ABNORMAL LOW (ref >60)
Glucose, Bld: 95 mg/dL (ref 70–99)
Potassium: 3.7 mmol/L (ref 3.5–5.1)
Sodium: 134 mmol/L — ABNORMAL LOW (ref 135–145)

## 2023-03-18 MED ORDER — ENTRESTO 24-26 MG PO TABS: 1.0000 | ORAL_TABLET | Freq: Two times a day (BID) | ORAL | 11 refills | Status: DC

## 2023-03-18 NOTE — Telephone Encounter (Signed)
Spoke with Mr. Rossiter to see if he had Entresto 24-26mg  He advised he only has 97-103.  Will need to get new prescription for correct dose.    Beatrix Shipper, EMT-Paramedic [phone] 03/18/2023

## 2023-03-18 NOTE — Progress Notes (Signed)
Picked up [employer]  from [facility]  Only got enough pills to last him through the end of his current bubble packs.  At refill they will be added 1 tablet AM/PM.  Will deliver  pills to [name] tomorrow 5/31.    Beatrix Shipper, EMT-Paramedic [phone] 03/18/2023

## 2023-03-18 NOTE — Progress Notes (Signed)
Paramedicine Encounter   Patient ID: Marcus Tran , male,   DOB: 1951-03-02,71 y.o.,  MRN: [id number]   Met patient in clinic today with provider.  Time spent with patient 35 minutes  Clinic visit with provider [facility].  Pt reports to be feeling well.  He has been doing well with medication compliance w/ bubble packs.  Pt restarted on Entresto 24-26  Also referring pt to Dr. Mayford Knife regarding possible repeat sleep study and BiPap or Cpap. Provider also looking into PYP Scan to screen for Amyloidosis.    Beatrix Shipper, EMT-Paramedic  [phone] 03/18/2023

## 2023-03-18 NOTE — Patient Instructions (Addendum)
Thank you for coming in today  If you had labs drawn today, any labs that are abnormal the clinic will call you No news is good news  Medications: RESTART Entresto 24/26 mg 1 tablet twice daily   Follow up appointments:  Your physician recommends that you return for lab work in: 10 days for BMET  Your physician recommends that you schedule a follow-up appointment in:  3 months with echocardiogram With Dr. Shirlee Latch  Your physician has requested that you have an echocardiogram. Echocardiography is a painless test that uses sound waves to create images of your heart. It provides your doctor with information about the size and shape of your heart and how well your heart's chambers and valves are working. This procedure takes approximately one hour. There are no restrictions for this procedure.        Do the following things EVERYDAY: Weigh yourself in the morning before breakfast. Write it down and keep it in a log. Take your medicines as prescribed Eat low salt foods--Limit salt (sodium) to 2000 mg per day.  Stay as active as you can everyday Limit all fluids for the day to less than 2 liters   At the [facility], you and your health needs are our priority. As part of our continuing mission to provide you with exceptional heart care, we have created designated Provider Care Teams. These Care Teams include your primary Cardiologist (physician) and Advanced Practice Providers (APPs- Physician Assistants and Nurse Practitioners) who all work together to provide you with the care you need, when you need it.   You may see any of the following providers on your designated Care Team at your next follow up: Dr Arvilla Meres Dr Marca Ancona Dr. Marcos Eke, NP Robbie Lis, [place] [facility] [place], [place] Brynda Peon, NP Karle Plumber, PharmD   Please be sure to bring in all your medications bottles to every appointment.    Thank you for  choosing [facility]  If you have any questions or concerns before your next appointment please send [place] a message through [place] or call our office at [phone].    TO LEAVE A MESSAGE FOR THE NURSE SELECT OPTION 2, PLEASE LEAVE A MESSAGE INCLUDING: YOUR NAME DATE OF BIRTH CALL BACK NUMBER REASON FOR CALL**this is important as we prioritize the call backs  YOU WILL RECEIVE A CALL BACK THE SAME DAY AS LONG AS YOU CALL BEFORE 4:00 PM

## 2023-03-22 ENCOUNTER — Telehealth: Payer: Self-pay | Admitting: *Deleted

## 2023-03-22 DIAGNOSIS — G4733 Obstructive sleep apnea (adult) (pediatric): Secondary | ICD-10-CM

## 2023-03-22 DIAGNOSIS — I2510 Atherosclerotic heart disease of native coronary artery without angina pectoris: Secondary | ICD-10-CM

## 2023-03-22 DIAGNOSIS — I5022 Chronic systolic (congestive) heart failure: Secondary | ICD-10-CM

## 2023-03-22 DIAGNOSIS — I10 Essential (primary) hypertension: Secondary | ICD-10-CM

## 2023-03-22 NOTE — Telephone Encounter (Signed)
-----   Message from Demetrius Charity, RN sent at 03/18/2023  2:20 PM EDT ----- Regarding: bipap follow up Greetings,  Patient was seen in office today and our NP seen he had recommendations for BiPAP, could you or someone in your office follow up with the patient.  Thank you,  Tiffany

## 2023-03-25 ENCOUNTER — Other Ambulatory Visit (HOSPITAL_BASED_OUTPATIENT_CLINIC_OR_DEPARTMENT_OTHER): Payer: Self-pay | Admitting: Family

## 2023-03-25 ENCOUNTER — Other Ambulatory Visit (HOSPITAL_COMMUNITY): Payer: Self-pay | Admitting: Emergency Medicine

## 2023-03-25 ENCOUNTER — Other Ambulatory Visit (HOSPITAL_COMMUNITY): Payer: Self-pay | Admitting: Cardiology

## 2023-03-25 ENCOUNTER — Other Ambulatory Visit (HOSPITAL_COMMUNITY): Payer: Self-pay | Admitting: Family Medicine

## 2023-03-25 DIAGNOSIS — I5020 Unspecified systolic (congestive) heart failure: Secondary | ICD-10-CM

## 2023-03-25 DIAGNOSIS — E785 Hyperlipidemia, unspecified: Secondary | ICD-10-CM

## 2023-03-25 NOTE — Telephone Encounter (Signed)
Patient of Dr. Hochrein. Please review for refill. Thank you!  

## 2023-03-25 NOTE — Progress Notes (Signed)
Paramedicine Encounter    Patient ID: Marcus Tran, male    DOB: 19-Apr-1951, 72 y.o.   MRN: [id number]   Complaints NONE  Assessment A&O x 4, skin W&D w/ good color.  Lung sounds clear throughout.  No peripheral edema noted.  Compliance with meds 100%  Pill box filled Bubble packs  Refills needed new bubble packs start 03/31/23 and ordered  Meds changes since last visit NONE    Social changes NONE   BP 110/80 (BP Location: Right Arm, Patient Position: Sitting, Cuff Size: Normal)   Pulse 68   Resp 14   Wt 153 lb 12.8 oz (69.8 kg)   SpO2 98%   BMI 25.59 kg/m  Weight yesterday-not take  Last visit weight-154lb  Today finds Marcus Tran reporting that he feels, "great".  He is doing well on his bubble packs.  He has a pending appointment for lab work on 6/10.  Will reach out to [place] to assist w/ scheduling transportation.  ACTION: Home visit completed  Marcus Tran [phone] 03/26/23  Patient Care Team: Marcus Aldo, NP as PCP - Marcus Armour, MD as PCP - Cardiology (Cardiology)  Patient Active Problem List   Diagnosis Date Noted   Cocaine use 01/15/2023   Noncompliance 01/13/2023   Adenovirus pneumonia 11/12/2022   Chronic HFrEF (heart failure with reduced ejection fraction) (HCC) 08/13/2022   DM2 (diabetes mellitus, type 2) (HCC) 08/13/2022   Obstructive sleep apnea (adult) (pediatric) 08/08/2022   Acute respiratory failure with hypoxia and hypercapnia (HCC) 05/04/2022   Third degree heart block (HCC) 04/15/2022   Snoring 04/15/2022   Nonischemic cardiomyopathy (HCC) 04/15/2022   HFrEF (heart failure with reduced ejection fraction) (HCC) 04/15/2022   COPD with acute exacerbation (HCC) 03/22/2022   Chronic combined systolic and diastolic CHF (congestive heart failure) (HCC)    Tobacco use disorder 03/08/2022   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 Swallow whole., Disp: 90 tablet, Rfl: 3   bisoprolol (ZEBETA) 5 MG tablet, TAKE 1 Tablet BY MOUTH ONCE DAILY, Disp: 30 tablet, Rfl: 6   FARXIGA 10 MG TABS tablet, TAKE 1 Tablet BY MOUTH ONCE DAILY (MORNING), Disp: 30 tablet, Rfl: 5   ipratropium-albuterol (DUONEB) 0.5-2.5 (3) MG/3ML SOLN, Inhale 3 mLs into the lungs in the morning, at noon, in the evening, and at bedtime. (Patient taking differently: Inhale 3 mLs into the lungs See admin instructions. Inhale 1 vial via nebulizer 3-4 times daily as needed.), Disp: 360 mL, Rfl: 11   isosorbide-hydrALAZINE (BIDIL) 20-37.5 MG tablet, Take 1 tablet by mouth 3 (three) times daily., Disp: 90 tablet, Rfl: 3   ketoconazole (NIZORAL) 2 % cream, Apply 1 Application topically daily as needed for irritation., Disp: , Rfl:    metFORMIN (GLUCOPHAGE) 500 MG tablet, Take 1 tablet (500 mg total) by mouth 2 (two) times daily with a meal., Disp: 60 tablet, Rfl: 0   montelukast (SINGULAIR) 10 MG tablet, Take 10 mg by mouth at bedtime., Disp: , Rfl:    sacubitril-valsartan (ENTRESTO) 24-26 MG, Take 1 tablet by mouth 2 (  two) times daily., Disp: 60 tablet, Rfl: 11   TRELEGY ELLIPTA 100-62.5-25 MCG/ACT AEPB, Take 1 puff by mouth daily., Disp: , Rfl:    atorvastatin (LIPITOR) 80 MG tablet, TAKE 1 Tablet BY MOUTH ONCE DAILY EVERY EVENING, Disp: 90 tablet, Rfl: 3   furosemide (LASIX) 40 MG tablet, TAKE 1 Tablet BY MOUTH TWICE DAILY (morning AND evening), Disp: 180 tablet, Rfl: 3 No Known Allergies   Social History   Socioeconomic History   Marital status: Single    Spouse name: Not on file   Number of children: 1   Years of education: Not  on file   Highest education level: 10th grade  Occupational History   Occupation: Disability  Tobacco Use   Smoking status: Some Days    Packs/day: 0.50    Years: 50.00    Additional pack years: 0.00    Total pack years: 25.00    Types: Cigarettes    Last attempt to quit: 03/19/2022    Years since quitting: 1.0   Smokeless tobacco: Never   Tobacco comments:    Smoking cessation  Vaping Use   Vaping Use: Never used  Substance and Sexual Activity   Alcohol use: Not on file    Comment: socially   Drug use: Yes    Types: Marijuana    Comment: past   Sexual activity: Not on file  Other Topics Concern   Not on file  Social History Narrative   He lives with his daughter apparently and 5 grandchildren.  He reports that he smokes probably less than a pack of cigarettes a day.  He occasionally drinks a beer or liquor but not daily.   Social Determinants of Health   Financial Resource Strain: Low Risk  (03/11/2022)   Overall Financial Resource Strain (CARDIA)    Difficulty of Paying Living Expenses: Not very hard  Food Insecurity: No Food Insecurity (01/13/2023)   Hunger Vital Sign    Worried About Running Out of Food in the Last Year: Never true    Ran Out of Food in the Last Year: Never true  Recent Concern: Food Insecurity - Food Insecurity Present (10/26/2022)   Hunger Vital Sign    Worried About Running Out of Food in the Last Year: Often true    Ran Out of Food in the Last Year: Often true  Transportation Needs: No Transportation Needs (01/13/2023)   PRAPARE - Administrator, Civil Service (Medical): No    Lack of Transportation (Non-Medical): No  Recent Concern: Transportation Needs - Unmet Transportation Needs (12/30/2022)   PRAPARE - Administrator, Civil Service (Medical): Yes    Lack of Transportation (Non-Medical): Yes  Physical Activity: Not on file  Stress: Not on file  Social Connections: Not on file  Intimate Partner Violence: Not At Risk  (01/13/2023)   Humiliation, Afraid, Rape, and Kick questionnaire    Fear of Current or Ex-Partner: No    Emotionally Abused: No    Physically Abused: No    Sexually Abused: No    Physical Exam      Future Appointments  Date Time Provider Department Center  03/29/2023 12:30 PM [facility] LAB [facility] None  03/31/2023  8:00 AM [facility] INJ 1 [facility]  03/31/2023 10:00 AM MC-NM 1 [facility]-NM [facility]  04/14/2023 11:00 AM [facility] PHARMACY [facility] None  06/25/2023 10:00 AM MC ECHO OP 1 MC-ECHOLAB [facility]  06/25/2023 11:00 AM Laurey Morale, MD MC-[facility] None

## 2023-03-26 ENCOUNTER — Telehealth (HOSPITAL_COMMUNITY): Payer: Self-pay | Admitting: Licensed Clinical Social Worker

## 2023-03-26 ENCOUNTER — Telehealth (HOSPITAL_COMMUNITY): Payer: Self-pay | Admitting: Emergency Medicine

## 2023-03-26 NOTE — Telephone Encounter (Signed)
Spoke with Crystal regarding Mr Lienhard transportation information for Monday 6/10.  His ride will pick him up at 12:00 for his 12:30 lab appointment.  She advised she would give him this information and he will call me later today to confirm he knows about same.    Beatrix Shipper, EMT-Paramedic [phone] 03/26/2023

## 2023-03-26 NOTE — Telephone Encounter (Signed)
H&V Care Navigation CSW Progress Note  Clinical Social Worker informed by paramedic that pt needs ride to appt next week.  CSW set up taxi ride for Monday.   SDOH Screenings   Food Insecurity: No Food Insecurity (01/13/2023)  Recent Concern: Food Insecurity - Food Insecurity Present (10/26/2022)  Housing: Low Risk  (01/13/2023)  Transportation Needs: Unmet Transportation Needs (03/26/2023)  Utilities: Not At Risk (01/13/2023)  Alcohol Screen: Low Risk  (05/06/2022)  Financial Resource Strain: Low Risk  (03/11/2022)  Tobacco Use: High Risk (03/18/2023)    03/26/2023  Marcus Tran DOB: 09-Dec-1950 MRN: [id number]   RIDER WAIVER AND RELEASE OF LIABILITY  For the purposes of helping with transportation needs, [facility] partners with outside transportation providers (taxi companies, [place], Catering manager.) to give [name] patients or other approved people the choice of on-demand rides [name]") to our buildings for non-emergency visits.  By using [employer], I, the person signing this document, on behalf of myself and/or any legal minors (in my care using the [employer]), agree:  Science writer given to me are supplied by independent, outside transportation providers who do not work for, or have any affiliation with, [name]. [place] is not a transportation company. [place] has no control over the quality or safety of the rides I get using [employer]. [place] has no control over whether any outside ride will happen on time or not. [place] gives no guarantee on the reliability, quality, safety, or availability on any rides, or that no mistakes will happen. I know and accept that traveling by vehicle (car, truck, SVU, Zenaida Niece, bus, taxi, etc.) has risks of serious injuries such as disability, being paralyzed, and death. I know and agree the risk of using [employer] is mine alone, and not [employer]. Transport Services are provided "as is" and  as are available. The transportation providers are in charge for all inspections and care of the vehicles used to provide these rides. I agree not to take legal action against [place], its agents, employees, officers, directors, representatives, insurers, attorneys, assigns, successors, subsidiaries, and affiliates at any time for any reasons related directly or indirectly to using [employer]. I also agree not to take legal action against [place] or its affiliates for any injury, death, or damage to property caused by or related to using [employer]. I have read this Waiver and Release of Liability, and I understand the terms used in it and their legal meaning. This Waiver is freely and voluntarily given with the understanding that my right (or any legal minors) to legal action against  relating to [employer] is knowingly given up to use these services.   I attest that I read the Ride Waiver and Release of Liability to Marcus Tran, gave [name] the opportunity to ask questions and answered the questions asked (if any). I affirm that Marcus Tran then provided consent for assistance with transportation.     Burna Sis

## 2023-03-29 ENCOUNTER — Ambulatory Visit (HOSPITAL_COMMUNITY)
Admission: RE | Admit: 2023-03-29 | Discharge: 2023-03-29 | Disposition: A | Discharge status: Home / Self Care | Payer: 59 | Source: Ambulatory Visit | Attending: Internal Medicine | Admitting: Internal Medicine

## 2023-03-29 DIAGNOSIS — I5022 Chronic systolic (congestive) heart failure: Secondary | ICD-10-CM | POA: Insufficient documentation

## 2023-03-29 LAB — BASIC METABOLIC PANEL
Anion gap: 13 (ref 5–15)
BUN: 18 mg/dL (ref 8–23)
CO2: 25 mmol/L (ref 22–32)
Calcium: 9.7 mg/dL (ref 8.9–10.3)
Chloride: 95 mmol/L — ABNORMAL LOW (ref 98–111)
Creatinine, Ser: 1.56 mg/dL — ABNORMAL HIGH (ref 0.61–1.24)
GFR, Estimated: 47 mL/min — ABNORMAL LOW (ref >60)
Glucose, Bld: 145 mg/dL — ABNORMAL HIGH (ref 70–99)
Potassium: 4.1 mmol/L (ref 3.5–5.1)
Sodium: 133 mmol/L — ABNORMAL LOW (ref 135–145)

## 2023-03-31 ENCOUNTER — Encounter (HOSPITAL_COMMUNITY): Payer: 59

## 2023-03-31 ENCOUNTER — Encounter (HOSPITAL_COMMUNITY)
Admission: RE | Admit: 2023-03-31 | Discharge: 2023-03-31 | Disposition: A | Discharge status: Home / Self Care | Payer: 59 | Source: Ambulatory Visit | Attending: Family Medicine | Admitting: Family Medicine

## 2023-03-31 DIAGNOSIS — I5022 Chronic systolic (congestive) heart failure: Secondary | ICD-10-CM | POA: Insufficient documentation

## 2023-03-31 MED ORDER — TECHNETIUM TC 99M PYROPHOSPHATE
21.2000 | Freq: Once | INTRAVENOUS | Status: AC | PRN
  Administered 2023-03-31: 21.2 via INTRAVENOUS

## 2023-04-12 NOTE — Progress Notes (Signed)
He will need AL rule out labs before we can submit a prior authorization through his insurance. Needs Serum free light chains, Urine Protein Electrophoresis and Immunofixation and Serum Protein Electrophoresis and Immunofixation (ordered as multiple myeloma panel).

## 2023-04-13 ENCOUNTER — Telehealth (HOSPITAL_COMMUNITY): Payer: Self-pay | Admitting: Licensed Clinical Social Worker

## 2023-04-13 NOTE — Telephone Encounter (Signed)
H&V Care Navigation CSW Progress Note  Clinical Social Worker assisted in arranging transport for pt to get to appt next week.  Spoke with dtr and provided with ride details.   SDOH Screenings   Food Insecurity: No Food Insecurity (01/13/2023)  Recent Concern: Food Insecurity - Food Insecurity Present (10/26/2022)  Housing: Low Risk  (01/13/2023)  Transportation Needs: Unmet Transportation Needs (04/13/2023)  Utilities: Not At Risk (01/13/2023)  Alcohol Screen: Low Risk  (05/06/2022)  Financial Resource Strain: Low Risk  (03/11/2022)  Tobacco Use: High Risk (03/18/2023)    04/13/2023  Marcus Tran DOB: 01-15-1951 MRN: [id number]   RIDER WAIVER AND RELEASE OF LIABILITY  For the purposes of helping with transportation needs, [facility] partners with outside transportation providers (taxi companies, [place], Catering manager.) to give [name] patients or other approved people the choice of on-demand rides [name]") to our buildings for non-emergency visits.  By using [employer], I, the person signing this document, on behalf of myself and/or any legal minors (in my care using the [employer]), agree:  Science writer given to me are supplied by independent, outside transportation providers who do not work for, or have any affiliation with, [name]. [place] is not a transportation company. [place] has no control over the quality or safety of the rides I get using [employer]. [place] has no control over whether any outside ride will happen on time or not. [place] gives no guarantee on the reliability, quality, safety, or availability on any rides, or that no mistakes will happen. I know and accept that traveling by vehicle (car, truck, SVU, Zenaida Niece, bus, taxi, etc.) has risks of serious injuries such as disability, being paralyzed, and death. I know and agree the risk of using [employer] is mine alone, and not [employer]. Transport Services are  provided "as is" and as are available. The transportation providers are in charge for all inspections and care of the vehicles used to provide these rides. I agree not to take legal action against [place], its agents, employees, officers, directors, representatives, insurers, attorneys, assigns, successors, subsidiaries, and affiliates at any time for any reasons related directly or indirectly to using [employer]. I also agree not to take legal action against [place] or its affiliates for any injury, death, or damage to property caused by or related to using [employer]. I have read this Waiver and Release of Liability, and I understand the terms used in it and their legal meaning. This Waiver is freely and voluntarily given with the understanding that my right (or any legal minors) to legal action against [place] relating to [employer] is knowingly given up to use these services.   I attest that I read the Ride Waiver and Release of Liability to Marcus Tran, gave Mr. Bloor the opportunity to ask questions and answered the questions asked (if any). I affirm that Marcus Tran then provided consent for assistance with transportation.     Burna Sis

## 2023-04-14 ENCOUNTER — Inpatient Hospital Stay (HOSPITAL_COMMUNITY): Admission: RE | Admit: 2023-04-14 | Payer: 59 | Source: Ambulatory Visit

## 2023-04-16 ENCOUNTER — Telehealth (HOSPITAL_COMMUNITY): Payer: Self-pay | Admitting: Emergency Medicine

## 2023-04-16 NOTE — Telephone Encounter (Signed)
Spoke with Katheran James to let her know her dad's appointment next week is Tues. 7/2 @ 11:00 with Dr. Shirlee Latch.  I will send message to Eileen Stanford to get transportation set up for him.    Beatrix Shipper, EMT-Paramedic [phone] 04/16/2023

## 2023-04-19 ENCOUNTER — Telehealth (HOSPITAL_COMMUNITY): Payer: Self-pay

## 2023-04-19 NOTE — Telephone Encounter (Signed)
Prior Authorization for BIPAP sent to [facility] via web portal. Tracking Number . Prior Authorization is not required -[place] ID #: [id number]

## 2023-04-19 NOTE — Telephone Encounter (Signed)
Confirmed with Eileen Stanford that Mr. Mayle has ride set up  for upcoming HF clinic visit.   April 13, 2023 Aaron Mose      04/13/23  2:24 PM Note H&V Care Navigation CSW Progress Note   Clinical Social Worker assisted in arranging transport for pt to get to appt next week.  Spoke with dtr and provided with ride details.

## 2023-04-20 ENCOUNTER — Other Ambulatory Visit (HOSPITAL_COMMUNITY): Payer: Self-pay | Admitting: Emergency Medicine

## 2023-04-20 ENCOUNTER — Encounter (HOSPITAL_COMMUNITY): Payer: Self-pay | Admitting: Cardiology

## 2023-04-20 ENCOUNTER — Ambulatory Visit (HOSPITAL_COMMUNITY)
Admission: RE | Admit: 2023-04-20 | Discharge: 2023-04-20 | Disposition: A | Discharge status: Home / Self Care | Payer: 59 | Source: Ambulatory Visit | Attending: Cardiology | Admitting: Cardiology

## 2023-04-20 VITALS — BP 90/50 | HR 72 | Wt 151.4 lb

## 2023-04-20 DIAGNOSIS — I5022 Chronic systolic (congestive) heart failure: Secondary | ICD-10-CM

## 2023-04-20 DIAGNOSIS — E119 Type 2 diabetes mellitus without complications: Secondary | ICD-10-CM | POA: Insufficient documentation

## 2023-04-20 DIAGNOSIS — Z79899 Other long term (current) drug therapy: Secondary | ICD-10-CM | POA: Diagnosis not present

## 2023-04-20 DIAGNOSIS — Z87891 Personal history of nicotine dependence: Secondary | ICD-10-CM | POA: Insufficient documentation

## 2023-04-20 DIAGNOSIS — I428 Other cardiomyopathies: Secondary | ICD-10-CM | POA: Insufficient documentation

## 2023-04-20 DIAGNOSIS — G4733 Obstructive sleep apnea (adult) (pediatric): Secondary | ICD-10-CM | POA: Insufficient documentation

## 2023-04-20 DIAGNOSIS — Z720 Tobacco use: Secondary | ICD-10-CM

## 2023-04-20 DIAGNOSIS — I2510 Atherosclerotic heart disease of native coronary artery without angina pectoris: Secondary | ICD-10-CM | POA: Diagnosis not present

## 2023-04-20 DIAGNOSIS — R55 Syncope and collapse: Secondary | ICD-10-CM | POA: Insufficient documentation

## 2023-04-20 DIAGNOSIS — I110 Hypertensive heart disease with heart failure: Secondary | ICD-10-CM | POA: Insufficient documentation

## 2023-04-20 DIAGNOSIS — Z5982 Transportation insecurity: Secondary | ICD-10-CM | POA: Diagnosis not present

## 2023-04-20 DIAGNOSIS — E785 Hyperlipidemia, unspecified: Secondary | ICD-10-CM | POA: Insufficient documentation

## 2023-04-20 DIAGNOSIS — Z7982 Long term (current) use of aspirin: Secondary | ICD-10-CM | POA: Insufficient documentation

## 2023-04-20 DIAGNOSIS — I10 Essential (primary) hypertension: Secondary | ICD-10-CM | POA: Diagnosis not present

## 2023-04-20 NOTE — Patient Instructions (Addendum)
There has been no changes to your medications.  Labs done today, your results will be available in MyChart, we will contact you for abnormal readings.  Genetic test has been done, this has to be sent to [place] to be processed and can take 1-2 weeks to get results back.  We will let you know the results.  Keep your follow up appointment with Dr. Shirlee Latch as scheduled.  If you have any questions or concerns before your next appointment please send [place] a message through [place] or call our office at [phone].    TO LEAVE A MESSAGE FOR THE NURSE SELECT OPTION 2, PLEASE LEAVE A MESSAGE INCLUDING: YOUR NAME DATE OF BIRTH CALL BACK NUMBER REASON FOR CALL**this is important as we prioritize the call backs  YOU WILL RECEIVE A CALL BACK THE SAME DAY AS LONG AS YOU CALL BEFORE 4:00 PM  At the [facility], you and your health needs are our priority. As part of our continuing mission to provide you with exceptional heart care, we have created designated Provider Care Teams. These Care Teams include your primary Cardiologist (physician) and Advanced Practice Providers (APPs- Physician Assistants and Nurse Practitioners) who all work together to provide you with the care you need, when you need it.   You may see any of the following providers on your designated Care Team at your next follow up: Dr Arvilla Meres Dr Marca Ancona Dr. Marcos Eke, NP Robbie Lis, [place] [facility] [place], [place] Brynda Peon, NP Karle Plumber, PharmD   Please be sure to bring in all your medications bottles to every appointment.    Thank you for choosing [facility]

## 2023-04-20 NOTE — Progress Notes (Signed)
ADVANCED HF CLINIC NOTE  PCP: Hillery Aldo, NP [facility] PCP  Primary Cardiologist: Rollene Rotunda, MD  [facility]: Dr. Shirlee Latch   HPI: 72 y.o. [name] with hx tobacco use, COPD, OSA on Bipap, HTN, and chronic HFmEF.   Admitted 03/06/2022 with acute hypoxic respiratory failure secondary to suspected COPD and acute systolic CHF.  Initially in respiratory distress and required BiPAP. BP significantly elevated at 189/133, remained elevated throughout much of admission but improved with titration of GDMT. CTA chest negative for PE. Echo EF 35%, RV okay, RVSP 54 mmhg. R/LHC: nonobstructive CAD, RA mean 12, PA 54/29 (37), PCWP mean 23, LVEDP 22 mmHg, Fick CO/CI 3.84/2.12. He diuresed with IV lasix. Discharged home on 03/11/22, weight 05/23 163 lb.    He was readmitted 06/02-06/04/23 with acute on chronic respiratory failure with hypoxia secondary to acute COPD exacerbation. Also thought to possibly have some component of a/c CHF. He diuresed with IV lasix and received doxycycline + prednisone. Discharge weight 151 lb.    Seen in [facility] clinic on 03/23/2022 . Volume looked good. Added entresto and cut back lasix. He was referred back to Cardiology. 14 day zio ordered at cardiology f/u - rhythm was sinus with short runs NSVT and SVT, bradyarrhythmias, idioventricular rhythm, with episodes of third-degree HB noted during sleep hours.   Saw Dr. Antoine Poche for f/u 04/16/22. Bradycardia thought to be possibly d/t untreated OSA. Sleep evaluation recommended before considering a device. Sleep study ordered. He was having more shortness of breath and cough. Volume looked good. He was referred to pulmonary and was treated the same day for suspected COPD exacerbation. Pulmonary ordered PFTs.   Admitted 7/23 with acute respiratory failure with hypoxia. O2 sats 80s. He initially required BiPAP. Hypertensive with SBP 210/120 and placed on nitro gtt. He was admitted for AECOPD and a/c CHF.  Diuresed with IV lasix. Cardiology  consulted and felt presentation more likely consistent with AECOPD rather than CHF. Had runs of NSVT up to 20 beats in duration.    Referred to [facility] clinic for post hospital. Was fitted for LifeVest and GDMT adjusted. Referred to the AHF clinic for further management.   Echo 8/23 EF improved 40-45%  with LVH. Recommended PYP/ CMRI  Sleep study 9/23 showed severe OSA with AHI of 42.6/hr. He declined in lab BiPap titration.  ED evaluation 10/16/22 for syncope. Low suspicion for PE. CXR ok. COVID negative. K 5.5. Suspected vasovagal episode during coughing fit. He instructed to take OTC for URI.   Follow up 10/26/22, NYHA III and volume stable. Spiro/KCL stopped with recent hyperkalemia.   Admitted 1/24 for AECOPD. Transiently required BiPap, then able to wean to nasal cannula. Found to have AKI and GDMT initially held then restarted per home regimen. Started on prednisone taper. He was discharged home, weight 155 lbs.  Follow up 2/24, stable NYHA I-II, volume OK. PYP arranged to rule out cardiac amyloid.  Admitted 3/24 with COPD exacerbation. Required BiPap and steroids. Started on IV Solu-Medrol. UDS + cocaine. Cleda Daub and Sherryll Burger stopped with AKI.    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   Today he returns for HF follow up. Overall feeling fine. SOB with inclines. Denies PND/Orthopnea. Denies dizziness. Appetite ok. No fever or chills. Weight at home 150-151 pounds. Taking all medications. Smoking 5-6 cigarettes per day. No recent cocaine. Lives with daughter and 5 grandchildren. Followed by HF Paramedicine. SBP at home 110-130/ Has trouble getting food and transportation.   Labs (3/24): K  4.1, creatinine 1.74 Labs (03/29/23): K 4.1 Creatinine 1.56.   Cardiac Studies  - Echo (8/23): EF 40-45% w/ LVH  - R/LHC (5/23): R/LHC: nonobstructive CAD, RA mean 12, PA 54/29 (37), PCWP mean 23, LVEDP 22 mmHg, Fick CO/CI 3.84/2.12.  - Echo (5/23): EF 35%, RV okay, RVSP 54 mmhg.  Past  Medical History:  Diagnosis Date   Acute metabolic encephalopathy 03/08/2022   Acute respiratory failure with hypoxia (HCC) 03/20/2022   Acute respiratory failure with hypoxia and hypercapnia (HCC) 03/06/2022   CHF (congestive heart failure) (HCC)    Dyslipidemia    Hypertension    Current Outpatient Medications  Medication Sig Dispense Refill   albuterol (VENTOLIN HFA) 108 (90 Base) MCG/ACT inhaler Inhale 2 puffs into the lungs every 4 (four) hours as needed for wheezing or shortness of breath. 1 each 2   aspirin EC 81 MG tablet Take 1 tablet (81 mg total) by mouth daily. Swallow whole. 90 tablet 3   atorvastatin (LIPITOR) 80 MG tablet TAKE 1 Tablet BY MOUTH ONCE DAILY EVERY EVENING 90 tablet 3   bisoprolol (ZEBETA) 5 MG tablet TAKE 1 Tablet BY MOUTH ONCE DAILY 30 tablet 6   FARXIGA 10 MG TABS tablet TAKE 1 Tablet BY MOUTH ONCE DAILY (MORNING) 30 tablet 5   furosemide (LASIX) 40 MG tablet TAKE 1 Tablet BY MOUTH TWICE DAILY (morning AND evening) 180 tablet 3   ipratropium-albuterol (DUONEB) 0.5-2.5 (3) MG/3ML SOLN Inhale 3 mLs into the lungs in the morning, at noon, in the evening, and at bedtime. 360 mL 11   isosorbide-hydrALAZINE (BIDIL) 20-37.5 MG tablet Take 1 tablet by mouth 3 (three) times daily. 90 tablet 3   ketoconazole (NIZORAL) 2 % cream Apply 1 Application topically daily as needed for irritation.     metFORMIN (GLUCOPHAGE) 500 MG tablet Take 1 tablet (500 mg total) by mouth 2 (two) times daily with a meal. 60 tablet 0   montelukast (SINGULAIR) 10 MG tablet Take 10 mg by mouth at bedtime.     sacubitril-valsartan (ENTRESTO) 24-26 MG Take 1 tablet by mouth 2 (two) times daily. 60 tablet 11   TRELEGY ELLIPTA 100-62.5-25 MCG/ACT AEPB Take 1 puff by mouth daily.     No current facility-administered medications for this encounter.   No Known Allergies  Social History   Socioeconomic History   Marital status: Single    Spouse name: Not on file   Number of children: 1   Years of  education: Not on file   Highest education level: 10th grade  Occupational History   Occupation: Disability  Tobacco Use   Smoking status: Some Days    Packs/day: 0.50    Years: 50.00    Additional pack years: 0.00    Total pack years: 25.00    Types: Cigarettes    Last attempt to quit: 03/19/2022    Years since quitting: 1.0   Smokeless tobacco: Never   Tobacco comments:    Smoking cessation  Vaping Use   Vaping Use: Never used  Substance and Sexual Activity   Alcohol use: Not on file    Comment: socially   Drug use: Yes    Types: Marijuana    Comment: past   Sexual activity: Not on file  Other Topics Concern   Not on file  Social History Narrative   He lives with his daughter apparently and 5 grandchildren.  He reports that he smokes probably less than a pack of cigarettes a day.  He occasionally drinks  a beer or liquor but not daily.   Social Determinants of Health   Financial Resource Strain: Low Risk  (03/11/2022)   Overall Financial Resource Strain (CARDIA)    Difficulty of Paying Living Expenses: Not very hard  Food Insecurity: No Food Insecurity (01/13/2023)   Hunger Vital Sign    Worried About Running Out of Food in the Last Year: Never true    Ran Out of Food in the Last Year: Never true  Recent Concern: Food Insecurity - Food Insecurity Present (10/26/2022)   Hunger Vital Sign    Worried About Programme researcher, broadcasting/film/video in the Last Year: Often true    Ran Out of Food in the Last Year: Often true  Transportation Needs: Unmet Transportation Needs (04/13/2023)   PRAPARE - Administrator, Civil Service (Medical): Yes    Lack of Transportation (Non-Medical): Yes  Physical Activity: Not on file  Stress: Not on file  Social Connections: Not on file  Intimate Partner Violence: Not At Risk (01/13/2023)   Humiliation, Afraid, Rape, and Kick questionnaire    Fear of Current or Ex-Partner: No    Emotionally Abused: No    Physically Abused: No    Sexually Abused: No    Family History  Problem Relation Age of Onset   Hypertension Mother    BP (!) 90/50   Pulse 72   Wt 68.7 kg (151 lb 6.4 oz)   SpO2 95%   BMI 25.19 kg/m   Wt Readings from Last 3 Encounters:  04/20/23 68.7 kg (151 lb 6.4 oz)  03/25/23 69.8 kg (153 lb 12.8 oz)  03/18/23 70 kg (154 lb 6.4 oz)   PHYSICAL EXAM: General:  Well appearing. No resp difficulty. Walked in the clinic.  HEENT: normal Neck: supple. no JVD. Carotids 2+ bilat; no bruits. No lymphadenopathy or thryomegaly appreciated. Cor: PMI nondisplaced. Regular rate & rhythm. No rubs, gallops or murmurs. Lungs: clear Abdomen: soft, nontender, nondistended. No hepatosplenomegaly. No bruits or masses. Good bowel sounds. Extremities: no cyanosis, clubbing, rash, edema Neuro: alert & orientedx3, cranial nerves grossly intact. moves all 4 extremities w/o difficulty. Affect pleasant  EKG: SR 70 bpm RBBB QRS 144 ms   ASSESSMENT & PLAN:  Chronic Systolic Heart Failure/ NICM: - Echo 5/23: LVEF 35%, RV okay - R/LHC 5/23: Nonobstructive CAD, RA mean 12, PA 54/29 (37), PCWP mean 23, LVEDP 22 mmHg - Echo (8/23): EF improved 40-45%. Out of window for ICD.  - Has LVH. PYP - Strongly suggestive of TTR. Discussed results with him. We discussed starting process to Tafamidis approval. Complete TTR Genetic Test. Check Myeloma Panel.  - NYHA II-III. HF confounded by COPD.  - SBP soft. No room to titrate GDMT.  - Continue t Entresto 24/26 mg bid.  -  Continue Lasix 40 mg bid for now, plan to pull back at next visit.  - Continue Farxiga 10 mg daily. No GU symptoms. - Continue bisoprolol 5 mg daily.  - Continue BiDil 1 tab tid. - Hold off on spiro with soft SBP.  - Repeat echo in Spetember.  2. Syncope: - Multiple recent episodes in the past. Evaluated in the ED 12/23. Event felt to by vasovagal.  - 2 week Zio (6/23) showed mostly NSR, significant bradycardia during sleeping hours w/ intermittent CHB - Has had sleep study (see  below) - No recent events.    3. HTN: - BP stable. - GDMT as above  4. OSA: Severe by sleep study. Needs BiPap titration.  Needs f/u and overnight sleep study. HF Paramedic is going to help with f/u.    5. CAD: - Nonobstructive on LHC 5/23 - No chest pain.  - Continue aspirin and statin.   6. Tobacco use/suspected COPD: - Coninues to smoke. Discussed cessation.  - On Trelegy - Followed by Pulmonary  7. HLD: - On atorvastatin - Management per Cardiology or PCP   8. DM II: - A1c 6.8% in 1/24 - Continue Farxiga  9. Tobacco Abuse Discussed tobacco cessation.   10. SDOH - Difficulty paying for food and transportation.  - HFSW helping with resources. - ? if Managed Medicaid Team for chronic disease management is an option.  - Will need to continue HF Paramedicine.   Follow up with Dr Shirlee Latch in September with Dr Shirlee Latch and an ECHO  Giovanne Nickolson Filbert Schilder, NP-C  04/20/23

## 2023-04-20 NOTE — Progress Notes (Signed)
Blood collected for TTR genetic testing per Dr. Dalton McLean.  Order form completed and both shipped by [employer] to [facility].  

## 2023-04-20 NOTE — Progress Notes (Signed)
Paramedicine Encounter   Patient ID: Marcus Tran , male,   DOB: 1951-06-29,72 y.o.,  MRN: [id number]   Met patient in clinic today with provider.  Time spent with patient   Needs refill on Trelegy- to be delivered this afternoon by [facility]  Amy Clegg seeing [name] for Dr. Shirlee Latch.  Discussed today was genetic testing for Amyloidosis to determine the type.  Bloodwork will be done today.   No med changes at this time. Blood pressure running on the low side of normal.  He denies dizziness or weakness. Clinic visit complete.     Beatrix Shipper, EMT-Paramedic  [phone] 04/20/2023

## 2023-04-23 ENCOUNTER — Other Ambulatory Visit: Payer: Self-pay | Admitting: Pulmonary Disease

## 2023-04-26 LAB — MULTIPLE MYELOMA PANEL, SERUM
Albumin SerPl Elph-Mcnc: 3.9 g/dL (ref 2.9–4.4)
Albumin/Glob SerPl: 1.2 (ref 0.7–1.7)
Alpha 1: 0.2 g/dL (ref 0.0–0.4)
Alpha2 Glob SerPl Elph-Mcnc: 0.8 g/dL (ref 0.4–1.0)
B-Globulin SerPl Elph-Mcnc: 1 g/dL (ref 0.7–1.3)
Gamma Glob SerPl Elph-Mcnc: 1.3 g/dL (ref 0.4–1.8)
Globulin, Total: 3.4 g/dL (ref 2.2–3.9)
IgA: 355 mg/dL (ref 61–437)
IgG (Immunoglobin G), Serum: 1330 mg/dL (ref 603–1613)
IgM (Immunoglobulin M), Srm: 45 mg/dL (ref 15–143)
Total Protein ELP: 7.3 g/dL (ref 6.0–8.5)

## 2023-04-27 ENCOUNTER — Other Ambulatory Visit (HOSPITAL_COMMUNITY): Payer: Self-pay | Admitting: Emergency Medicine

## 2023-04-27 NOTE — Progress Notes (Signed)
Paramedicine Encounter    Patient ID: Marcus Tran, male    DOB: [birth year], 72 y.o.   MRN: [id number]   Complaints NONE  Assessment A&O x 4, skin W&D w/ good color.  No edema noted to extremities. Lung sounds clear throughout  Compliance with meds 100% on bubble packs   Pill box filled n/a   Refills needed NONE  Meds changes since last visit NONE    Social changes NONE   BP 100/70 (BP Location: Left Arm, Patient Position: Standing, Cuff Size: Normal)   Pulse 85   Resp 16   Wt 150 lb (68 kg)   SpO2 96%   BMI 24.96 kg/m  Weight yesterday-not taken Last visit weight-151lb  Picked up bubble packs from [facility] and delivered to [name] today.  He reports to be feeling well and has had no chest pain or SOB.  Lung sounds clear and equal throughout.   ACTION: Home visit completed  Bethanie Dicker [phone] 04/27/23  Patient Care Team: Hillery Aldo, NP as PCP - Reatha Armour, MD as PCP - Cardiology (Cardiology)  Patient Active Problem List   Diagnosis Date Noted   Cocaine use 01/15/2023   Noncompliance 01/13/2023   Adenovirus pneumonia 11/12/2022   Chronic HFrEF (heart failure with reduced ejection fraction) (HCC) 08/13/2022   DM2 (diabetes mellitus, type 2) (HCC) 08/13/2022   Obstructive sleep apnea (adult) (pediatric) 08/08/2022   Acute respiratory failure with hypoxia and hypercapnia (HCC) 05/04/2022   Third degree heart block (HCC) 04/15/2022   Snoring 04/15/2022   Nonischemic cardiomyopathy (HCC) 04/15/2022   HFrEF (heart failure with reduced ejection fraction) (HCC) 04/15/2022   COPD with acute exacerbation (HCC) 03/22/2022   Chronic combined systolic and diastolic CHF (congestive heart failure) (HCC)    Tobacco use disorder 03/08/2022   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 Swallow whole., Disp: 90 tablet, Rfl: 3   atorvastatin (LIPITOR) 80 MG tablet, TAKE 1 Tablet BY MOUTH ONCE DAILY EVERY EVENING, Disp: 90 tablet, Rfl: 3   bisoprolol (ZEBETA) 5 MG tablet, TAKE 1 Tablet BY MOUTH ONCE DAILY, Disp: 30 tablet, Rfl: 6   FARXIGA 10 MG TABS tablet, TAKE 1 Tablet BY MOUTH ONCE DAILY (MORNING), Disp: 30 tablet, Rfl: 5   furosemide (LASIX) 40 MG tablet, TAKE 1 Tablet BY MOUTH TWICE DAILY (morning AND evening), Disp: 180 tablet, Rfl: 3   ipratropium-albuterol (DUONEB) 0.5-2.5 (3) MG/3ML SOLN, Inhale 3 mLs into the lungs in the morning, at noon, in the evening, and at bedtime., Disp: 360 mL, Rfl: 11   isosorbide-hydrALAZINE (BIDIL) 20-37.5 MG tablet, Take 1 tablet by mouth 3 (three) times daily., Disp: 90 tablet, Rfl: 3   ketoconazole (NIZORAL) 2 % cream, Apply 1 Application topically daily as needed for irritation., Disp: , Rfl:    metFORMIN (GLUCOPHAGE) 500 MG tablet, Take 1 tablet (500 mg total) by mouth 2 (two) times daily with a meal., Disp: 60 tablet, Rfl: 0   montelukast (SINGULAIR) 10 MG tablet, TAKE 1 Tablet BY MOUTH ONCE DAILY at bedtime (evening), Disp:  30 tablet, Rfl: 11   sacubitril-valsartan (ENTRESTO) 24-26 MG, Take 1 tablet by mouth 2 (two) times daily., Disp: 60 tablet, Rfl: 11   TRELEGY ELLIPTA 100-62.5-25 MCG/ACT AEPB, Take 1 puff by mouth daily., Disp: , Rfl:  No Known Allergies   Social History   Socioeconomic History   Marital status: Single    Spouse name: Not on file   Number of children: 1   Years of education: Not on file   Highest education level: 10th grade  Occupational History   Occupation: Disability  Tobacco Use   Smoking status: Some Days    Packs/day: 0.50     Years: 50.00    Additional pack years: 0.00    Total pack years: 25.00    Types: Cigarettes    Last attempt to quit: 03/19/2022    Years since quitting: 1.1   Smokeless tobacco: Never   Tobacco comments:    Smoking cessation  Vaping Use   Vaping Use: Never used  Substance and Sexual Activity   Alcohol use: Not on file    Comment: socially   Drug use: Yes    Types: Marijuana    Comment: past   Sexual activity: Not on file  Other Topics Concern   Not on file  Social History Narrative   He lives with his daughter apparently and 5 grandchildren.  He reports that he smokes probably less than a pack of cigarettes a day.  He occasionally drinks a beer or liquor but not daily.   Social Determinants of Health   Financial Resource Strain: Low Risk  (03/11/2022)   Overall Financial Resource Strain (CARDIA)    Difficulty of Paying Living Expenses: Not very hard  Food Insecurity: No Food Insecurity (01/13/2023)   Hunger Vital Sign    Worried About Running Out of Food in the Last Year: Never true    Ran Out of Food in the Last Year: Never true  Recent Concern: Food Insecurity - Food Insecurity Present (10/26/2022)   Hunger Vital Sign    Worried About Running Out of Food in the Last Year: Often true    Ran Out of Food in the Last Year: Often true  Transportation Needs: Unmet Transportation Needs (04/13/2023)   PRAPARE - Administrator, Civil Service (Medical): Yes    Lack of Transportation (Non-Medical): Yes  Physical Activity: Not on file  Stress: Not on file  Social Connections: Not on file  Intimate Partner Violence: Not At Risk (01/13/2023)   Humiliation, Afraid, Rape, and Kick questionnaire    Fear of Current or Ex-Partner: No    Emotionally Abused: No    Physically Abused: No    Sexually Abused: No    Physical Exam      Future Appointments  Date Time Provider Department Center  06/25/2023 10:00 AM [facility] ECHO OP 1 [facility]  06/25/2023 11:00 AM Laurey Morale, MD  [facility] None

## 2023-05-03 ENCOUNTER — Telehealth (HOSPITAL_COMMUNITY): Payer: Self-pay | Admitting: Pharmacy Technician

## 2023-05-03 ENCOUNTER — Other Ambulatory Visit (HOSPITAL_COMMUNITY): Payer: Self-pay | Admitting: Pharmacist

## 2023-05-03 ENCOUNTER — Telehealth (HOSPITAL_COMMUNITY): Payer: Self-pay | Admitting: Pharmacist

## 2023-05-03 ENCOUNTER — Other Ambulatory Visit: Payer: Self-pay

## 2023-05-03 ENCOUNTER — Other Ambulatory Visit (HOSPITAL_COMMUNITY): Payer: Self-pay

## 2023-05-03 MED ORDER — VYNDAMAX 61 MG PO CAPS
61.0000 mg | ORAL_CAPSULE | Freq: Every day | ORAL | 11 refills | Status: DC
  Filled 2023-05-03 – 2023-05-04 (×2): qty 30, 30d supply, fill #0
  Filled 2023-05-24: qty 30, 30d supply, fill #1
  Filled 2023-06-24: qty 30, 30d supply, fill #2

## 2023-05-03 NOTE — Telephone Encounter (Signed)
Advanced Heart Failure Patient Advocate Encounter  Left patient vm to start Vyndamax with [facility] specialty pharmacy.   Will follow up.

## 2023-05-03 NOTE — Telephone Encounter (Signed)
Advanced Heart Failure Patient Advocate Encounter  Prior Authorization for Marcus Tran has been approved.    PA# [id number] Effective dates: 05/03/23 through 10/19/23  Patients co-pay is $0.00 (insurance will only allow 30 day supply).  Karle Plumber, PharmD, BCPS, BCCP, CPP Heart Failure Clinic Pharmacist [phone]

## 2023-05-03 NOTE — Telephone Encounter (Signed)
Patient Advocate Encounter   Received notification from [employer] that prior authorization for Vyndamax is required.   PA submitted on CoverMyMeds Key [id number] Status is pending   Will continue to follow.  Karle Plumber, PharmD, BCPS, BCCP, CPP Heart Failure Clinic Pharmacist [phone]

## 2023-05-04 ENCOUNTER — Other Ambulatory Visit (HOSPITAL_COMMUNITY): Payer: Self-pay

## 2023-05-04 ENCOUNTER — Other Ambulatory Visit: Payer: Self-pay

## 2023-05-04 NOTE — Telephone Encounter (Signed)
Advanced Heart Failure Patient Advocate Encounter  Spoke with patient's daughter, Crystal. She is aware that we will mail the Vyndamax to the patient and there is no co-pay at this time. The specialty pharmacy will reach out about 7-10 days prior to discuss adherence and shipment date.  Advised her to call back with any issues.  Archer Asa, CPhT

## 2023-05-05 ENCOUNTER — Other Ambulatory Visit: Payer: Self-pay

## 2023-05-06 ENCOUNTER — Other Ambulatory Visit (HOSPITAL_COMMUNITY): Payer: Self-pay | Admitting: Emergency Medicine

## 2023-05-06 NOTE — Progress Notes (Signed)
Paramedicine Encounter    Patient ID: Marcus Tran, male    DOB: 1951/01/12, 72 y.o.   MRN: [id number]   Complaints NONE  Assessment A&O x 4, skin W&D w/ good color.  Lung sounds clear bilat. No edema noted to extremities.    Compliance with meds YES (on bubble packs)  Pill box filled n/a  Refills needed NONE  Meds changes since last visit:  Should start Vyndamax soon once it arrives by mail.    Social changes NONE   BP 120/72 (BP Location: Right Arm, Patient Position: Sitting, Cuff Size: Normal)   Pulse 70   Resp 16   Wt 147 lb 3.2 oz (66.8 kg)   SpO2 100%   BMI 24.50 kg/m  Weight yesterday-not taken Last visit weight-150lb   Today's visit finds Marcus Tran feeling well.  He has no complaints of chest pain or SOB.  Lung sounds clear and equal bilat.  No edema noted to his extremities.   Discussed with him that he will be receiving a new medication in the mail called Vyndamax and will begin taking 1 capsule daily.   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 Marcus Tran has a prescription for an Immunofixation Urine.  I will attempt to assist him by picking up the testing supplies from [facility] and taking to him to complete.  ACTION: Home visit completed  Bethanie Dicker [phone] 05/06/23  Patient Care Team: Hillery Aldo, NP as PCP - Reatha Armour, MD as PCP - Cardiology (Cardiology)  Patient Active Problem List   Diagnosis Date Noted   Cocaine use 01/15/2023   Noncompliance 01/13/2023   Adenovirus pneumonia 11/12/2022   Chronic HFrEF (heart failure with reduced ejection fraction) (HCC) 08/13/2022   DM2 (diabetes mellitus, type 2) (HCC) 08/13/2022   Obstructive sleep apnea (adult) (pediatric) 08/08/2022   Acute respiratory failure with hypoxia and hypercapnia (HCC) 05/04/2022   Third degree heart block (HCC) 04/15/2022   Snoring 04/15/2022   Nonischemic cardiomyopathy (HCC)  04/15/2022   HFrEF (heart failure with reduced ejection fraction) (HCC) 04/15/2022   COPD with acute exacerbation (HCC) 03/22/2022   Chronic combined systolic and diastolic CHF (congestive heart failure) (HCC)    Tobacco use disorder 03/08/2022   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 Swallow whole., Disp: 90 tablet, Rfl: 3   atorvastatin (LIPITOR) 80 MG tablet, TAKE 1 Tablet BY MOUTH ONCE DAILY EVERY EVENING, Disp: 90 tablet, Rfl: 3   bisoprolol (ZEBETA) 5 MG tablet, TAKE 1 Tablet BY MOUTH ONCE DAILY, Disp: 30 tablet, Rfl: 6   FARXIGA 10 MG TABS tablet, TAKE 1 Tablet BY MOUTH ONCE DAILY (MORNING), Disp: 30 tablet, Rfl: 5   furosemide (LASIX) 40 MG tablet, TAKE 1 Tablet BY MOUTH TWICE DAILY (morning AND evening), Disp: 180 tablet, Rfl: 3   ipratropium-albuterol (DUONEB) 0.5-2.5 (3) MG/3ML SOLN, Inhale 3 mLs into the lungs in the morning, at noon, in the evening, and at bedtime., Disp: 360 mL, Rfl: 11   isosorbide-hydrALAZINE (  BIDIL) 20-37.5 MG tablet, Take 1 tablet by mouth 3 (three) times daily., Disp: 90 tablet, Rfl: 3   ketoconazole (NIZORAL) 2 % cream, Apply 1 Application topically daily as needed for irritation., Disp: , Rfl:    metFORMIN (GLUCOPHAGE) 500 MG tablet, Take 1 tablet (500 mg total) by mouth 2 (two) times daily with a meal., Disp: 60 tablet, Rfl: 0   montelukast (SINGULAIR) 10 MG tablet, TAKE 1 Tablet BY MOUTH ONCE DAILY at bedtime (evening), Disp: 30 tablet, Rfl: 11   sacubitril-valsartan (ENTRESTO) 24-26 MG, Take 1 tablet by mouth 2 (two) times daily., Disp: 60  tablet, Rfl: 11   Tafamidis (VYNDAMAX) 61 MG CAPS, Take 1 capsule (61 mg total) by mouth daily., Disp: 30 capsule, Rfl: 11   TRELEGY ELLIPTA 100-62.5-25 MCG/ACT AEPB, Take 1 puff by mouth daily., Disp: , Rfl:  No Known Allergies   Social History   Socioeconomic History   Marital status: Single    Spouse name: Not on file   Number of children: 1   Years of education: Not on file   Highest education level: 10th grade  Occupational History   Occupation: Disability  Tobacco Use   Smoking status: Some Days    Current packs/day: 0.00    Average packs/day: 0.5 packs/day for 50.0 years (25.0 ttl pk-yrs)    Types: Cigarettes    Start date: 03/19/1972    Last attempt to quit: 03/19/2022    Years since quitting: 1.1   Smokeless tobacco: Never   Tobacco comments:    Smoking cessation  Vaping Use   Vaping status: Never Used  Substance and Sexual Activity   Alcohol use: Not on file    Comment: socially   Drug use: Yes    Types: Marijuana    Comment: past   Sexual activity: Not on file  Other Topics Concern   Not on file  Social History Narrative   He lives with his daughter apparently and 5 grandchildren.  He reports that he smokes probably less than a pack of cigarettes a day.  He occasionally drinks a beer or liquor but not daily.   Social Determinants of Health   Financial Resource Strain: Low Risk  (03/11/2022)   Overall Financial Resource Strain (CARDIA)    Difficulty of Paying Living Expenses: Not very hard  Food Insecurity: No Food Insecurity (01/13/2023)   Hunger Vital Sign    Worried About Running Out of Food in the Last Year: Never true    Ran Out of Food in the Last Year: Never true  Recent Concern: Food Insecurity - Food Insecurity Present (10/26/2022)   Hunger Vital Sign    Worried About Running Out of Food in the Last Year: Often true    Ran Out of Food in the Last Year: Often true  Transportation Needs: Unmet Transportation Needs (04/13/2023)   PRAPARE - Therapist, art (Medical): Yes    Lack of Transportation (Non-Medical): Yes  Physical Activity: Not on file  Stress: Not on file  Social Connections: Not on file  Intimate Partner Violence: Not At Risk (01/13/2023)   Humiliation, Afraid, Rape, and Kick questionnaire    Fear of Current or Ex-Partner: No    Emotionally Abused: No    Physically Abused: No    Sexually Abused: No    Physical Exam      Future Appointments  Date Time Provider Department Center  06/25/2023 10:00 AM [facility] 1 [facility]  06/25/2023 11:00  AM Laurey Morale, MD [facility] None

## 2023-05-07 ENCOUNTER — Telehealth (HOSPITAL_COMMUNITY): Payer: Self-pay | Admitting: Emergency Medicine

## 2023-05-07 NOTE — Telephone Encounter (Signed)
Returned call to [name] who had called earlier to advise that he had received the Vyndamax in the mail but was unclear as to whether he was supposed to take it or not.  I explained to him that this was the medication I had discussed with him at yesterday's home visit and that he is to take 1 pill daily.  He advised me he will take one now and from now on will take 1 daily with his morning meds from his bubble pack. I also advised him that I would pick up his Immunofixation Urine test from [facility] and drop it off next Tuesday.  I explained that this is a 24 hour urine test and when he is finished w/ specimen collection that Crystal, his daughter; will need to deliver it back to [employer] [employer] and both pt and daughter understood same.    Beatrix Shipper, EMT-Paramedic [phone] 05/07/2023

## 2023-05-10 ENCOUNTER — Other Ambulatory Visit (HOSPITAL_COMMUNITY): Payer: Self-pay | Admitting: Cardiology

## 2023-05-13 ENCOUNTER — Telehealth (HOSPITAL_COMMUNITY): Payer: Self-pay | Admitting: Emergency Medicine

## 2023-05-13 NOTE — Telephone Encounter (Signed)
Called to discuss the steps needed to complete his 24hr. Urine testing.  His daughter Crystal agreed to deliver the completed specimen test to [facility].  I sent the address to her via text with instructions to call me should she have questions.    Beatrix Shipper, EMT-Paramedic [phone] 05/13/2023

## 2023-05-14 ENCOUNTER — Other Ambulatory Visit (HOSPITAL_COMMUNITY): Payer: Self-pay | Admitting: Emergency Medicine

## 2023-05-14 ENCOUNTER — Other Ambulatory Visit (HOSPITAL_COMMUNITY): Payer: Self-pay | Admitting: Cardiology

## 2023-05-14 NOTE — Progress Notes (Unsigned)
Paramedicine Encounter    Patient ID: Marcus Tran, male    DOB: 17-Dec-1950, 72 y.o.   MRN: [id number]   Complaints***  Assessment***  Compliance with meds***  Pill box filled***  Refills needed***  Meds changes since last visit***    Social changes***   There were no vitals taken for this visit. Weight yesterday-Not taken Last visit weight-147lb  ACTION: {Paramed Action:[phone]}  Beatrix Shipper, EMT-Paramedic [phone] 05/14/23  Patient Care Team: Hillery Aldo, NP as PCP - Reatha Armour, MD as PCP - Cardiology (Cardiology)  Patient Active Problem List   Diagnosis Date Noted   Cocaine use 01/15/2023   Noncompliance 01/13/2023   Adenovirus pneumonia 11/12/2022   Chronic HFrEF (heart failure with reduced ejection fraction) (HCC) 08/13/2022   DM2 (diabetes mellitus, type 2) (HCC) 08/13/2022   Obstructive sleep apnea (adult) (pediatric) 08/08/2022   Acute respiratory failure with hypoxia and hypercapnia (HCC) 05/04/2022   Third degree heart block (HCC) 04/15/2022   Snoring 04/15/2022   Nonischemic cardiomyopathy (HCC) 04/15/2022   HFrEF (heart failure with reduced ejection fraction) (HCC) 04/15/2022   COPD with acute exacerbation (HCC) 03/22/2022   Chronic combined systolic and diastolic CHF (congestive heart failure) (HCC)    Tobacco use disorder 03/08/2022   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 Swallow whole., Disp: 90 tablet, Rfl: 3   atorvastatin (LIPITOR) 80 MG tablet, TAKE 1 Tablet BY MOUTH ONCE DAILY EVERY EVENING, Disp: 90  tablet, Rfl: 3   bisoprolol (ZEBETA) 5 MG tablet, TAKE 1 Tablet BY MOUTH ONCE DAILY, Disp: 30 tablet, Rfl: 6   FARXIGA 10 MG TABS tablet, TAKE 1 Tablet BY MOUTH ONCE DAILY (MORNING), Disp: 30 tablet, Rfl: 5   furosemide (LASIX) 40 MG tablet, TAKE 1 Tablet BY MOUTH TWICE DAILY (morning AND evening), Disp: 180 tablet, Rfl: 3   ipratropium-albuterol (DUONEB) 0.5-2.5 (3) MG/3ML SOLN, Inhale 3 mLs into the lungs in the morning, at noon, in the evening, and at bedtime., Disp: 360 mL, Rfl: 11   isosorbide-hydrALAZINE (BIDIL) 20-37.5 MG tablet, Take 1 tablet by mouth 3 (three) times daily., Disp: 90 tablet, Rfl: 3   ketoconazole (NIZORAL) 2 % cream, Apply 1 Application topically daily as needed for irritation., Disp: , Rfl:    metFORMIN (GLUCOPHAGE) 500 MG tablet, Take 1 tablet (500 mg total) by mouth 2 (two) times daily with a meal., Disp: 60 tablet, Rfl: 0   montelukast (SINGULAIR) 10 MG tablet, TAKE 1 Tablet BY MOUTH ONCE DAILY at bedtime (evening), Disp: 30 tablet, Rfl: 11   sacubitril-valsartan (ENTRESTO) 24-26 MG, Take 1 tablet by mouth 2 (two) times daily., Disp: 60 tablet, Rfl: 11   Tafamidis (VYNDAMAX) 61 MG CAPS, Take 1 capsule (61 mg total) by mouth daily., Disp: 30 capsule, Rfl: 11   TRELEGY ELLIPTA 100-62.5-25 MCG/ACT AEPB, Take 1 puff by mouth daily., Disp: , Rfl:  No Known Allergies   Social History   Socioeconomic History   Marital status: Single    Spouse name: Not on file   Number of children: 1   Years of  education: Not on file   Highest education level: 10th grade  Occupational History   Occupation: Disability  Tobacco Use   Smoking status: Some Days    Current packs/day: 0.00    Average packs/day: 0.5 packs/day for 50.0 years (25.0 ttl pk-yrs)    Types: Cigarettes    Start date: 03/19/1972    Last attempt to quit: 03/19/2022    Years since quitting: 1.1   Smokeless tobacco: Never   Tobacco comments:    Smoking cessation  Vaping Use   Vaping status: Never Used  Substance  and Sexual Activity   Alcohol use: Not on file    Comment: socially   Drug use: Yes    Types: Marijuana    Comment: past   Sexual activity: Not on file  Other Topics Concern   Not on file  Social History Narrative   He lives with his daughter apparently and 5 grandchildren.  He reports that he smokes probably less than a pack of cigarettes a day.  He occasionally drinks a beer or liquor but not daily.   Social Determinants of Health   Financial Resource Strain: Low Risk  (03/11/2022)   Overall Financial Resource Strain (CARDIA)    Difficulty of Paying Living Expenses: Not very hard  Food Insecurity: No Food Insecurity (01/13/2023)   Hunger Vital Sign    Worried About Running Out of Food in the Last Year: Never true    Ran Out of Food in the Last Year: Never true  Recent Concern: Food Insecurity - Food Insecurity Present (10/26/2022)   Hunger Vital Sign    Worried About Running Out of Food in the Last Year: Often true    Ran Out of Food in the Last Year: Often true  Transportation Needs: Unmet Transportation Needs (04/13/2023)   PRAPARE - Administrator, Civil Service (Medical): Yes    Lack of Transportation (Non-Medical): Yes  Physical Activity: Not on file  Stress: Not on file  Social Connections: Not on file  Intimate Partner Violence: Not At Risk (01/13/2023)   Humiliation, Afraid, Rape, and Kick questionnaire    Fear of Current or Ex-Partner: No    Emotionally Abused: No    Physically Abused: No    Sexually Abused: No    Physical Exam      Future Appointments  Date Time Provider Department Center  06/25/2023 10:00 AM [facility] OP 1 [facility]  06/25/2023 11:00 AM Laurey Morale, MD [facility] None

## 2023-05-21 ENCOUNTER — Other Ambulatory Visit (HOSPITAL_COMMUNITY): Payer: Self-pay

## 2023-05-21 ENCOUNTER — Telehealth: Payer: Self-pay | Admitting: Cardiology

## 2023-05-21 MED ORDER — BISOPROLOL FUMARATE 5 MG PO TABS: 5.0000 mg | ORAL_TABLET | Freq: Every day | ORAL | 3 refills | Status: DC

## 2023-05-21 NOTE — Telephone Encounter (Signed)
This is a CHF pt last seen in July 2024. Please address

## 2023-05-21 NOTE — Telephone Encounter (Signed)
*  STAT* If patient is at the pharmacy, call can be transferred to refill team.   1. Which medications need to be refilled? (please list name of each medication and dose if known) bisoprolol (ZEBETA) 5 MG tablet  2. Which pharmacy/location (including street and city if local pharmacy) is medication to be sent to? [facility] - [place], [place] - [address] [facility]    3. Do they need a 30 day or 90 day supply? 30 Day Supply

## 2023-05-24 ENCOUNTER — Other Ambulatory Visit: Payer: Self-pay | Admitting: Pulmonary Disease

## 2023-05-24 ENCOUNTER — Other Ambulatory Visit (HOSPITAL_COMMUNITY): Payer: Self-pay

## 2023-05-27 ENCOUNTER — Other Ambulatory Visit (HOSPITAL_COMMUNITY): Payer: Self-pay

## 2023-05-27 ENCOUNTER — Other Ambulatory Visit (HOSPITAL_COMMUNITY): Payer: Self-pay | Admitting: Emergency Medicine

## 2023-05-27 NOTE — Progress Notes (Signed)
Paramedicine Encounter    Patient ID: Marcus Tran, male    DOB: 1951/04/30, 72 y.o.   MRN: [id number]   Complaints NONE  Assessment A&O x 4, skin W&D w/ good color.  Compliance with meds YES  Pill box filled N/A Bubble Packs  Refills needed Vyndamax  Meds changes since last visit NONE    Social changes NONE  Bubble packs to be delivered today BP 100/60 (BP Location: Left Arm, Patient Position: Sitting, Cuff Size: Normal)   Pulse 68   Resp 14   Wt 148 lb 3.2 oz (67.2 kg)   SpO2 95%   BMI 24.66 kg/m  Weight yesterday- Not Taken Last visit weight-149lb  [name] reporting to be doing well.  He is doing well with his bubble pack and has not missed any doses.  He denies chest pain or SOB.  No dizziness noted.  No nausea or vomiting and bowel movements normal.  Pt's refill on bubble packs arrived during our visit.   Pt. Asked me to help him find out about an eye appointment he needed.  Pt. Was referred to [facility] and I was able to confirm his appointment for 8/16 @ 10:00  ACTION: Home visit completed  Bethanie Dicker [phone] 05/27/23  Patient Care Team: Hillery Aldo, NP as PCP - Reatha Armour, MD as PCP - Cardiology (Cardiology)  Patient Active Problem List   Diagnosis Date Noted   Cocaine use 01/15/2023   Noncompliance 01/13/2023   Adenovirus pneumonia 11/12/2022   Chronic HFrEF (heart failure with reduced ejection fraction) (HCC) 08/13/2022   DM2 (diabetes mellitus, type 2) (HCC) 08/13/2022   Obstructive sleep apnea (adult) (pediatric) 08/08/2022   Acute respiratory failure with hypoxia and hypercapnia (HCC) 05/04/2022   Third degree heart block (HCC) 04/15/2022   Snoring 04/15/2022   Nonischemic cardiomyopathy (HCC) 04/15/2022   HFrEF (heart failure with reduced ejection fraction) (HCC) 04/15/2022   COPD with acute exacerbation (HCC) 03/22/2022   Chronic combined systolic and diastolic CHF (congestive heart failure)  (HCC)    Tobacco use disorder 03/08/2022   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 Swallow whole., Disp: 90 tablet, Rfl: 3   atorvastatin (LIPITOR) 80 MG tablet, TAKE 1 Tablet BY MOUTH ONCE DAILY EVERY EVENING, Disp: 90 tablet, Rfl: 3   bisoprolol (ZEBETA) 5 MG tablet, Take 1 tablet (5 mg total) by mouth daily., Disp: 90 tablet, Rfl: 3   FARXIGA 10 MG TABS tablet, TAKE 1 Tablet BY MOUTH ONCE DAILY (MORNING), Disp: 30 tablet, Rfl: 5   furosemide (LASIX) 40 MG tablet, TAKE 1 Tablet BY MOUTH TWICE DAILY (morning AND evening), Disp: 180 tablet, Rfl: 3   ipratropium-albuterol (DUONEB) 0.5-2.5 (3) MG/3ML SOLN, Inhale 3 mLs into the lungs in the morning, at noon, in the evening, and at bedtime., Disp: 360 mL, Rfl: 11   isosorbide-hydrALAZINE (BIDIL) 20-37.5 MG tablet, Take 1 tablet by mouth 3 (three) times daily., Disp: 90 tablet, Rfl: 3   ketoconazole (NIZORAL) 2 % cream, Apply 1 Application topically daily as needed  for irritation., Disp: , Rfl:    metFORMIN (GLUCOPHAGE) 500 MG tablet, Take 1 tablet (500 mg total) by mouth 2 (two) times daily with a meal., Disp: 60 tablet, Rfl: 0   montelukast (SINGULAIR) 10 MG tablet, TAKE 1 Tablet BY MOUTH ONCE DAILY at bedtime (evening), Disp: 30 tablet, Rfl: 11   sacubitril-valsartan (ENTRESTO) 24-26 MG, Take 1 tablet by mouth 2 (two) times daily., Disp: 60 tablet, Rfl: 11   Tafamidis (VYNDAMAX) 61 MG CAPS, Take 1 capsule (61 mg total) by mouth daily., Disp: 30 capsule, Rfl: 11   TRELEGY ELLIPTA 100-62.5-25 MCG/ACT AEPB, Take 1 puff by mouth daily.,  Disp: , Rfl:  No Known Allergies   Social History   Socioeconomic History   Marital status: Single    Spouse name: Not on file   Number of children: 1   Years of education: Not on file   Highest education level: 10th grade  Occupational History   Occupation: Disability  Tobacco Use   Smoking status: Some Days    Current packs/day: 0.00    Average packs/day: 0.5 packs/day for 50.0 years (25.0 ttl pk-yrs)    Types: Cigarettes    Start date: 03/19/1972    Last attempt to quit: 03/19/2022    Years since quitting: 1.1   Smokeless tobacco: Never   Tobacco comments:    Smoking cessation  Vaping Use   Vaping status: Never Used  Substance and Sexual Activity   Alcohol use: Not on file    Comment: socially   Drug use: Yes    Types: Marijuana    Comment: past   Sexual activity: Not on file  Other Topics Concern   Not on file  Social History Narrative   He lives with his daughter apparently and 5 grandchildren.  He reports that he smokes probably less than a pack of cigarettes a day.  He occasionally drinks a beer or liquor but not daily.   Social Determinants of Health   Financial Resource Strain: Low Risk  (03/11/2022)   Overall Financial Resource Strain (CARDIA)    Difficulty of Paying Living Expenses: Not very hard  Food Insecurity: No Food Insecurity (01/13/2023)   Hunger Vital Sign    Worried About Running Out of Food in the Last Year: Never true    Ran Out of Food in the Last Year: Never true  Recent Concern: Food Insecurity - Food Insecurity Present (10/26/2022)   Hunger Vital Sign    Worried About Running Out of Food in the Last Year: Often true    Ran Out of Food in the Last Year: Often true  Transportation Needs: Unmet Transportation Needs (04/13/2023)   PRAPARE - Administrator, Civil Service (Medical): Yes    Lack of Transportation (Non-Medical): Yes  Physical Activity: Not on file  Stress: Not on file  Social Connections: Not on file  Intimate Partner  Violence: Not At Risk (01/13/2023)   Humiliation, Afraid, Rape, and Kick questionnaire    Fear of Current or Ex-Partner: No    Emotionally Abused: No    Physically Abused: No    Sexually Abused: No    Physical Exam      Future Appointments  Date Time Provider Department Center  06/25/2023 10:00 AM [facility] OP 1 [facility]  06/25/2023 11:00 AM Laurey Morale, MD [facility] None

## 2023-06-01 ENCOUNTER — Other Ambulatory Visit: Payer: Self-pay

## 2023-06-10 ENCOUNTER — Other Ambulatory Visit (HOSPITAL_COMMUNITY): Payer: Self-pay | Admitting: Emergency Medicine

## 2023-06-10 NOTE — Progress Notes (Signed)
Paramedicine Encounter    Patient ID: Marcus Tran, male    DOB: [birth year], 72 y.o.   MRN: [id number]   Complaints Rash w/ itching to both arms, legs & back  Assessment A&O x 4, skin W&D w/ good color.  Lung sound clear throughout.  No peripheral edema noted.  Compliance with meds Yes- bubble packs  Pill box filled n/a  Refills needed NONE  Meds changes since last visit started Vyndamax    Social changes NONE   BP 108/70 (BP Location: Left Arm, Patient Position: Standing, Cuff Size: Normal)   Pulse 70   Resp 14   Wt 151 lb 3.2 oz (68.6 kg)   SpO2 96%   BMI 25.16 kg/m  Weight yesterday-not taken Last visit weight-148lb   Marcus Tran was outside sitting with his grandson on my arrival.  A&O x 4, skin W&D w/ good color.  Marcus Tran complains today about a rash to arms, legs & back that is itching.  He has been treating with OTC Benadryl and Hydorcortisone cream w/o relief.  He states he has not changed detergents, deodorant or anything.  He correlates this rash starting around the same time he started taking his Vyndamax.  I reached out to Karle Plumber @ [facility] and she suggested he d/c the Vyndamax 1-2 weeks to see if the rash clears up.  He will discontinue this med starting tomorrow. Otherwise pt reports to be doing well.  No issues with chest pain or SOB. No dizziness, nausea or vomiting. Bowel movements normal.   Marcus Tran [facility] for [facility] 9/3 @ 2:00.  Eileen Stanford will assist w/ setting up transportation.  ACTION: Home visit completed  Bethanie Dicker [phone] 06/10/23  Patient Care Team: Hillery Aldo, NP as PCP - Reatha Armour, MD as PCP - Cardiology (Cardiology)  Patient Active Problem List   Diagnosis Date Noted   Cocaine use 01/15/2023   Noncompliance 01/13/2023   Adenovirus pneumonia 11/12/2022   Chronic HFrEF (heart failure with reduced ejection fraction) (HCC) 08/13/2022   DM2 (diabetes mellitus, type  2) (HCC) 08/13/2022   Obstructive sleep apnea (adult) (pediatric) 08/08/2022   Acute respiratory failure with hypoxia and hypercapnia (HCC) 05/04/2022   Third degree heart block (HCC) 04/15/2022   Snoring 04/15/2022   Nonischemic cardiomyopathy (HCC) 04/15/2022   HFrEF (heart failure with reduced ejection fraction) (HCC) 04/15/2022   COPD with acute exacerbation (HCC) 03/22/2022   Chronic combined systolic and diastolic CHF (congestive heart failure) (HCC)    Tobacco use disorder 03/08/2022   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 Swallow whole., Disp: 90 tablet, Rfl: 3   atorvastatin (LIPITOR) 80 MG tablet, TAKE 1 Tablet BY MOUTH ONCE DAILY EVERY EVENING, Disp: 90 tablet, Rfl: 3   bisoprolol (ZEBETA) 5 MG tablet, Take 1 tablet (5 mg total) by mouth daily., Disp: 90 tablet, Rfl: 3   FARXIGA 10 MG TABS tablet, TAKE 1 Tablet BY MOUTH ONCE DAILY (MORNING), Disp: 30 tablet, Rfl: 5   furosemide (LASIX) 40 MG  tablet, TAKE 1 Tablet BY MOUTH TWICE DAILY (morning AND evening), Disp: 180 tablet, Rfl: 3   ipratropium-albuterol (DUONEB) 0.5-2.5 (3) MG/3ML SOLN, Inhale 3 mLs into the lungs in the morning, at noon, in the evening, and at bedtime., Disp: 360 mL, Rfl: 11   isosorbide-hydrALAZINE (BIDIL) 20-37.5 MG tablet, Take 1 tablet by mouth 3 (three) times daily., Disp: 90 tablet, Rfl: 3   ketoconazole (NIZORAL) 2 % cream, Apply 1 Application topically daily as needed for irritation., Disp: , Rfl:    metFORMIN (GLUCOPHAGE) 500 MG tablet, Take 1 tablet (500 mg total) by mouth 2 (two) times  daily with a meal., Disp: 60 tablet, Rfl: 0   montelukast (SINGULAIR) 10 MG tablet, TAKE 1 Tablet BY MOUTH ONCE DAILY at bedtime (evening), Disp: 30 tablet, Rfl: 11   sacubitril-valsartan (ENTRESTO) 24-26 MG, Take 1 tablet by mouth 2 (two) times daily., Disp: 60 tablet, Rfl: 11   Tafamidis (VYNDAMAX) 61 MG CAPS, Take 1 capsule (61 mg total) by mouth daily., Disp: 30 capsule, Rfl: 11   TRELEGY ELLIPTA 100-62.5-25 MCG/ACT AEPB, Take 1 puff by mouth daily., Disp: , Rfl:  No Known Allergies   Social History   Socioeconomic History   Marital status: Single    Spouse name: Not on file   Number of children: 1   Years of education: Not on file   Highest education level: 10th grade  Occupational History   Occupation: Disability  Tobacco Use   Smoking status: Some Days    Current packs/day: 0.00    Average packs/day: 0.5 packs/day for 50.0 years (25.0 ttl pk-yrs)    Types: Cigarettes    Start date: 03/19/1972    Last attempt to quit: 03/19/2022    Years since quitting: 1.2   Smokeless tobacco: Never   Tobacco comments:    Smoking cessation  Vaping Use   Vaping status: Never Used  Substance and Sexual Activity   Alcohol use: Not on file    Comment: socially   Drug use: Yes    Types: Marijuana    Comment: past   Sexual activity: Not on file  Other Topics Concern   Not on file  Social History Narrative   He lives with his daughter apparently and 5 grandchildren.  He reports that he smokes probably less than a pack of cigarettes a day.  He occasionally drinks a beer or liquor but not daily.   Social Determinants of Health   Financial Resource Strain: Low Risk  (03/11/2022)   Overall Financial Resource Strain (CARDIA)    Difficulty of Paying Living Expenses: Not very hard  Food Insecurity: No Food Insecurity (01/13/2023)   Hunger Vital Sign    Worried About Running Out of Food in the Last Year: Never true    Ran Out of Food in the Last Year: Never true  Recent Concern: Food Insecurity  - Food Insecurity Present (10/26/2022)   Hunger Vital Sign    Worried About Programme researcher, broadcasting/film/video in the Last Year: Often true    Ran Out of Food in the Last Year: Often true  Transportation Needs: Unmet Transportation Needs (04/13/2023)   PRAPARE - Administrator, Civil Service (Medical): Yes    Lack of Transportation (Non-Medical): Yes  Physical Activity: Not on file  Stress: Not on file  Social Connections: Not on file  Intimate Partner Violence: Not At Risk (01/13/2023)   Humiliation, Afraid, Rape, and Kick questionnaire    Fear of Current or Ex-Partner: No  Emotionally Abused: No    Physically Abused: No    Sexually Abused: No    Physical Exam      Future Appointments  Date Time Provider Department Center  06/22/2023  2:00 PM [facility] ECHO OP 1 [facility] [place]  06/22/2023  3:00 PM Laurey Morale, MD [facility] None  06/29/2023  8:00 PM Quintella Reichert, MD [facility]

## 2023-06-17 ENCOUNTER — Other Ambulatory Visit (HOSPITAL_COMMUNITY): Payer: Self-pay | Admitting: Emergency Medicine

## 2023-06-17 ENCOUNTER — Other Ambulatory Visit (HOSPITAL_COMMUNITY): Payer: Self-pay

## 2023-06-17 ENCOUNTER — Telehealth (HOSPITAL_COMMUNITY): Payer: Self-pay | Admitting: Emergency Medicine

## 2023-06-17 ENCOUNTER — Telehealth (HOSPITAL_COMMUNITY): Payer: Self-pay | Admitting: Cardiology

## 2023-06-17 DIAGNOSIS — I5020 Unspecified systolic (congestive) heart failure: Secondary | ICD-10-CM

## 2023-06-17 NOTE — Telephone Encounter (Signed)
-----   Message from CMA Dede S sent at 06/17/2023 12:32 PM EDT ----- Regarding: low blood pressure Home visit with Mr. Revers finds him hypotensive @ 80/60.  No orthostatic changes.  No complaints of dizziness or weakness.  He is compliant w/ meds in bubble packs.  No chest pain or SOB.  No edema noted.   Please advise if any changes needed for this pt.  He does have an upcoming ECHO on 9/6.  Thanks so much,    Beatrix Shipper, EMT-Paramedic [phone] 06/17/2023
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
## 2023-06-17 NOTE — Telephone Encounter (Signed)
Contacted [facility] and to reschedule pt's ECHO.  He advised that he would be out of town on 9/3.  Rescheduled for 10/25 @ 11:00 for the ECHO and 12:00 w/ Dr. Shirlee Latch.    Beatrix Shipper, EMT-Paramedic [phone] 06/17/2023

## 2023-06-18 MED ORDER — ISOSORB DINITRATE-HYDRALAZINE 20-37.5 MG PO TABS
0.5000 | ORAL_TABLET | Freq: Three times a day (TID) | ORAL | 3 refills | Status: DC
Start: 2023-06-18 — End: 2023-10-22

## 2023-06-18 NOTE — Telephone Encounter (Signed)
Message to para medicine to assist with med changes and note recent weights.   Labs 9/10 @ 1230 Meds list update to reflect changes

## 2023-06-22 ENCOUNTER — Encounter (HOSPITAL_COMMUNITY): Payer: 59 | Admitting: Cardiology

## 2023-06-22 ENCOUNTER — Ambulatory Visit (HOSPITAL_COMMUNITY): Payer: 59

## 2023-06-23 ENCOUNTER — Other Ambulatory Visit (HOSPITAL_COMMUNITY): Payer: Self-pay | Admitting: Emergency Medicine

## 2023-06-23 NOTE — Progress Notes (Signed)
Paramedicine Encounter    Patient ID: Marcus Tran, male    DOB: 06/24/1951, 72 y.o.   MRN: [id number]   Complaints NONE  Assessment A&O x 4, skin W&D w/ good color.    Compliance with meds YES  Pill box filled n/a  Refills needed NONE  Meds changes since last visit decreased Bidil Dose to 1/2 tablet TID.     Social changes NONE   BP 110/80 (BP Location: Right Arm, Patient Position: Sitting, Cuff Size: Normal)   Pulse 70   Resp 14  Weight yesterday-not taken  Last visit weight-149lb  This was not a scheduled visit.  Tran went to assist w/ med change of Bidil to 1/2 tablet TID due to soft BP on last home visit.   He had lost his pill cutter and I assisted him with mine.  I gave him an empty pill bottle to put the other half of the tablet in.   Marcus Tran and had his eyes dilated and was having difficulty seeing.  He gave me some paperwork that he had received regarding his upcoming Sleep Study appointment on 9/10.  I assisted him in filling out the paperwork w/ his med list and some of the other questions on the sheet.  I explained to him that the paper states if he is a no-show for this appointment he will be charged $200.00 and will not be able to reschedule this appointment until the bill is paid.   Will work on getting transportation set up - he has lab work on 9/10 as well as the sleep study.  ACTION: Home visit completed  Bethanie Dicker [phone] 06/23/23  Patient Care Team: Hillery Aldo, NP as PCP - Reatha Armour, MD as PCP - Cardiology (Cardiology)  Patient Active Problem List   Diagnosis Date Noted   Cocaine use 01/15/2023   Noncompliance 01/13/2023   Adenovirus pneumonia 11/12/2022   Chronic HFrEF (heart failure with reduced ejection fraction) (HCC) 08/13/2022   DM2 (diabetes mellitus, type 2) (HCC) 08/13/2022   Obstructive sleep apnea (adult) (pediatric) 08/08/2022   Acute respiratory failure with hypoxia  and hypercapnia (HCC) 05/04/2022   Third degree heart block (HCC) 04/15/2022   Snoring 04/15/2022   Nonischemic cardiomyopathy (HCC) 04/15/2022   HFrEF (heart failure with reduced ejection fraction) (HCC) 04/15/2022   COPD with acute exacerbation (HCC) 03/22/2022   Chronic combined systolic and diastolic CHF (congestive heart failure) (HCC)    Tobacco use disorder 03/08/2022   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 Swallow whole., Disp: 90 tablet, Rfl: 3   atorvastatin (LIPITOR) 80 MG tablet, TAKE 1 Tablet BY MOUTH ONCE DAILY EVERY EVENING, Disp: 90 tablet, Rfl: 3   bisoprolol (ZEBETA) 5 MG tablet, Take 1 tablet (5 mg total) by mouth daily., Disp: 90 tablet, Rfl: 3   FARXIGA 10 MG TABS tablet, TAKE 1 Tablet BY MOUTH ONCE DAILY (MORNING), Disp: 30 tablet, Rfl: 5   furosemide (LASIX) 40 MG tablet, TAKE 1 Tablet BY MOUTH TWICE DAILY (morning AND evening),  Disp: 180 tablet, Rfl: 3   ipratropium-albuterol (DUONEB) 0.5-2.5 (3) MG/3ML SOLN, Inhale 3 mLs into the lungs in the morning, at noon, in the evening, and at bedtime., Disp: 360 mL, Rfl: 11   isosorbide-hydrALAZINE (BIDIL) 20-37.5 MG tablet, Take 0.5 tablets by mouth 3 (three) times daily., Disp: 45 tablet, Rfl: 3   ketoconazole (NIZORAL) 2 % cream, Apply 1 Application topically daily as needed for irritation., Disp: , Rfl:    metFORMIN (GLUCOPHAGE) 500 MG tablet, Take 1 tablet (500 mg total) by mouth 2 (two) times daily with a meal., Disp: 60 tablet, Rfl: 0   montelukast (SINGULAIR) 10 MG tablet, TAKE 1 Tablet BY MOUTH ONCE DAILY  at bedtime (evening), Disp: 30 tablet, Rfl: 11   sacubitril-valsartan (ENTRESTO) 24-26 MG, Take 1 tablet by mouth 2 (two) times daily., Disp: 60 tablet, Rfl: 11   Tafamidis (VYNDAMAX) 61 MG CAPS, Take 1 capsule (61 mg total) by mouth daily., Disp: 30 capsule, Rfl: 11   TRELEGY ELLIPTA 100-62.5-25 MCG/ACT AEPB, Take 1 puff by mouth daily., Disp: , Rfl:  No Known Allergies   Social History   Socioeconomic History   Marital status: Single    Spouse name: Not on file   Number of children: 1   Years of education: Not on file   Highest education level: 10th grade  Occupational History   Occupation: Disability  Tobacco Use   Smoking status: Some Days    Current packs/day: 0.00    Average packs/day: 0.5 packs/day for 50.0 years (25.0 ttl pk-yrs)    Types: Cigarettes    Start date: 03/19/1972    Last attempt to quit: 03/19/2022    Years since quitting: 1.2   Smokeless tobacco: Never   Tobacco comments:    Smoking cessation  Vaping Use   Vaping status: Never Used  Substance and Sexual Activity   Alcohol use: Not on file    Comment: socially   Drug use: Yes    Types: Marijuana    Comment: past   Sexual activity: Not on file  Other Topics Concern   Not on file  Social History Narrative   He lives with his daughter apparently and 5 grandchildren.  He reports that he smokes probably less than a pack of cigarettes a day.  He occasionally drinks a beer or liquor but not daily.   Social Determinants of Health   Financial Resource Strain: Low Risk  (03/11/2022)   Overall Financial Resource Strain (CARDIA)    Difficulty of Paying Living Expenses: Not very hard  Food Insecurity: No Food Insecurity (01/13/2023)   Hunger Vital Sign    Worried About Running Out of Food in the Last Year: Never true    Ran Out of Food in the Last Year: Never true  Recent Concern: Food Insecurity - Food Insecurity Present (10/26/2022)   Hunger Vital Sign    Worried About Programme researcher, broadcasting/film/video in the Last Year: Often  true    Ran Out of Food in the Last Year: Often true  Transportation Needs: Unmet Transportation Needs (04/13/2023)   PRAPARE - Administrator, Civil Service (Medical): Yes    Lack of Transportation (Non-Medical): Yes  Physical Activity: Not on file  Stress: Not on file  Social Connections: Not on file  Intimate Partner Violence: Not At Risk (01/13/2023)   Humiliation, Afraid, Rape, and Kick questionnaire    Fear of Current or Ex-Partner: No    Emotionally Abused: No    Physically Abused:  No    Sexually Abused: No    Physical Exam      Future Appointments  Date Time Provider Department Center  06/29/2023 12:30 PM [facility] None  06/29/2023  8:00 PM Quintella Reichert, MD [facility]  08/13/2023 11:00 AM [facility] ECHO OP 1 [facility]  08/13/2023 12:00 PM Laurey Morale, MD [facility] None

## 2023-06-24 ENCOUNTER — Other Ambulatory Visit (HOSPITAL_COMMUNITY): Payer: Self-pay

## 2023-06-24 ENCOUNTER — Telehealth (HOSPITAL_COMMUNITY): Payer: Self-pay | Admitting: Licensed Clinical Social Worker

## 2023-06-24 ENCOUNTER — Telehealth (HOSPITAL_COMMUNITY): Payer: Self-pay | Admitting: Emergency Medicine

## 2023-06-24 NOTE — Telephone Encounter (Signed)
Called and spoke with Crystal today about Mr. Orvan July upcoming sleep study appointment.  I advised her that she needs to take him to his sleep study appointment 9/10 @ 8:00 p.m.  and pick him up the morning of 9/11 around 6:00I made her aware that it is very important that he make it to this appointment and that if he no-shows he will receive a $200 fee and that it would have to be paid before he would be able to schedule another sleep study.

## 2023-06-24 NOTE — Telephone Encounter (Signed)
H&V Care Navigation CSW Progress Note  Clinical Social Worker informed pt needs ride to appt on 9/10 for labs.  CSW attempted to arrange through pt Medicaid but they informed me he is not active despite showing as active on [place] [facility]- will continue to look into this.  [facility] to pick pt up 9/10 at 12pm- pt dtr informed and pt could not be reached.   SDOH Screenings   Food Insecurity: No Food Insecurity (01/13/2023)  Recent Concern: Food Insecurity - Food Insecurity Present (10/26/2022)  Housing: Low Risk  (01/13/2023)  Transportation Needs: Unmet Transportation Needs (04/13/2023)  Utilities: Not At Risk (01/13/2023)  Alcohol Screen: Low Risk  (05/06/2022)  Financial Resource Strain: Low Risk  (03/11/2022)  Tobacco Use: High Risk (04/20/2023)       06/24/2023  Marcus Tran DOB: 1950-11-24 MRN: [id number]   RIDER WAIVER AND RELEASE OF LIABILITY  For the purposes of helping with transportation needs, [facility] partners with outside transportation providers (taxi companies, [place], Catering manager.) to give [name] patients or other approved people the choice of on-demand rides [name]") to our buildings for non-emergency visits.  By using [employer], I, the person signing this document, on behalf of myself and/or any legal minors (in my care using the [employer]), agree:  Science writer given to me are supplied by independent, outside transportation providers who do not work for, or have any affiliation with, [name]. [place] is not a transportation company. [place] has no control over the quality or safety of the rides I get using [employer]. [place] has no control over whether any outside ride will happen on time or not. [facility] gives no guarantee on the reliability, quality, safety, or availability on any rides, or that no mistakes will happen. I know and accept that traveling by vehicle (car, truck, SVU, Zenaida Niece, bus, taxi, etc.) has risks of  serious injuries such as disability, being paralyzed, and death. I know and agree the risk of using [employer] is mine alone, and not [employer]. Transport Services are provided "as is" and as are available. The transportation providers are in charge for all inspections and care of the vehicles used to provide these rides. I agree not to take legal action against [place], its agents, employees, officers, directors, representatives, insurers, attorneys, assigns, successors, subsidiaries, and affiliates at any time for any reasons related directly or indirectly to using [employer]. I also agree not to take legal action against [place] or its affiliates for any injury, death, or damage to property caused by or related to using [employer]. I have read this Waiver and Release of Liability, and I understand the terms used in it and their legal meaning. This Waiver is freely and voluntarily given with the understanding that my right (or any legal minors) to legal action against  relating to [employer] is knowingly given up to use these services.   I attest that I read the Ride Waiver and Release of Liability to Marcus Tran, gave [name] the opportunity to ask questions and answered the questions asked (if any). I affirm that Marcus Tran then provided consent for assistance with transportation.     Burna Sis

## 2023-06-25 ENCOUNTER — Encounter (HOSPITAL_COMMUNITY): Payer: 59 | Admitting: Cardiology

## 2023-06-25 ENCOUNTER — Other Ambulatory Visit (HOSPITAL_COMMUNITY): Payer: 59

## 2023-06-29 ENCOUNTER — Telehealth (HOSPITAL_COMMUNITY): Payer: Self-pay | Admitting: Emergency Medicine

## 2023-06-29 ENCOUNTER — Ambulatory Visit (HOSPITAL_BASED_OUTPATIENT_CLINIC_OR_DEPARTMENT_OTHER): Payer: 59 | Attending: Cardiology | Admitting: Cardiology

## 2023-06-29 ENCOUNTER — Ambulatory Visit (HOSPITAL_COMMUNITY)
Admission: RE | Admit: 2023-06-29 | Discharge: 2023-06-29 | Disposition: A | Discharge status: Home / Self Care | Payer: 59 | Source: Ambulatory Visit | Attending: Internal Medicine

## 2023-06-29 DIAGNOSIS — I5020 Unspecified systolic (congestive) heart failure: Secondary | ICD-10-CM | POA: Diagnosis present

## 2023-06-29 LAB — BASIC METABOLIC PANEL
Anion gap: 11 (ref 5–15)
BUN: 18 mg/dL (ref 8–23)
CO2: 27 mmol/L (ref 22–32)
Calcium: 9.3 mg/dL (ref 8.9–10.3)
Chloride: 95 mmol/L — ABNORMAL LOW (ref 98–111)
Creatinine, Ser: 1.75 mg/dL — ABNORMAL HIGH (ref 0.61–1.24)
GFR, Estimated: 41 mL/min — ABNORMAL LOW (ref >60)
Glucose, Bld: 128 mg/dL — ABNORMAL HIGH (ref 70–99)
Potassium: 3.5 mmol/L (ref 3.5–5.1)
Sodium: 133 mmol/L — ABNORMAL LOW (ref 135–145)

## 2023-06-29 LAB — BRAIN NATRIURETIC PEPTIDE: B Natriuretic Peptide: 553.1 pg/mL — ABNORMAL HIGH (ref 0.0–100.0)

## 2023-06-29 NOTE — Telephone Encounter (Signed)
Spoke to Marcus Tran in person at the [facility] Clinic

## 2023-06-30 NOTE — Telephone Encounter (Signed)
Saw briefly in HF Clinic today (9/10) and reminded him of his sleep study appointment @ 8:00 9/10.  Also advised pt will get his bubble packs completed and delivered after labs incase any med changes needed.    Beatrix Shipper, EMT-Paramedic [phone] 06/30/2023

## 2023-07-07 ENCOUNTER — Other Ambulatory Visit: Payer: Self-pay | Admitting: Student

## 2023-07-07 ENCOUNTER — Other Ambulatory Visit (HOSPITAL_COMMUNITY): Payer: Self-pay | Admitting: Emergency Medicine

## 2023-07-07 DIAGNOSIS — H34212 Partial retinal artery occlusion, left eye: Secondary | ICD-10-CM

## 2023-07-08 NOTE — Progress Notes (Signed)
Paramedicine Encounter    Patient ID: Marcus Tran, male    DOB: 1951-02-16, 72 y.o.   MRN: [id number]   Complaints NONE  Assessment A&O x 4, skin W&D w/ good color.  Lung sounds clear throughout and no peripheral edema noted.  Compliance with meds YES  Pill box filled n/a  on bubble packs  Refills needed NONE  Meds changes since last visit NONE    Social changes NONE   BP (!) 108/0 (BP Location: Right Arm, Patient Position: Sitting, Cuff Size: Normal)   Pulse 62   Resp 16   Wt 145 lb 12.8 oz (66.1 kg)   SpO2 96%   BMI 24.26 kg/m  Weight yesterday- Not taken Last visit weight-149lb 06/17/23  ACTION: Home visit completed  Bethanie Dicker [phone] 07/08/23  Patient Care Team: Hillery Aldo, NP as PCP - Reatha Armour, MD as PCP - Cardiology (Cardiology)  Patient Active Problem List   Diagnosis Date Noted   Cocaine use 01/15/2023   Noncompliance 01/13/2023   Adenovirus pneumonia 11/12/2022   Chronic HFrEF (heart failure with reduced ejection fraction) (HCC) 08/13/2022   DM2 (diabetes mellitus, type 2) (HCC) 08/13/2022   Obstructive sleep apnea (adult) (pediatric) 08/08/2022   Acute respiratory failure with hypoxia and hypercapnia (HCC) 05/04/2022   Third degree heart block (HCC) 04/15/2022   Snoring 04/15/2022   Nonischemic cardiomyopathy (HCC) 04/15/2022   HFrEF (heart failure with reduced ejection fraction) (HCC) 04/15/2022   COPD with acute exacerbation (HCC) 03/22/2022   Chronic combined systolic and diastolic CHF (congestive heart failure) (HCC)    Tobacco use disorder 03/08/2022   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   HTN (hypertension) 03/08/2022   Stage 3a chronic kidney disease (CKD) (HCC) 03/08/2022   Coronary artery disease 03/08/2022    Current Outpatient Medications:    albuterol (VENTOLIN HFA) 108 (90 Base) MCG/ACT inhaler, Inhale 2 puffs into the lungs every 4 (four) hours as needed for wheezing or shortness  of breath., Disp: 1 each, Rfl: 2   aspirin EC 81 MG tablet, Take 1 tablet (81 mg total) by mouth daily. Swallow whole., Disp: 90 tablet, Rfl: 3   atorvastatin (LIPITOR) 80 MG tablet, TAKE 1 Tablet BY MOUTH ONCE DAILY EVERY EVENING, Disp: 90 tablet, Rfl: 3   bisoprolol (ZEBETA) 5 MG tablet, Take 1 tablet (5 mg total) by mouth daily., Disp: 90 tablet, Rfl: 3   FARXIGA 10 MG TABS tablet, TAKE 1 Tablet BY MOUTH ONCE DAILY (MORNING), Disp: 30 tablet, Rfl: 5   furosemide (LASIX) 40 MG tablet, TAKE 1 Tablet BY MOUTH TWICE DAILY (morning AND evening), Disp: 180 tablet, Rfl: 3   ipratropium-albuterol (DUONEB) 0.5-2.5 (3) MG/3ML SOLN, Inhale 3 mLs into the lungs in the morning, at noon, in the evening, and at bedtime., Disp: 360 mL, Rfl: 11   isosorbide-hydrALAZINE (BIDIL) 20-37.5 MG tablet, Take 0.5 tablets by mouth 3 (three) times daily., Disp: 45 tablet, Rfl: 3   ketoconazole (NIZORAL) 2 % cream, Apply 1 Application topically daily as needed for irritation., Disp: , Rfl:    metFORMIN (GLUCOPHAGE) 500 MG tablet, Take 1 tablet (500 mg total) by mouth 2 (two) times daily with a meal., Disp: 60 tablet, Rfl: 0   montelukast (SINGULAIR) 10 MG tablet, TAKE 1 Tablet BY MOUTH ONCE DAILY at bedtime (evening), Disp: 30 tablet, Rfl: 11   sacubitril-valsartan (ENTRESTO) 24-26 MG, Take 1 tablet by mouth 2 (two) times daily., Disp: 60 tablet, Rfl: 11   Tafamidis (VYNDAMAX) 61 MG CAPS,  Take 1 capsule (61 mg total) by mouth daily., Disp: 30 capsule, Rfl: 11   TRELEGY ELLIPTA 100-62.5-25 MCG/ACT AEPB, Take 1 puff by mouth daily., Disp: , Rfl:  No Known Allergies   Social History   Socioeconomic History   Marital status: Single    Spouse name: Not on file   Number of children: 1   Years of education: Not on file   Highest education level: 10th grade  Occupational History   Occupation: Disability  Tobacco Use   Smoking status: Some Days    Current packs/day: 0.00    Average packs/day: 0.5 packs/day for 50.0 years  (25.0 ttl pk-yrs)    Types: Cigarettes    Start date: 03/19/1972    Last attempt to quit: 03/19/2022    Years since quitting: 1.3   Smokeless tobacco: Never   Tobacco comments:    Smoking cessation  Vaping Use   Vaping status: Never Used  Substance and Sexual Activity   Alcohol use: Not on file    Comment: socially   Drug use: Yes    Types: Marijuana    Comment: past   Sexual activity: Not on file  Other Topics Concern   Not on file  Social History Narrative   He lives with his daughter apparently and 5 grandchildren.  He reports that he smokes probably less than a pack of cigarettes a day.  He occasionally drinks a beer or liquor but not daily.   Social Determinants of Health   Financial Resource Strain: Low Risk  (03/11/2022)   Overall Financial Resource Strain (CARDIA)    Difficulty of Paying Living Expenses: Not very hard  Food Insecurity: No Food Insecurity (01/13/2023)   Hunger Vital Sign    Worried About Running Out of Food in the Last Year: Never true    Ran Out of Food in the Last Year: Never true  Recent Concern: Food Insecurity - Food Insecurity Present (10/26/2022)   Hunger Vital Sign    Worried About Running Out of Food in the Last Year: Often true    Ran Out of Food in the Last Year: Often true  Transportation Needs: Unmet Transportation Needs (04/13/2023)   PRAPARE - Administrator, Civil Service (Medical): Yes    Lack of Transportation (Non-Medical): Yes  Physical Activity: Not on file  Stress: Not on file  Social Connections: Not on file  Intimate Partner Violence: Not At Risk (01/13/2023)   Humiliation, Afraid, Rape, and Kick questionnaire    Fear of Current or Ex-Partner: No    Emotionally Abused: No    Physically Abused: No    Sexually Abused: No    Physical Exam      Future Appointments  Date Time Provider Department Center  07/13/2023  2:45 PM GI[facility] [place] 4 [id number] [facility]  08/13/2023 11:00 AM MC ECHO OP 1 [facility]ECHOL[facility]   08/13/2023 12:00 PM Laurey Morale, MD MC-[facility] None

## 2023-07-09 ENCOUNTER — Telehealth (HOSPITAL_COMMUNITY): Payer: Self-pay

## 2023-07-09 NOTE — Telephone Encounter (Signed)
Contacted [facility] at [facility] to reschedule [name] Sleep Study- It is now scheduled for Tuesday October 15th at 8:00PM  I will make Dede Katrinka Blazing- Community Paramedic Aware to assist [name] with getting to this appointment.   Call complete.   Maralyn Sago, EMT-Paramedic [phone] 07/09/2023

## 2023-07-10 ENCOUNTER — Encounter (HOSPITAL_COMMUNITY): Payer: Self-pay

## 2023-07-12 ENCOUNTER — Telehealth (HOSPITAL_COMMUNITY): Payer: Self-pay | Admitting: *Deleted

## 2023-07-13 ENCOUNTER — Ambulatory Visit
Admission: RE | Admit: 2023-07-13 | Discharge: 2023-07-13 | Disposition: A | Discharge status: Home / Self Care | Payer: 59 | Source: Ambulatory Visit | Attending: Student

## 2023-07-13 ENCOUNTER — Telehealth (HOSPITAL_COMMUNITY): Payer: Self-pay | Admitting: Pharmacist

## 2023-07-13 DIAGNOSIS — H34212 Partial retinal artery occlusion, left eye: Secondary | ICD-10-CM

## 2023-07-13 NOTE — Telephone Encounter (Signed)
Received message from [facility] that patient presented to office today to be treated for allergic dermatitis caused by Vyndamax.   Of note, I spoke with Dede, (HF paramedic) a few weeks prior and he had an allergic reaction at that time after starting Vyndamax. Plan was to stop Vyndamax for 1-2 weeks then try again to see if rash reoccurs. Called Dede today and she confirmed this was done and he had now restarted and had another reaction.   We will stop Vyndamax and will add to allergy list. I will remove Vyndamax from medication list.   Karle Plumber, PharmD, BCPS, BCCP, CPP Heart Failure Clinic Pharmacist [phone]

## 2023-07-14 ENCOUNTER — Other Ambulatory Visit (HOSPITAL_COMMUNITY): Payer: Self-pay | Admitting: Pharmacy Technician

## 2023-07-14 NOTE — Progress Notes (Signed)
Advanced Heart Failure Patient Advocate Encounter  Patient has experienced allergic reaction, d/c Vyndamax.  Archer Asa, CPhT

## 2023-07-16 NOTE — Telephone Encounter (Signed)
Auth #[id number] exp 11/8 PYP

## 2023-07-20 ENCOUNTER — Other Ambulatory Visit (HOSPITAL_COMMUNITY): Payer: Self-pay | Admitting: Emergency Medicine

## 2023-07-20 NOTE — Progress Notes (Signed)
Paramedicine Encounter    Patient ID: Marcus Tran, male    DOB: December 06, 1950, 72 y.o.   MRN: [id number]   Complaints NONE  Assessment A&O x 4, skin W&D w/ good color.  Compliance with meds YES  Pill box filled n/a   Refills needed NONE  Meds changes since last visit:  discontinued Vyndamax due to pt. Allergic.   Social changes NONE   BP (!) 140/80 (BP Location: Right Arm, Patient Position: Sitting, Cuff Size: Normal)   Pulse 84   Resp 16   Wt 149 lb 3.2 oz (67.7 kg)   SpO2 99%   BMI 24.83 kg/m  Weight yesterday-not taken Last visit weight-145lb   Today's visit finds Marcus Tran w/o complaint of chest pain or SOB.  Lung sounds clear throughout and no peripheral edema noted.  He is compliant w/ all his meds taken from bubble packs.  He has one more weeks worth before he needs refills for same.  ACTION: Home visit completed Next visit planned for 07/27/23 @ 11:00  Bethanie Dicker [phone] 07/20/23  Patient Care Team: Hillery Aldo, NP as PCP - Reatha Armour, MD as PCP - Cardiology (Cardiology)  Patient Active Problem List   Diagnosis Date Noted   Cocaine use 01/15/2023   Noncompliance 01/13/2023   Adenovirus pneumonia 11/12/2022   Chronic HFrEF (heart failure with reduced ejection fraction) (HCC) 08/13/2022   DM2 (diabetes mellitus, type 2) (HCC) 08/13/2022   Obstructive sleep apnea (adult) (pediatric) 08/08/2022   Acute respiratory failure with hypoxia and hypercapnia (HCC) 05/04/2022   Third degree heart block (HCC) 04/15/2022   Snoring 04/15/2022   Nonischemic cardiomyopathy (HCC) 04/15/2022   HFrEF (heart failure with reduced ejection fraction) (HCC) 04/15/2022   COPD with acute exacerbation (HCC) 03/22/2022   Chronic combined systolic and diastolic CHF (congestive heart failure) (HCC)    Tobacco use disorder 03/08/2022   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   HTN (hypertension) 03/08/2022   Stage 3a chronic kidney disease  (CKD) (HCC) 03/08/2022   Coronary artery disease 03/08/2022    Current Outpatient Medications:    albuterol (VENTOLIN HFA) 108 (90 Base) MCG/ACT inhaler, Inhale 2 puffs into the lungs every 4 (four) hours as needed for wheezing or shortness of breath., Disp: 1 each, Rfl: 2   aspirin EC 81 MG tablet, Take 1 tablet (81 mg total) by mouth daily. Swallow whole., Disp: 90 tablet, Rfl: 3   atorvastatin (LIPITOR) 80 MG tablet, TAKE 1 Tablet BY MOUTH ONCE DAILY EVERY EVENING, Disp: 90 tablet, Rfl: 3   bisoprolol (ZEBETA) 5 MG tablet, Take 1 tablet (5 mg total) by mouth daily., Disp: 90 tablet, Rfl: 3   FARXIGA 10 MG TABS tablet, TAKE 1 Tablet BY MOUTH ONCE DAILY (MORNING), Disp: 30 tablet, Rfl: 5   furosemide (LASIX) 40 MG tablet, TAKE 1 Tablet BY MOUTH TWICE DAILY (morning AND evening), Disp: 180 tablet, Rfl: 3   ipratropium-albuterol (DUONEB) 0.5-2.5 (3) MG/3ML SOLN, Inhale 3 mLs into the lungs in the morning, at noon, in the evening, and at bedtime., Disp: 360 mL, Rfl: 11   isosorbide-hydrALAZINE (BIDIL) 20-37.5 MG tablet, Take 0.5 tablets by mouth 3 (three) times daily., Disp: 45 tablet, Rfl: 3   ketoconazole (NIZORAL) 2 % cream, Apply 1 Application topically daily as needed for irritation., Disp: , Rfl:    metFORMIN (GLUCOPHAGE) 500 MG tablet, Take 1 tablet (500 mg total) by mouth 2 (two) times daily with a meal., Disp: 60 tablet, Rfl: 0   montelukast (  SINGULAIR) 10 MG tablet, TAKE 1 Tablet BY MOUTH ONCE DAILY at bedtime (evening), Disp: 30 tablet, Rfl: 11   sacubitril-valsartan (ENTRESTO) 24-26 MG, Take 1 tablet by mouth 2 (two) times daily., Disp: 60 tablet, Rfl: 11   TRELEGY ELLIPTA 100-62.5-25 MCG/ACT AEPB, Take 1 puff by mouth daily., Disp: , Rfl:  Allergies  Allergen Reactions   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    Allergic dermatitis/rash     Social History   Socioeconomic History   Marital status: Single    Spouse name: Not on file   Number of children: 1   Years of education: Not  on file   Highest education level: 10th grade  Occupational History   Occupation: Disability  Tobacco Use   Smoking status: Some Days    Current packs/day: 0.00    Average packs/day: 0.5 packs/day for 50.0 years (25.0 ttl pk-yrs)    Types: Cigarettes    Start date: 03/19/1972    Last attempt to quit: 03/19/2022    Years since quitting: 1.3   Smokeless tobacco: Never   Tobacco comments:    Smoking cessation  Vaping Use   Vaping status: Never Used  Substance and Sexual Activity   Alcohol use: Not on file    Comment: socially   Drug use: Yes    Types: Marijuana    Comment: past   Sexual activity: Not on file  Other Topics Concern   Not on file  Social History Narrative   He lives with his daughter apparently and 5 grandchildren.  He reports that he smokes probably less than a pack of cigarettes a day.  He occasionally drinks a beer or liquor but not daily.   Social Determinants of Health   Financial Resource Strain: Low Risk  (03/11/2022)   Overall Financial Resource Strain (CARDIA)    Difficulty of Paying Living Expenses: Not very hard  Food Insecurity: No Food Insecurity (01/13/2023)   Hunger Vital Sign    Worried About Running Out of Food in the Last Year: Never true    Ran Out of Food in the Last Year: Never true  Recent Concern: Food Insecurity - Food Insecurity Present (10/26/2022)   Hunger Vital Sign    Worried About Running Out of Food in the Last Year: Often true    Ran Out of Food in the Last Year: Often true  Transportation Needs: Unmet Transportation Needs (04/13/2023)   PRAPARE - Administrator, Civil Service (Medical): Yes    Lack of Transportation (Non-Medical): Yes  Physical Activity: Not on file  Stress: Not on file  Social Connections: Not on file  Intimate Partner Violence: Not At Risk (01/13/2023)   Humiliation, Afraid, Rape, and Kick questionnaire    Fear of Current or Ex-Partner: No    Emotionally Abused: No    Physically Abused: No     Sexually Abused: No    Physical Exam      Future Appointments  Date Time Provider Department Center  08/03/2023  8:00 PM Quintella Reichert, MD [facility]-SLEEL [facility]  08/13/2023 11:00 AM MC ECHO OP 1 [facility]ECHOL[facility]  08/13/2023 12:00 PM Laurey Morale, MD MC-[facility] None

## 2023-07-27 ENCOUNTER — Other Ambulatory Visit (HOSPITAL_COMMUNITY): Payer: Self-pay | Admitting: Emergency Medicine

## 2023-07-27 NOTE — Progress Notes (Unsigned)
Paramedicine Encounter    Patient ID: Marcus Tran, male    DOB: Oct 18, 1951, 72 y.o.   MRN: [id number]   Complaints***  Assessment***  Compliance with meds***  Pill box filled***  Refills needed***  Meds changes since last visit***    Social changes***   There were no vitals taken for this visit. Weight yesterday- not taken Last visit weight-149lb  ACTION: {Paramed Action:[phone]}  Beatrix Shipper, EMT-Paramedic [phone] 07/27/23  Patient Care Team: Hillery Aldo, NP as PCP - General Rollene Rotunda, MD as PCP - Cardiology (Cardiology)  Patient Active Problem List   Diagnosis Date Noted  . Cocaine use 01/15/2023  . Noncompliance 01/13/2023  . Adenovirus pneumonia 11/12/2022  . Chronic HFrEF (heart failure with reduced ejection fraction) (HCC) 08/13/2022  . DM2 (diabetes mellitus, type 2) (HCC) 08/13/2022  . Obstructive sleep apnea (adult) (pediatric) 08/08/2022  . Acute respiratory failure with hypoxia and hypercapnia (HCC) 05/04/2022  . Third degree heart block (HCC) 04/15/2022  . Snoring 04/15/2022  . Nonischemic cardiomyopathy (HCC) 04/15/2022  . HFrEF (heart failure with reduced ejection fraction) (HCC) 04/15/2022  . COPD with acute exacerbation (HCC) 03/22/2022  . Chronic combined systolic and diastolic CHF (congestive heart failure) (HCC)   . Tobacco use disorder 03/08/2022  . 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 HTN (hypertension) 03/08/2022  . Stage 3a chronic kidney disease (CKD) (HCC) 03/08/2022  . Coronary artery disease 03/08/2022    Current Outpatient Medications:  .  albuterol (VENTOLIN HFA) 108 (90 Base) MCG/ACT inhaler, Inhale 2 puffs into the lungs every 4 (four) hours as needed for wheezing or shortness of breath., Disp: 1 each, Rfl: 2 .  aspirin EC 81 MG tablet, Take 1 tablet (81 mg total) by mouth daily. Swallow whole., Disp: 90 tablet, Rfl: 3 .  atorvastatin (LIPITOR) 80 MG tablet, TAKE 1 Tablet BY MOUTH ONCE DAILY EVERY  EVENING, Disp: 90 tablet, Rfl: 3 .  bisoprolol (ZEBETA) 5 MG tablet, Take 1 tablet (5 mg total) by mouth daily., Disp: 90 tablet, Rfl: 3 .  FARXIGA 10 MG TABS tablet, TAKE 1 Tablet BY MOUTH ONCE DAILY (MORNING), Disp: 30 tablet, Rfl: 5 .  furosemide (LASIX) 40 MG tablet, TAKE 1 Tablet BY MOUTH TWICE DAILY (morning AND evening), Disp: 180 tablet, Rfl: 3 .  ipratropium-albuterol (DUONEB) 0.5-2.5 (3) MG/3ML SOLN, Inhale 3 mLs into the lungs in the morning, at noon, in the evening, and at bedtime., Disp: 360 mL, Rfl: 11 .  isosorbide-hydrALAZINE (BIDIL) 20-37.5 MG tablet, Take 0.5 tablets by mouth 3 (three) times daily., Disp: 45 tablet, Rfl: 3 .  ketoconazole (NIZORAL) 2 % cream, Apply 1 Application topically daily as needed for irritation., Disp: , Rfl:  .  metFORMIN (GLUCOPHAGE) 500 MG tablet, Take 1 tablet (500 mg total) by mouth 2 (two) times daily with a meal., Disp: 60 tablet, Rfl: 0 .  montelukast (SINGULAIR) 10 MG tablet, TAKE 1 Tablet BY MOUTH ONCE DAILY at bedtime (evening), Disp: 30 tablet, Rfl: 11 .  sacubitril-valsartan (ENTRESTO) 24-26 MG, Take 1 tablet by mouth 2 (two) times daily., Disp: 60 tablet, Rfl: 11 .  TRELEGY ELLIPTA 100-62.5-25 MCG/ACT AEPB, Take 1 puff by mouth daily., Disp: , Rfl:  Allergies  Allergen Reactions  . 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 Marital status: Single    Spouse name: Not on file  . Number of children: 1  . Years of education: Not on file  .  Highest education level: 10th grade  Occupational History  . Occupation: Disability  Tobacco Use  . Smoking status: Some Days    Current packs/day: 0.00    Average packs/day: 0.5 packs/day for 50.0 years (25.0 ttl pk-yrs)    Types: Cigarettes    Start date: 03/19/1972    Last attempt to quit: 03/19/2022    Years since quitting: 1.3  . Smokeless tobacco: Never  . Tobacco comments:    Smoking cessation  Vaping Use  . Vaping status:  Never Used  Substance and Sexual Activity  . Alcohol use: Not on file    Comment: socially  . Drug use: Yes    Types: Marijuana    Comment: past  . Sexual activity: Not on file  Other Topics Concern  . Not on file  Social History Narrative   He lives with his daughter apparently and 5 grandchildren.  He reports that he smokes probably less than a pack of cigarettes a day.  He occasionally drinks a beer or liquor but not daily.   Social Determinants of Health   Financial Resource Strain: Low Risk  (03/11/2022)   Overall Financial Resource Strain (CARDIA)   . Difficulty of Paying Living Expenses: Not very hard  Food Insecurity: No Food Insecurity (01/13/2023)   Hunger Vital Sign   . Worried About Programme researcher, broadcasting/film/video in the Last Year: Never true   . Ran Out of Food in the Last Year: Never true  Recent Concern: Food Insecurity - Food Insecurity Present (10/26/2022)   Hunger Vital Sign   . Worried About Programme researcher, broadcasting/film/video in the Last Year: Often true   . Ran Out of Food in the Last Year: Often true  Transportation Needs: Unmet Transportation Needs (04/13/2023)   PRAPARE - Transportation   . Lack of Transportation (Medical): Yes   . Lack of Transportation (Non-Medical): Yes  Physical Activity: Not on file  Stress: Not on file  Social Connections: Not on file  Intimate Partner Violence: Not At Risk (01/13/2023)   Humiliation, Afraid, Rape, and Kick questionnaire   . Fear of Current or Ex-Partner: No   . Emotionally Abused: No   . Physically Abused: No   . Sexually Abused: No    Physical Exam      Future Appointments  Date Time Provider Department Center  08/03/2023  8:00 PM Quintella Reichert, MD [facility]  08/13/2023 11:00 AM [facility] ECHO OP 1 [facility]ECHOL[facility]  08/13/2023 12:00 PM Laurey Morale, MD [facility] None

## 2023-08-03 ENCOUNTER — Ambulatory Visit (HOSPITAL_BASED_OUTPATIENT_CLINIC_OR_DEPARTMENT_OTHER): Payer: 59 | Admitting: Cardiology

## 2023-08-10 ENCOUNTER — Other Ambulatory Visit (HOSPITAL_COMMUNITY): Payer: Self-pay | Admitting: Emergency Medicine

## 2023-08-10 NOTE — Progress Notes (Unsigned)
Paramedicine Encounter    Patient ID: Marcus Tran, male    DOB: [birth year], 72 y.o.   MRN: [id number]   Complaints NONE  Assessment A&O x 4, skin W&D w/ good color.  Lung sounds clear throughout.  No peripheral edema noted.  Compliance with meds YES  Pill box filled n/a Bubble packs  Refills needed NONE  Meds changes since last visit NONE    Social changes NONE   BP 100/60 (BP Location: Right Arm, Patient Position: Sitting, Cuff Size: Normal)   Pulse 72   Resp 16   Wt 142 lb 12.8 oz (64.8 kg)   SpO2 95%   BMI 23.76 kg/m  Weight yesterday-not taken Last visit weight-143lb  [name] A&O x 4, reporting to be doing well.  He continues to do well with his bubble packs and I reviewed same for compliance.  He denies chest pain or SOB.  Lung sounds clear.  No edema noted. Discussed with pt regarding his cancelling his sleep study.  He states, "honestly, I really don't want to go. I'll call and reschedule when I'm ready."  I encouraged him to do so as this can have a positive impact on his heart failure and help to strengthen his heart. Next home visit scheduled 08/24/23 @ 1:15.   ACTION: Home visit completed  Bethanie Dicker [phone] 08/10/23  Patient Care Team: Hillery Aldo, NP as PCP - Reatha Armour, MD as PCP - Cardiology (Cardiology)  Patient Active Problem List   Diagnosis Date Noted   Cocaine use 01/15/2023   Noncompliance 01/13/2023   Adenovirus pneumonia 11/12/2022   Chronic HFrEF (heart failure with reduced ejection fraction) (HCC) 08/13/2022   DM2 (diabetes mellitus, type 2) (HCC) 08/13/2022   Obstructive sleep apnea (adult) (pediatric) 08/08/2022   Acute respiratory failure with hypoxia and hypercapnia (HCC) 05/04/2022   Third degree heart block (HCC) 04/15/2022   Snoring 04/15/2022   Nonischemic cardiomyopathy (HCC) 04/15/2022   HFrEF (heart failure with reduced ejection fraction) (HCC) 04/15/2022   COPD with acute  exacerbation (HCC) 03/22/2022   Chronic combined systolic and diastolic CHF (congestive heart failure) (HCC)    Tobacco use disorder 03/08/2022   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 Swallow whole., Disp: 90 tablet, Rfl: 3   atorvastatin (LIPITOR) 80 MG tablet, TAKE 1 Tablet BY MOUTH ONCE DAILY EVERY EVENING, Disp: 90 tablet, Rfl: 3   bisoprolol (ZEBETA) 5 MG tablet, Take 1 tablet (5 mg total) by mouth daily., Disp: 90 tablet, Rfl: 3   FARXIGA 10 MG TABS tablet, TAKE 1 Tablet BY MOUTH ONCE DAILY (MORNING), Disp: 30 tablet, Rfl: 5   furosemide (LASIX) 40 MG tablet, TAKE 1 Tablet BY MOUTH TWICE DAILY (morning AND evening), Disp: 180 tablet, Rfl: 3   ipratropium-albuterol (DUONEB) 0.5-2.5 (3) MG/3ML SOLN, Inhale 3 mLs into the lungs in the morning, at noon, in the evening, and at bedtime., Disp: 360 mL, Rfl: 11   isosorbide-hydrALAZINE (BIDIL) 20-37.5 MG tablet, Take 0.5 tablets by mouth 3 (three) times daily., Disp: 45 tablet, Rfl: 3  ketoconazole (NIZORAL) 2 % cream, Apply 1 Application topically daily as needed for irritation., Disp: , Rfl:    metFORMIN (GLUCOPHAGE) 500 MG tablet, Take 1 tablet (500 mg total) by mouth 2 (two) times daily with a meal., Disp: 60 tablet, Rfl: 0   montelukast (SINGULAIR) 10 MG tablet, TAKE 1 Tablet BY MOUTH ONCE DAILY at bedtime (evening), Disp: 30 tablet, Rfl: 11   sacubitril-valsartan (ENTRESTO) 24-26 MG, Take 1 tablet by mouth 2 (two) times daily., Disp: 60 tablet, Rfl: 11   TRELEGY ELLIPTA 100-62.5-25 MCG/ACT AEPB, Take 1 puff by mouth daily.,  Disp: , Rfl:  Allergies  Allergen Reactions   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    Allergic dermatitis/rash     Social History   Socioeconomic History   Marital status: Single    Spouse name: Not on file   Number of children: 1   Years of education: Not on file   Highest education level: 10th grade  Occupational History   Occupation: Disability  Tobacco Use   Smoking status: Some Days    Current packs/day: 0.00    Average packs/day: 0.5 packs/day for 50.0 years (25.0 ttl pk-yrs)    Types: Cigarettes    Start date: 03/19/1972    Last attempt to quit: 03/19/2022    Years since quitting: 1.3   Smokeless tobacco: Never   Tobacco comments:    Smoking cessation  Vaping Use   Vaping status: Never Used  Substance and Sexual Activity   Alcohol use: Not on file    Comment: socially   Drug use: Yes    Types: Marijuana    Comment: past   Sexual activity: Not on file  Other Topics Concern   Not on file  Social History Narrative   He lives with his daughter apparently and 5 grandchildren.  He reports that he smokes probably less than a pack of cigarettes a day.  He occasionally drinks a beer or liquor but not daily.   Social Determinants of Health   Financial Resource Strain: Low Risk  (03/11/2022)   Overall Financial Resource Strain (CARDIA)    Difficulty of Paying Living Expenses: Not very hard  Food Insecurity: No Food Insecurity (01/13/2023)   Hunger Vital Sign    Worried About Running Out of Food in the Last Year: Never true    Ran Out of Food in the Last Year: Never true  Recent Concern: Food Insecurity - Food Insecurity Present (10/26/2022)   Hunger Vital Sign    Worried About Running Out of Food in the Last Year: Often true    Ran Out of Food in the Last Year: Often true  Transportation Needs: Unmet Transportation Needs (04/13/2023)   PRAPARE - Administrator, Civil Service (Medical): Yes    Lack of Transportation (Non-Medical): Yes  Physical Activity: Not on  file  Stress: Not on file  Social Connections: Not on file  Intimate Partner Violence: Not At Risk (01/13/2023)   Humiliation, Afraid, Rape, and Kick questionnaire    Fear of Current or Ex-Partner: No    Emotionally Abused: No    Physically Abused: No    Sexually Abused: No    Physical Exam      Future Appointments  Date Time Provider Department Center  08/13/2023 11:00 AM [facility] ECHO OP 1 [facility]  08/13/2023 12:00 PM Laurey Morale, MD [facility] None

## 2023-08-11 ENCOUNTER — Telehealth (HOSPITAL_COMMUNITY): Payer: Self-pay | Admitting: Licensed Clinical Social Worker

## 2023-08-11 NOTE — Telephone Encounter (Signed)
H&V Care Navigation CSW Progress Note  Clinical Social Worker assisted in arranging [employer] to bring pt to appt on 10/25- pick up for 10:30am- CSW called pt and informed   SDOH Screenings   Food Insecurity: No Food Insecurity (01/13/2023)  Recent Concern: Food Insecurity - Food Insecurity Present (10/26/2022)  Housing: Low Risk  (01/13/2023)  Transportation Needs: Unmet Transportation Needs (04/13/2023)  Utilities: Not At Risk (01/13/2023)  Alcohol Screen: Low Risk  (05/06/2022)  Financial Resource Strain: Low Risk  (03/11/2022)  Tobacco Use: High Risk (04/20/2023)   08/11/2023  Marcus Tran DOB: [birth year] MRN: [id number]   RIDER WAIVER AND RELEASE OF LIABILITY  For the purposes of helping with transportation needs, [facility] partners with outside transportation providers (taxi companies, [place], Catering manager.) to give [name] patients or other approved people the choice of on-demand rides [name]") to our buildings for non-emergency visits.  By using [employer], I, the person signing this document, on behalf of myself and/or any legal minors (in my care using the [employer]), agree:  Science writer given to me are supplied by independent, outside transportation providers who do not work for, or have any affiliation with, [name]. [place] is not a transportation company. [facility] has no control over the quality or safety of the rides I get using [employer]. [facility] has no control over whether any outside ride will happen on time or not. [place] gives no guarantee on the reliability, quality, safety, or availability on any rides, or that no mistakes will happen. I know and accept that traveling by vehicle (car, truck, SVU, Zenaida Niece, bus, taxi, etc.) has risks of serious injuries such as disability, being paralyzed, and death. I know and agree the risk of using [employer] is mine alone, and not [employer]. Transport Services are  provided "as is" and as are available. The transportation providers are in charge for all inspections and care of the vehicles used to provide these rides. I agree not to take legal action against [place], its agents, employees, officers, directors, representatives, insurers, attorneys, assigns, successors, subsidiaries, and affiliates at any time for any reasons related directly or indirectly to using [employer]. I also agree not to take legal action against [place] or its affiliates for any injury, death, or damage to property caused by or related to using [employer]. I have read this Waiver and Release of Liability, and I understand the terms used in it and their legal meaning. This Waiver is freely and voluntarily given with the understanding that my right (or any legal minors) to legal action against  relating to [employer] is knowingly given up to use these services.   I attest that I read the Ride Waiver and Release of Liability to Marcus Tran, gave Mr. Maron the opportunity to ask questions and answered the questions asked (if any). I affirm that Marcus Tran then provided consent for assistance with transportation.     Burna Sis

## 2023-08-13 ENCOUNTER — Ambulatory Visit (HOSPITAL_BASED_OUTPATIENT_CLINIC_OR_DEPARTMENT_OTHER)
Admission: RE | Admit: 2023-08-13 | Discharge: 2023-08-13 | Disposition: A | Discharge status: Home / Self Care | Payer: 59 | Source: Ambulatory Visit | Attending: Cardiology | Admitting: Cardiology

## 2023-08-13 ENCOUNTER — Encounter (HOSPITAL_COMMUNITY): Payer: Self-pay | Admitting: Cardiology

## 2023-08-13 ENCOUNTER — Ambulatory Visit (HOSPITAL_COMMUNITY)
Admission: RE | Admit: 2023-08-13 | Discharge: 2023-08-13 | Disposition: A | Discharge status: Home / Self Care | Payer: 59 | Source: Ambulatory Visit | Attending: Cardiology | Admitting: Cardiology

## 2023-08-13 VITALS — BP 134/80 | HR 64 | Wt 149.0 lb

## 2023-08-13 DIAGNOSIS — I5022 Chronic systolic (congestive) heart failure: Secondary | ICD-10-CM | POA: Diagnosis not present

## 2023-08-13 DIAGNOSIS — Z79899 Other long term (current) drug therapy: Secondary | ICD-10-CM | POA: Insufficient documentation

## 2023-08-13 DIAGNOSIS — I110 Hypertensive heart disease with heart failure: Secondary | ICD-10-CM | POA: Diagnosis not present

## 2023-08-13 DIAGNOSIS — Z7984 Long term (current) use of oral hypoglycemic drugs: Secondary | ICD-10-CM | POA: Diagnosis not present

## 2023-08-13 DIAGNOSIS — Z716 Tobacco abuse counseling: Secondary | ICD-10-CM | POA: Insufficient documentation

## 2023-08-13 DIAGNOSIS — I5042 Chronic combined systolic (congestive) and diastolic (congestive) heart failure: Secondary | ICD-10-CM

## 2023-08-13 DIAGNOSIS — I43 Cardiomyopathy in diseases classified elsewhere: Secondary | ICD-10-CM | POA: Insufficient documentation

## 2023-08-13 DIAGNOSIS — F17210 Nicotine dependence, cigarettes, uncomplicated: Secondary | ICD-10-CM | POA: Insufficient documentation

## 2023-08-13 DIAGNOSIS — Z7982 Long term (current) use of aspirin: Secondary | ICD-10-CM | POA: Insufficient documentation

## 2023-08-13 DIAGNOSIS — J449 Chronic obstructive pulmonary disease, unspecified: Secondary | ICD-10-CM | POA: Insufficient documentation

## 2023-08-13 DIAGNOSIS — G4733 Obstructive sleep apnea (adult) (pediatric): Secondary | ICD-10-CM | POA: Diagnosis not present

## 2023-08-13 DIAGNOSIS — I428 Other cardiomyopathies: Secondary | ICD-10-CM | POA: Insufficient documentation

## 2023-08-13 DIAGNOSIS — I2510 Atherosclerotic heart disease of native coronary artery without angina pectoris: Secondary | ICD-10-CM | POA: Diagnosis not present

## 2023-08-13 LAB — BASIC METABOLIC PANEL
Anion gap: 10 (ref 5–15)
BUN: 21 mg/dL (ref 8–23)
CO2: 27 mmol/L (ref 22–32)
Calcium: 9.4 mg/dL (ref 8.9–10.3)
Chloride: 100 mmol/L (ref 98–111)
Creatinine, Ser: 1.62 mg/dL — ABNORMAL HIGH (ref 0.61–1.24)
GFR, Estimated: 45 mL/min — ABNORMAL LOW (ref >60)
Glucose, Bld: 94 mg/dL (ref 70–99)
Potassium: 4.1 mmol/L (ref 3.5–5.1)
Sodium: 137 mmol/L (ref 135–145)

## 2023-08-13 LAB — ECHOCARDIOGRAM COMPLETE
AR max vel: 2.08 cm2
AV Area VTI: 2.16 cm2
AV Area mean vel: 2.06 cm2
AV Mean grad: 3 mm[Hg]
AV Peak grad: 4.9 mm[Hg]
Ao pk vel: 1.11 m/s
Area-P 1/2: 4.68 cm2
Calc EF: 44 %
Est EF: 40
S' Lateral: 3.3 cm
Single Plane A2C EF: 46.9 %
Single Plane A4C EF: 40.8 %

## 2023-08-13 LAB — BRAIN NATRIURETIC PEPTIDE: B Natriuretic Peptide: 279.4 pg/mL — ABNORMAL HIGH (ref 0.0–100.0)

## 2023-08-13 MED ORDER — SPIRONOLACTONE 25 MG PO TABS: 12.5000 mg | ORAL_TABLET | Freq: Every day | ORAL | 3 refills | Status: DC

## 2023-08-13 NOTE — Patient Instructions (Signed)
START Spironolactone 12.5 mg ( 1/2 tab) daily.  Labs done today, your results will be available in MyChart, we will contact you for abnormal readings.  Repeat blood work in 10 days.  Your physician has requested that you have a cardiac MRI. Cardiac MRI uses a computer to create images of your heart as its beating, producing both still and moving pictures of your heart and major blood vessels. For further information please visit [email or web address]. Please follow the instruction sheet given to you today for more information. ONCE APPROVED BY YOUR INSURANCE COMPANY YOU WILL BE CALLED TO HAVE THE TEST ARRANGED.  Your physician recommends that you schedule a follow-up appointment in: 6 weeks.  If you have any questions or concerns before your next appointment please send [place] a message through [place] or call our office at [phone].    TO LEAVE A MESSAGE FOR THE NURSE SELECT OPTION 2, PLEASE LEAVE A MESSAGE INCLUDING: YOUR NAME DATE OF BIRTH CALL BACK NUMBER REASON FOR CALL**this is important as we prioritize the call backs  YOU WILL RECEIVE A CALL BACK THE SAME DAY AS LONG AS YOU CALL BEFORE 4:00 PM  At the [facility], you and your health needs are our priority. As part of our continuing mission to provide you with exceptional heart care, we have created designated Provider Care Teams. These Care Teams include your primary Cardiologist (physician) and Advanced Practice Providers (APPs- Physician Assistants and Nurse Practitioners) who all work together to provide you with the care you need, when you need it.   You may see any of the following providers on your designated Care Team at your next follow up: Dr Arvilla Meres Dr Marca Ancona Dr. Dorthula Nettles Dr. Clearnce Hasten Amy Filbert Schilder, NP Robbie Lis, [place] [facility] [place], [place] Brynda Peon, NP [place] Lee, NP Karle Plumber, PharmD   Please be sure to bring in all your medications bottles to  every appointment.    Thank you for choosing [facility]

## 2023-08-15 NOTE — Progress Notes (Signed)
ADVANCED HF CLINIC NOTE  PCP: Hillery Aldo, NP [facility] PCP  Primary Cardiologist: Rollene Rotunda, MD  HF Cardiology: Dr. Shirlee Latch   HPI: 72 y.o. [name] with hx tobacco use, COPD, OSA on Bipap, HTN, and chronic HFmrEF.   Admitted 5/23 with acute hypoxic respiratory failure secondary to suspected COPD and acute systolic CHF. Initially in respiratory distress and required BiPAP. BP significantly elevated at 189/133, remained elevated throughout much of admission but improved with titration of GDMT. CTA chest negative for PE. Echo EF 35%, RV okay, RVSP 54 mmHg. R/LHC showed nonobstructive CAD; RA mean 12, PA 54/29 (37), PCWP mean 23, LVEDP 22 mmHg, Fick CO/CI 3.84/2.12. He diuresed with IV lasix.    He was readmitted 6/23 with acute on chronic respiratory failure with hypoxia secondary to acute COPD exacerbation. Also thought to possibly have some component of CHF. He diuresed with IV lasix and received doxycycline + prednisone. Discharge weight 151 lb.    Zio monitor in 6/23 showed NSR with short runs NSVT and SVT, bradyarrhythmias, idioventricular rhythm, with episodes of third-degree HB noted during sleep hours.  Bradycardia thought to be due to untreated severe OSA.   Admitted 7/23 with acute respiratory failure with hypoxia. O2 sats 80s. He initially required BiPAP. Hypertensive with BP 210/120 and placed on nitro gtt. He was admitted for AECOPD and CHF. Diuresed with IV lasix. Cardiology consulted and felt presentation more likely consistent with AECOPD rather than CHF. Had runs of NSVT up to 20 beats in duration.   Echo 8/23 EF improved 40-45% with LVH. Recommended PYP/ CMRI  Sleep study 9/23 showed severe OSA with AHI of 42.6/hr. He declined in lab BiPap titration.  ED evaluation 10/16/22 for syncope. Low suspicion for PE. CXR ok. COVID negative. K 5.5. Suspected vasovagal episode during coughing fit.    Spironolactone/KCL stopped with recent hyperkalemia.   Admitted 1/24 for AECOPD.  Transiently required BiPap, then able to wean to nasal cannula. Found to have AKI and GDMT initially held then restarted per home regimen. Started on prednisone taper. He was discharged home, weight 155 lbs.  Admitted 3/24 with COPD exacerbation. Required BiPap and steroids. Started on IV Solu-Medrol. UDS + cocaine. Cleda Daub and Sherryll Burger stopped with AKI.    Had PYP scan 04/09/23 --> [place] suggestive of TTR. He was started on tafamidis but developed severe rash/dermatitis after starting that improved when he stopped and came back when he restarted. He is now off tafamidis.   Echo was done today and reviewed, EF 40% with moderate LVH, global hypokinesis, mild RV enlargement with mildly decreased systolic function, IVC normal.   Today he returns for followup of CHF.  He surprisingly has minimal symptoms. He denies dyspnea walking on flat ground.  He mows the grass and walks to the store.  No problems with a flight of stairs.  No orthopnea/PND.  No chest pain.  No lightheadedness. He uses Bipap at night. Weight down 2 lbs.   Labs (3/24): K 4.1, creatinine 1.74 Labs (03/29/23): K 4.1 Creatinine 1.56.  Labs (7/24): urine immunofixation negative, myeloma panel negative Labs (9/24): BNP 553, K 3.5, creatinine 1.75  ECG (personally reviewed): NSR, RBBB  PMH: 1. OSA: Severe. CPAP. 2. COPD 3. HTN 4. 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  - R/LHC (5/23): nonobstructive CAD; RA mean 12, PA 54/29 (37), PCWP mean 23, LVEDP 22 mmHg, Fick CO/CI 3.84/2.12. - Echo (5/23): EF 35%, RV okay, RVSP 54 mmHg. - Echo (8/23):  EF 40-45% w/ LVH - Echo (10/24): EF 40% with moderate LVH, global hypokinesis, mild RV enlargement with mildly decreased systolic function, IVC normal.  - PYP scan (6/24): Grade 2, H/CL 1.3.  Looks abnormal visually.   - Inv[employer]ae gene testing negative for TTR mutations.  5. Hyperlipidemia 6. Bradycardia: Complete HB noted while sleeping,  thought to be due to severe OSA.  7. CAD: LHC (5/23) with 50% OM1 stenosis.  8. Syncope: ?vasovagal 9. Type 2 diabetes.    Current Outpatient Medications  Medication Sig Dispense Refill   albuterol (VENTOLIN HFA) 108 (90 Base) MCG/ACT inhaler Inhale 2 puffs into the lungs every 4 (four) hours as needed for wheezing or shortness of breath. 1 each 2   aspirin EC 81 MG tablet Take 1 tablet (81 mg total) by mouth daily. Swallow whole. 90 tablet 3   atorvastatin (LIPITOR) 80 MG tablet TAKE 1 Tablet BY MOUTH ONCE DAILY EVERY EVENING 90 tablet 3   bisoprolol (ZEBETA) 5 MG tablet Take 1 tablet (5 mg total) by mouth daily. 90 tablet 3   FARXIGA 10 MG TABS tablet TAKE 1 Tablet BY MOUTH ONCE DAILY (MORNING) 30 tablet 5   furosemide (LASIX) 40 MG tablet TAKE 1 Tablet BY MOUTH TWICE DAILY (morning AND evening) 180 tablet 3   ipratropium-albuterol (DUONEB) 0.5-2.5 (3) MG/3ML SOLN Inhale 3 mLs into the lungs in the morning, at noon, in the evening, and at bedtime. 360 mL 11   isosorbide-hydrALAZINE (BIDIL) 20-37.5 MG tablet Take 0.5 tablets by mouth 3 (three) times daily. 45 tablet 3   metFORMIN (GLUCOPHAGE) 500 MG tablet Take 1 tablet (500 mg total) by mouth 2 (two) times daily with a meal. 60 tablet 0   montelukast (SINGULAIR) 10 MG tablet TAKE 1 Tablet BY MOUTH ONCE DAILY at bedtime (evening) 30 tablet 11   sacubitril-valsartan (ENTRESTO) 24-26 MG Take 1 tablet by mouth 2 (two) times daily. 60 tablet 11   spironolactone (ALDACTONE) 25 MG tablet Take 0.5 tablets (12.5 mg total) by mouth daily. 45 tablet 3   TRELEGY ELLIPTA 100-62.5-25 MCG/ACT AEPB Take 1 puff by mouth daily.     No current facility-administered medications for this encounter.   Allergies  Allergen Reactions   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    Allergic dermatitis/rash    Social History   Socioeconomic History   Marital status: Single    Spouse name: Not on file   Number of children: 1   Years of education: Not on file    Highest education level: 10th grade  Occupational History   Occupation: Disability  Tobacco Use   Smoking status: Some Days    Current packs/day: 0.00    Average packs/day: 0.5 packs/day for 50.0 years (25.0 ttl pk-yrs)    Types: Cigarettes    Start date: 03/19/1972    Last attempt to quit: 03/19/2022    Years since quitting: 1.4   Smokeless tobacco: Never   Tobacco comments:    Smoking cessation  Vaping Use   Vaping status: Never Used  Substance and Sexual Activity   Alcohol use: Not on file    Comment: socially   Drug use: Yes    Types: Marijuana    Comment: past   Sexual activity: Not on file  Other Topics Concern   Not on file  Social History Narrative   He lives with his daughter apparently and 5 grandchildren.  He reports that he smokes probably less than a pack of cigarettes a day.  He occasionally drinks  a beer or liquor but not daily.   Social Determinants of Health   Financial Resource Strain: Low Risk  (03/11/2022)   Overall Financial Resource Strain (CARDIA)    Difficulty of Paying Living Expenses: Not very hard  Food Insecurity: No Food Insecurity (01/13/2023)   Hunger Vital Sign    Worried About Running Out of Food in the Last Year: Never true    Ran Out of Food in the Last Year: Never true  Recent Concern: Food Insecurity - Food Insecurity Present (10/26/2022)   Hunger Vital Sign    Worried About Programme researcher, broadcasting/film/video in the Last Year: Often true    Ran Out of Food in the Last Year: Often true  Transportation Needs: Unmet Transportation Needs (04/13/2023)   PRAPARE - Administrator, Civil Service (Medical): Yes    Lack of Transportation (Non-Medical): Yes  Physical Activity: Not on file  Stress: Not on file  Social Connections: Not on file  Intimate Partner Violence: Not At Risk (01/13/2023)   Humiliation, Afraid, Rape, and Kick questionnaire    Fear of Current or Ex-Partner: No    Emotionally Abused: No    Physically Abused: No    Sexually Abused:  No   Family History  Problem Relation Age of Onset   Hypertension Mother    BP 134/80   Pulse 64   Wt 67.6 kg (149 lb)   SpO2 97%   BMI 24.79 kg/m   Wt Readings from Last 3 Encounters:  08/13/23 67.6 kg (149 lb)  08/10/23 64.8 kg (142 lb 12.8 oz)  07/27/23 65.1 kg (143 lb 9.6 oz)   PHYSICAL EXAM: General: NAD Neck: No JVD, no thyromegaly or thyroid nodule.  Lungs: Clear to auscultation bilaterally with normal respiratory effort. CV: Nondisplaced PMI.  Heart regular S1/S2, no S3/S4, no murmur.  No peripheral edema.  No carotid bruit.  Normal pedal pulses.  Abdomen: Soft, nontender, no hepatosplenomegaly, no distention.  Skin: Intact without lesions or rashes.  Neurologic: Alert and oriented x 3.  Psych: Normal affect. Extremities: No clubbing or cyanosis.  HEENT: Normal.   ASSESSMENT & PLAN:  1.  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 Echo (5/23) with LVEF 35%, RV okay.  [facility], [place] 5/23 showed nonobstructive CAD; RA mean 12, PA 54/29 (37), PCWP mean 23, LVEDP 22 mmHg. Echo in 8/23 with EF 40-45%.  Echo in 10/24 showed EF 40% with moderate LVH, global hypokinesis, mild RV enlargement with mildly decreased systolic function, IVC normal. PYP scan concerning for TTR cardiac amyloidosis.  Myeloma studies were negative.  Invitae gene testing negative for TTR gene mutations.  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  He did not tolerate tafamidis due to severe rash. NYHA class II, not particularly symptomatic currently and not volume overloaded on exam.  - Would arrange for cardiac MRI to try to confirm TTR cardiac amyloidosis diagnosis.  The PYP scan was not read as floridly positive but visually looks positive to me.  - He did not tolerate tafamidis and is not a candidate for vutrisiran at this time as he does not have hereditary TTR cardiac amyloidosis.  We will work on getting acoramidis for him when this becomes available.  - Continue  Entresto 24/26 mg bid.  - Decrease Lasix to 40 qam/20 qpm. - Start spironolactone 12.5 daily. BMET/BNP today, BMET in 10 days.  - Continue Farxiga 10 mg daily. No GU symptoms. - Continue bisoprolol 5 mg daily, low threshold  to cut back with history of bradycardia, but this seems improved by Bipap. .  - Continue BiDil 0.5 tab tid. 2. Syncope:  Multiple recent episodes in the past. Evaluated in the ED 12/23. Event felt to by vasovagal.  2 week Zio (6/23) showed mostly NSR, significant bradycardia during sleeping hours w/ intermittent CHB thought to be due to severe OSA. No recent recurrence.  - Low threshold to cut back on bisoprolol.  - Use Bipap.  3. HTN: BP controlled.  4. OSA: Severe by sleep study.   - Needs to use Bipap.  5. CAD: Nonobstructive on [facility] 5/23.  No chest pain.  - Continue aspirin and statin. 6. COPD: Still smoking. Has history of COPD exacerbations though minimally symptomatic at this time.  - Discussed smoking cessation.  - On Trelegy - Followed by Pulmonary  Continue paramedicine.  Followup in 6 wks with APP.   Marca Ancona,  08/15/23

## 2023-08-17 ENCOUNTER — Telehealth (HOSPITAL_COMMUNITY): Payer: Self-pay | Admitting: Emergency Medicine

## 2023-08-17 NOTE — Telephone Encounter (Signed)
My Pharmacy called to confirm med list for Mr. Hutsell for bubble packs.  Packs will be delivered  08/26/23.    Beatrix Shipper, EMT-Paramedic [phone] 08/17/2023

## 2023-08-18 ENCOUNTER — Other Ambulatory Visit: Payer: Self-pay

## 2023-08-18 DIAGNOSIS — I5020 Unspecified systolic (congestive) heart failure: Secondary | ICD-10-CM

## 2023-08-18 MED ORDER — DAPAGLIFLOZIN PROPANEDIOL 10 MG PO TABS
10.0000 mg | ORAL_TABLET | Freq: Every day | ORAL | 0 refills | Status: DC
Start: 2023-08-18 — End: 2023-09-24

## 2023-08-24 ENCOUNTER — Telehealth (HOSPITAL_COMMUNITY): Payer: Self-pay | Admitting: Licensed Clinical Social Worker

## 2023-08-24 ENCOUNTER — Ambulatory Visit (HOSPITAL_COMMUNITY)
Admission: RE | Admit: 2023-08-24 | Discharge: 2023-08-24 | Disposition: A | Discharge status: Home / Self Care | Payer: 59 | Source: Ambulatory Visit | Attending: Internal Medicine | Admitting: Internal Medicine

## 2023-08-24 DIAGNOSIS — I5042 Chronic combined systolic (congestive) and diastolic (congestive) heart failure: Secondary | ICD-10-CM | POA: Insufficient documentation

## 2023-08-24 LAB — BASIC METABOLIC PANEL
Anion gap: 11 (ref 5–15)
BUN: 18 mg/dL (ref 8–23)
CO2: 26 mmol/L (ref 22–32)
Calcium: 9.1 mg/dL (ref 8.9–10.3)
Chloride: 99 mmol/L (ref 98–111)
Creatinine, Ser: 1.62 mg/dL — ABNORMAL HIGH (ref 0.61–1.24)
GFR, Estimated: 45 mL/min — ABNORMAL LOW (ref >60)
Glucose, Bld: 97 mg/dL (ref 70–99)
Potassium: 3.6 mmol/L (ref 3.5–5.1)
Sodium: 136 mmol/L (ref 135–145)

## 2023-08-24 NOTE — Telephone Encounter (Signed)
H&V Care Navigation CSW Progress Note  Clinical Social Worker informed by paramedic pt in need of transport- CSW able to arrange taxi voucher for todays lab appt.   SDOH Screenings   Food Insecurity: No Food Insecurity (01/13/2023)  Recent Concern: Food Insecurity - Food Insecurity Present (10/26/2022)  Housing: Low Risk  (01/13/2023)  Transportation Needs: Unmet Transportation Needs (08/24/2023)  Utilities: Not At Risk (01/13/2023)  Alcohol Screen: Low Risk  (05/06/2022)  Financial Resource Strain: Low Risk  (03/11/2022)  Tobacco Use: High Risk (08/13/2023)   08/24/2023  Marcus Tran DOB: 06-06-1951 MRN: [id number]   RIDER WAIVER AND RELEASE OF LIABILITY  For the purposes of helping with transportation needs, [facility] partners with outside transportation providers (taxi companies, [employer], Catering manager.) to give [name] patients or other approved people the choice of on-demand rides [name]") to our buildings for non-emergency visits.  By using [employer], I, the person signing this document, on behalf of myself and/or any legal minors (in my care using the [employer]), agree:  Science writer given to me are supplied by independent, outside transportation providers who do not work for, or have any affiliation with, [name]. [facility] is not a transportation company. [facility] has no control over the quality or safety of the rides I get using [employer]. [facility] has no control over whether any outside ride will happen on time or not. [facility] gives no guarantee on the reliability, quality, safety, or availability on any rides, or that no mistakes will happen. I know and accept that traveling by vehicle (car, truck, [employer], [employer] Niece, bus, taxi, etc.) has risks of serious injuries such as disability, being paralyzed, and death. I know and agree the risk of using [employer] is mine alone, and not [employer]. Transport Services are provided "as  is" and as are available. The transportation providers are in charge for all inspections and care of the vehicles used to provide these rides. I agree not to take legal action against [facility], its agents, employees, officers, directors, representatives, insurers, attorneys, assigns, successors, subsidiaries, and affiliates at any time for any reasons related directly or indirectly to using [employer]. I also agree not to take legal action against [facility] or its affiliates for any injury, death, or damage to property caused by or related to using [employer]. I have read this Waiver and Release of Liability, and I understand the terms used in it and their legal meaning. This Waiver is freely and voluntarily given with the understanding that my right (or any legal minors) to legal action against [place] relating to [employer] is knowingly given up to use these services.   I attest that I read the Ride Waiver and Release of Liability to Marcus Tran, gave Mr. Brinegar the opportunity to ask questions and answered the questions asked (if any). I affirm that Marcus Tran then provided consent for assistance with transportation.     Burna Sis

## 2023-08-26 ENCOUNTER — Other Ambulatory Visit (HOSPITAL_COMMUNITY): Payer: Self-pay | Admitting: Emergency Medicine

## 2023-08-26 NOTE — Progress Notes (Signed)
Paramedicine Encounter    Patient ID: Marcus Tran, male    DOB: 24-Feb-1951, 72 y.o.   MRN: [id number]   Complaints Chest congestion w/ productive cough  Assessment A&O x 4, skin W&D w/ good color.  Pt denies chest pain or SOB.  Lung sounds with ronchi in the upper lobes.  Sputum yellow/green in color.  Afebrile.  Compliance with meds YES  Pill box filled n/a  Refills needed NONE (rec'd bubble packs for 1 month yesterday)  Meds changes since last visit NONE    Social changes NONE   BP 120/80 (BP Location: Right Arm, Patient Position: Sitting, Cuff Size: Normal)   Pulse 74   Resp 16   Wt 141 lb 3.2 oz (64 kg)   SpO2 95%   BMI 23.50 kg/m  Weight yesterday- not taken Last visit weight-142lb  Home visit with Marcus Tran finds him doing well with his med compliance.  He does have complaint of chest congestion with a productive cough.  Sputum yellow/green in color.  Afebrile.  Lung sounds with ronchi in the upper lobes.  I recommended he reach out to his PCP for further eval.  I told him I wouldn't want him to develop pneumonia. Marcus Tran received his bubble packs for the next month from [facility].  I reviewed all his meds w/ him and he's right on track with his med compliance.  Labs from yesterday had no med changes for him so that's great. Next home visit in 2 weeks 09/09/23 @ 9:00.  ACTION: Home visit completed  Bethanie Dicker [phone] 08/26/23  Patient Care Team: Hillery Aldo, NP as PCP - Reatha Armour, MD as PCP - Cardiology (Cardiology)  Patient Active Problem List   Diagnosis Date Noted   Cocaine use 01/15/2023   Noncompliance 01/13/2023   Adenovirus pneumonia 11/12/2022   Chronic HFrEF (heart failure with reduced ejection fraction) (HCC) 08/13/2022   DM2 (diabetes mellitus, type 2) (HCC) 08/13/2022   Obstructive sleep apnea (adult) (pediatric) 08/08/2022   Acute respiratory failure with hypoxia and hypercapnia (HCC) 05/04/2022   Third  degree heart block (HCC) 04/15/2022   Snoring 04/15/2022   Nonischemic cardiomyopathy (HCC) 04/15/2022   HFrEF (heart failure with reduced ejection fraction) (HCC) 04/15/2022   COPD with acute exacerbation (HCC) 03/22/2022   Chronic combined systolic and diastolic CHF (congestive heart failure) (HCC)    Tobacco use disorder 03/08/2022   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 Swallow whole., Disp: 90 tablet, Rfl: 3   atorvastatin (LIPITOR) 80 MG tablet, TAKE 1 Tablet BY MOUTH ONCE DAILY EVERY EVENING, Disp: 90 tablet, Rfl: 3   bisoprolol (ZEBETA) 5 MG tablet, Take 1 tablet (5 mg total) by mouth daily., Disp: 90 tablet, Rfl: 3   dapagliflozin propanediol (FARXIGA) 10 MG TABS tablet, Take 1 tablet (10 mg total) by mouth daily., Disp: 15 tablet, Rfl: 0   furosemide (LASIX) 40 MG tablet, TAKE 1 Tablet BY MOUTH TWICE DAILY (morning AND evening), Disp: 180 tablet, Rfl: 3   ipratropium-albuterol (DUONEB) 0.5-2.5 (3) MG/3ML SOLN, Inhale  3 mLs into the lungs in the morning, at noon, in the evening, and at bedtime., Disp: 360 mL, Rfl: 11   isosorbide-hydrALAZINE (BIDIL) 20-37.5 MG tablet, Take 0.5 tablets by mouth 3 (three) times daily., Disp: 45 tablet, Rfl: 3   metFORMIN (GLUCOPHAGE) 500 MG tablet, Take 1 tablet (500 mg total) by mouth 2 (two) times daily with a meal., Disp: 60 tablet, Rfl: 0   montelukast (SINGULAIR) 10 MG tablet, TAKE 1 Tablet BY MOUTH ONCE DAILY at bedtime (evening), Disp: 30 tablet, Rfl: 11   sacubitril-valsartan (ENTRESTO) 24-26 MG, Take 1 tablet by mouth 2 (two) times  daily., Disp: 60 tablet, Rfl: 11   spironolactone (ALDACTONE) 25 MG tablet, Take 0.5 tablets (12.5 mg total) by mouth daily., Disp: 45 tablet, Rfl: 3   TRELEGY ELLIPTA 100-62.5-25 MCG/ACT AEPB, Take 1 puff by mouth daily., Disp: , Rfl:  Allergies  Allergen Reactions   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    Allergic dermatitis/rash     Social History   Socioeconomic History   Marital status: Single    Spouse name: Not on file   Number of children: 1   Years of education: Not on file   Highest education level: 10th grade  Occupational History   Occupation: Disability  Tobacco Use   Smoking status: Some Days    Current packs/day: 0.00    Average packs/day: 0.5 packs/day for 50.0 years (25.0 ttl pk-yrs)    Types: Cigarettes    Start date: 03/19/1972    Last attempt to quit: 03/19/2022    Years since quitting: 1.4   Smokeless tobacco: Never   Tobacco comments:    Smoking cessation  Vaping Use   Vaping status: Never Used  Substance and Sexual Activity   Alcohol use: Not on file    Comment: socially   Drug use: Yes    Types: Marijuana    Comment: past   Sexual activity: Not on file  Other Topics Concern   Not on file  Social History Narrative   He lives with his daughter apparently and 5 grandchildren.  He reports that he smokes probably less than a pack of cigarettes a day.  He occasionally drinks a beer or liquor but not daily.   Social Determinants of Health   Financial Resource Strain: Low Risk  (03/11/2022)   Overall Financial Resource Strain (CARDIA)    Difficulty of Paying Living Expenses: Not very hard  Food Insecurity: No Food Insecurity (01/13/2023)   Hunger Vital Sign    Worried About Running Out of Food in the Last Year: Never true    Ran Out of Food in the Last Year: Never true  Recent Concern: Food Insecurity - Food Insecurity Present (10/26/2022)   Hunger Vital Sign    Worried About Programme researcher, broadcasting/film/video in the Last Year: Often true    Ran Out of Food in the Last  Year: Often true  Transportation Needs: Unmet Transportation Needs (08/24/2023)   PRAPARE - Administrator, Civil Service (Medical): Yes    Lack of Transportation (Non-Medical): Yes  Physical Activity: Not on file  Stress: Not on file  Social Connections: Not on file  Intimate Partner Violence: Not At Risk (01/13/2023)   Humiliation, Afraid, Rape, and Kick questionnaire    Fear of Current or Ex-Partner: No    Emotionally Abused: No    Physically Abused: No    Sexually Abused: No    Physical Exam      Future Appointments  Date  Time Provider Department Center  09/24/2023 10:00 AM [facility] PA/NP [facility] None  10/18/2023  8:00 AM [facility] 1 [facility]

## 2023-09-08 ENCOUNTER — Telehealth (HOSPITAL_COMMUNITY): Payer: Self-pay | Admitting: Emergency Medicine

## 2023-09-08 NOTE — Telephone Encounter (Signed)
I called Mr. Curet to schedule a home visit. No answer. I left a message with my name, title, and call back number.   Will try again tomorrow.   Benson Setting EMT-P Community Paramedic  [phone]

## 2023-09-09 ENCOUNTER — Telehealth (HOSPITAL_COMMUNITY): Payer: Self-pay | Admitting: Emergency Medicine

## 2023-09-09 NOTE — Telephone Encounter (Signed)
I called and left a message for Marcus Tran to see if I could fill his pill box today. Pt did not answer. I left a message with my name, title and call back information.   Will have Dede follow up next week if no response.   Benson Setting EMT-P Community Paramedic  [phone]

## 2023-09-13 ENCOUNTER — Telehealth (HOSPITAL_COMMUNITY): Payer: Self-pay | Admitting: Pharmacist

## 2023-09-13 NOTE — Telephone Encounter (Signed)
Discussed previously with Dr. Shirlee Latch. 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 Patient previously had an allergy to tafamidis. Will start Attruby to see if he tolerates it better. Free trial form sent to the company so therapy can be initiated. Once able to submit a PA, further fills will come from [facility].   Karle Plumber, PharmD, BCPS, BCCP, CPP Heart Failure Clinic Pharmacist [phone]

## 2023-09-14 ENCOUNTER — Encounter (HOSPITAL_COMMUNITY): Payer: Self-pay | Admitting: Emergency Medicine

## 2023-09-14 ENCOUNTER — Telehealth: Payer: Self-pay

## 2023-09-14 ENCOUNTER — Ambulatory Visit (HOSPITAL_COMMUNITY)
Admission: EM | Admit: 2023-09-14 | Discharge: 2023-09-14 | Disposition: A | Discharge status: Home / Self Care | Payer: 59 | Attending: Family Medicine | Admitting: Family Medicine

## 2023-09-14 ENCOUNTER — Ambulatory Visit (INDEPENDENT_AMBULATORY_CARE_PROVIDER_SITE_OTHER): Payer: 59

## 2023-09-14 DIAGNOSIS — W19XXXA Unspecified fall, initial encounter: Secondary | ICD-10-CM

## 2023-09-14 DIAGNOSIS — S00211A Abrasion of right eyelid and periocular area, initial encounter: Secondary | ICD-10-CM | POA: Diagnosis not present

## 2023-09-14 DIAGNOSIS — R0782 Intercostal pain: Secondary | ICD-10-CM | POA: Diagnosis not present

## 2023-09-14 DIAGNOSIS — M25511 Pain in right shoulder: Secondary | ICD-10-CM

## 2023-09-14 DIAGNOSIS — Z0000 Encounter for general adult medical examination without abnormal findings: Secondary | ICD-10-CM

## 2023-09-14 DIAGNOSIS — S63104A Unspecified dislocation of right thumb, initial encounter: Secondary | ICD-10-CM

## 2023-09-14 DIAGNOSIS — R0781 Pleurodynia: Secondary | ICD-10-CM | POA: Diagnosis not present

## 2023-09-14 DIAGNOSIS — Z23 Encounter for immunization: Secondary | ICD-10-CM | POA: Diagnosis not present

## 2023-09-14 MED ORDER — TETANUS-DIPHTH-ACELL PERTUSSIS 5-2.5-18.5 LF-MCG/0.5 IM SUSY
PREFILLED_SYRINGE | INTRAMUSCULAR | Status: AC
  Filled 2023-09-14: qty 0.5

## 2023-09-14 MED ORDER — LIDOCAINE HCL (PF) 2 % IJ SOLN
INTRAMUSCULAR | Status: AC
  Filled 2023-09-14: qty 5

## 2023-09-14 MED ORDER — TETANUS-DIPHTH-ACELL PERTUSSIS 5-2.5-18.5 LF-MCG/0.5 IM SUSY
0.5000 mL | PREFILLED_SYRINGE | Freq: Once | INTRAMUSCULAR | Status: AC
  Administered 2023-09-14: 0.5 mL via INTRAMUSCULAR

## 2023-09-14 NOTE — ED Provider Notes (Signed)
  [facility]   [phone] 09/14/23 Arrival Time: 1038  Procedure Note: Finger Reduction  After adequate digital block, R distal thumb dislocation reduced with traction and downward pressure. He demonstrates FROM after procedure. Normal distal sensation and capillary refill. No complications. Tolerated procedure well.  Post-reduction imaging obtained.       Mardella Layman, MD 09/14/23 1438

## 2023-09-14 NOTE — ED Triage Notes (Addendum)
Pt present after a fall today. He hit his chest and right side. Has right eye laceration, chest pain, right thumb pain, and right shoulder pain.  Right thumb looks bent farther back than left thumb

## 2023-09-14 NOTE — Discharge Instructions (Signed)
Splint has been applied to your thumb.  Please follow-up with hand specialist at provided contact for further evaluation and management.  Elevate and apply ice.  Other x-rays are not showing any obvious fractures.  Please go to the ER if you develop headache, dizziness, blurred vision, nausea, vomiting.  Change dressing daily to the abrasion of your eyebrow and monitor for signs of infection that include increased redness, swelling, pus and follow-up if this occurs.

## 2023-09-14 NOTE — ED Provider Notes (Addendum)
[facility]    CSN: [id number] Arrival date & time: 09/14/23  1038      History   Chief Complaint Chief Complaint  Patient presents with   Fall    HPI Marcus Tran is a 72 y.o. male.   Patient presents for further evaluation of multiple areas of pain after a fall that occurred a few hours prior to arrival to urgent care.  Patient reports that he was standing in a chair trying to hang a picture when he lost his footing and started to fall.  States that it started when he hit his forehead on the wall causing it to slide down caused a subsequent abrasion to his right eyebrow.  He denies headache, dizziness, blurred vision, nausea, vomiting.  He does not take any blood thinning medications.  Reports that he subsequently hit his chest on the back of the chair.  He is also having right shoulder and right thumb pain that occurred but is not exactly sure what he hit it on.  He has not taken anything for pain.  Patient is not sure of last tetanus vaccination.   Fall    Past Medical History:  Diagnosis Date   Acute metabolic encephalopathy 03/08/2022   Acute respiratory failure with hypoxia (HCC) 03/20/2022   Acute respiratory failure with hypoxia and hypercapnia (HCC) 03/06/2022   CHF (congestive heart failure) (HCC)    Dyslipidemia    Hypertension     Patient Active Problem List   Diagnosis Date Noted   Cocaine use 01/15/2023   Noncompliance 01/13/2023   Adenovirus pneumonia 11/12/2022   Chronic HFrEF (heart failure with reduced ejection fraction) (HCC) 08/13/2022   DM2 (diabetes mellitus, type 2) (HCC) 08/13/2022   Obstructive sleep apnea (adult) (pediatric) 08/08/2022   Acute respiratory failure with hypoxia and hypercapnia (HCC) 05/04/2022   Third degree heart block (HCC) 04/15/2022   Snoring 04/15/2022   Nonischemic cardiomyopathy (HCC) 04/15/2022   HFrEF (heart failure with reduced ejection fraction) (HCC) 04/15/2022   COPD with acute exacerbation (HCC)  03/22/2022   Chronic combined systolic and diastolic CHF (congestive heart failure) (HCC)    Tobacco use disorder 03/08/2022   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 Authorizing Provider  albuterol (VENTOLIN HFA) 108 (90 Base) MCG/ACT inhaler Inhale 2 puffs into the lungs every 4 (four) hours as needed for wheezing or shortness of breath. 01/16/23   Almon Hercules, MD  aspirin EC 81 MG tablet Take 1 tablet (81 mg total) by mouth daily. Swallow whole. 03/24/22   Alver Sorrow, NP  atorvastatin (LIPITOR) 80 MG tablet TAKE 1 Tablet BY MOUTH ONCE DAILY EVERY EVENING 03/25/23   Marinus Maw, MD  bisoprolol (ZEBETA) 5 MG tablet Take 1 tablet (5 mg total) by mouth daily. 05/21/23   Laurey Morale, MD  dapagliflozin  propanediol (FARXIGA) 10 MG TABS tablet Take 1 tablet (10 mg total) by mouth daily. 08/18/23   Rollene Rotunda, MD  furosemide (LASIX) 40 MG tablet TAKE 1 Tablet BY MOUTH TWICE DAILY (morning AND evening) 03/25/23   Rollene Rotunda, MD  ipratropium-albuterol (DUONEB) 0.5-2.5 (3) MG/3ML SOLN Inhale 3 mLs into the lungs in the morning, at noon, in the evening, and at bedtime. 05/21/22   Hunsucker, Lesia Sago, MD  isosorbide-hydrALAZINE (BIDIL) 20-37.5 MG tablet Take 0.5 tablets by mouth 3 (three) times daily. 06/18/23   Robbie Lis M, PA-C  metFORMIN (GLUCOPHAGE) 500 MG tablet Take 1  tablet (500 mg total) by mouth 2 (two) times daily with a meal. 05/07/22   Leroy Sea, MD  montelukast (SINGULAIR) 10 MG tablet TAKE 1 Tablet BY MOUTH ONCE DAILY at bedtime (evening) 04/23/23   Hunsucker, Lesia Sago, MD  sacubitril-valsartan (ENTRESTO) 24-26 MG Take 1 tablet by mouth 2 (two) times daily. 03/18/23   Jacklynn Ganong, FNP  spironolactone (ALDACTONE) 25 MG tablet Take 0.5 tablets (12.5 mg total) by mouth daily. 08/13/23   Laurey Morale, MD  TRELEGY ELLIPTA 100-62.5-25 MCG/ACT AEPB Take 1 puff by mouth daily. 02/23/23   [provider]  UNABLE TO FIND Med Name: [name] 712 mg (2 capsules) by mouth twice daily    [provider]    Family History Family History  Problem Relation Age of Onset   Hypertension Mother     Social History Social History   Tobacco Use   Smoking status: Some Days    Current packs/day: 0.00    Average packs/day: 0.5 packs/day for 50.0 years (25.0 ttl pk-yrs)    Types: Cigarettes    Start date: 03/19/1972    Last attempt to quit: 03/19/2022    Years since quitting: 1.4   Smokeless tobacco: Never   Tobacco comments:    Smoking cessation  Vaping Use   Vaping status: Never Used  Substance Use Topics   Drug use: Yes    Types: Marijuana    Comment: past     Allergies   Vyndamax [tafamidis]   Review of Systems Review of Systems Per HPI  Physical Exam Triage Vital Signs ED Triage Vitals  Encounter Vitals Group     BP 09/14/23 1142 125/78     Systolic BP Percentile --      Diastolic BP Percentile --      Pulse Rate 09/14/23 1142 (!) 57     Resp 09/14/23 1142 17     Temp 09/14/23 1142 (!) 97.4 F (36.3 C)     Temp Source 09/14/23 1142 Oral     SpO2 09/14/23 1142 94 %     Weight --      Height --      Head Circumference --      Peak Flow --      Pain Score 09/14/23 1141 8     Pain Loc --      Pain Education --      Exclude from Growth Chart --    No data found.  Updated Vital Signs BP 125/78 (BP  Location: Right Arm)   Pulse (!) 57   Temp (!) 97.4 F (36.3 C) (Oral)   Resp 17   SpO2 94%   Visual Acuity Right Eye Distance:   Left Eye Distance:   Bilateral Distance:    Right Eye Near:   Left Eye Near:    Bilateral Near:     Physical Exam Constitutional:  General: He is not in acute distress.    Appearance: Normal appearance. He is not toxic-appearing or diaphoretic.  HENT:     Head: Normocephalic and atraumatic.     Comments: Patient has very mild swelling with a superficial abrasion that is approximately 1 cm in diameter present to right lateral eyebrow.  No laceration noted.  Bleeding controlled.  No tenderness surrounding orbits of eye.  Eyeball appears normal. Eyes:     Extraocular Movements: Extraocular movements intact.     Conjunctiva/sclera: Conjunctivae normal.  Cardiovascular:     Rate and Rhythm: Normal rate and regular rhythm.     Pulses: Normal pulses.     Heart sounds: Normal heart sounds.  Pulmonary:     Effort: Pulmonary effort is normal. No respiratory distress.     Breath sounds: Normal breath sounds.  Musculoskeletal:     Comments: Patient has tenderness to palpation to right anterior lower chest.  No abrasions, lacerations, discoloration, swelling noted.  Patient reports very minimal tenderness to palpation to right thumb but thumb does appear to be hyperextended on exam.  Limited range of motion due to this.  Capillary refill and pulses intact.  No abrasions, lacerations, swelling, discoloration noted.  Patient also has mild tenderness to palpation surrounding right shoulder.  No abrasions, swelling, discoloration, lacerations noted.  Patient has pain with abduction at about 45 degrees to shoulder.  Reevaluation of right thumb after reduction appears normal and patient has full range of motion.  Neurovascularly intact.  Neurological:     General: No focal deficit present.     Mental Status: He is alert and oriented to person, place, and time.  Mental status is at baseline.     Cranial Nerves: Cranial nerves 2-12 are intact.     Sensory: Sensation is intact.     Motor: Motor function is intact.     Coordination: Coordination is intact.     Gait: Gait is intact.  Psychiatric:        Mood and Affect: Mood normal.        Behavior: Behavior normal.        Thought Content: Thought content normal.        Judgment: Judgment normal.      UC Treatments / Results  Labs (all labs ordered are listed, but only abnormal results are displayed) Labs Reviewed - No data to display  EKG   Radiology DG Shoulder Right  Result Date: 09/14/2023 CLINICAL DATA:  Fall today. Hit chest and right side. Right shoulder pain. EXAM: RIGHT SHOULDER - 2+ VIEW COMPARISON:  None Available. FINDINGS: There is diffuse decreased bone mineralization. Mild inferior greater than anterior and posterior glenoid degenerative osteophytosis. Mild glenohumeral joint space narrowing. There are multiple well corticated ossicles bordering the superolateral aspect of the humeral head greater tuberosity, chronic. Moderate acromioclavicular joint space narrowing and peripheral osteophytosis. Moderate distal lateral subacromial spurring. No acute fracture is seen. No dislocation. Mild-to-moderate multilevel disc space narrowing of the visualized thoracic spine. IMPRESSION: 1. 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 2. Moderate distal lateral subacromial spurring. 3. Multiple well corticated ossicles bordering the superolateral aspect of the humeral head greater tuberosity, chronic. This can be seen with chronic rotator cuff tendinopathy. Electronically Signed   By: Neita Garnet M.D.   On: 09/14/2023 14:23   DG Ribs Bilateral W/Chest  Result Date: 09/14/2023 CLINICAL DATA:  Fall today. Hit chest on right side. Chest pain. Right shoulder pain. EXAM: BILATERAL RIBS AND CHEST - 4+ VIEW COMPARISON:  Chest radiographs 01/14/2023, 01/12/2023 guidewire  01/06/2023, 10/16/2022 FINDINGS: Cardiac silhouette is again at the upper limits of normal size. Mediastinal contours are within normal limits. There are again large bilateral central main pulmonary arteries as can be seen with chronic pulmonary arterial hypertension. The lungs are clear.  No pleural effusion or pneumothorax. No definite acute rib fracture is identified. There are lucencies from bowel gas that overlies the anterior right seventh through tenth ribs on the final view of the right ribs. No definitive lucent fracture line is seen in this region. IMPRESSION: 1. No definite acute rib fracture is identified. 2. No acute cardiopulmonary process. 3. Redemonstration of large bilateral central main pulmonary arteries as can be seen with chronic pulmonary arterial hypertension. Electronically Signed   By: Neita Garnet M.D.   On: 09/14/2023 14:21   DG Finger Thumb Right  Result Date: 09/14/2023 CLINICAL DATA:  Recheck thumb interphalangeal dislocation. Hit chest and right side. EXAM: RIGHT THUMB 2+V COMPARISON:  None Available. FINDINGS: Interval relocation of the thumb interphalangeal joint. There is a 1 mm linear bone density at the medial aspect of the joint on frontal view. On oblique view there is a 2 mm linear density overlying the volar aspect of the joint. Mild thumb interphalangeal joint space narrowing and peripheral osteophytosis. Mild to moderate thumb metacarpophalangeal joint space narrowing and peripheral osteophytosis. Moderate to severe thumb carpometacarpal joint space narrowing, subchondral sclerosis, and peripheral osteophytosis. Mild right triscaphe and second through fifth carpometacarpal joint osteoarthritis. IMPRESSION: 1. Interval relocation of the thumb interphalangeal joint. 2. There are 1 or 2 tiny linear bone densities overlying the thumb interphalangeal joint, likely tiny cortical fracture fragments. No definite donor site is visualized. 3. 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 Electronically Signed   By: Neita Garnet M.D.   On: 09/14/2023 14:15   DG Finger Thumb Right  Result Date: 09/14/2023 CLINICAL DATA:  Fall.  Hit chest on right side.  Right thumb pain. EXAM: RIGHT THUMB 2+V COMPARISON:  None Available. FINDINGS: There is dorsal dislocation of the distal phalanx with respect to the proximal phalanx of the thumb. Mild approximate 3 mm craniocaudal bone overlap. Mild thumb metacarpophalangeal joint space narrowing and peripheral osteophytosis. Moderate to severe thumb carpometacarpal joint space narrowing, subchondral sclerosis and cystic change, and peripheral osteophytosis. On lateral view there is a 2 mm mineralized density overlying the soft tissues volar to the distal radius, appearing chronic. No acute fracture is seen. IMPRESSION: 1. Dorsal dislocation of the distal phalanx with respect to the proximal phalanx of the thumb. 2. 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 Electronically Signed   By: Neita Garnet M.D.   On: 09/14/2023 14:08    Procedures Orthopedic Injury Treatment  Date/Time: 09/14/2023 2:43 PM  Performed by: Gustavus Bryant, FNP Authorized by: Mardella Layman, MD   Consent:    Consent obtained:  Verbal   Consent given by:  Patient   Risks discussed:  Fracture, nerve damage, restricted joint movement and irreducible dislocation   Alternatives discussed:  No treatment and alternative treatmentInjury location: hand Location details: right hand Injury type: dislocation Dislocation type: carpometacarpal (thumb) Pre-procedure neurovascular assessment: neurovascularly intact Pre-procedure distal perfusion: normal Pre-procedure neurological function: normal Pre-procedure range of motion: reduced Anesthesia: digital block  Anesthesia: Local anesthesia used: yes Local Anesthetic: lidocaine 2% without epinephrine  Patient sedated:  NoManipulation performed: yes Reduction successful: yes X-ray confirmed reduction: yes Immobilization: splint Splint type: thumb spica Splint  Applied by: ED Nurse Post-procedure neurovascular assessment: post-procedure neurovascularly intact Post-procedure distal perfusion: normal Post-procedure neurological function: normal Post-procedure range of motion: normal    (including critical care time)  Medications Ordered in UC Medications  Tdap (BOOSTRIX) injection 0.5 mL (0.5 mLs Intramuscular Given 09/14/23 1438)    Initial Impression / Assessment and Plan / UC Course  I have reviewed the triage vital signs and the nursing notes.  Pertinent labs & imaging results that were available during my care of the patient were reviewed by me and considered in my medical decision making (see chart for details).     Given patient reports mild head injury, did discuss with patient that we recommend emergency department evaluation given patient's age as he may need CT imaging of the head.  Patient declined going to the ER.  Risks associated with not going to the ER to be evaluated properly were discussed with patient.  Patient voiced understanding, accepted risks, and wished to have limited evaluation here in urgent care.  X-ray of the right shoulder, chest/ribs, right thumb were completed today.  Right thumb x-ray did show dislocation. It was reduced with the help of Dr. Mardella Layman.  See procedure note.  Repeat x-ray of the thumb shows proper location after reduction with possible tiny cortical fracture fragments in the interphalangeal joint.  Patient was placed in a thumb spica splint for stability and advised to follow-up with hand specialty at provided contact information for further evaluation and management as soon as possible.  Right shoulder x-ray and rib x-ray were negative for any acute bony abnormality.  It did show chronic pulmonary arterial hypertension which is previously noted on chest  imaging and it appears the patient has been evaluated for this in the past.  Advised supportive care including ice application to affected areas.  No sutures or repair needed for abrasion to right eyebrow.  No bleeding was noted.  Wound was cleaned and dressing applied by clinical staff. Tetanus vaccine updated today. Advised patient to monitor this for signs of infection and follow-up if this occurs.  Encouraged strict ER precautions if neurological symptoms occur including headache, dizziness, blurred vision, nausea, vomiting.  Patient verbalized understanding and was agreeable with plan. Final Clinical Impressions(s) / UC Diagnoses   Final diagnoses:  None     Discharge Instructions      Splint has been applied to your thumb.  Please follow-up with hand specialist at provided contact for further evaluation and management.  Elevate and apply ice.  Other x-rays are not showing any obvious fractures.  Please go to the ER if you develop headache, dizziness, blurred vision, nausea, vomiting.  Change dressing daily to the abrasion of your eyebrow and monitor for signs of infection that include increased redness, swelling, pus and follow-up if this occurs.     ED Prescriptions   None    PDMP not reviewed this encounter.   Gustavus Bryant, [place] 09/14/23 1452    Gustavus Bryant, [place] 09/14/23 1453

## 2023-09-14 NOTE — Telephone Encounter (Signed)
Called patient to discuss transportation arranged with [employer] to and from appointment on 12/6 at 10am to [facility]. Patient did not answer. Left message for patient to return call for transportation details. Called Dede Katrinka Blazing and provided transportation information.   Ride id to facility: [id number] Ride id home: [id number]  Patient will call [facility] for ride back home [phone]) Member services [phone])  Patient's pick up time to appointment is 9:17am. Patient will receive a text from driver in route. Driver will wait for patient for 5 minutes, but patient should be waiting outside.  Patient was emailed by [facility] with instruction on how to arrange transportation for himself. Patient has 46 rides left.  Will attempt to get in contact with patient prior to appointment to provide this information if he does not return call prior to 12/3.   Renaee Munda, MS, ERHD, [facility]  Care Guide, Health & Wellness Coach [address] [place] [address] Telephone: [phone] Email: [email or web address]

## 2023-09-17 ENCOUNTER — Other Ambulatory Visit (HOSPITAL_COMMUNITY): Payer: Self-pay | Admitting: Cardiology

## 2023-09-17 DIAGNOSIS — I5020 Unspecified systolic (congestive) heart failure: Secondary | ICD-10-CM

## 2023-09-20 ENCOUNTER — Other Ambulatory Visit (HOSPITAL_COMMUNITY): Payer: Self-pay

## 2023-09-20 NOTE — Telephone Encounter (Signed)
Patient Advocate Encounter   Received notification from [employer] that prior authorization for Attruby is required.   PA submitted on CoverMyMeds Key [id number] Status is pending   Will continue to follow.   Karle Plumber, PharmD, BCPS, BCCP, CPP [facility] Pharmacist [phone]

## 2023-09-21 ENCOUNTER — Telehealth: Payer: Self-pay

## 2023-09-21 DIAGNOSIS — Z0000 Encounter for general adult medical examination without abnormal findings: Secondary | ICD-10-CM

## 2023-09-21 NOTE — Telephone Encounter (Signed)
Called patient to confirm arranged transportation  with [facility]. Patient was made aware of the pick up time of 9:17am on 12/6 for his appointment at 10:00am. Patient was informed that he will have to call [facility] for his return ride home. Additional information on ride is available in encounter on 11/26.   Renaee Munda, MS, ERHD, [facility]  Care Guide, Health & Wellness Coach [address] [place] [address] Telephone: [phone] Email: [email or web address]

## 2023-09-22 ENCOUNTER — Other Ambulatory Visit (HOSPITAL_COMMUNITY): Payer: Self-pay

## 2023-09-22 NOTE — Progress Notes (Signed)
ADVANCED HF CLINIC NOTE  PCP: Hillery Aldo, NP [facility] Street PCP  Primary Cardiologist: Rollene Rotunda, MD  HF Cardiology: Dr. Shirlee Latch   HPI: 72 y.o. [name] with hx tobacco use, COPD, OSA on Bipap, HTN, and chronic HFmrEF.   Admitted 5/23 with acute hypoxic respiratory failure secondary to suspected COPD and acute systolic CHF. Initially in respiratory distress and required BiPAP. BP significantly elevated at 189/133, remained elevated throughout much of admission but improved with titration of GDMT. CTA chest negative for PE. Echo EF 35%, RV okay, RVSP 54 mmHg. R/LHC showed nonobstructive CAD; RA mean 12, PA 54/29 (37), PCWP mean 23, LVEDP 22 mmHg, Fick CO/CI 3.84/2.12. He diuresed with IV lasix.    He was readmitted 6/23 with acute on chronic respiratory failure with hypoxia secondary to acute COPD exacerbation. Also thought to possibly have some component of CHF. He diuresed with IV lasix and received doxycycline + prednisone. Discharge weight 151 lb.    Zio monitor in 6/23 showed NSR with short runs NSVT and SVT, bradyarrhythmias, idioventricular rhythm, with episodes of third-degree HB noted during sleep hours.  Bradycardia thought to be due to untreated severe OSA.   Admitted 7/23 with acute respiratory failure with hypoxia. O2 sats 80s. He initially required BiPAP. Hypertensive with BP 210/120 and placed on nitro gtt. He was admitted for AECOPD and CHF. Diuresed with IV lasix. Cardiology consulted and felt presentation more likely consistent with AECOPD rather than CHF. Had runs of NSVT up to 20 beats in duration.   Echo 8/23 EF improved 40-45% with LVH. Recommended PYP/ CMRI  Sleep study 9/23 showed severe OSA with AHI of 42.6/hr. He declined in lab BiPap titration.  ED evaluation 10/16/22 for syncope. Low suspicion for PE. CXR ok. COVID negative. K 5.5. Suspected vasovagal episode during coughing fit.    Spironolactone/KCL stopped with recent hyperkalemia.   Admitted 1/24 for AECOPD.  Transiently required BiPap, then able to wean to nasal cannula. Found to have AKI and GDMT initially held then restarted per home regimen. Started on prednisone taper. He was discharged home, weight 155 lbs.  Admitted 3/24 with COPD exacerbation. Required BiPap and steroids. Started on IV Solu-Medrol. UDS + cocaine. Cleda Daub and Sherryll Burger stopped with AKI.    Had PYP scan 04/09/23 --> Stongly suggestive of TTR. He was started on tafamidis but developed severe rash/dermatitis after starting that improved when he stopped and came back when he restarted. He is now off tafamidis.   Echo 10/24 showed EF 40% with moderate LVH, global hypokinesis, mild RV enlargement with mildly decreased systolic function, IVC normal.   Seen in UC s/p fall 09/14/23. Underwent R distal thumb reduction.  Today he returns for HF follow up. Overall feeling fine. He is not SOB walking on flat ground or with ADLs. Denies palpitations, CP, dizziness, edema, or PND/Orthopnea. Appetite ok. No fever or chills. Weight at home 140 pounds. Taking all medications. Not wearing BiPap, does not have mask. Smoking 5-6 cigarettes/day, no ETOH or drugs.   Labs (3/24): K 4.1, creatinine 1.74 Labs (03/29/23): K 4.1 Creatinine 1.56.  Labs (7/24): urine immunofixation negative, myeloma panel negative Labs (9/24): BNP 553, K 3.5, creatinine 1.75 Labs (10/24): K 4.1, creatinine 1.62  ECG (personally reviewed): none ordered today  PMH: 1. OSA: Severe. CPAP. 2. COPD 3. HTN 4. 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  - R/LHC (5/23): nonobstructive CAD; RA mean 12, PA 54/29 (37), PCWP mean 23, LVEDP 22 mmHg, Fick  CO/CI 3.84/2.12. - Echo (5/23): EF 35%, RV okay, RVSP 54 mmHg. - Echo (8/23): EF 40-45% w/ LVH - Echo (10/24): EF 40% with moderate LVH, global hypokinesis, mild RV enlargement with mildly decreased systolic function, IVC normal.  - PYP scan (6/24): Grade 2, H/CL 1.3.  Looks abnormal  visually.   - Invitae gene testing negative for TTR mutations.  5. Hyperlipidemia 6. Bradycardia: Complete HB noted while sleeping, thought to be due to severe OSA.  7. CAD: LHC (5/23) with 50% OM1 stenosis.  8. Syncope: ?vasovagal 9. Type 2 diabetes.    Current Outpatient Medications  Medication Sig Dispense Refill   aspirin EC 81 MG tablet Take 1 tablet (81 mg total) by mouth daily. Swallow whole. 90 tablet 3   atorvastatin (LIPITOR) 80 MG tablet TAKE 1 Tablet BY MOUTH ONCE DAILY EVERY EVENING 90 tablet 3   bisoprolol (ZEBETA) 5 MG tablet Take 1 tablet (5 mg total) by mouth daily. 90 tablet 3   dapagliflozin propanediol (FARXIGA) 10 MG TABS tablet Take 1 tablet (10 mg total) by mouth daily. 15 tablet 0   furosemide (LASIX) 40 MG tablet TAKE 1 Tablet BY MOUTH TWICE DAILY (morning AND evening) 180 tablet 3   ipratropium-albuterol (DUONEB) 0.5-2.5 (3) MG/3ML SOLN Inhale 3 mLs into the lungs in the morning, at noon, in the evening, and at bedtime. 360 mL 11   isosorbide-hydrALAZINE (BIDIL) 20-37.5 MG tablet Take 0.5 tablets by mouth 3 (three) times daily. 45 tablet 3   metFORMIN (GLUCOPHAGE) 500 MG tablet Take 1 tablet (500 mg total) by mouth 2 (two) times daily with a meal. 60 tablet 0   montelukast (SINGULAIR) 10 MG tablet TAKE 1 Tablet BY MOUTH ONCE DAILY at bedtime (evening) 30 tablet 11   sacubitril-valsartan (ENTRESTO) 24-26 MG Take 1 tablet by mouth 2 (two) times daily. 60 tablet 11   spironolactone (ALDACTONE) 25 MG tablet Take 0.5 tablets (12.5 mg total) by mouth daily. 45 tablet 3   TRELEGY ELLIPTA 100-62.5-25 MCG/ACT AEPB Take 1 puff by mouth daily.     UNABLE TO FIND Med Name: Attruby 712 mg (2 capsules) by mouth twice daily     albuterol (VENTOLIN HFA) 108 (90 Base) MCG/ACT inhaler Inhale 2 puffs into the lungs every 4 (four) hours as needed for wheezing or shortness of breath. 1 each 2   No current facility-administered medications for this encounter.   Allergies  Allergen  Reactions   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    Allergic dermatitis/rash    Social History   Socioeconomic History   Marital status: Single    Spouse name: Not on file   Number of children: 1   Years of education: Not on file   Highest education level: 10th grade  Occupational History   Occupation: Disability  Tobacco Use   Smoking status: Some Days    Current packs/day: 0.00    Average packs/day: 0.5 packs/day for 50.0 years (25.0 ttl pk-yrs)    Types: Cigarettes    Start date: 03/19/1972    Last attempt to quit: 03/19/2022    Years since quitting: 1.5   Smokeless tobacco: Never   Tobacco comments:    Smoking cessation  Vaping Use   Vaping status: Never Used  Substance and Sexual Activity   Alcohol use: Not on file    Comment: socially   Drug use: Yes    Types: Marijuana    Comment: past   Sexual activity: Not on file  Other Topics Concern   Not  on file  Social History Narrative   He lives with his daughter apparently and 5 grandchildren.  He reports that he smokes probably less than a pack of cigarettes a day.  He occasionally drinks a beer or liquor but not daily.   Social Determinants of Health   Financial Resource Strain: Low Risk  (03/11/2022)   Overall Financial Resource Strain (CARDIA)    Difficulty of Paying Living Expenses: Not very hard  Food Insecurity: No Food Insecurity (01/13/2023)   Hunger Vital Sign    Worried About Running Out of Food in the Last Year: Never true    Ran Out of Food in the Last Year: Never true  Recent Concern: Food Insecurity - Food Insecurity Present (10/26/2022)   Hunger Vital Sign    Worried About Programme researcher, broadcasting/film/video in the Last Year: Often true    Ran Out of Food in the Last Year: Often true  Transportation Needs: Unmet Transportation Needs (08/24/2023)   PRAPARE - Administrator, Civil Service (Medical): Yes    Lack of Transportation (Non-Medical): Yes  Physical Activity: Not on file  Stress: Not on file  Social  Connections: Not on file  Intimate Partner Violence: Not At Risk (01/13/2023)   Humiliation, Afraid, Rape, and Kick questionnaire    Fear of Current or Ex-Partner: No    Emotionally Abused: No    Physically Abused: No    Sexually Abused: No   Family History  Problem Relation Age of Onset   Hypertension Mother    BP (!) 148/90   Pulse 70   Wt 66.5 kg (146 lb 9.6 oz)   SpO2 97%   BMI 24.40 kg/m   Wt Readings from Last 3 Encounters:  09/24/23 66.5 kg (146 lb 9.6 oz)  08/26/23 64 kg (141 lb 3.2 oz)  08/13/23 67.6 kg (149 lb)   PHYSICAL EXAM: General:  NAD. No resp difficulty, walked into clinic HEENT: Normal Neck: Supple. No JVD. Carotids 2+ bilat; no bruits. No lymphadenopathy or thryomegaly appreciated. Cor: PMI nondisplaced. Regular rate & rhythm. No rubs, gallops or murmurs. Lungs: Clear, diminished in bases Abdomen: Soft, nontender, nondistended. No hepatosplenomegaly. No bruits or masses. Good bowel sounds. Extremities: No cyanosis, clubbing, rash, edema Neuro: Alert & oriented x 3, cranial nerves grossly intact. Moves all 4 extremities w/o difficulty. Affect pleasant.  ASSESSMENT & PLAN:  1.  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 Echo (5/23) with LVEF 35%, RV okay.  [facility] 5/23 showed nonobstructive CAD; RA mean 12, PA 54/29 (37), PCWP mean 23, LVEDP 22 mmHg. Echo in 8/23 with EF 40-45%.  Echo in 10/24 showed EF 40% with moderate LVH, global hypokinesis, mild RV enlargement with mildly decreased systolic function, IVC normal. PYP scan concerning for TTR cardiac amyloidosis.  Myeloma studies were negative.  Invitae gene testing negative for TTR gene mutations.  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  He did not tolerate tafamidis due to severe rash. NYHA class II, not particularly symptomatic currently and not volume overloaded on exam.  - Would arrange for cardiac MRI to try to confirm TTR cardiac amyloidosis diagnosis, this  has been scheduled for 10/18/23.  The PYP scan was not read as floridly positive, but visually appears positive.  - He did not tolerate tafamidis and is not a candidate for vutrisiran at this time as he does not have hereditary TTR cardiac amyloidosis.  We will work on getting acoramidis for him when this becomes available.  He has the free trial, PA approved, pharmacy working on medication - Increase spironolactone to 25 mg daily. BMET/BNP today, repeat BMET in 1 week - Continue Entresto 24/26 mg bid.  - Continue Lasix 40 mg bid - Continue Farxiga 10 mg daily. No GU symptoms. - Continue bisoprolol 5 mg daily, low threshold to cut back with history of bradycardia, but this seems improved by Bipap. - Continue BiDil 0.5 tab tid. 2. Syncope:  Multiple recent episodes in the past. Evaluated in the ED 12/23. Event felt to by vasovagal.  2 week Zio (6/23) showed mostly NSR, significant bradycardia during sleeping hours w/ intermittent CHB thought to be due to severe OSA. No recent recurrence.  - Low threshold to cut back on bisoprolol.  - Use Bipap.  3. HTN: BP mildly elevated. - Increase spiro as above 4. OSA: Severe by sleep study.   - Needs to use Bipap.  5. CAD: Nonobstructive on [facility] 5/23.  No chest pain.  - Continue aspirin and statin. 6. COPD: Still smoking. Has history of COPD exacerbations though minimally symptomatic at this time.  - Discussed smoking cessation.  - On Trelegy. Followed by Pulmonary 7. SDOH: He has housing insecurity. Engage HFSW to help with resources.  Continue paramedicine.  Follow up in 3 months with Dr. Park Liter, FNP-BC 09/24/23

## 2023-09-23 ENCOUNTER — Other Ambulatory Visit (HOSPITAL_COMMUNITY): Payer: Self-pay

## 2023-09-23 ENCOUNTER — Telehealth (HOSPITAL_COMMUNITY): Payer: Self-pay

## 2023-09-23 ENCOUNTER — Telehealth (HOSPITAL_COMMUNITY): Payer: Self-pay | Admitting: Emergency Medicine

## 2023-09-23 NOTE — Telephone Encounter (Signed)
Advanced Heart Failure Patient Advocate Encounter  Prior Authorization for Marcus Tran has been approved.  Of note, previous PA (key: ) was denied as the product had not become available for order yet. PA was resubmitted on CMM under key [id number] and was approved.  PA#  [id number] Effective dates: 09/23/23 through 10/18/2024  Patients co-pay: unable to run test claim at this time. Marcus Tran has not been added to the EMR system yet.   Karle Plumber, PharmD, BCPS, BCCP, CPP Heart Failure Clinic Pharmacist [phone]

## 2023-09-23 NOTE — Telephone Encounter (Signed)
Marcus Tran returned my call from yesterday to confirm that he was aware of his ride information for his clinic appointment tomorrow.  During this conversation he expressed concern that he is going to be put out of his house that he is renting.  I understood him to say the house was being foreclosed on.  Passing this information to Rosetta Posner to assist him further.    Beatrix Shipper, EMT-Paramedic [phone] 09/23/2023

## 2023-09-23 NOTE — Telephone Encounter (Signed)
Called to confirm/remind patient of their appointment at the Advanced Heart Failure Clinic on 09/24/23.   Patient reminded to bring all medications and/or complete list.  Confirmed patient has transportation. Gave directions, instructed to utilize valet parking.  Confirmed appointment prior to ending call.

## 2023-09-24 ENCOUNTER — Other Ambulatory Visit (HOSPITAL_COMMUNITY): Payer: Self-pay | Admitting: Emergency Medicine

## 2023-09-24 ENCOUNTER — Telehealth (HOSPITAL_COMMUNITY): Payer: Self-pay | Admitting: Cardiology

## 2023-09-24 ENCOUNTER — Telehealth: Payer: Self-pay

## 2023-09-24 ENCOUNTER — Ambulatory Visit (HOSPITAL_COMMUNITY)
Admission: RE | Admit: 2023-09-24 | Discharge: 2023-09-24 | Disposition: A | Discharge status: Home / Self Care | Payer: 59 | Source: Ambulatory Visit | Attending: Family Medicine

## 2023-09-24 ENCOUNTER — Encounter (HOSPITAL_COMMUNITY): Payer: Self-pay

## 2023-09-24 VITALS — BP 148/90 | HR 70 | Wt 146.6 lb

## 2023-09-24 DIAGNOSIS — I2510 Atherosclerotic heart disease of native coronary artery without angina pectoris: Secondary | ICD-10-CM | POA: Diagnosis not present

## 2023-09-24 DIAGNOSIS — I428 Other cardiomyopathies: Secondary | ICD-10-CM | POA: Diagnosis not present

## 2023-09-24 DIAGNOSIS — J449 Chronic obstructive pulmonary disease, unspecified: Secondary | ICD-10-CM | POA: Diagnosis present

## 2023-09-24 DIAGNOSIS — Z7984 Long term (current) use of oral hypoglycemic drugs: Secondary | ICD-10-CM | POA: Diagnosis not present

## 2023-09-24 DIAGNOSIS — Z59811 Housing instability, housed, with risk of homelessness: Secondary | ICD-10-CM | POA: Insufficient documentation

## 2023-09-24 DIAGNOSIS — Z79899 Other long term (current) drug therapy: Secondary | ICD-10-CM | POA: Diagnosis not present

## 2023-09-24 DIAGNOSIS — I110 Hypertensive heart disease with heart failure: Secondary | ICD-10-CM | POA: Diagnosis present

## 2023-09-24 DIAGNOSIS — I10 Essential (primary) hypertension: Secondary | ICD-10-CM

## 2023-09-24 DIAGNOSIS — I5022 Chronic systolic (congestive) heart failure: Secondary | ICD-10-CM | POA: Diagnosis present

## 2023-09-24 DIAGNOSIS — I5020 Unspecified systolic (congestive) heart failure: Secondary | ICD-10-CM

## 2023-09-24 DIAGNOSIS — G4733 Obstructive sleep apnea (adult) (pediatric): Secondary | ICD-10-CM | POA: Insufficient documentation

## 2023-09-24 DIAGNOSIS — R55 Syncope and collapse: Secondary | ICD-10-CM | POA: Insufficient documentation

## 2023-09-24 DIAGNOSIS — Z720 Tobacco use: Secondary | ICD-10-CM

## 2023-09-24 DIAGNOSIS — F17210 Nicotine dependence, cigarettes, uncomplicated: Secondary | ICD-10-CM | POA: Insufficient documentation

## 2023-09-24 DIAGNOSIS — Z139 Encounter for screening, unspecified: Secondary | ICD-10-CM

## 2023-09-24 DIAGNOSIS — Z0000 Encounter for general adult medical examination without abnormal findings: Secondary | ICD-10-CM

## 2023-09-24 LAB — BASIC METABOLIC PANEL
Anion gap: 11 (ref 5–15)
BUN: 19 mg/dL (ref 8–23)
CO2: 28 mmol/L (ref 22–32)
Calcium: 9.4 mg/dL (ref 8.9–10.3)
Chloride: 98 mmol/L (ref 98–111)
Creatinine, Ser: 1.55 mg/dL — ABNORMAL HIGH (ref 0.61–1.24)
GFR, Estimated: 47 mL/min — ABNORMAL LOW (ref >60)
Glucose, Bld: 105 mg/dL — ABNORMAL HIGH (ref 70–99)
Potassium: 3.7 mmol/L (ref 3.5–5.1)
Sodium: 137 mmol/L (ref 135–145)

## 2023-09-24 LAB — BRAIN NATRIURETIC PEPTIDE: B Natriuretic Peptide: 105.4 pg/mL — ABNORMAL HIGH (ref 0.0–100.0)

## 2023-09-24 MED ORDER — DAPAGLIFLOZIN PROPANEDIOL 10 MG PO TABS: 10.0000 mg | ORAL_TABLET | Freq: Every day | ORAL | 0 refills | Status: DC

## 2023-09-24 MED ORDER — DAPAGLIFLOZIN PROPANEDIOL 10 MG PO TABS: 10.0000 mg | ORAL_TABLET | Freq: Every day | ORAL | 11 refills | Status: DC

## 2023-09-24 MED ORDER — SPIRONOLACTONE 25 MG PO TABS: 25.0000 mg | ORAL_TABLET | Freq: Every day | ORAL | 3 refills | Status: DC

## 2023-09-24 NOTE — Progress Notes (Unsigned)
Paramedicine Encounter   Patient ID: Marcus Tran , male,   DOB: Nov 03, 1950,72 y.o.,  MRN: [id number]   Met patient in clinic today with provider.   [facility]- B/P- 140/90 P- 70 SP02- 97%   Med changes today (if any) : increased Spironolacton to 25mg  daily.  Will make possible change to Furosemide dose after lab work.  Pt's pharamacy is waiting on final med changes to complete bubble packs.   Beatrix Shipper, EMT-Paramedic [phone] 09/29/2023       Increase spiro 25mg . Daily May be decreasing Furosemide based on todays labs  Discussed housing concerns w/ Eileen Stanford

## 2023-09-24 NOTE — Addendum Note (Signed)
Encounter addended by: Orlene Och, [place] on: 09/24/2023 10:18 AM  Actions taken: Order Reconciliation Section accessed, Order list changed, Home Medications modified

## 2023-09-24 NOTE — Telephone Encounter (Signed)
-----   Message from CMA Dede S sent at 09/24/2023  1:02 PM EST ----- Regarding: Marcus Tran During Mr. Orvan July visit today Shanda Bumps confirmed that she would write script for his Marcus Tran instead of Dr. Antoine Poche.  Pharmacy says they on received only a 15 day supply.  Could he get a 30 day supply please?  Thanks so much    Beatrix Shipper, EMT-Paramedic [phone] 09/24/2023

## 2023-09-24 NOTE — Telephone Encounter (Signed)
Received call from [facility] that patient's scheduled ride home left without picking patient up. Called [facility] and spoke with Parks. Patient's ride home was cancelled due to a no show. Informed representative that patient was waiting for ride but was never picked up. Arranged transportation home for patient again with representative. Was informed that Benedetto Goad was picking patient up at 11:24am in a [employer]. Confirmed with representative that patient is to be picked up at Entrance C. Called patient to inform him of the details of his ride home and waited for patient to be picked up. At this time no car of that description came to the entrance to pick up patient. Informed Rosetta Posner of the issue with transportation. Eileen Stanford stated that she will arrange transportation for this patient to get home. Informed patient that Eileen Stanford will be coming to assist him with getting home. Eileen Stanford was made aware that patient is standing by the fronts doors of entrance C.    Renaee Munda, MS, ERHD, [facility]  Care Guide, Health & Wellness Coach [address] [place] [address] Telephone: [phone] Email: [email or web address]

## 2023-09-24 NOTE — Patient Instructions (Addendum)
Medication Changes:  INCREASE SPIRONOLACTONE 25 MG BY MOUTH DAILY    Lab Work:  Labs done today, your results will be available in MyChart, we will contact you for abnormal readings.     Follow-Up in: 3 MONTHS WITH DR. Shirlee Latch. GIVE USE A CALL IN END OF JANUARY TO SCHEDULE  APPOINTMENT.  You will receive a reminder letter in the mail a few months in advance. If you don't receive a letter, please call our office to schedule the follow-up appointment..  At the Advanced Heart Failure Clinic, you and your health needs are our priority. We have a designated team specialized in the treatment of Heart Failure. This Care Team includes your primary Heart Failure Specialized Cardiologist (physician), Advanced Practice Providers (APPs- Physician Assistants and Nurse Practitioners), and Pharmacist who all work together to provide you with the care you need, when you need it.   You may see any of the following providers on your designated Care Team at your next follow up:  Dr. Arvilla Meres Dr. Marca Ancona Dr. Dorthula Nettles Dr. Theresia Bough Tonye Becket, NP Robbie Lis, [place] [facility] [place], [place] Brynda Peon, NP [place] Lee, NP Karle Plumber, PharmD   Please be sure to bring in all your medications bottles to every appointment.   Need to Contact us:  If you have any questions or concerns before your next appointment please send [place] a message through [place] or call our office at [phone].    TO LEAVE A MESSAGE FOR THE NURSE SELECT OPTION 2, PLEASE LEAVE A MESSAGE INCLUDING: YOUR NAME DATE OF BIRTH CALL BACK NUMBER REASON FOR CALL**this is important as we prioritize the call backs  YOU WILL RECEIVE A CALL BACK THE SAME DAY AS LONG AS YOU CALL BEFORE 4:00 PM

## 2023-09-24 NOTE — Addendum Note (Signed)
Encounter addended by: Demetrius Charity, RN on: 09/24/2023 10:22 AM  Actions taken: Pharmacy for encounter modified, Order list changed, Clinical Note Signed, Flowsheet accepted, Charge Capture section accepted

## 2023-09-24 NOTE — Progress Notes (Signed)
H&V Care Navigation CSW Progress Note  Clinical Social Worker met with patient to discuss current housing concerns.  Reports that he received notice from apartment that the building was being foreclosed upon because the owner wasn't paying property taxes- he struggles with reading but states that his daughter was talking about how they were going to be homeless so he is under the impression that they are being told to leave.  Paramedic will get picture of letter next time she is out so I can read specifics.  Daughter also living there with her 5 children- patient interested in separating from them at some point due to some dynamics with one of the grandsons who is aggressive.  Sounds as if patient is paying the $750/month in rent and daughter is contributing to utilities sometimes but hasn't had stable employment.  CSW explained limitations in finding affordable housing at this time.  Provided list of [facility] Housing for seniors for patient to start pursuing on his own to eventually move somewhere on his own.  Also provided list of housing options under $1,200/rent with hopes him and his daughter could combine resources.   SDOH Screenings   Food Insecurity: No Food Insecurity (01/13/2023)  Recent Concern: Food Insecurity - Food Insecurity Present (10/26/2022)  Housing: Medium Risk (09/24/2023)  Transportation Needs: Unmet Transportation Needs (08/24/2023)  Utilities: Not At Risk (01/13/2023)  Alcohol Screen: Low Risk  (05/06/2022)  Financial Resource Strain: Low Risk  (03/11/2022)  Tobacco Use: High Risk (09/24/2023)   Will continue to follow and assist as needed  Burna Sis, LCSW Clinical Social Worker Advanced Heart Failure Clinic Desk#: [phone] Cell#: [phone]

## 2023-09-24 NOTE — Telephone Encounter (Signed)
Script sent  

## 2023-09-24 NOTE — Addendum Note (Signed)
Encounter addended by: Burna Sis, LCSW on: 09/24/2023 11:42 AM  Actions taken: Flowsheet accepted

## 2023-09-24 NOTE — Addendum Note (Signed)
Encounter addended by: Burna Sis, LCSW on: 09/24/2023 11:02 AM  Actions taken: Flowsheet accepted, Clinical Note Signed

## 2023-09-28 ENCOUNTER — Other Ambulatory Visit (HOSPITAL_COMMUNITY): Payer: Self-pay | Admitting: Emergency Medicine

## 2023-09-28 NOTE — Progress Notes (Unsigned)
Paramedicine Encounter    Patient ID: Marcus Tran, male    DOB: 1950-11-17, 72 y.o.   MRN: [id number]   Complaints***  Assessment***  Compliance with meds yes  Pill box filled n/a  Bubble packs  Refills needed none  Meds changes since last visit Attruby to be arriving by mail soon to replace Vydamax    Social changes None   There were no vitals taken for this visit. Weight yesterday- not taken Last visit weight-141lb  ACTION: {Paramed Action:[id number]}  Beatrix Shipper, EMT-Paramedic [phone] 09/28/23  Patient Care Team: Hillery Aldo, NP as PCP - General Rollene Rotunda, MD as PCP - Cardiology (Cardiology)  Patient Active Problem List   Diagnosis Date Noted  . Cocaine use 01/15/2023  . Noncompliance 01/13/2023  . Adenovirus pneumonia 11/12/2022  . Chronic HFrEF (heart failure with reduced ejection fraction) (HCC) 08/13/2022  . DM2 (diabetes mellitus, type 2) (HCC) 08/13/2022  . Obstructive sleep apnea (adult) (pediatric) 08/08/2022  . Acute respiratory failure with hypoxia and hypercapnia (HCC) 05/04/2022  . Third degree heart block (HCC) 04/15/2022  . Snoring 04/15/2022  . Nonischemic cardiomyopathy (HCC) 04/15/2022  . HFrEF (heart failure with reduced ejection fraction) (HCC) 04/15/2022  . COPD with acute exacerbation (HCC) 03/22/2022  . Chronic combined systolic and diastolic CHF (congestive heart failure) (HCC)   . Tobacco use disorder 03/08/2022  . 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 HTN (hypertension) 03/08/2022  . Stage 3a chronic kidney disease (CKD) (HCC) 03/08/2022  . Coronary artery disease 03/08/2022    Current Outpatient Medications:  .  albuterol (VENTOLIN HFA) 108 (90 Base) MCG/ACT inhaler, Inhale 2 puffs into the lungs every 4 (four) hours as needed for wheezing or shortness of breath., Disp: 1 each, Rfl: 2 .  aspirin EC 81 MG tablet, Take 1 tablet (81 mg total) by mouth daily. Swallow whole., Disp: 90 tablet, Rfl: 3 .   atorvastatin (LIPITOR) 80 MG tablet, TAKE 1 Tablet BY MOUTH ONCE DAILY EVERY EVENING, Disp: 90 tablet, Rfl: 3 .  bisoprolol (ZEBETA) 5 MG tablet, Take 1 tablet (5 mg total) by mouth daily., Disp: 90 tablet, Rfl: 3 .  dapagliflozin propanediol (FARXIGA) 10 MG TABS tablet, Take 1 tablet (10 mg total) by mouth daily., Disp: 30 tablet, Rfl: 11 .  furosemide (LASIX) 40 MG tablet, TAKE 1 Tablet BY MOUTH TWICE DAILY (morning AND evening), Disp: 180 tablet, Rfl: 3 .  ipratropium-albuterol (DUONEB) 0.5-2.5 (3) MG/3ML SOLN, Inhale 3 mLs into the lungs in the morning, at noon, in the evening, and at bedtime., Disp: 360 mL, Rfl: 11 .  isosorbide-hydrALAZINE (BIDIL) 20-37.5 MG tablet, Take 0.5 tablets by mouth 3 (three) times daily., Disp: 45 tablet, Rfl: 3 .  metFORMIN (GLUCOPHAGE) 500 MG tablet, Take 1 tablet (500 mg total) by mouth 2 (two) times daily with a meal., Disp: 60 tablet, Rfl: 0 .  montelukast (SINGULAIR) 10 MG tablet, TAKE 1 Tablet BY MOUTH ONCE DAILY at bedtime (evening), Disp: 30 tablet, Rfl: 11 .  sacubitril-valsartan (ENTRESTO) 24-26 MG, Take 1 tablet by mouth 2 (two) times daily., Disp: 60 tablet, Rfl: 11 .  spironolactone (ALDACTONE) 25 MG tablet, Take 25 mg by mouth daily. (Patient not taking: Reported on 09/24/2023), Disp: , Rfl:  .  spironolactone (ALDACTONE) 25 MG tablet, Take 1 tablet (25 mg total) by mouth daily., Disp: 90 tablet, Rfl: 3 .  TRELEGY ELLIPTA 100-62.5-25 MCG/ACT AEPB, Take 1 puff by mouth daily., Disp: , Rfl:  .  UNABLE TO FIND, Med Name: [name]  712 mg (2 capsules) by mouth twice daily (Patient not taking: Reported on 09/24/2023), Disp: , Rfl:  Allergies  Allergen Reactions  . 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 Marital status: Single    Spouse name: Not on file  . Number of children: 1  . Years of education: Not on file  . Highest education level: 10th grade  Occupational History  .  Occupation: Disability  Tobacco Use  . Smoking status: Some Days    Current packs/day: 0.00    Average packs/day: 0.5 packs/day for 50.0 years (25.0 ttl pk-yrs)    Types: Cigarettes    Start date: 03/19/1972    Last attempt to quit: 03/19/2022    Years since quitting: 1.5  . Smokeless tobacco: Never  . Tobacco comments:    Smoking cessation  Vaping Use  . Vaping status: Never Used  Substance and Sexual Activity  . Alcohol use: Not on file    Comment: socially  . Drug use: Yes    Types: Marijuana    Comment: past  . Sexual activity: Not on file  Other Topics Concern  . Not on file  Social History Narrative   He lives with his daughter apparently and 5 grandchildren.  He reports that he smokes probably less than a pack of cigarettes a day.  He occasionally drinks a beer or liquor but not daily.   Social Determinants of Health   Financial Resource Strain: Low Risk  (03/11/2022)   Overall Financial Resource Strain (CARDIA)   . Difficulty of Paying Living Expenses: Not very hard  Food Insecurity: No Food Insecurity (01/13/2023)   Hunger Vital Sign   . Worried About Programme researcher, broadcasting/film/video in the Last Year: Never true   . Ran Out of Food in the Last Year: Never true  Recent Concern: Food Insecurity - Food Insecurity Present (10/26/2022)   Hunger Vital Sign   . Worried About Programme researcher, broadcasting/film/video in the Last Year: Often true   . Ran Out of Food in the Last Year: Often true  Transportation Needs: Unmet Transportation Needs (09/24/2023)   PRAPARE - Transportation   . Lack of Transportation (Medical): Yes   . Lack of Transportation (Non-Medical): Yes  Physical Activity: Not on file  Stress: Not on file  Social Connections: Not on file  Intimate Partner Violence: Not At Risk (01/13/2023)   Humiliation, Afraid, Rape, and Kick questionnaire   . Fear of Current or Ex-Partner: No   . Emotionally Abused: No   . Physically Abused: No   . Sexually Abused: No    Physical Exam      Future  Appointments  Date Time Provider Department Center  10/01/2023 12:00 PM [facility] None  10/18/2023  8:00 AM [facility] 1 [facility]

## 2023-09-30 ENCOUNTER — Other Ambulatory Visit (HOSPITAL_COMMUNITY): Payer: Self-pay

## 2023-10-01 ENCOUNTER — Ambulatory Visit (HOSPITAL_COMMUNITY)
Admission: RE | Admit: 2023-10-01 | Discharge: 2023-10-01 | Disposition: A | Discharge status: Home / Self Care | Payer: 59 | Source: Ambulatory Visit | Attending: Cardiology | Admitting: Cardiology

## 2023-10-01 DIAGNOSIS — I5022 Chronic systolic (congestive) heart failure: Secondary | ICD-10-CM | POA: Diagnosis present

## 2023-10-01 LAB — BASIC METABOLIC PANEL
Anion gap: 13 (ref 5–15)
BUN: 32 mg/dL — ABNORMAL HIGH (ref 8–23)
CO2: 24 mmol/L (ref 22–32)
Calcium: 9.3 mg/dL (ref 8.9–10.3)
Chloride: 98 mmol/L (ref 98–111)
Creatinine, Ser: 1.79 mg/dL — ABNORMAL HIGH (ref 0.61–1.24)
GFR, Estimated: 40 mL/min — ABNORMAL LOW (ref >60)
Glucose, Bld: 96 mg/dL (ref 70–99)
Potassium: 3.7 mmol/L (ref 3.5–5.1)
Sodium: 135 mmol/L (ref 135–145)

## 2023-10-05 ENCOUNTER — Encounter (HOSPITAL_COMMUNITY): Payer: Self-pay

## 2023-10-05 ENCOUNTER — Telehealth (HOSPITAL_COMMUNITY): Payer: Self-pay | Admitting: Emergency Medicine

## 2023-10-05 NOTE — Telephone Encounter (Signed)
Called to confirm appointment for 12/18  @ 1:00.  LVM.  Advised pt he has some med changes from [facility] and I will assist him with this during our home visit.    Beatrix Shipper, EMT-Paramedic [phone] 10/05/2023

## 2023-10-06 ENCOUNTER — Other Ambulatory Visit (HOSPITAL_COMMUNITY): Payer: Self-pay | Admitting: Emergency Medicine

## 2023-10-06 NOTE — Progress Notes (Signed)
Paramedicine Encounter    Patient ID: Marcus Tran, male    DOB: Jan 22, 1951, 72 y.o.   MRN: [id number]   Complaints NONE  Assessment A&O x 4, skin W&D w/ good color. Lung sounds clear throughout.  No peripheral edema noted.  Compliance with meds YES  Pill box filled n/a  uses bubble packs  Refills needed NONE  Meds changes since last visit Attruby added to replace Vyndamax.  First dose to begin 12/19  Dosing= 356mg . Per tablet  Take 2 tablets in the a.m. and 2 tablets in p.m.   Also, Furosemide dose changed from 40mg . BID to 40mg  in a.m. and 20mg  in p.m.    Social changes NONE   BP 110/80 (BP Location: Right Arm, Patient Position: Standing, Cuff Size: Normal)   Pulse 73   Wt 146 lb 12.8 oz (66.6 kg)   SpO2 100%   BMI 24.43 kg/m  Weight yesterday- not taken Last visit weight-142lb (09/28/23)  Had attempted home visit earlier today and pt was not home.   Pt presents A&O x 4, skin W&D w/ good color.  He denies chest pain or SOB.  No peripheral edema noted.  Pt. Has been compliant with all meds.   Pt. Received his Attruby in the mail.  He advised he has not taken it yet.  I reviewed med w/ him and noted he is to take 2 tablets in the morning and 2 tablets in the evening.  I marked the pill packs w/ dates.  He will begin his first dose 12/19  He advises he understands instructions.   Also, pt had med change in his Furosemide dose from 40mg . BID to 40mg  in a.m. and 20mg  in p.m.  I walked pt through removing the p.m. Furosemide tablet from his bubble pack and cutting tablet in half and instructed to only take 1/2 tab each p.m.  The Furosemide tablet looks totally different from any other pill in his evening dose and he demonstrated he could identify the med without incident. (Small round white tablet w/ 17 on it.)    Next visit 10/19/23 @ 11:00  ACTION: Home visit completed  Bethanie Dicker [phone] 10/06/23  Patient Care Team: Hillery Aldo, NP as PCP -  Reatha Armour, MD as PCP - Cardiology (Cardiology)  Patient Active Problem List   Diagnosis Date Noted   Cocaine use 01/15/2023   Noncompliance 01/13/2023   Adenovirus pneumonia 11/12/2022   Chronic HFrEF (heart failure with reduced ejection fraction) (HCC) 08/13/2022   DM2 (diabetes mellitus, type 2) (HCC) 08/13/2022   Obstructive sleep apnea (adult) (pediatric) 08/08/2022   Acute respiratory failure with hypoxia and hypercapnia (HCC) 05/04/2022   Third degree heart block (HCC) 04/15/2022   Snoring 04/15/2022   Nonischemic cardiomyopathy (HCC) 04/15/2022   HFrEF (heart failure with reduced ejection fraction) (HCC) 04/15/2022   COPD with acute exacerbation (HCC) 03/22/2022   Chronic combined systolic and diastolic CHF (congestive heart failure) (HCC)    Tobacco use disorder 03/08/2022   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 Swallow whole.,  Disp: 90 tablet, Rfl: 3   atorvastatin (LIPITOR) 80 MG tablet, TAKE 1 Tablet BY MOUTH ONCE DAILY EVERY EVENING, Disp: 90 tablet, Rfl: 3   bisoprolol (ZEBETA) 5 MG tablet, Take 1 tablet (5 mg total) by mouth daily., Disp: 90 tablet, Rfl: 3   dapagliflozin propanediol (FARXIGA) 10 MG TABS tablet, Take 1 tablet (10 mg total) by mouth daily., Disp: 30 tablet, Rfl: 11   furosemide (LASIX) 40 MG tablet, TAKE 1 Tablet BY MOUTH TWICE DAILY (morning AND evening), Disp: 180 tablet, Rfl: 3   ipratropium-albuterol (DUONEB) 0.5-2.5 (3) MG/3ML SOLN, Inhale 3 mLs into the lungs in the morning, at noon, in the evening, and at bedtime., Disp:  360 mL, Rfl: 11   isosorbide-hydrALAZINE (BIDIL) 20-37.5 MG tablet, Take 0.5 tablets by mouth 3 (three) times daily., Disp: 45 tablet, Rfl: 3   metFORMIN (GLUCOPHAGE) 500 MG tablet, Take 1 tablet (500 mg total) by mouth 2 (two) times daily with a meal., Disp: 60 tablet, Rfl: 0   montelukast (SINGULAIR) 10 MG tablet, TAKE 1 Tablet BY MOUTH ONCE DAILY at bedtime (evening), Disp: 30 tablet, Rfl: 11   sacubitril-valsartan (ENTRESTO) 24-26 MG, Take 1 tablet by mouth 2 (two) times daily., Disp: 60 tablet, Rfl: 11   spironolactone (ALDACTONE) 25 MG tablet, Take 25 mg by mouth daily. 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    Allergic dermatitis/rash     Social History   Socioeconomic History   Marital status: Single    Spouse name: Not on file   Number of children: 1   Years of education: Not on file   Highest education level: 10th grade  Occupational History   Occupation: Disability  Tobacco Use   Smoking status: Some Days    Current packs/day: 0.00    Average packs/day: 0.5 packs/day for 50.0 years (25.0 ttl pk-yrs)    Types: Cigarettes    Start date: 03/19/1972    Last attempt to quit: 03/19/2022    Years since quitting: 1.5   Smokeless tobacco: Never   Tobacco comments:    Smoking cessation  Vaping Use   Vaping status: Never Used  Substance and Sexual Activity   Alcohol use: Not on file    Comment: socially   Drug use: Yes    Types: Marijuana    Comment: past   Sexual activity: Not on file  Other Topics Concern   Not on file  Social History Narrative   He lives  with his daughter apparently and 5 grandchildren.  He reports that he smokes probably less than a pack of cigarettes a day.  He occasionally drinks a beer or liquor but not daily.   Social Drivers of Corporate investment banker Strain: Low Risk  (03/11/2022)   Overall Financial Resource Strain (CARDIA)    Difficulty of Paying Living Expenses: Not very hard  Food Insecurity: No Food Insecurity (01/13/2023)   Hunger Vital Sign    Worried About Running Out of Food in the Last Year: Never true    Ran Out of Food in  the Last Year: Never true  Recent Concern: Food Insecurity - Food Insecurity Present (10/26/2022)   Hunger Vital Sign    Worried About Running Out of Food in the Last Year: Often true    Ran Out of Food in the Last Year: Often true  Transportation Needs: Unmet Transportation Needs (09/24/2023)   PRAPARE - Administrator, Civil Service (Medical): Yes    Lack of Transportation (Non-Medical): Yes  Physical Activity: Not on file  Stress: Not on file  Social Connections: Not on file  Intimate Partner Violence: Not At Risk (01/13/2023)   Humiliation, Afraid, Rape, and Kick questionnaire    Fear of Current or Ex-Partner: No    Emotionally Abused: No    Physically Abused: No    Sexually Abused: No    Physical Exam      Future Appointments  Date Time Provider Department Center  10/18/2023  8:00 AM [facility] 1 [facility]

## 2023-10-14 ENCOUNTER — Telehealth: Payer: Self-pay | Admitting: Licensed Clinical Social Worker

## 2023-10-14 ENCOUNTER — Telehealth (HOSPITAL_COMMUNITY): Payer: Self-pay | Admitting: Emergency Medicine

## 2023-10-14 NOTE — Telephone Encounter (Signed)
LVM regarding upcoming MRI appt.  Octavio Graves is working on transport and I will call or go by and give him transport information later today.    Beatrix Shipper, EMT-Paramedic [phone] 10/14/2023

## 2023-10-14 NOTE — Telephone Encounter (Signed)
H&V Care Navigation CSW Progress Note  Clinical Social Worker received request from paramedic Marcus Tran to assist with ride for pt to upcoming imaging appt on 12/30.   Due to early arrival required and less than 3 business days prior to appt- a cab was scheduled for pt through [employer]. Sent completed vouchers to dispatch and called in request. Copies sent securely to Marcus Posner, LCSW at HF clinic. Marcus Tran aware and will provide update to pt to be ready at 7am for pick up on Monday. All details added to appt notes- return ride will need to be called when pt appt completed to [phone].   Waiver sent to Marcus Tran to review with pt. Signed waiver on file in media tab from 11/2022.  Patient is participating in a Managed Medicaid Plan:  No, UHC Medicare and Medicaid.   SDOH Screenings   Food Insecurity: No Food Insecurity (01/13/2023)  Recent Concern: Food Insecurity - Food Insecurity Present (10/26/2022)  Housing: Medium Risk (09/24/2023)  Transportation Needs: Unmet Transportation Needs (09/24/2023)  Utilities: Not At Risk (01/13/2023)  Alcohol Screen: Low Risk  (05/06/2022)  Financial Resource Strain: Low Risk  (03/11/2022)  Tobacco Use: High Risk (09/24/2023)    Marcus Tran, MSW, LCSW Clinical Social Worker II [facility] Heart/Vascular Care Navigation  [phone]- work cell phone (preferred) [phone]- desk phone  10/14/2023  Marcus Tran DOB: July 17, 1951 MRN: [id number]   RIDER WAIVER AND RELEASE OF LIABILITY  For the purposes of helping with transportation needs, [facility] partners with outside transportation providers (taxi companies, [place], Catering manager.) to give [name] patients or other approved people the choice of on-demand rides [name]") to our buildings for non-emergency visits.  By using [employer], I, the person signing this document, on behalf of myself and/or any legal minors (in my care using the [employer]), agree:  Science writer  given to me are supplied by independent, outside transportation providers who do not work for, or have any affiliation with, [name]. [facility] is not a transportation company. [facility] has no control over the quality or safety of the rides I get using [employer]. [facility] has no control over whether any outside ride will happen on time or not. [facility] gives no guarantee on the reliability, quality, safety, or availability on any rides, or that no mistakes will happen. I know and accept that traveling by vehicle (car, truck, [employer], Zenaida Niece, bus, taxi, etc.) has risks of serious injuries such as disability, being paralyzed, and death. I know and agree the risk of using [employer] is mine alone, and not [employer]. Transport Services are provided "as is" and as are available. The transportation providers are in charge for all inspections and care of the vehicles used to provide these rides. I agree not to take legal action against [facility], its agents, employees, officers, directors, representatives, insurers, attorneys, assigns, successors, subsidiaries, and affiliates at any time for any reasons related directly or indirectly to using [employer]. I also agree not to take legal action against [facility] or its affiliates for any injury, death, or damage to property caused by or related to using [employer]. I have read this Waiver and Release of Liability, and I understand the terms used in it and their legal meaning. This Waiver is freely and voluntarily given with the understanding that my right (or any legal minors) to legal action against  relating to [employer] is knowingly given up to use these  services.   I attest that I read the Ride Waiver and Release of Liability to Marcus Tran, gave [name] the opportunity to ask questions and answered the questions asked (if any). I affirm that Marcus Tran then provided consent for  assistance with transportation.     Marcus Tran

## 2023-10-15 ENCOUNTER — Telehealth (HOSPITAL_COMMUNITY): Payer: Self-pay | Admitting: *Deleted

## 2023-10-15 NOTE — Telephone Encounter (Signed)
Attempted to call patient regarding upcoming cardiac MRI appointment. Left message on voicemail with name and callback number  Larey Brick RN Navigator Cardiac Imaging [facility] [phone] Office [phone] Cell  Reminder to obtain labs prior to his appt.

## 2023-10-18 ENCOUNTER — Ambulatory Visit (HOSPITAL_COMMUNITY)
Admission: RE | Admit: 2023-10-18 | Discharge: 2023-10-18 | Disposition: A | Discharge status: Home / Self Care | Payer: 59 | Source: Ambulatory Visit | Attending: Cardiology | Admitting: Cardiology

## 2023-10-18 ENCOUNTER — Other Ambulatory Visit (HOSPITAL_COMMUNITY): Payer: Self-pay | Admitting: Cardiology

## 2023-10-18 DIAGNOSIS — I5042 Chronic combined systolic (congestive) and diastolic (congestive) heart failure: Secondary | ICD-10-CM

## 2023-10-18 MED ORDER — GADOBUTROL 1 MMOL/ML IV SOLN
9.0000 mL | Freq: Once | INTRAVENOUS | Status: AC | PRN
  Administered 2023-10-18: 9 mL via INTRAVENOUS

## 2023-10-22 ENCOUNTER — Other Ambulatory Visit (HOSPITAL_COMMUNITY): Payer: Self-pay | Admitting: Cardiology

## 2023-10-22 ENCOUNTER — Telehealth (HOSPITAL_COMMUNITY): Payer: Self-pay | Admitting: Cardiology

## 2023-10-22 DIAGNOSIS — I5020 Unspecified systolic (congestive) heart failure: Secondary | ICD-10-CM

## 2023-10-22 NOTE — Telephone Encounter (Signed)
-  refilled addressed and approved

## 2023-10-22 NOTE — Telephone Encounter (Signed)
-----   Message from CMA Dede S sent at 10/22/2023  1:27 PM EST ----- Regarding: refill Mr. Fredin needs refill on his Bidil  called into [facility] [phone] please.  They are working on his bubble packs!  Thanks so much,    Mary Sharps, EMT-Paramedic [phone] 10/22/2023

## 2023-10-26 ENCOUNTER — Encounter (HOSPITAL_COMMUNITY): Payer: Self-pay

## 2023-10-26 ENCOUNTER — Other Ambulatory Visit (HOSPITAL_COMMUNITY): Payer: Self-pay | Admitting: Pharmacy Technician

## 2023-10-26 ENCOUNTER — Other Ambulatory Visit (HOSPITAL_COMMUNITY): Payer: Self-pay | Admitting: Pharmacist

## 2023-10-26 ENCOUNTER — Other Ambulatory Visit: Payer: Self-pay

## 2023-10-26 ENCOUNTER — Other Ambulatory Visit (HOSPITAL_COMMUNITY): Payer: Self-pay

## 2023-10-26 ENCOUNTER — Other Ambulatory Visit (HOSPITAL_COMMUNITY): Payer: Self-pay | Admitting: Emergency Medicine

## 2023-10-26 MED ORDER — ATTRUBY 356 MG PO TBPK
712.0000 mg | ORAL_TABLET | Freq: Two times a day (BID) | ORAL | 11 refills | Status: DC
  Filled 2023-10-26: qty 112, 28d supply, fill #0
  Filled 2023-11-19: qty 112, 28d supply, fill #1
  Filled 2023-12-14: qty 112, 28d supply, fill #2
  Filled 2024-01-17 (×2): qty 112, 28d supply, fill #3
  Filled 2024-02-01: qty 112, 28d supply, fill #4
  Filled 2024-02-23: qty 112, 28d supply, fill #5
  Filled 2024-03-23: qty 112, 28d supply, fill #6
  Filled 2024-04-11 – 2024-04-13 (×2): qty 112, 28d supply, fill #7
  Filled 2024-05-05: qty 112, 28d supply, fill #8
  Filled 2024-06-08: qty 112, 28d supply, fill #9
  Filled 2024-07-05: qty 112, 28d supply, fill #10
  Filled 2024-07-25: qty 112, 28d supply, fill #11

## 2023-10-26 NOTE — Progress Notes (Signed)
 Specialty Pharmacy Initial Fill Coordination Note  Marcus Tran is a 73 y.o. male contacted today regarding initial fill of specialty medication(s) Acoramidis  HCl (Attruby )   Patient requested Delivery   Delivery date: 10/29/23   Verified address: [address], [place]  [address]   Medication will be filled on 10/28/23.   Patient is aware of $0 copayment.   [name] Pa, CPhT

## 2023-10-26 NOTE — Progress Notes (Signed)
 Specialty Pharmacy Initiation Note   Marcus Tran is a 73 y.o. male who will be followed by the specialty pharmacy service for RxSp Cardiology    Review of administration, indication, effectiveness, safety, potential side effects, storage/disposable, and missed dose instructions occurred today for patient's specialty medication(s) Acoramidis  HCl (Attruby )     Patient/Caregiver did not have any additional questions or concerns.   Patient's therapy is appropriate to: Continue Patient received 28 day free trial from manufacturer 10/07/23 and has done well with the medication. Will continue.    Goals Addressed             This Visit's Progress    Maintain optimal adherence to therapy       Patient is initiating therapy. Patient will maintain adherence         [name] [name] Specialty Pharmacist

## 2023-10-27 ENCOUNTER — Other Ambulatory Visit: Payer: Self-pay

## 2023-10-27 NOTE — Progress Notes (Signed)
 Paramedicine Encounter    Patient ID: Marcus Tran, male    DOB: August 07, 1951, 73 y.o.   MRN: [id number]   Complaints NONE  Assessment A&O x 4, skin W&D w/ good color.  Pt denies chest pain or SOB.  Lung sounds clear and equal bilat. No peripheral edema noted.    Compliance with meds YES  Pill box filled N/A  Refills needed NONE  Meds changes since last visit NONE    Social changes NONE   BP 100/70 (BP Location: Right Arm, Patient Position: Sitting, Cuff Size: Normal)   Pulse 72   Resp 14   Wt 140 lb 3.2 oz (63.6 kg)   SpO2 98%   BMI 23.33 kg/m  Weight yesterday-not taken  Last visit weight-146lb   ACTION: Home visit completed  [name] [phone] 10/27/23  Patient Care Team: [name], NP as PCP - Diedre Lavona Agent, MD as PCP - Cardiology (Cardiology)  Patient Active Problem List   Diagnosis Date Noted   Cocaine use 01/15/2023   Noncompliance 01/13/2023   Adenovirus pneumonia 11/12/2022   Chronic HFrEF (heart failure with reduced ejection fraction) (HCC) 08/13/2022   DM2 (diabetes mellitus, type 2) (HCC) 08/13/2022   Obstructive sleep apnea (adult) (pediatric) 08/08/2022   Acute respiratory failure with hypoxia and hypercapnia (HCC) 05/04/2022   Third degree heart block (HCC) 04/15/2022   Snoring 04/15/2022   Nonischemic cardiomyopathy (HCC) 04/15/2022   HFrEF (heart failure with reduced ejection fraction) (HCC) 04/15/2022   COPD with acute exacerbation (HCC) 03/22/2022   Chronic combined systolic and diastolic CHF (congestive heart failure) (HCC)    Tobacco use disorder 03/08/2022   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   HTN (hypertension) 03/08/2022   Stage 3a chronic kidney disease (CKD) (HCC) 03/08/2022   Coronary artery disease 03/08/2022    Current Outpatient Medications:    Acoramidis , 712MG  Twice Daily, (ATTRUBY ) 356 MG TBPK, Take 712 mg by mouth 2 (two) times daily., Disp: 112 each, Rfl: 11   albuterol   (VENTOLIN  HFA) 108 (90 Base) MCG/ACT inhaler, Inhale 2 puffs into the lungs every 4 (four) hours as needed for wheezing or shortness of breath., Disp: 1 each, Rfl: 2   aspirin  EC 81 MG tablet, Take 1 tablet (81 mg total) by mouth daily. Swallow whole., Disp: 90 tablet, Rfl: 3   atorvastatin  (LIPITOR ) 80 MG tablet, TAKE 1 Tablet BY MOUTH ONCE DAILY EVERY EVENING, Disp: 90 tablet, Rfl: 3   bisoprolol  (ZEBETA ) 5 MG tablet, Take 1 tablet (5 mg total) by mouth daily., Disp: 90 tablet, Rfl: 3   dapagliflozin  propanediol (FARXIGA ) 10 MG TABS tablet, Take 1 tablet (10 mg total) by mouth daily., Disp: 30 tablet, Rfl: 11   furosemide  (LASIX ) 40 MG tablet, TAKE 1 Tablet BY MOUTH TWICE DAILY (morning AND evening), Disp: 180 tablet, Rfl: 3   ipratropium-albuterol  (DUONEB) 0.5-2.5 (3) MG/3ML SOLN, Inhale 3 mLs into the lungs in the morning, at noon, in the evening, and at bedtime., Disp: 360 mL, Rfl: 11   isosorbide -hydrALAZINE  (BIDIL ) 20-37.5 MG tablet, TAKE half TABLET BY MOUTH THREE TIMES DAILY (MORNING, noon, evening), Disp: 45 tablet, Rfl: 3   metFORMIN  (GLUCOPHAGE ) 500 MG tablet, Take 1 tablet (500 mg total) by mouth 2 (two) times daily with a meal., Disp: 60 tablet, Rfl: 0   montelukast  (SINGULAIR ) 10 MG tablet, TAKE 1 Tablet BY MOUTH ONCE DAILY at bedtime (evening), Disp: 30 tablet, Rfl: 11   sacubitril -valsartan  (ENTRESTO ) 24-26 MG, Take 1 tablet by mouth 2 (two)  times daily., Disp: 60 tablet, Rfl: 11   spironolactone  (ALDACTONE ) 25 MG tablet, Take 25 mg by mouth daily., Disp: , Rfl:    spironolactone  (ALDACTONE ) 25 MG tablet, Take 1 tablet (25 mg total) by mouth daily., Disp: 90 tablet, Rfl: 3   TRELEGY ELLIPTA 100-62.5-25 MCG/ACT AEPB, Take 1 puff by mouth daily., Disp: , Rfl:  Allergies  Allergen Reactions   Vyndamax  [Tafamidis ] Dermatitis    Allergic dermatitis/rash     Social History   Socioeconomic History   Marital status: Single    Spouse name: Not on file   Number of children: 1    Years of education: Not on file   Highest education level: 10th grade  Occupational History   Occupation: Disability  Tobacco Use   Smoking status: Some Days    Current packs/day: 0.00    Average packs/day: 0.5 packs/day for 50.0 years (25.0 ttl pk-yrs)    Types: Cigarettes    Start date: 03/19/1972    Last attempt to quit: 03/19/2022    Years since quitting: 1.6   Smokeless tobacco: Never   Tobacco comments:    Smoking cessation  Vaping Use   Vaping status: Never Used  Substance and Sexual Activity   Alcohol use: Not on file    Comment: socially   Drug use: Yes    Types: Marijuana    Comment: past   Sexual activity: Not on file  Other Topics Concern   Not on file  Social History Narrative   He lives with his daughter apparently and 5 grandchildren.  He reports that he smokes probably less than a pack of cigarettes a day.  He occasionally drinks a beer or liquor but not daily.   Social Drivers of Corporate Investment Banker Strain: Low Risk  (03/11/2022)   Overall Financial Resource Strain (CARDIA)    Difficulty of Paying Living Expenses: Not very hard  Food Insecurity: No Food Insecurity (01/13/2023)   Hunger Vital Sign    Worried About Running Out of Food in the Last Year: Never true    Ran Out of Food in the Last Year: Never true  Recent Concern: Food Insecurity - Food Insecurity Present (10/26/2022)   Hunger Vital Sign    Worried About Programme Researcher, Broadcasting/film/video in the Last Year: Often true    Ran Out of Food in the Last Year: Often true  Transportation Needs: Unmet Transportation Needs (10/14/2023)   PRAPARE - Administrator, Civil Service (Medical): Yes    Lack of Transportation (Non-Medical): Yes  Physical Activity: Not on file  Stress: Not on file  Social Connections: Not on file  Intimate Partner Violence: Not At Risk (01/13/2023)   Humiliation, Afraid, Rape, and Kick questionnaire    Fear of Current or Ex-Partner: No    Emotionally Abused: No    Physically  Abused: No    Sexually Abused: No    Physical Exam      No future appointments.

## 2023-10-28 ENCOUNTER — Telehealth (HOSPITAL_COMMUNITY): Payer: Self-pay | Admitting: Cardiology

## 2023-10-28 ENCOUNTER — Other Ambulatory Visit: Payer: Self-pay

## 2023-10-28 MED ORDER — FUROSEMIDE 40 MG PO TABS: ORAL_TABLET | ORAL | 3 refills | Status: DC

## 2023-10-28 NOTE — Telephone Encounter (Signed)
 Medication reconciliation needed for lasix Med list has lasix 40 BID Pt is currently taking 40/20 as instructed on 12/13 labs results   Meds updated

## 2023-11-04 LAB — LAB REPORT - SCANNED
EGFR: 16
PSA, Total: 1.1

## 2023-11-08 ENCOUNTER — Other Ambulatory Visit (HOSPITAL_COMMUNITY): Payer: Self-pay

## 2023-11-09 ENCOUNTER — Other Ambulatory Visit (HOSPITAL_COMMUNITY): Payer: Self-pay | Admitting: Emergency Medicine

## 2023-11-09 NOTE — Progress Notes (Signed)
Paramedicine Encounter    Patient ID: Marcus Tran, male    DOB: November 23, 1950, 73 y.o.   MRN: [id number]   Complaints NONE  Assessment A&O x 4, skin W&D w/ good color.  Denies chest pain or SOB.  Lung sounds clear throughout.  No peripheral edema noted.  Compliance with meds YES  Pill box filled n/a   Refills needed NONE  Meds changes since last visit NONE    Social changes NONE   BP 100/70 (BP Location: Right Arm, Patient Position: Sitting, Cuff Size: Normal)   Pulse 63   Resp 16   Wt 146 lb 3.2 oz (66.3 kg)   SpO2 99%   BMI 24.33 kg/m  Weight yesterday- not taken Last visit weight-140 lb (10/26/23)  Today's visit finds Marcus Tran w/o compliant.  I reviewed his bubble packs and he is being compliant w/ same.  His bubble pack is currently packaged w/ 40mg  Furosemide BID.  His correct dose is 40mg  a.m. and 20mg  p.m.  He demonstrated to me that he is cutting his Furosemide  in half for his evening dose.  He keeps the other halves in a spare medicine bottle.  I am confident that he is doing this correctly. Also reviewed his Attruby medication that he is taking in place of the Gregory.  The packaging of this med makes it difficult to keep up with his compliance.  I went through this med and dated each dose in hopes to help keep him on track.  I will re-evaluate his compliance with this med next visit as well.  His last dose of Attruby is 11/25/23.  Also, discussed with Marcus Tran his sleep study that is scheduled for 12/21/23.  He has received paperwork in the mail that needs to be filled out.  I advised him I will assist him with this at next home visit. ACTION: Home visit completed  Bethanie Dicker [phone] 11/09/23  Patient Care Team: Hillery Aldo, NP as PCP - Reatha Armour, MD as PCP - Cardiology (Cardiology)  Patient Active Problem List   Diagnosis Date Noted   Cocaine use 01/15/2023   Noncompliance 01/13/2023   Adenovirus pneumonia 11/12/2022    Chronic HFrEF (heart failure with reduced ejection fraction) (HCC) 08/13/2022   DM2 (diabetes mellitus, type 2) (HCC) 08/13/2022   Obstructive sleep apnea (adult) (pediatric) 08/08/2022   Acute respiratory failure with hypoxia and hypercapnia (HCC) 05/04/2022   Third degree heart block (HCC) 04/15/2022   Snoring 04/15/2022   Nonischemic cardiomyopathy (HCC) 04/15/2022   HFrEF (heart failure with reduced ejection fraction) (HCC) 04/15/2022   COPD with acute exacerbation (HCC) 03/22/2022   Chronic combined systolic and diastolic CHF (congestive heart failure) (HCC)    Tobacco use disorder 03/08/2022   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 Swallow whole., Disp: 90 tablet, Rfl: 3   atorvastatin (LIPITOR) 80 MG tablet, TAKE 1 Tablet BY MOUTH ONCE DAILY  EVERY EVENING, Disp: 90 tablet, Rfl: 3   bisoprolol (ZEBETA) 5 MG tablet, Take 1 tablet (5 mg total) by mouth daily., Disp: 90 tablet, Rfl: 3   dapagliflozin propanediol (FARXIGA) 10 MG TABS tablet, Take 1 tablet (10 mg total) by mouth daily., Disp: 30 tablet, Rfl: 11   furosemide (LASIX) 40 MG tablet, Take 1 tablet (40 mg total) by mouth every morning AND 0.5 tablets (20 mg total) every evening., Disp: 180 tablet, Rfl: 3   ipratropium-albuterol (DUONEB) 0.5-2.5 (3) MG/3ML SOLN, Inhale 3 mLs into the lungs in the morning, at noon, in the evening, and at bedtime., Disp: 360 mL, Rfl: 11   isosorbide-hydrALAZINE  (BIDIL) 20-37.5 MG tablet, TAKE half TABLET BY MOUTH THREE TIMES DAILY (MORNING, noon, evening), Disp: 45 tablet, Rfl: 3   metFORMIN (GLUCOPHAGE) 500 MG tablet, Take 1 tablet (500 mg total) by mouth 2 (two) times daily with a meal., Disp: 60 tablet, Rfl: 0   montelukast (SINGULAIR) 10 MG tablet, TAKE 1 Tablet BY MOUTH ONCE DAILY at bedtime (evening), Disp: 30 tablet, Rfl: 11   sacubitril-valsartan (ENTRESTO) 24-26 MG, Take 1 tablet by mouth 2 (two) times daily., Disp: 60 tablet, Rfl: 11   spironolactone (ALDACTONE) 25 MG tablet, Take 1 tablet (25 mg total) by mouth daily., Disp: 90 tablet, Rfl: 3   TRELEGY ELLIPTA 100-62.5-25 MCG/ACT AEPB, Take 1 puff by mouth daily., Disp: , Rfl:    spironolactone (ALDACTONE) 25 MG tablet, Take 25 mg by mouth daily. (Patient not taking: Reported on 11/09/2023), Disp: , Rfl:  Allergies  Allergen Reactions   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    Allergic dermatitis/rash     Social History   Socioeconomic History   Marital status: Single    Spouse name: Not on file   Number of children: 1   Years of education: Not on file   Highest education level: 10th grade  Occupational History   Occupation: Disability  Tobacco Use   Smoking status: Some Days    Current packs/day: 0.00    Average packs/day: 0.5 packs/day for 50.0 years (25.0 ttl pk-yrs)    Types: Cigarettes    Start date: 03/19/1972    Last attempt to quit: 03/19/2022    Years since quitting: 1.6   Smokeless tobacco: Never   Tobacco comments:    Smoking cessation  Vaping Use   Vaping status: Never Used  Substance and Sexual Activity   Alcohol use: Not on file    Comment: socially   Drug use: Yes    Types: Marijuana    Comment: past   Sexual activity: Not on file  Other Topics Concern   Not on file  Social History Narrative   He lives with his daughter apparently and 5 grandchildren.  He reports that he smokes probably less than a pack of cigarettes a day.  He occasionally drinks a beer or  liquor but not daily.   Social Drivers of Corporate investment banker Strain: Low Risk  (03/11/2022)   Overall Financial Resource Strain (CARDIA)    Difficulty of Paying Living Expenses: Not very hard  Food Insecurity: No Food Insecurity (01/13/2023)   Hunger Vital Sign    Worried About Running Out of Food in the Last Year: Never true    Ran Out of Food in the Last Year: Never true  Recent Concern: Food Insecurity - Food Insecurity Present (10/26/2022)   Hunger Vital Sign    Worried About Running Out of Food in the Last Year: Often true  Ran Out of Food in the Last Year: Often true  Transportation Needs: Unmet Transportation Needs (10/14/2023)   PRAPARE - Administrator, Civil Service (Medical): Yes    Lack of Transportation (Non-Medical): Yes  Physical Activity: Not on file  Stress: Not on file  Social Connections: Not on file  Intimate Partner Violence: Not At Risk (01/13/2023)   Humiliation, Afraid, Rape, and Kick questionnaire    Fear of Current or Ex-Partner: No    Emotionally Abused: No    Physically Abused: No    Sexually Abused: No    Physical Exam      Future Appointments  Date Time Provider Department Center  12/21/2023  8:00 PM Quintella Reichert, MD [facility]

## 2023-11-10 ENCOUNTER — Encounter (HOSPITAL_COMMUNITY): Payer: Self-pay

## 2023-11-16 ENCOUNTER — Other Ambulatory Visit (HOSPITAL_COMMUNITY): Payer: Self-pay

## 2023-11-16 ENCOUNTER — Other Ambulatory Visit: Payer: Self-pay

## 2023-11-18 ENCOUNTER — Telehealth (HOSPITAL_COMMUNITY): Payer: Self-pay | Admitting: Emergency Medicine

## 2023-11-18 ENCOUNTER — Telehealth (HOSPITAL_COMMUNITY): Payer: Self-pay | Admitting: Cardiology

## 2023-11-18 ENCOUNTER — Other Ambulatory Visit (HOSPITAL_COMMUNITY): Payer: Self-pay

## 2023-11-18 ENCOUNTER — Other Ambulatory Visit (HOSPITAL_COMMUNITY): Payer: Self-pay | Admitting: Emergency Medicine

## 2023-11-18 NOTE — Telephone Encounter (Signed)
Called and spoke with Mr. Dripps regarding med change per Amy. Advised him to hold his Furosemide for 2 days and dosing after that will be 30mg . BID. Pt verbalizes he understands same. I will follow up with him tomorrow to ensure he has what he needs to proceed with new med dose regimen.    Beatrix Shipper, EMT-Paramedic [phone] 11/18/2023

## 2023-11-18 NOTE — Telephone Encounter (Signed)
[facility] with para medicine called to report orthostatic b/p during home visit  B/P sitting 80/50 B/P standing 64/38 -strong pulse 88 O2 sats normal -color normal  Denies cp or SOB Reports mild dizziness while standing AM and Noon meds have been taken Minimal PO intake today ~8oz Pt did eat breakfast-bowl of cereal   Please advise

## 2023-11-18 NOTE — Progress Notes (Signed)
Paramedicine Encounter    Patient ID: Marcus Tran, male    DOB: Sep 23, 1951, 73 y.o.   MRN: [id number]   Complaints NONE  Assessment A&O x 4, skin W&D w/ good color.   Denies chest pain or SOB.  Lung sounds clear throughout  Compliance with meds YES  Pill box filled n/a  Bubble packs  Refills needed NONE  Meds changes since last visit NONE    Social changes NONE   Pulse 75   Resp 16   Wt 143 lb 6.4 oz (65 kg)   SpO2 96%   BMI 23.86 kg/m  Weight yesterday- not taken Last visit weight-146lb Cbg 111  On my arrival Marcus Tran was outside enjoying the weather.  A&O x 4, skin W&D w/ good color.  Pt states, "I feel great.)  We went inside to continue visit.  Pt was found to be orthostatic from sitting to standing.  He states he has "a little bit" of dizziness upon standing.  He has been compliant with all his meds.  He states he drank 2 Corona's last night and some pizza for dinner.  This morning he has some cereal and a glass of water with his meds.   Call made to H&V Triage with blood pressures.  Chantel will reach out to provder in the clinic for orders and call me back. While waiting, had pt drink a glass of water and BP increased to 104/68 standing.  HR stayed the same 66 bpm. Read telephone note w/ med change orders and called pt regarding same.  ACTION: Home visit completed  Bethanie Dicker [phone] 11/18/23  Patient Care Team: Hillery Aldo, NP as PCP - Reatha Armour, MD as PCP - Cardiology (Cardiology)  Patient Active Problem List   Diagnosis Date Noted   Cocaine use 01/15/2023   Noncompliance 01/13/2023   Adenovirus pneumonia 11/12/2022   Chronic HFrEF (heart failure with reduced ejection fraction) (HCC) 08/13/2022   DM2 (diabetes mellitus, type 2) (HCC) 08/13/2022   Obstructive sleep apnea (adult) (pediatric) 08/08/2022   Acute respiratory failure with hypoxia and hypercapnia (HCC) 05/04/2022   Third degree heart block (HCC) 04/15/2022    Snoring 04/15/2022   Nonischemic cardiomyopathy (HCC) 04/15/2022   HFrEF (heart failure with reduced ejection fraction) (HCC) 04/15/2022   COPD with acute exacerbation (HCC) 03/22/2022   Chronic combined systolic and diastolic CHF (congestive heart failure) (HCC)    Tobacco use disorder 03/08/2022   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 Swallow whole., Disp: 90 tablet, Rfl: 3   atorvastatin (LIPITOR) 80 MG tablet, TAKE 1 Tablet BY MOUTH ONCE DAILY EVERY EVENING, Disp: 90 tablet, Rfl: 3   bisoprolol (ZEBETA) 5 MG tablet, Take 1 tablet (5 mg total) by mouth daily., Disp: 90 tablet, Rfl: 3   dapagliflozin propanediol (FARXIGA) 10 MG TABS tablet, Take 1 tablet (10 mg total) by mouth daily., Disp: 30 tablet, Rfl: 11  furosemide (LASIX) 40 MG tablet, Take 1 tablet (40 mg total) by mouth every morning AND 0.5 tablets (20 mg total) every evening., Disp: 180 tablet, Rfl: 3   ipratropium-albuterol (DUONEB) 0.5-2.5 (3) MG/3ML SOLN, Inhale 3 mLs into the lungs in the morning, at noon, in the evening, and at bedtime., Disp: 360 mL, Rfl: 11   isosorbide-hydrALAZINE (BIDIL) 20-37.5 MG tablet, TAKE half TABLET BY MOUTH THREE TIMES DAILY (MORNING, noon, evening), Disp: 45 tablet, Rfl: 3   metFORMIN (GLUCOPHAGE) 500 MG tablet, Take 1 tablet (500 mg total) by mouth 2 (two) times daily with a meal., Disp: 60 tablet, Rfl: 0   montelukast (SINGULAIR) 10 MG tablet, TAKE 1 Tablet  BY MOUTH ONCE DAILY at bedtime (evening), Disp: 30 tablet, Rfl: 11   sacubitril-valsartan (ENTRESTO) 24-26 MG, Take 1 tablet by mouth 2 (two) times daily., Disp: 60 tablet, Rfl: 11   spironolactone (ALDACTONE) 25 MG tablet, Take 25 mg by mouth daily. (Patient not taking: Reported on 11/09/2023), Disp: , Rfl:    spironolactone (ALDACTONE) 25 MG tablet, Take 1 tablet (25 mg total) by mouth daily., Disp: 90 tablet, Rfl: 3   TRELEGY ELLIPTA 100-62.5-25 MCG/ACT AEPB, Take 1 puff by mouth daily., Disp: , Rfl:  Allergies  Allergen Reactions   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    Allergic dermatitis/rash     Social History   Socioeconomic History   Marital status: Single    Spouse name: Not on file   Number of children: 1   Years of education: Not on file   Highest education level: 10th grade  Occupational History   Occupation: Disability  Tobacco Use   Smoking status: Some Days    Current packs/day: 0.00    Average packs/day: 0.5 packs/day for 50.0 years (25.0 ttl pk-yrs)    Types: Cigarettes    Start date: 03/19/1972    Last attempt to quit: 03/19/2022    Years since quitting: 1.6   Smokeless tobacco: Never   Tobacco comments:    Smoking cessation  Vaping Use   Vaping status: Never Used  Substance and Sexual Activity   Alcohol use: Not on file    Comment: socially   Drug use: Yes    Types: Marijuana    Comment: past   Sexual activity: Not on file  Other Topics Concern   Not on file  Social History Narrative   He lives with his daughter apparently and 5 grandchildren.  He reports that he smokes probably less than a pack of cigarettes a day.  He occasionally drinks a beer or liquor but not daily.   Social Drivers of Corporate investment banker Strain: Low Risk  (03/11/2022)   Overall Financial Resource Strain (CARDIA)    Difficulty of Paying Living Expenses: Not very hard  Food Insecurity: No Food Insecurity (01/13/2023)   Hunger Vital Sign    Worried About Running Out of Food in  the Last Year: Never true    Ran Out of Food in the Last Year: Never true  Recent Concern: Food Insecurity - Food Insecurity Present (10/26/2022)   Hunger Vital Sign    Worried About Programme researcher, broadcasting/film/video in the Last Year: Often true    Ran Out of Food in the Last Year: Often true  Transportation Needs: Unmet Transportation Needs (10/14/2023)   PRAPARE - Administrator, Civil Service (Medical): Yes    Lack of Transportation (Non-Medical): Yes  Physical Activity: Not on file  Stress: Not on file  Social Connections: Not on file  Intimate Partner Violence: Not At Risk (01/13/2023)   Humiliation, Afraid, Rape, and Kick questionnaire    Fear of Current or Ex-Partner: No    Emotionally Abused: No    Physically Abused: No    Sexually Abused: No    Physical Exam      Future Appointments  Date Time Provider Department Center  12/21/2023  8:00 PM Quintella Reichert, MD [facility]

## 2023-11-18 NOTE — Telephone Encounter (Signed)
Please call instruct to hold lasix x 2 days then start back lasix 30 mg daily.   Casandra Dallaire NP-C  3:55 PM

## 2023-11-19 ENCOUNTER — Other Ambulatory Visit: Payer: Self-pay | Admitting: Pharmacy Technician

## 2023-11-19 ENCOUNTER — Telehealth (HOSPITAL_COMMUNITY): Payer: Self-pay | Admitting: Emergency Medicine

## 2023-11-19 ENCOUNTER — Other Ambulatory Visit (HOSPITAL_COMMUNITY): Payer: Self-pay

## 2023-11-19 MED ORDER — FUROSEMIDE 20 MG PO TABS: 30.0000 mg | ORAL_TABLET | Freq: Every day | ORAL | 6 refills | Status: DC

## 2023-11-19 NOTE — Progress Notes (Signed)
Specialty Pharmacy Refill Coordination Note  Marcus Tran is a 73 y.o. male contacted today regarding refills of specialty medication(s) Acoramidis HCl [name])  Spoke with Daughter  Patient requested Delivery   Delivery date: 11/25/23   Verified address: Patient address [address]  [place]   Medication will be filled on 11/24/23.

## 2023-11-19 NOTE — Telephone Encounter (Signed)
Correction to previous note- Hold Furosemide x 2 days then restart 30mg  daily per Tonye Becket, NP Will update pt regarding dosage change.    Beatrix Shipper, EMT-Paramedic [phone] 11/19/2023

## 2023-11-19 NOTE — Telephone Encounter (Signed)
PT aware of medication changes  New rx to pharmacy as patient has bubble packs  -note paramed aware of med changes via chart messages

## 2023-11-19 NOTE — Addendum Note (Signed)
Addended by: Theresia Bough on: 11/19/2023 11:21 AM   Modules accepted: Orders

## 2023-11-19 NOTE — Telephone Encounter (Signed)
Called Mr. Heckler to review orders from H&V clinic regarding Furosemide dosage for the weekend.   Pt is holding his Furosemide for 2 days per recent orders due to orthostatic blood pressures.  He is to resume Fursemide Sat pm and take 40mg  tablet 1/2 tablet daily to equal 20mg  dose. He will also take 20mg . Sunday.   His new bubble packs [facility].  Due to recent frequent changes in this med, Furosemide will come in a pill bottle  20mg  tablet 1.5 tablet daily to equal 30mg . All this was reviewed w/ [name] I he verbalizes he understands.  I advised him to call me should he have questions and he advised he would do so.    Beatrix Shipper, EMT-Paramedic [phone] 11/19/2023

## 2023-11-23 ENCOUNTER — Other Ambulatory Visit (HOSPITAL_COMMUNITY): Payer: Self-pay | Admitting: Emergency Medicine

## 2023-11-23 NOTE — Progress Notes (Signed)
 Paramedicine Encounter    Patient ID: Marcus Tran, male    DOB: June 15, 1951, 73 y.o.   MRN: [id number]   Complaints NONE  Assessment A&Ox 4, skin W&D w/ good color.  Lung sounds clear throughout and no peripheral edema noted.  Compliance with meds YES   Pill box filled n/a   Refills needed Bubble packs will be delivered tomorrow.    Meds changes since last visit Decreased Furosemide  to 30mg  daily   Social changes NONE   BP 100/60 (BP Location: Left Arm, Patient Position: Sitting, Cuff Size: Normal)   Pulse 74   Resp 14   Wt 143 lb 3.2 oz (65 kg)   SpO2 96%   BMI 23.83 kg/m  Weight yesterday-not taken Last visit weight-143lb  Short visit with Marcus Tran today.  His Furosemide  dosage was changed and wanted to review.  He will not receive his bubble packs until tomorrow.  The pharmacy advised they were unable to deconstruct the bubble packs to remove the 40mg . Furosemide  tablets (am & pm).  They are sending a bottle of 20mg . Furosemide  tablets w/ instructions to take 1.5 tablet daily.  Advised [name] to call me tomorrow when his meds arrive and I will swing by and we will review how to do the changes.    ACTION: Home visit completed  [name] [phone] 11/23/23  Patient Care Team: [name], NP as PCP - Diedre Lavona Agent, MD as PCP - Cardiology (Cardiology)  Patient Active Problem List   Diagnosis Date Noted   Cocaine use 01/15/2023   Noncompliance 01/13/2023   Adenovirus pneumonia 11/12/2022   Chronic HFrEF (heart failure with reduced ejection fraction) (HCC) 08/13/2022   DM2 (diabetes mellitus, type 2) (HCC) 08/13/2022   Obstructive sleep apnea (adult) (pediatric) 08/08/2022   Acute respiratory failure with hypoxia and hypercapnia (HCC) 05/04/2022   Third degree heart block (HCC) 04/15/2022   Snoring 04/15/2022   Nonischemic cardiomyopathy (HCC) 04/15/2022   HFrEF (heart failure with reduced ejection fraction) (HCC) 04/15/2022    COPD with acute exacerbation (HCC) 03/22/2022   Chronic combined systolic and diastolic CHF (congestive heart failure) (HCC)    Tobacco use disorder 03/08/2022   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   HTN (hypertension) 03/08/2022   Stage 3a chronic kidney disease (CKD) (HCC) 03/08/2022   Coronary artery disease 03/08/2022    Current Outpatient Medications:    Acoramidis , 712MG  Twice Daily, (ATTRUBY ) 356 MG TBPK, Take 712 mg by mouth 2 (two) times daily., Disp: 112 each, Rfl: 11   albuterol  (VENTOLIN  HFA) 108 (90 Base) MCG/ACT inhaler, Inhale 2 puffs into the lungs every 4 (four) hours as needed for wheezing or shortness of breath., Disp: 1 each, Rfl: 2   aspirin  EC 81 MG tablet, Take 1 tablet (81 mg total) by mouth daily. Swallow whole., Disp: 90 tablet, Rfl: 3   atorvastatin  (LIPITOR ) 80 MG tablet, TAKE 1 Tablet BY MOUTH ONCE DAILY EVERY EVENING, Disp: 90 tablet, Rfl: 3   bisoprolol  (ZEBETA ) 5 MG tablet, Take 1 tablet (5 mg total) by mouth daily., Disp: 90 tablet, Rfl: 3   dapagliflozin  propanediol (FARXIGA ) 10 MG TABS tablet, Take 1 tablet (10 mg total) by mouth daily., Disp: 30 tablet, Rfl: 11   furosemide  (LASIX ) 20 MG tablet, Take 1.5 tablets (30 mg total) by mouth daily., Disp: 45 tablet, Rfl: 6   ipratropium-albuterol  (DUONEB) 0.5-2.5 (3) MG/3ML SOLN, Inhale 3 mLs into the lungs in the morning, at noon, in the evening, and at  bedtime., Disp: 360 mL, Rfl: 11   isosorbide -hydrALAZINE  (BIDIL ) 20-37.5 MG tablet, TAKE half TABLET BY MOUTH THREE TIMES DAILY (MORNING, noon, evening), Disp: 45 tablet, Rfl: 3   metFORMIN  (GLUCOPHAGE ) 500 MG tablet, Take 1 tablet (500 mg total) by mouth 2 (two) times daily with a meal., Disp: 60 tablet, Rfl: 0   montelukast  (SINGULAIR ) 10 MG tablet, TAKE 1 Tablet BY MOUTH ONCE DAILY at bedtime (evening), Disp: 30 tablet, Rfl: 11   sacubitril -valsartan  (ENTRESTO ) 24-26 MG, Take 1 tablet by mouth 2 (two) times daily., Disp: 60 tablet, Rfl: 11    spironolactone  (ALDACTONE ) 25 MG tablet, Take 25 mg by mouth daily. 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 ] Dermatitis    Allergic dermatitis/rash     Social History   Socioeconomic History   Marital status: Single    Spouse name: Not on file   Number of children: 1   Years of education: Not on file   Highest education level: 10th grade  Occupational History   Occupation: Disability  Tobacco Use   Smoking status: Some Days    Current packs/day: 0.00    Average packs/day: 0.5 packs/day for 50.0 years (25.0 ttl pk-yrs)    Types: Cigarettes    Start date: 03/19/1972    Last attempt to quit: 03/19/2022    Years since quitting: 1.6   Smokeless tobacco: Never   Tobacco comments:    Smoking cessation  Vaping Use   Vaping status: Never Used  Substance and Sexual Activity   Alcohol use: Not on file    Comment: socially   Drug use: Yes    Types: Marijuana    Comment: past   Sexual activity: Not on file  Other Topics Concern   Not on file  Social History Narrative   He lives with his daughter apparently and 5 grandchildren.  He reports that he smokes probably less than a pack of cigarettes a day.  He occasionally drinks a beer or liquor but not daily.   Social Drivers of Corporate Investment Banker Strain: Low Risk  (03/11/2022)   Overall Financial Resource Strain (CARDIA)    Difficulty of Paying Living Expenses: Not very hard  Food Insecurity: No Food Insecurity (01/13/2023)   Hunger Vital Sign    Worried About Running Out of Food in the Last Year: Never true    Ran Out of Food in the Last Year: Never true  Recent Concern: Food Insecurity - Food Insecurity Present (10/26/2022)   Hunger Vital Sign    Worried About  Programme Researcher, Broadcasting/film/video in the Last Year: Often true    Ran Out of Food in the Last Year: Often true  Transportation Needs: Unmet Transportation Needs (10/14/2023)   PRAPARE - Administrator, Civil Service (Medical): Yes    Lack of Transportation (Non-Medical): Yes  Physical Activity: Not on file  Stress: Not on file  Social Connections: Not on file  Intimate Partner Violence: Not At Risk (01/13/2023)   Humiliation, Afraid, Rape, and Kick questionnaire    Fear of Current or Ex-Partner: No    Emotionally Abused: No    Physically Abused: No    Sexually Abused: No    Physical  Exam      Future Appointments  Date Time Provider Department Center  12/21/2023  8:00 PM Shlomo Wilbert SAUNDERS, MD [facility]

## 2023-11-24 ENCOUNTER — Telehealth (HOSPITAL_COMMUNITY): Payer: Self-pay | Admitting: Cardiology

## 2023-11-24 NOTE — Telephone Encounter (Signed)
 Opened in error

## 2023-11-24 NOTE — Telephone Encounter (Deleted)
-----   Message from Nurse Corinda M sent at 11/23/2023  2:43 PM EST ----- Sending as RICK since established with Dr Rolan. Thanks Corinda ----- Message ----- From: Smitty Jonel CROME Sent: 11/23/2023   2:13 PM EST To: Lynwood Schilling, MD; Corinda A MacMillan, RN

## 2023-11-29 IMAGING — CT CT ANGIO CHEST
2 of 7 series · 16 of 46 positions shown · IV contrast (APPLIED)
Comparison: Only comparison is portable chest earlier today.

CLINICAL DATA: Shortness of breath and chest pain.

EXAM:
CT ANGIOGRAPHY CHEST WITH CONTRAST
TECHNIQUE: Multidetector CT imaging of the chest was performed using the
standard protocol during bolus administration of intravenous
contrast. Multiplanar CT image reconstructions and MIPs were
obtained to evaluate the vascular anatomy.

[Series 7: thins · axial · 0.70mm/px · z∈[-245,+33]mm · 13 of 447 slices shown]
[im 25/447  lung]
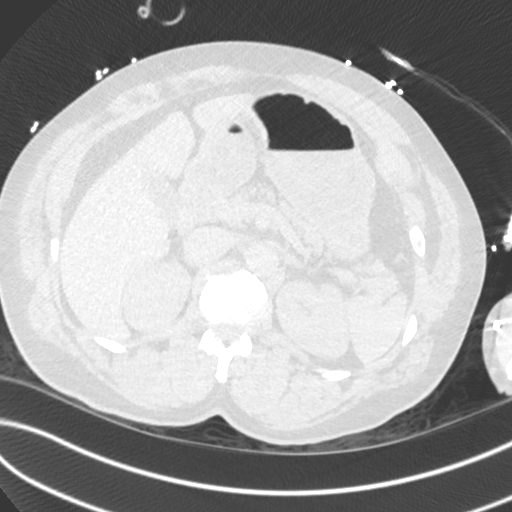
[im 50/447  soft-tissue]
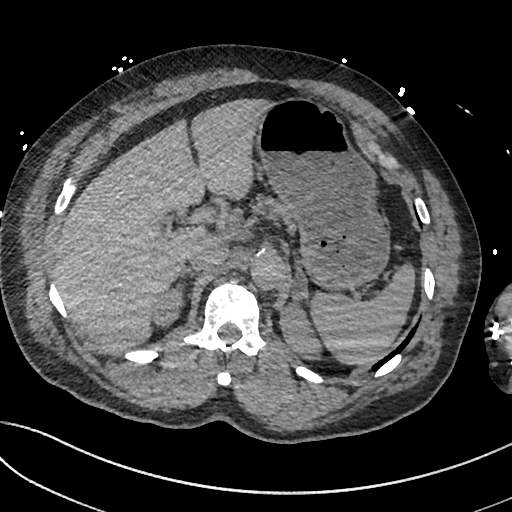
[im 100/447  lung]
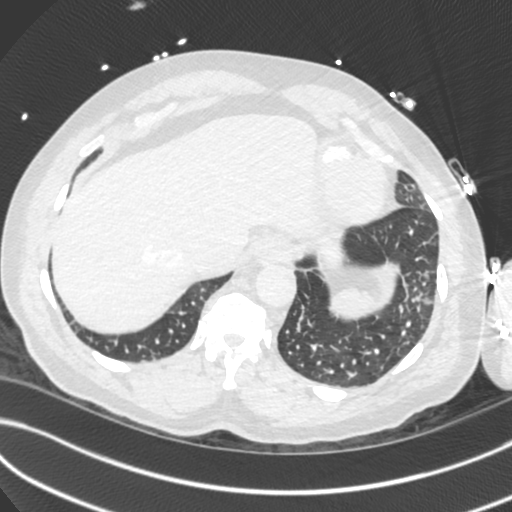
[im 124/447  soft-tissue]
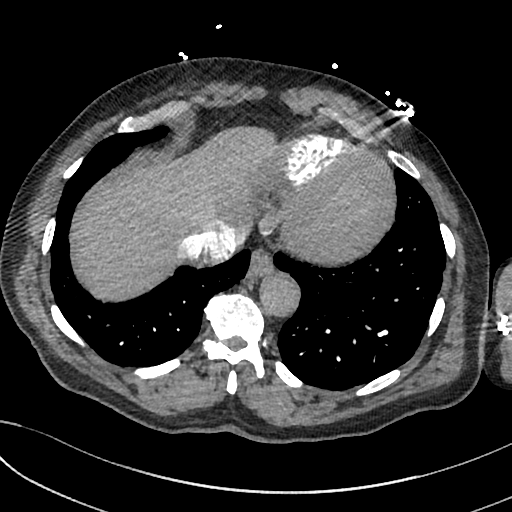
[im 149/447  lung]
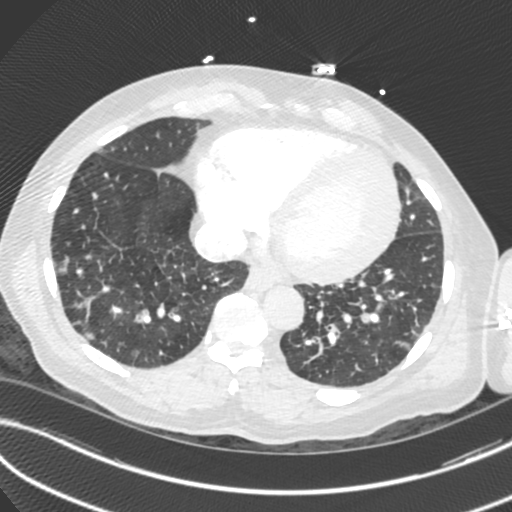
[im 199/447  soft-tissue]
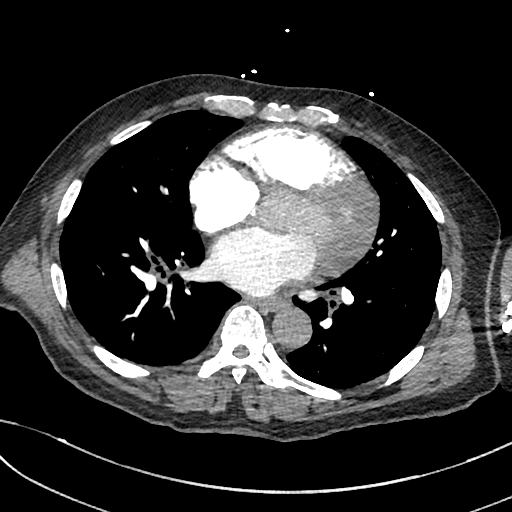
[im 224/447  lung]
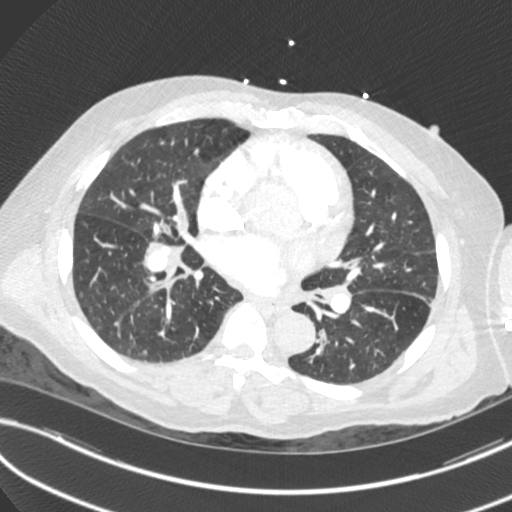
[im 248/447  soft-tissue]
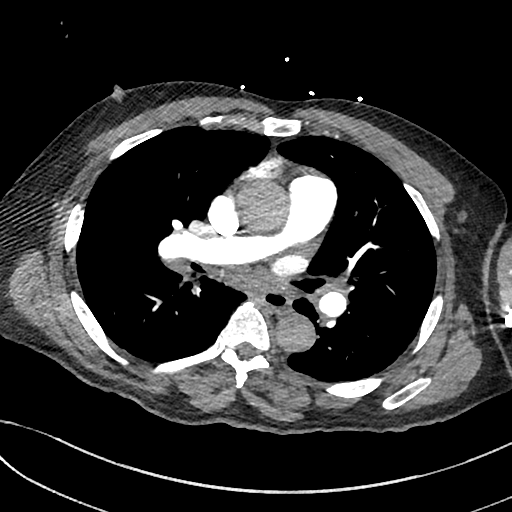
[im 298/447  lung]
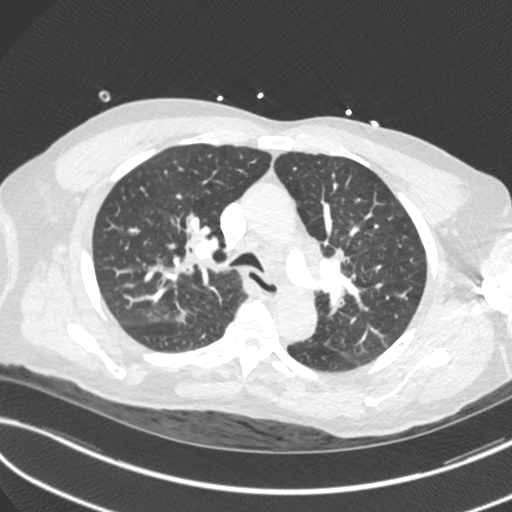
[im 323/447  soft-tissue]
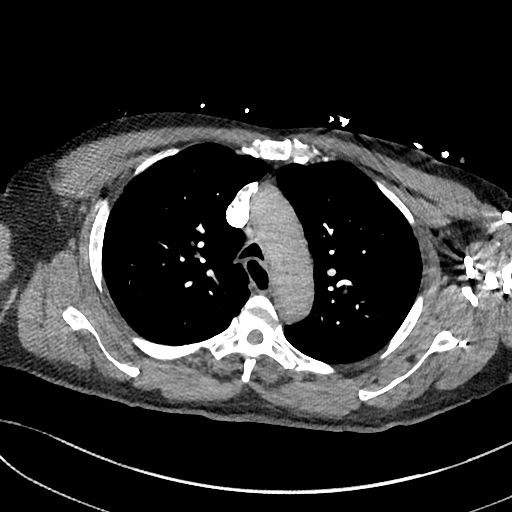
[im 347/447  lung]
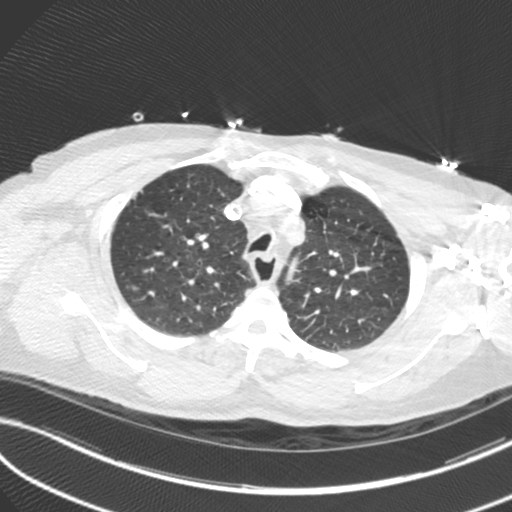
[im 397/447  soft-tissue]
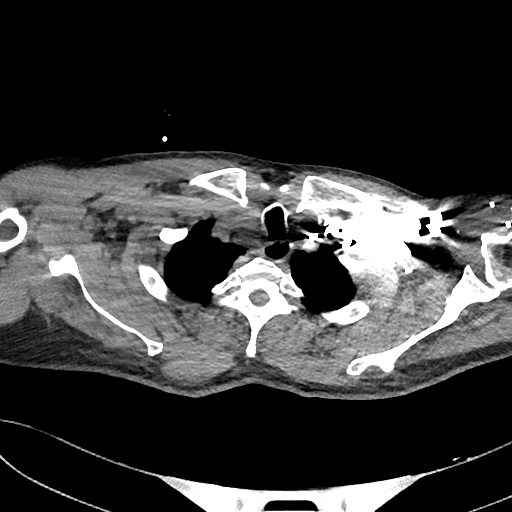
[im 422/447  lung]
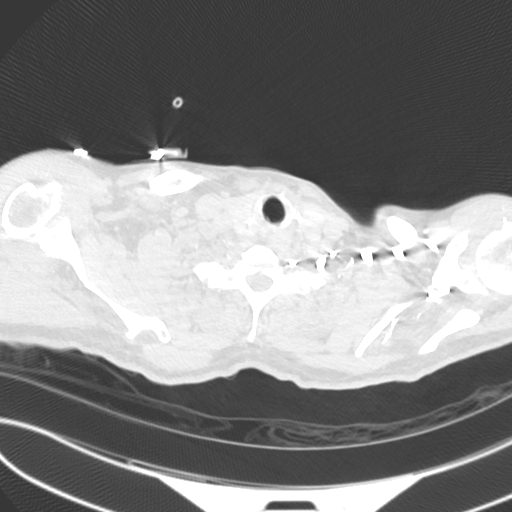

[Series 8: cor · coronal · 0.61mm/px · 3 of 140 slices shown]
[im 35/140  soft-tissue]
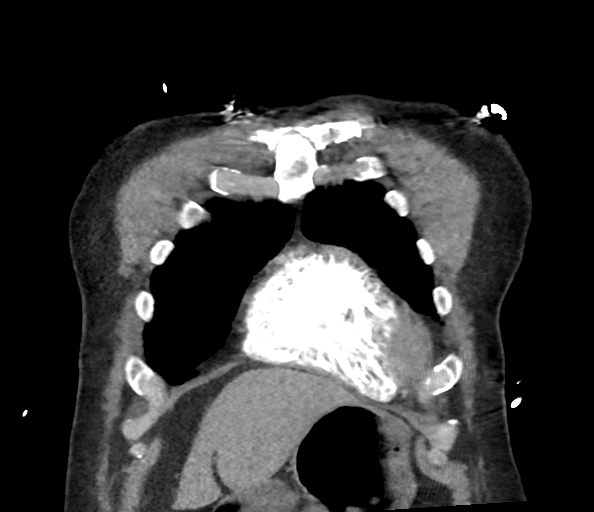
[im 70/140  soft-tissue]
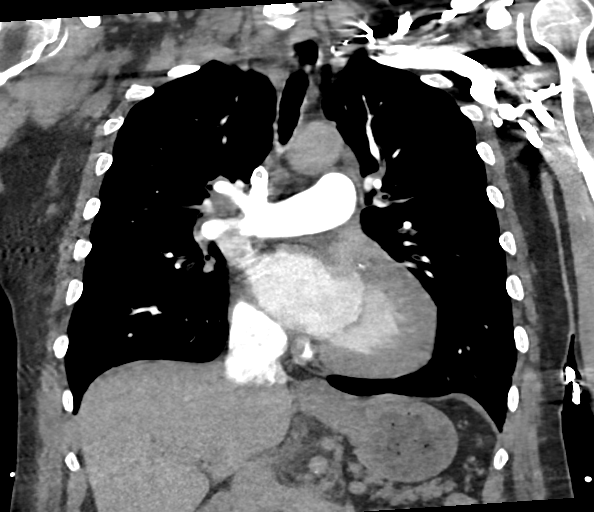
[im 105/140  soft-tissue]
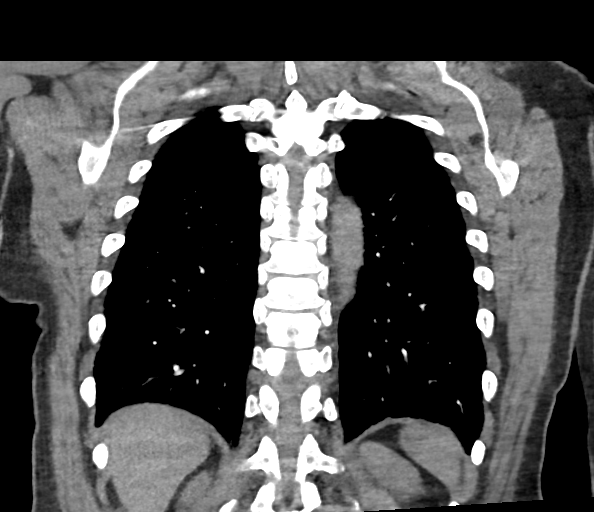

[16 of 46 positions shown; findings below may reference images not displayed]

RADIATION DOSE REDUCTION: This exam was performed according to the
departmental dose-optimization program which includes automated
exposure control, adjustment of the mA and/or kV according to
patient size and/or use of iterative reconstruction technique.

CONTRAST:  75mL OMNIPAQUE IOHEXOL 350 MG/ML SOLN
FINDINGS: Cardiovascular: Mild panchamber cardiomegaly with slightly elevated
RV/LV ratio of 1.06, IVC and hepatic vein reflux suggesting right
heart strain versus tricuspid regurgitation.

The pulmonary arteries are upper-normal in caliber and do not show
appreciable embolic filling defects. There are scattered
three-vessel coronary artery calcifications in the LAD and
circumflex. There is no pericardial effusion.

The superior pulmonary veins mildly distended. There is mild aortic
atherosclerosis tortuosity without aneurysm or dissection. The great
vessels are not well enough opacified to evaluate.

Mediastinum/Nodes: There are mildly prominent bilateral hilar lymph
nodes up to 1.2 cm in short axis on the right, up to 1.1 cm in short
axis on the left.

There are similar mildly prominent subcarinal and precarinal
mediastinal nodes. No thyroid or axillary masses seen. Trachea is
patent.

Lungs/Pleura: There is no pleural effusion, thickening or
pneumothorax. Some respiratory motion is seen on exam.

There is interstitial thickening in the bases which could be due to
chronic interstitial disease, interstitial pneumonitis or mild
interstitial edema, but as above there are no pleural effusions.

The upper lobes show early centrilobular emphysematous changes.
Central airways are small caliber there are thickened bronchial
walls in the upper and lower lobes, to a lesser extent in the right
middle lobe with partial mucous plugging of some of the segmental
left lower lobe bronchi and a few small bronchial impactions both
posterior basal lower lobes.

There is a 6 mm noncalcified posterior basal right lower lobe nodule

There are several additional bilateral scattered tiny subpleural
nodules up to 3 mm in the lower lung fields.

Evidence of scattered air trapping noted in the basal segments of
the lower lobes but no focal pneumonia.

Upper Abdomen: No acute abnormality.

Musculoskeletal: There are mild degenerative changes of the thoracic
spine. No concerning regional skeletal lesion.

Review of the MIP images confirms the above findings.
IMPRESSION: 1. Upper-normal caliber pulmonary arteries without evidence of
embolus.
2. Cardiomegaly, slightly elevated RV/LV ratio and IVC and hepatic
vein reflux, which could be seen with right heart dysfunction/strain
or tricuspid regurgitation.
3. There are mildly prominent superior pulmonary veins, and there is
interstitial thickening in the base of the lungs which could be due
to chronic change, interstitial pneumonitis or interstitial edema.
There is no pleural effusion.
4. Bronchial thickening in all lobes compatible with bronchitis with
additional evidence of small airways disease and small bronchial
impactions in the posterior bases as well as mucoid impaction in
some of the left lower lobe segmental bronchi.
5. Small caliber central airways consistent with bronchospasm or
respiratory phase.
6. Evidence of air trapping in the lower lobe basal segments but no
focal pneumonia.
7. Mildly prominent hilar and mediastinal nodes. No bulky or
encasing adenopathy.
8. 6 mm right lower lobe nodule and a few scattered up to 3 mm
subpleural interstitial micronodules in the lower lung fields.
Recommend a non-contrast Chest CT at 6-12 months, then another
non-contrast Chest CT at 18-24 months. These guidelines do not apply
to immunocompromised patients and patients with cancer. Follow up in
patients with significant comorbidities as clinically warranted. For
lung cancer screening, adhere to Lung-RADS guidelines. Reference:
9. Aortic and coronary artery atherosclerosis.

## 2023-12-01 ENCOUNTER — Other Ambulatory Visit (HOSPITAL_COMMUNITY): Payer: Self-pay | Admitting: Emergency Medicine

## 2023-12-01 NOTE — Progress Notes (Signed)
Paramedicine Encounter    Patient ID: Marcus Tran, male    DOB: 1950/12/10, 73 y.o.   MRN: [id number]   Complaints NONE  Assessment A&O x 4, skin W&D w/ good color. Pt denies chest pain or SOB. Lung sounds w/ ronchi bilat and he reports some green sputum.  He is afebrile. No peripheral edema noted.  Compliance with meds YES   Pill box filled x 1 week  Refills needed none  Meds changes since last visit none    Social changes NONE  Today's visit finds Marcus Tran reporting to be feeling well.  He denies chest pain or SOB.  He does have a mild productive cough w/ green sputum.  Afebrile.  No peripheral edema noted.  I am having to deconstruct his bubble packs into his pill box due to a change in his Furosemide dosing that happened after his bubble packs were filled. Pill box reconciled x 1 week. Next home visit 12/08/23 @ 1:00  BP 90/60 (BP Location: Right Arm, Patient Position: Sitting)   Pulse 68   Wt 145 lb 3.2 oz (65.9 kg)   SpO2 96%   BMI 24.16 kg/m  Weight yesterday- not taken Last visit weight-143lb  ACTION: Home visit completed  Marcus Tran [phone] 12/08/23  Patient Care Team: Marcus Aldo, NP as PCP - Marcus Armour, MD as PCP - Cardiology (Cardiology)  Patient Active Problem List   Diagnosis Date Noted   Cocaine use 01/15/2023   Noncompliance 01/13/2023   Adenovirus pneumonia 11/12/2022   Chronic HFrEF (heart failure with reduced ejection fraction) (HCC) 08/13/2022   DM2 (diabetes mellitus, type 2) (HCC) 08/13/2022   Obstructive sleep apnea (adult) (pediatric) 08/08/2022   Acute respiratory failure with hypoxia and hypercapnia (HCC) 05/04/2022   Third degree heart block (HCC) 04/15/2022   Snoring 04/15/2022   Nonischemic cardiomyopathy (HCC) 04/15/2022   HFrEF (heart failure with reduced ejection fraction) (HCC) 04/15/2022   COPD with acute exacerbation (HCC) 03/22/2022   Chronic combined systolic and diastolic CHF (congestive  heart failure) (HCC)    Tobacco use disorder 03/08/2022   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 Swallow whole., Disp: 90 tablet, Rfl: 3   atorvastatin (LIPITOR) 80 MG tablet, TAKE 1 Tablet BY MOUTH ONCE DAILY EVERY EVENING, Disp: 90 tablet, Rfl: 3   bisoprolol (ZEBETA) 5 MG tablet, Take 1 tablet (5 mg total) by mouth daily., Disp: 90 tablet, Rfl: 3   dapagliflozin propanediol (FARXIGA) 10 MG TABS tablet, Take 1 tablet (10 mg total) by mouth daily., Disp: 30 tablet, Rfl: 11   furosemide (LASIX) 20 MG tablet, Take 1.5 tablets (30 mg total) by mouth daily., Disp: 45 tablet, Rfl: 6   ipratropium-albuterol (DUONEB) 0.5-2.5 (3) MG/3ML SOLN, Inhale 3 mLs into the lungs in the morning, at noon, in the evening, and at bedtime., Disp: 360 mL, Rfl: 11   isosorbide-hydrALAZINE (BIDIL) 20-37.5 MG tablet, TAKE half TABLET  BY MOUTH THREE TIMES DAILY (MORNING, noon, evening), Disp: 45 tablet, Rfl: 3   metFORMIN (GLUCOPHAGE) 500 MG tablet, Take 1 tablet (500 mg total) by mouth 2 (two) times daily with a meal., Disp: 60 tablet, Rfl: 0   montelukast (SINGULAIR) 10 MG tablet, TAKE 1 Tablet BY MOUTH ONCE DAILY at bedtime (evening), Disp: 30 tablet, Rfl: 11   sacubitril-valsartan (ENTRESTO) 24-26 MG, Take 1 tablet by mouth 2 (two) times daily., Disp: 60 tablet, Rfl: 11   spironolactone (ALDACTONE) 25 MG tablet, Take 1 tablet (25 mg total) by mouth daily., Disp: 90 tablet, Rfl: 3    TRELEGY ELLIPTA 100-62.5-25 MCG/ACT AEPB, Take 1 puff by mouth daily., Disp: , Rfl:    spironolactone (ALDACTONE) 25 MG tablet, Take 25 mg by mouth daily. (Patient not taking: Reported on 11/09/2023), Disp: , Rfl:  Allergies  Allergen Reactions   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    Allergic dermatitis/rash     Social History   Socioeconomic History   Marital status: Single    Spouse name: Not on file   Number of children: 1   Years of education: Not on file   Highest education level: 10th grade  Occupational History   Occupation: Disability  Tobacco Use   Smoking status: Some Days    Current packs/day: 0.00    Average packs/day: 0.5 packs/day for 50.0 years (25.0 ttl pk-yrs)    Types: Cigarettes    Start date: 03/19/1972    Last attempt to quit: 03/19/2022    Years since quitting: 1.7   Smokeless tobacco: Never   Tobacco comments:    Smoking cessation  Vaping Use   Vaping status: Never Used  Substance and Sexual Activity   Alcohol use: Not on file    Comment: socially   Drug use: Yes    Types: Marijuana    Comment: past   Sexual activity: Not on file  Other Topics Concern   Not on file  Social History Narrative   He lives with his daughter apparently and 5 grandchildren.  He reports that he smokes probably less than a pack of cigarettes a day.  He occasionally drinks a beer or liquor but not daily.   Social Drivers of Corporate investment banker Strain: Low Risk  (03/11/2022)   Overall Financial Resource Strain (CARDIA)    Difficulty of Paying Living Expenses: Not very hard  Food Insecurity: No Food Insecurity (01/13/2023)   Hunger Vital Sign    Worried About Running Out of Food in the Last Year: Never true    Ran Out of Food in the Last Year: Never true  Recent Concern: Food Insecurity - Food Insecurity Present (10/26/2022)   Hunger Vital Sign    Worried About Programme researcher, broadcasting/film/video in the Last Year: Often true    Ran Out of Food in the Last Year: Often true   Transportation Needs: Unmet Transportation Needs (10/14/2023)   PRAPARE - Administrator, Civil Service (Medical): Yes    Lack of Transportation (Non-Medical): Yes  Physical Activity: Not on file  Stress: Not on file  Social Connections: Not on file  Intimate Partner Violence: Not At Risk (01/13/2023)   Humiliation, Afraid, Rape, and Kick questionnaire    Fear of Current or Ex-Partner: No    Emotionally Abused: No    Physically Abused: No    Sexually Abused: No    Physical Exam      Future Appointments  Date Time Provider Department Center  12/21/2023  8:00 PM Quintella Reichert, MD [facility]

## 2023-12-08 ENCOUNTER — Other Ambulatory Visit (HOSPITAL_COMMUNITY): Payer: Self-pay | Admitting: Emergency Medicine

## 2023-12-08 NOTE — Progress Notes (Unsigned)
Paramedicine Encounter    Patient ID: Marcus Tran, male    DOB: Apr 30, 1951, 73 y.o.   MRN: [id number]   Complaints***  Assessment***  Compliance with meds***  Pill box filled***  Refills needed***  Meds changes since last visit***    Social changes***   There were no vitals taken for this visit. Weight yesterday-*** Last visit weight-***  ACTION: {Paramed [phone]}  Beatrix Shipper, EMT-Paramedic [phone] 12/08/23  Patient Care Team: Hillery Aldo, NP as PCP - General Rollene Rotunda, MD as PCP - Cardiology (Cardiology)  Patient Active Problem List   Diagnosis Date Noted  . Cocaine use 01/15/2023  . Noncompliance 01/13/2023  . Adenovirus pneumonia 11/12/2022  . Chronic HFrEF (heart failure with reduced ejection fraction) (HCC) 08/13/2022  . DM2 (diabetes mellitus, type 2) (HCC) 08/13/2022  . Obstructive sleep apnea (adult) (pediatric) 08/08/2022  . Acute respiratory failure with hypoxia and hypercapnia (HCC) 05/04/2022  . Third degree heart block (HCC) 04/15/2022  . Snoring 04/15/2022  . Nonischemic cardiomyopathy (HCC) 04/15/2022  . HFrEF (heart failure with reduced ejection fraction) (HCC) 04/15/2022  . COPD with acute exacerbation (HCC) 03/22/2022  . Chronic combined systolic and diastolic CHF (congestive heart failure) (HCC)   . Tobacco use disorder 03/08/2022  . 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 HTN (hypertension) 03/08/2022  . Stage 3a chronic kidney disease (CKD) (HCC) 03/08/2022  . Coronary artery disease 03/08/2022    Current Outpatient Medications:  .  Acoramidis, 712MG  Twice Daily, (ATTRUBY) 356 MG TBPK, Take 712 mg by mouth 2 (two) times daily., Disp: 112 each, Rfl: 11 .  albuterol (VENTOLIN HFA) 108 (90 Base) MCG/ACT inhaler, Inhale 2 puffs into the lungs every 4 (four) hours as needed for wheezing or shortness of breath., Disp: 1 each, Rfl: 2 .  aspirin EC 81 MG tablet, Take 1 tablet (81 mg total) by mouth daily.  Swallow whole., Disp: 90 tablet, Rfl: 3 .  atorvastatin (LIPITOR) 80 MG tablet, TAKE 1 Tablet BY MOUTH ONCE DAILY EVERY EVENING, Disp: 90 tablet, Rfl: 3 .  bisoprolol (ZEBETA) 5 MG tablet, Take 1 tablet (5 mg total) by mouth daily., Disp: 90 tablet, Rfl: 3 .  dapagliflozin propanediol (FARXIGA) 10 MG TABS tablet, Take 1 tablet (10 mg total) by mouth daily., Disp: 30 tablet, Rfl: 11 .  furosemide (LASIX) 20 MG tablet, Take 1.5 tablets (30 mg total) by mouth daily., Disp: 45 tablet, Rfl: 6 .  ipratropium-albuterol (DUONEB) 0.5-2.5 (3) MG/3ML SOLN, Inhale 3 mLs into the lungs in the morning, at noon, in the evening, and at bedtime., Disp: 360 mL, Rfl: 11 .  isosorbide-hydrALAZINE (BIDIL) 20-37.5 MG tablet, TAKE half TABLET BY MOUTH THREE TIMES DAILY (MORNING, noon, evening), Disp: 45 tablet, Rfl: 3 .  metFORMIN (GLUCOPHAGE) 500 MG tablet, Take 1 tablet (500 mg total) by mouth 2 (two) times daily with a meal., Disp: 60 tablet, Rfl: 0 .  montelukast (SINGULAIR) 10 MG tablet, TAKE 1 Tablet BY MOUTH ONCE DAILY at bedtime (evening), Disp: 30 tablet, Rfl: 11 .  sacubitril-valsartan (ENTRESTO) 24-26 MG, Take 1 tablet by mouth 2 (two) times daily., Disp: 60 tablet, Rfl: 11 .  spironolactone (ALDACTONE) 25 MG tablet, Take 1 tablet (25 mg total) by mouth daily., Disp: 90 tablet, Rfl: 3 .  TRELEGY ELLIPTA 100-62.5-25 MCG/ACT AEPB, Take 1 puff by mouth daily., Disp: , Rfl:  .  spironolactone (ALDACTONE) 25 MG tablet, Take 25 mg by mouth daily. (Patient not taking: Reported on 12/08/2023), Disp: , Rfl:  Allergies  Allergen Reactions  . 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 Marital status: Single    Spouse name: Not on file  . Number of children: 1  . Years of education: Not on file  . Highest education level: 10th grade  Occupational History  . Occupation: Disability  Tobacco Use  . Smoking status: Some Days    Current packs/day: 0.00     Average packs/day: 0.5 packs/day for 50.0 years (25.0 ttl pk-yrs)    Types: Cigarettes    Start date: 03/19/1972    Last attempt to quit: 03/19/2022    Years since quitting: 1.7  . Smokeless tobacco: Never  . Tobacco comments:    Smoking cessation  Vaping Use  . Vaping status: Never Used  Substance and Sexual Activity  . Alcohol use: Not on file    Comment: socially  . Drug use: Yes    Types: Marijuana    Comment: past  . Sexual activity: Not on file  Other Topics Concern  . Not on file  Social History Narrative   He lives with his daughter apparently and 5 grandchildren.  He reports that he smokes probably less than a pack of cigarettes a day.  He occasionally drinks a beer or liquor but not daily.   Social Drivers of Health   Financial Resource Strain: Low Risk  (03/11/2022)   Overall Financial Resource Strain (CARDIA)   . Difficulty of Paying Living Expenses: Not very hard  Food Insecurity: No Food Insecurity (01/13/2023)   Hunger Vital Sign   . Worried About Programme researcher, broadcasting/film/video in the Last Year: Never true   . Ran Out of Food in the Last Year: Never true  Recent Concern: Food Insecurity - Food Insecurity Present (10/26/2022)   Hunger Vital Sign   . Worried About Programme researcher, broadcasting/film/video in the Last Year: Often true   . Ran Out of Food in the Last Year: Often true  Transportation Needs: Unmet Transportation Needs (10/14/2023)   PRAPARE - Transportation   . Lack of Transportation (Medical): Yes   . Lack of Transportation (Non-Medical): Yes  Physical Activity: Not on file  Stress: Not on file  Social Connections: Not on file  Intimate Partner Violence: Not At Risk (01/13/2023)   Humiliation, Afraid, Rape, and Kick questionnaire   . Fear of Current or Ex-Partner: No   . Emotionally Abused: No   . Physically Abused: No   . Sexually Abused: No    Physical Exam      Future Appointments  Date Time Provider Department Center  12/21/2023  8:00 PM Quintella Reichert, MD [facility]  [facility]

## 2023-12-08 NOTE — Progress Notes (Unsigned)
Paramedicine Encounter    Patient ID: Marcus Tran, male    DOB: 10-06-1951, 73 y.o.   MRN: [id number]   Complaints***  Assessment***  Compliance with meds No.  Took only AM doses of meds and no pm doses.  Pill box filled***  Refills needed***  Meds changes since last visit***    Social changes***   There were no vitals taken for this visit. Weight yesterday-*** Last visit weight-***  ACTION: {Paramed Action:[phone]}  Beatrix Shipper, EMT-Paramedic [phone] 12/08/23  Patient Care Team: Hillery Aldo, NP as PCP - Reatha Armour, MD as PCP - Cardiology (Cardiology)  Patient Active Problem List   Diagnosis Date Noted   Cocaine use 01/15/2023   Noncompliance 01/13/2023   Adenovirus pneumonia 11/12/2022   Chronic HFrEF (heart failure with reduced ejection fraction) (HCC) 08/13/2022   DM2 (diabetes mellitus, type 2) (HCC) 08/13/2022   Obstructive sleep apnea (adult) (pediatric) 08/08/2022   Acute respiratory failure with hypoxia and hypercapnia (HCC) 05/04/2022   Third degree heart block (HCC) 04/15/2022   Snoring 04/15/2022   Nonischemic cardiomyopathy (HCC) 04/15/2022   HFrEF (heart failure with reduced ejection fraction) (HCC) 04/15/2022   COPD with acute exacerbation (HCC) 03/22/2022   Chronic combined systolic and diastolic CHF (congestive heart failure) (HCC)    Tobacco use disorder 03/08/2022   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   HTN (hypertension) 03/08/2022   Stage 3a chronic kidney disease (CKD) (HCC) 03/08/2022   Coronary artery disease 03/08/2022    Current Outpatient Medications:    Acoramidis, 712MG  Twice Daily, (ATTRUBY) 356 MG TBPK, Take 712 mg by mouth 2 (two) times daily., Disp: 112 each, Rfl: 11   albuterol (VENTOLIN HFA) 108 (90 Base) MCG/ACT inhaler, Inhale 2 puffs into the lungs every 4 (four) hours as needed for wheezing or shortness of breath., Disp: 1 each, Rfl: 2   aspirin EC 81 MG tablet, Take 1 tablet (81 mg  total) by mouth daily. Swallow whole., Disp: 90 tablet, Rfl: 3   atorvastatin (LIPITOR) 80 MG tablet, TAKE 1 Tablet BY MOUTH ONCE DAILY EVERY EVENING, Disp: 90 tablet, Rfl: 3   bisoprolol (ZEBETA) 5 MG tablet, Take 1 tablet (5 mg total) by mouth daily., Disp: 90 tablet, Rfl: 3   dapagliflozin propanediol (FARXIGA) 10 MG TABS tablet, Take 1 tablet (10 mg total) by mouth daily., Disp: 30 tablet, Rfl: 11   furosemide (LASIX) 20 MG tablet, Take 1.5 tablets (30 mg total) by mouth daily., Disp: 45 tablet, Rfl: 6   ipratropium-albuterol (DUONEB) 0.5-2.5 (3) MG/3ML SOLN, Inhale 3 mLs into the lungs in the morning, at noon, in the evening, and at bedtime., Disp: 360 mL, Rfl: 11   isosorbide-hydrALAZINE (BIDIL) 20-37.5 MG tablet, TAKE half TABLET BY MOUTH THREE TIMES DAILY (MORNING, noon, evening), Disp: 45 tablet, Rfl: 3   metFORMIN (GLUCOPHAGE) 500 MG tablet, Take 1 tablet (500 mg total) by mouth 2 (two) times daily with a meal., Disp: 60 tablet, Rfl: 0   montelukast (SINGULAIR) 10 MG tablet, TAKE 1 Tablet BY MOUTH ONCE DAILY at bedtime (evening), Disp: 30 tablet, Rfl: 11   sacubitril-valsartan (ENTRESTO) 24-26 MG, Take 1 tablet by mouth 2 (two) times daily., Disp: 60 tablet, Rfl: 11   spironolactone (ALDACTONE) 25 MG tablet, Take 25 mg by mouth daily. 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    Allergic dermatitis/rash     Social History   Socioeconomic History   Marital status: Single    Spouse name: Not on file   Number of children: 1   Years of education: Not on file   Highest education level: 10th grade  Occupational History   Occupation: Disability  Tobacco Use   Smoking status: Some Days    Current packs/day:  0.00    Average packs/day: 0.5 packs/day for 50.0 years (25.0 ttl pk-yrs)    Types: Cigarettes    Start date: 03/19/1972    Last attempt to quit: 03/19/2022    Years since quitting: 1.7   Smokeless tobacco: Never   Tobacco comments:    Smoking cessation  Vaping Use   Vaping status: Never Used  Substance and Sexual Activity   Alcohol use: Not on file    Comment: socially   Drug use: Yes    Types: Marijuana    Comment: past   Sexual activity: Not on file  Other Topics Concern   Not on file  Social History Narrative   He lives with his daughter apparently and 5 grandchildren.  He reports that he smokes probably less than a pack of cigarettes a day.  He occasionally drinks a beer or liquor but not daily.   Social Drivers of Corporate investment banker Strain: Low Risk  (03/11/2022)   Overall Financial Resource Strain (CARDIA)    Difficulty of Paying Living Expenses: Not very hard  Food Insecurity: No Food Insecurity (01/13/2023)   Hunger Vital Sign    Worried About Running Out of Food in the Last Year: Never true    Ran Out of Food in the Last Year: Never true  Recent Concern: Food Insecurity - Food Insecurity Present (10/26/2022)   Hunger Vital Sign    Worried About Programme researcher, broadcasting/film/video in the Last Year: Often true    Ran Out of Food in the Last Year: Often true  Transportation Needs: Unmet Transportation Needs (10/14/2023)   PRAPARE - Administrator, Civil Service (Medical): Yes    Lack of Transportation (Non-Medical): Yes  Physical Activity: Not on file  Stress: Not on file  Social Connections: Not on file  Intimate Partner Violence: Not At Risk (01/13/2023)   Humiliation, Afraid, Rape, and Kick questionnaire    Fear of Current or Ex-Partner: No    Emotionally Abused: No    Physically Abused: No    Sexually Abused: No    Physical Exam      Future Appointments  Date Time Provider Department Center  12/21/2023  8:00 PM Quintella Reichert, MD [facility]

## 2023-12-09 ENCOUNTER — Telehealth (HOSPITAL_COMMUNITY): Payer: Self-pay | Admitting: Licensed Clinical Social Worker

## 2023-12-09 NOTE — Telephone Encounter (Signed)
H&V Care Navigation CSW Progress Note  Clinical Social Worker informed by community paramedic that pt phone is broken- CSW informed we could potentially assist in getting a new phone- paramedic to discuss with patient at next in person visit since pt cannot communicate with [place] by phone at this time.  Paramedic also mentioned pt desire to get into housing on his own.  CSW has provided housing lists in the past.  Provided number to Housing Authority to apply for project based voucher program.  Also provided with list of required documents when applying for housing to ensure he has everything he needs to begin process.  Will continue to follow and assist as needed   SDOH Screenings   Food Insecurity: No Food Insecurity (01/13/2023)  Recent Concern: Food Insecurity - Food Insecurity Present (10/26/2022)  Housing: Medium Risk (09/24/2023)  Transportation Needs: Unmet Transportation Needs (10/14/2023)  Utilities: Not At Risk (01/13/2023)  Alcohol Screen: Low Risk  (05/06/2022)  Financial Resource Strain: Low Risk  (03/11/2022)  Tobacco Use: High Risk (09/24/2023)   Burna Sis, LCSW Clinical Social Worker [facility] Desk#: [phone] Cell#: [phone]

## 2023-12-13 ENCOUNTER — Telehealth (HOSPITAL_COMMUNITY): Payer: Self-pay | Admitting: Internal Medicine

## 2023-12-13 IMAGING — CT CT ANGIO CHEST
2 of 6 series · 18 of 36 positions shown · IV contrast (agent unspecified)
Comparison: Radiograph earlier today.  Chest CTA 2 weeks ago

CLINICAL DATA: Pulmonary embolism (PE) suspected, neg D-dimer

Shortness of breath and cough.  Hypoxia.
EXAM:
CT ANGIOGRAPHY CHEST WITH CONTRAST
TECHNIQUE: Multidetector CT imaging of the chest was performed using the
standard protocol during bolus administration of intravenous
contrast. Multiplanar CT image reconstructions and MIPs were
obtained to evaluate the vascular anatomy.

[Series 7: pe thins · axial · 0.81mm/px · z∈[+1206,+1484]mm · 17 of 440 slices shown]
[im 22/440  lung]
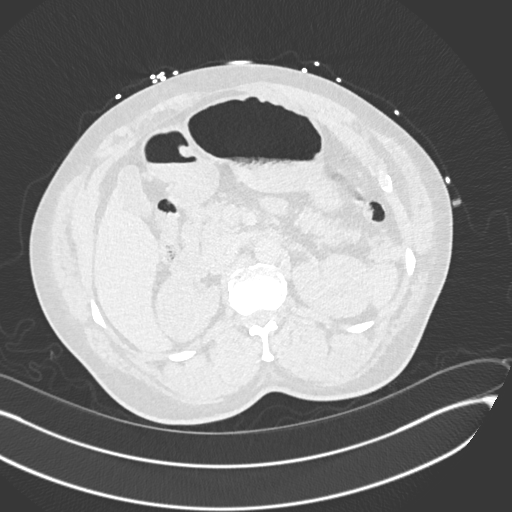
[im 44/440  mediastinal]
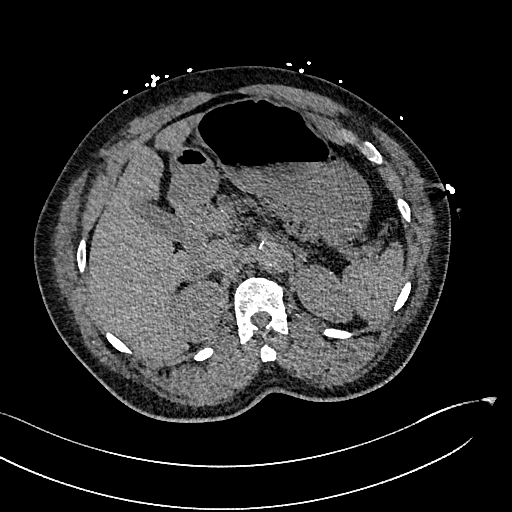
[im 66/440  lung]
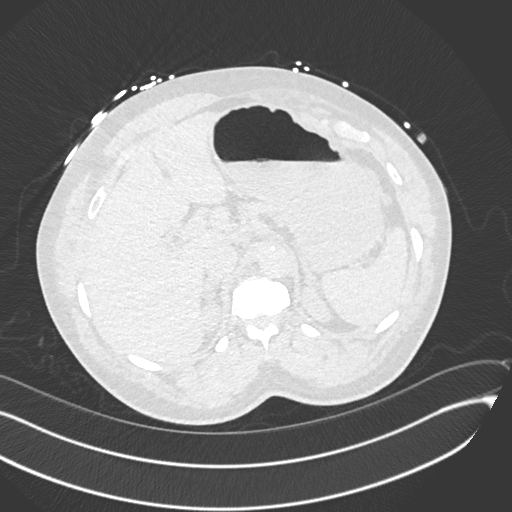
[im 88/440  mediastinal]
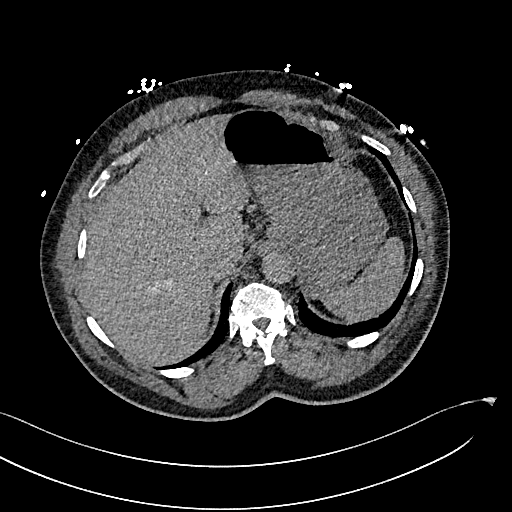
[im 132/440  lung]
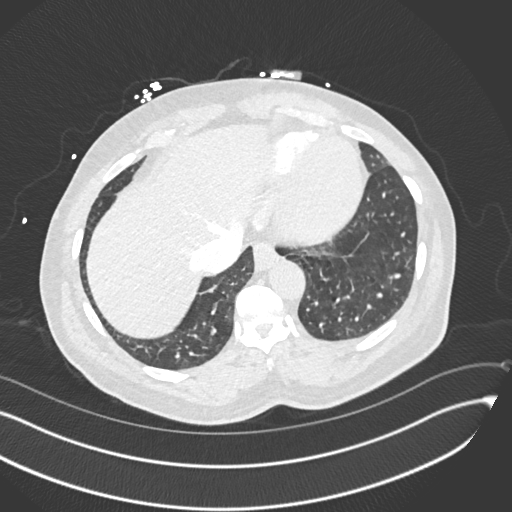
[im 154/440  mediastinal]
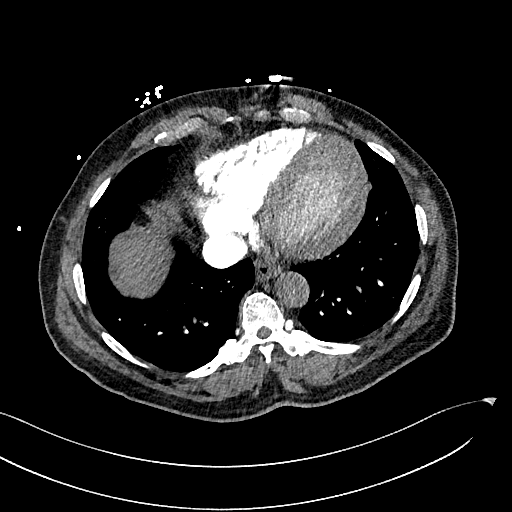
[im 176/440  lung]
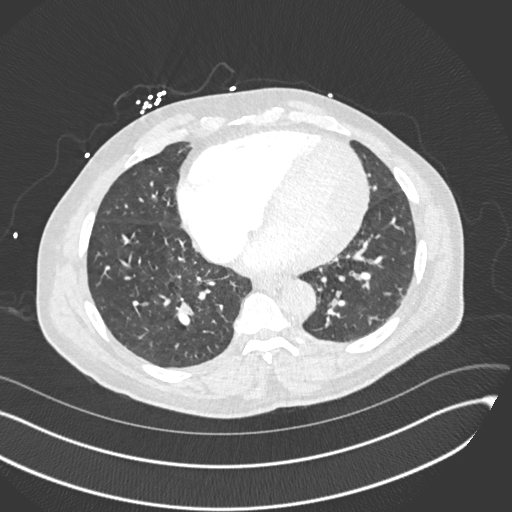
[im 198/440  mediastinal]
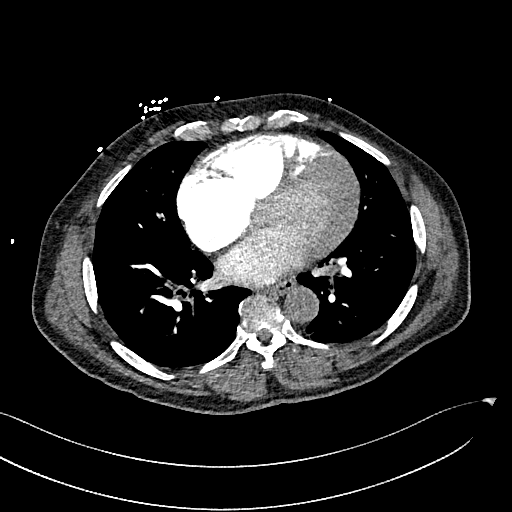
[im 220/440  lung]
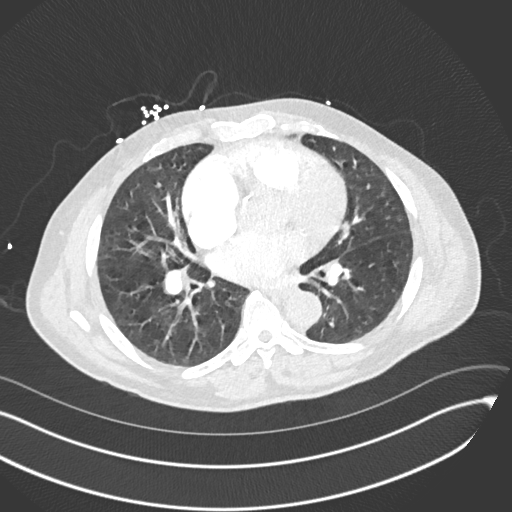
[im 242/440  mediastinal]
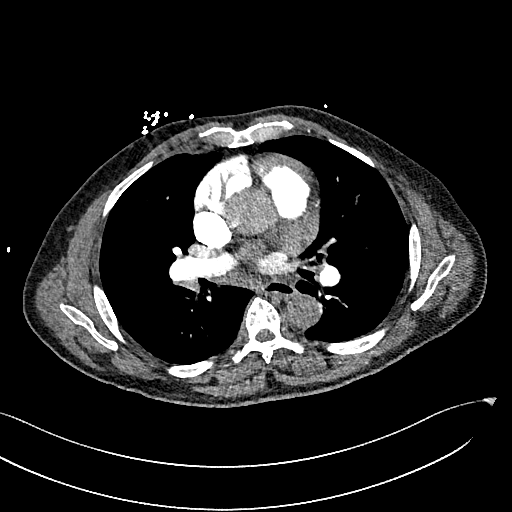
[im 264/440  lung]
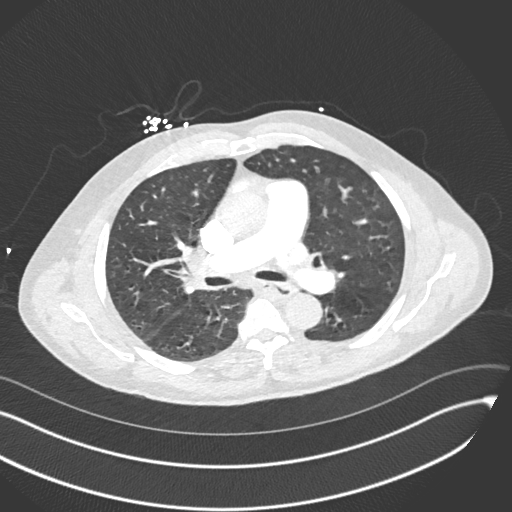
[im 286/440  mediastinal]
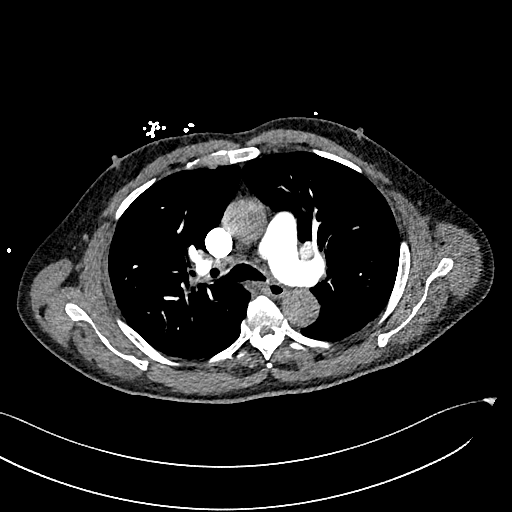
[im 308/440  lung]
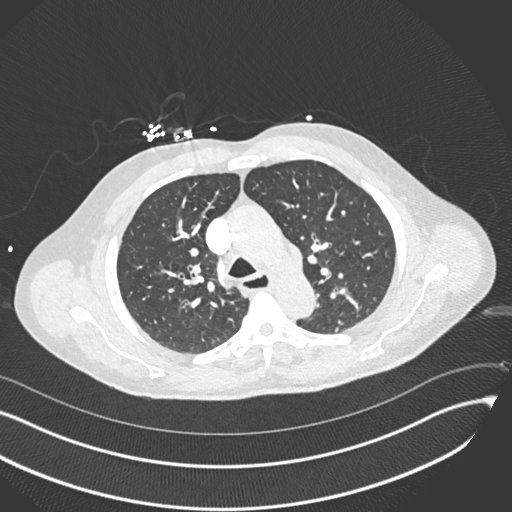
[im 352/440  mediastinal]
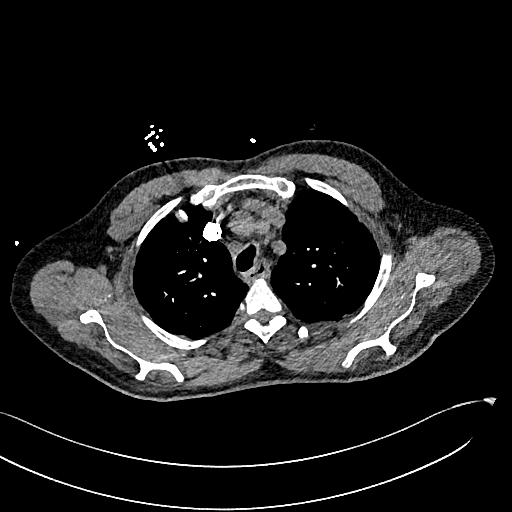
[im 374/440  lung]
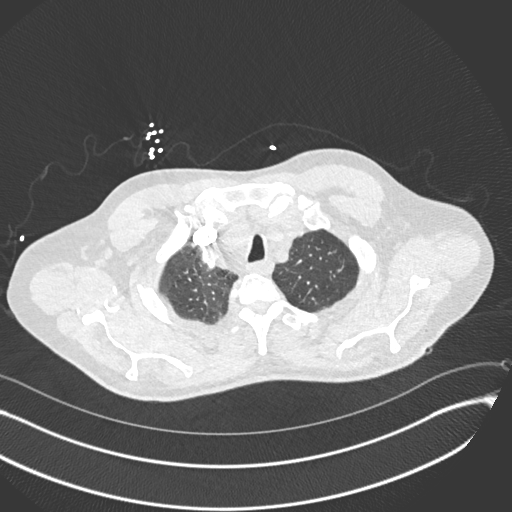
[im 396/440  mediastinal]
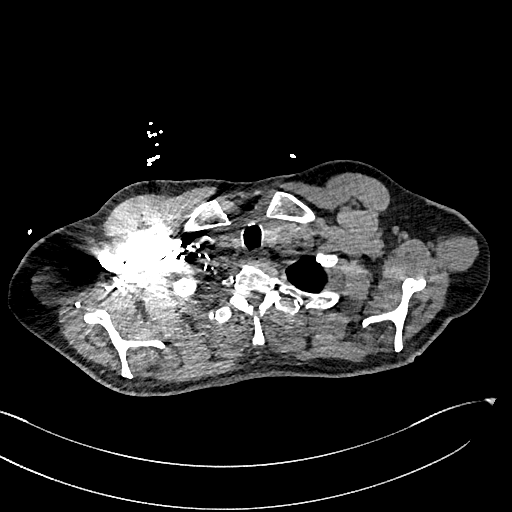
[im 418/440  lung]
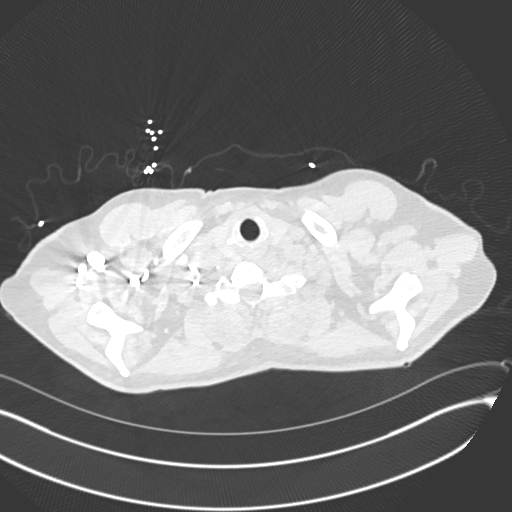

[Series 8: pe 2mm cor · coronal · 0.66mm/px · 1 of 151 slices shown]
[im 76/151  mediastinal]
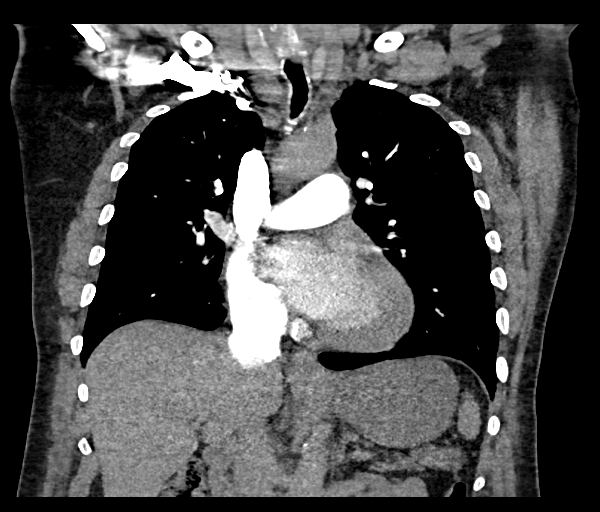

[18 of 36 positions shown; findings below may reference images not displayed]

RADIATION DOSE REDUCTION: This exam was performed according to the
departmental dose-optimization program which includes automated
exposure control, adjustment of the mA and/or kV according to
patient size and/or use of iterative reconstruction technique.

CONTRAST:  65mL OMNIPAQUE IOHEXOL 350 MG/ML SOLN
FINDINGS: Cardiovascular: There are no filling defects within the pulmonary
arteries to suggest pulmonary embolus. Mild atherosclerosis of the
thoracic aorta. Mild multi chamber cardiomegaly. There is contrast
refluxing into the hepatic veins and IVC. No pericardial effusion.

Mediastinum/Nodes: Shotty mediastinal and hilar lymph nodes, all
less than 15 mm short axis. Similar findings on prior exam. Tiny
hiatal hernia. No suspicious thyroid nodule.

Lungs/Pleura: Mild emphysema. There is moderate bronchial
thickening, areas of mucoid impaction and mucous plugging within
both lower lobes. The previous right lower lobe pulmonary nodule has
resolved from prior exam consistent with infectious or inflammatory
etiology. Improve subpleural interstitial thickening and diminished
tiny subpleural nodules from prior. There is no acute or confluent
airspace disease. No pleural effusion.

Upper Abdomen: Contrast refluxes into the hepatic veins. Splenic
flexure colonic diverticulosis incidentally noted. There is a small
hiatal hernia.

Musculoskeletal: Mild thoracic spondylosis with spurring. There are
no acute or suspicious osseous abnormalities.

Review of the MIP images confirms the above findings.
IMPRESSION: 1. No pulmonary embolus.
2. Moderate bronchial thickening, areas of mucoid impaction and
mucous plugging within both lower lobes, also seen on prior exam.
3. Mild multi chamber cardiomegaly with contrast refluxing into the
hepatic veins and IVC consistent with elevated right heart
pressures.
4. Previous right lower lobe pulmonary nodule has resolved from
prior exam consistent with infectious or inflammatory etiology.

Aortic Atherosclerosis (7685P-X10.0) and Emphysema (7685P-OGN.D).

## 2023-12-13 IMAGING — DX DG CHEST 1V PORT
1 series · 1 of 1 positions shown · non-contrast
Comparison: Chest x-ray 03/06/2022, CT chest 03/06/2022

CLINICAL DATA: cough

EXAM:
PORTABLE CHEST 1 VIEW

[chest]
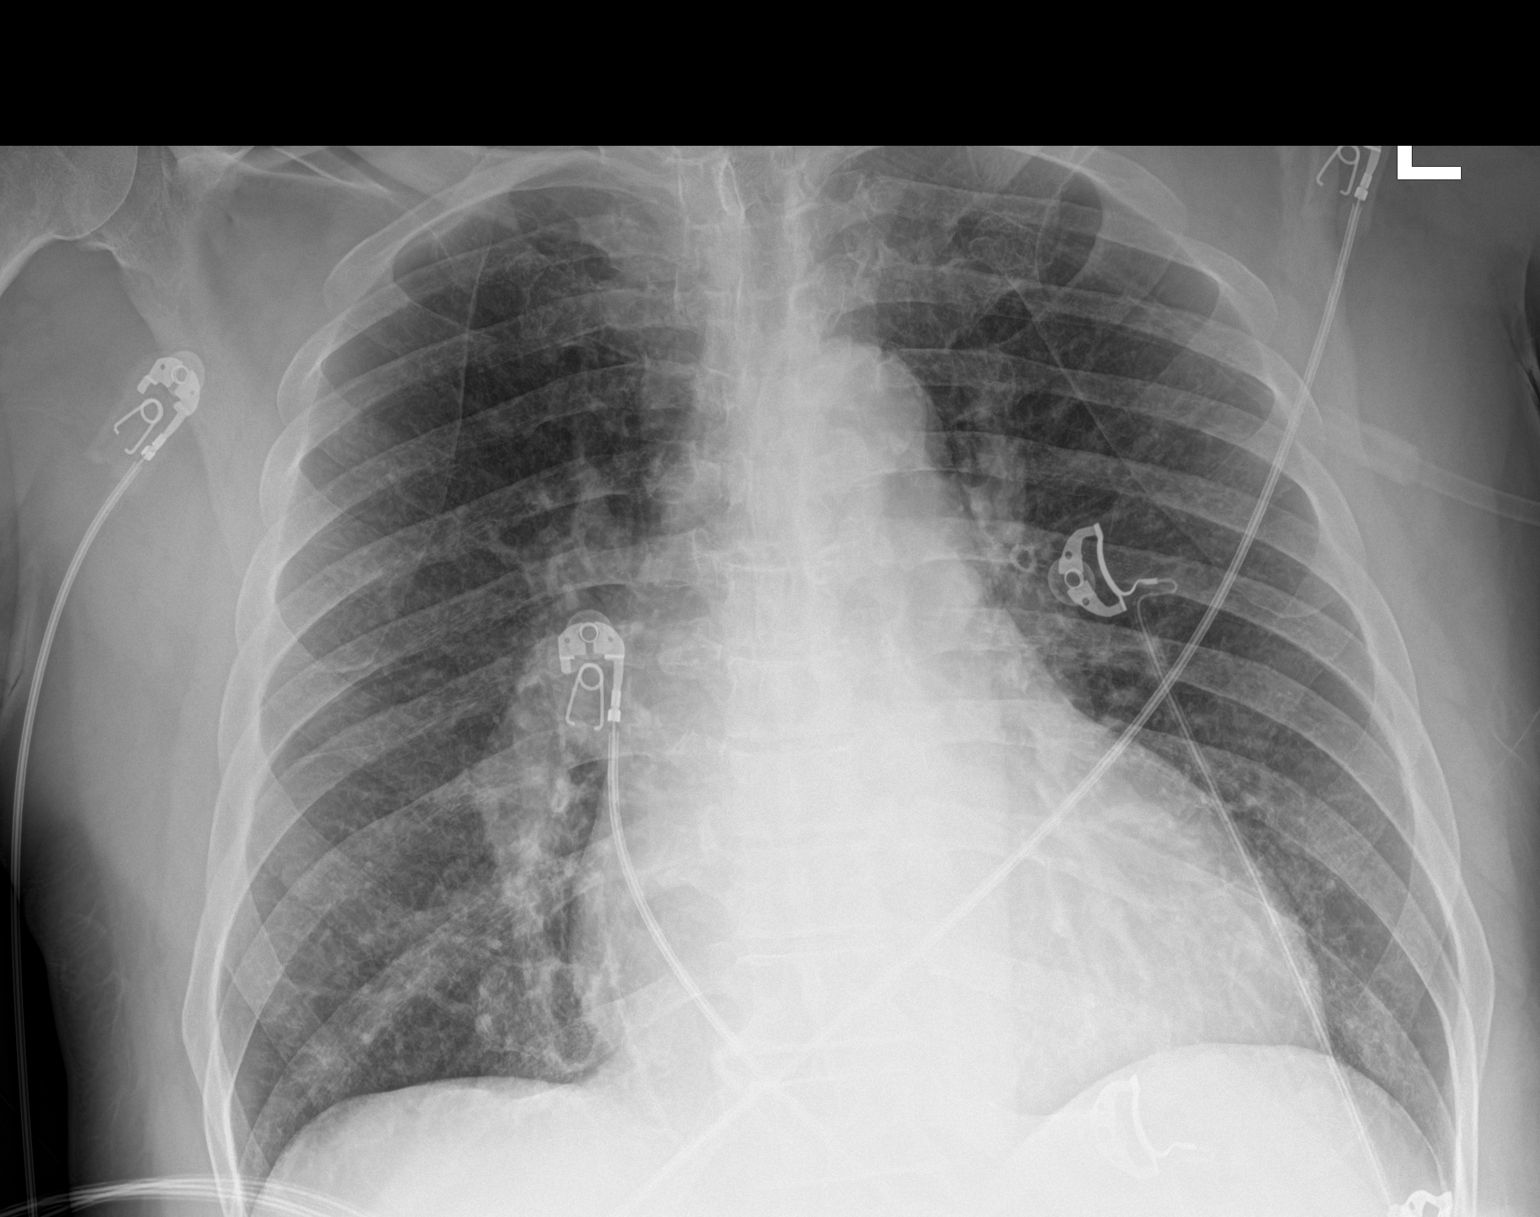

[1 of 1 positions shown; findings below may reference images not displayed]

FINDINGS: Cardiomegaly.  The heart and mediastinal contours are unchanged.

No focal consolidation. No pulmonary edema. No pleural effusion. No
pneumothorax.

No acute osseous abnormality.
IMPRESSION: Cardiomegaly with no radiographic findings to suggest acute
cardiopulmonary abnormality.

## 2023-12-14 ENCOUNTER — Other Ambulatory Visit (HOSPITAL_COMMUNITY): Payer: Self-pay

## 2023-12-14 ENCOUNTER — Other Ambulatory Visit: Payer: Self-pay

## 2023-12-14 NOTE — Progress Notes (Signed)
 Specialty Pharmacy Refill Coordination Note  Marcus Tran is a 73 y.o. male contacted today regarding refills of specialty medication(s) Acoramidis HCl [name])   Patient requested Delivery   Delivery date: 12/21/23   Verified address: [address]  [place] [address]   Medication will be filled on 12/20/23.

## 2023-12-15 ENCOUNTER — Telehealth (HOSPITAL_COMMUNITY): Payer: Self-pay | Admitting: Licensed Clinical Social Worker

## 2023-12-15 NOTE — Telephone Encounter (Signed)
 CSW attempted to call pt to discuss housing concerns- unable to reach- left VM requesting return call  Burna Sis, LCSW Clinical Social Worker [facility] Desk#: [phone] Cell#: [phone]

## 2023-12-16 ENCOUNTER — Telehealth (HOSPITAL_COMMUNITY): Payer: Self-pay | Admitting: Emergency Medicine

## 2023-12-16 NOTE — Telephone Encounter (Signed)
 Called H&V @ 8:15 this morning and scheduled Mr. Baena clinic appointment 01/03/24 @ 2:00.  Pt notified of same.    Beatrix Shipper, EMT-Paramedic [phone] 12/16/2023

## 2023-12-17 ENCOUNTER — Telehealth (HOSPITAL_COMMUNITY): Payer: Self-pay | Admitting: Emergency Medicine

## 2023-12-17 NOTE — Telephone Encounter (Signed)
 Called [name] to let him know his sleep study appointment has been rescheduled to 02/16/24 @ 8:00 pm.  Pt acknowledges same.    Beatrix Shipper, EMT-Paramedic [phone] 12/17/2023

## 2023-12-20 ENCOUNTER — Other Ambulatory Visit: Payer: Self-pay

## 2023-12-21 ENCOUNTER — Encounter (HOSPITAL_BASED_OUTPATIENT_CLINIC_OR_DEPARTMENT_OTHER): Payer: 59 | Admitting: Cardiology

## 2023-12-22 ENCOUNTER — Other Ambulatory Visit (HOSPITAL_COMMUNITY): Payer: Self-pay | Admitting: Emergency Medicine

## 2023-12-22 ENCOUNTER — Telehealth (HOSPITAL_COMMUNITY): Payer: Self-pay | Admitting: Licensed Clinical Social Worker

## 2023-12-22 NOTE — Progress Notes (Unsigned)
 Paramedicine Encounter    Patient ID: Marcus Tran, male    DOB: 1951/08/31, 73 y.o.   MRN: [id number]   Complaints***  Assessment***  Compliance with meds***  Pill box filled***  Refills needed***  Meds changes since last visit***    Social changes***   There were no vitals taken for this visit. Weight yesterday-*** Last visit weight-***  ACTION: {Paramed Action:[phone]}  Beatrix Shipper, EMT-Paramedic [phone] 12/22/23  Patient Care Team: Hillery Aldo, NP as PCP - General Rollene Rotunda, MD as PCP - Cardiology (Cardiology)  Patient Active Problem List   Diagnosis Date Noted  . Cocaine use 01/15/2023  . Noncompliance 01/13/2023  . Adenovirus pneumonia 11/12/2022  . Chronic HFrEF (heart failure with reduced ejection fraction) (HCC) 08/13/2022  . DM2 (diabetes mellitus, type 2) (HCC) 08/13/2022  . Obstructive sleep apnea (adult) (pediatric) 08/08/2022  . Acute respiratory failure with hypoxia and hypercapnia (HCC) 05/04/2022  . Third degree heart block (HCC) 04/15/2022  . Snoring 04/15/2022  . Nonischemic cardiomyopathy (HCC) 04/15/2022  . HFrEF (heart failure with reduced ejection fraction) (HCC) 04/15/2022  . COPD with acute exacerbation (HCC) 03/22/2022  . Chronic combined systolic and diastolic CHF (congestive heart failure) (HCC)   . Tobacco use disorder 03/08/2022  . 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 HTN (hypertension) 03/08/2022  . Stage 3a chronic kidney disease (CKD) (HCC) 03/08/2022  . Coronary artery disease 03/08/2022    Current Outpatient Medications:  .  Acoramidis, 712MG  Twice Daily, (ATTRUBY) 356 MG TBPK, Take 712 mg by mouth 2 (two) times daily., Disp: 112 each, Rfl: 11 .  albuterol (VENTOLIN HFA) 108 (90 Base) MCG/ACT inhaler, Inhale 2 puffs into the lungs every 4 (four) hours as needed for wheezing or shortness of breath., Disp: 1 each, Rfl: 2 .  aspirin EC 81 MG tablet, Take 1 tablet (81 mg total) by mouth daily.  Swallow whole., Disp: 90 tablet, Rfl: 3 .  atorvastatin (LIPITOR) 80 MG tablet, TAKE 1 Tablet BY MOUTH ONCE DAILY EVERY EVENING, Disp: 90 tablet, Rfl: 3 .  bisoprolol (ZEBETA) 5 MG tablet, Take 1 tablet (5 mg total) by mouth daily., Disp: 90 tablet, Rfl: 3 .  dapagliflozin propanediol (FARXIGA) 10 MG TABS tablet, Take 1 tablet (10 mg total) by mouth daily., Disp: 30 tablet, Rfl: 11 .  furosemide (LASIX) 20 MG tablet, Take 1.5 tablets (30 mg total) by mouth daily., Disp: 45 tablet, Rfl: 6 .  ipratropium-albuterol (DUONEB) 0.5-2.5 (3) MG/3ML SOLN, Inhale 3 mLs into the lungs in the morning, at noon, in the evening, and at bedtime., Disp: 360 mL, Rfl: 11 .  isosorbide-hydrALAZINE (BIDIL) 20-37.5 MG tablet, TAKE half TABLET BY MOUTH THREE TIMES DAILY (MORNING, noon, evening), Disp: 45 tablet, Rfl: 3 .  metFORMIN (GLUCOPHAGE) 500 MG tablet, Take 1 tablet (500 mg total) by mouth 2 (two) times daily with a meal., Disp: 60 tablet, Rfl: 0 .  montelukast (SINGULAIR) 10 MG tablet, TAKE 1 Tablet BY MOUTH ONCE DAILY at bedtime (evening), Disp: 30 tablet, Rfl: 11 .  sacubitril-valsartan (ENTRESTO) 24-26 MG, Take 1 tablet by mouth 2 (two) times daily., Disp: 60 tablet, Rfl: 11 .  spironolactone (ALDACTONE) 25 MG tablet, Take 25 mg by mouth daily. (Patient not taking: Reported on 12/08/2023), Disp: , Rfl:  .  spironolactone (ALDACTONE) 25 MG tablet, Take 1 tablet (25 mg total) by mouth daily., Disp: 90 tablet, Rfl: 3 .  TRELEGY ELLIPTA 100-62.5-25 MCG/ACT AEPB, Take 1 puff by mouth daily., Disp: , Rfl:  Allergies  Allergen Reactions  . 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 Marital status: Single    Spouse name: Not on file  . Number of children: 1  . Years of education: Not on file  . Highest education level: 10th grade  Occupational History  . Occupation: Disability  Tobacco Use  . Smoking status: Some Days    Current packs/day: 0.00     Average packs/day: 0.5 packs/day for 50.0 years (25.0 ttl pk-yrs)    Types: Cigarettes    Start date: 03/19/1972    Last attempt to quit: 03/19/2022    Years since quitting: 1.7  . Smokeless tobacco: Never  . Tobacco comments:    Smoking cessation  Vaping Use  . Vaping status: Never Used  Substance and Sexual Activity  . Alcohol use: Not on file    Comment: socially  . Drug use: Yes    Types: Marijuana    Comment: past  . Sexual activity: Not on file  Other Topics Concern  . Not on file  Social History Narrative   He lives with his daughter apparently and 5 grandchildren.  He reports that he smokes probably less than a pack of cigarettes a day.  He occasionally drinks a beer or liquor but not daily.   Social Drivers of Health   Financial Resource Strain: Low Risk  (03/11/2022)   Overall Financial Resource Strain (CARDIA)   . Difficulty of Paying Living Expenses: Not very hard  Food Insecurity: No Food Insecurity (01/13/2023)   Hunger Vital Sign   . Worried About Programme researcher, broadcasting/film/video in the Last Year: Never true   . Ran Out of Food in the Last Year: Never true  Recent Concern: Food Insecurity - Food Insecurity Present (10/26/2022)   Hunger Vital Sign   . Worried About Programme researcher, broadcasting/film/video in the Last Year: Often true   . Ran Out of Food in the Last Year: Often true  Transportation Needs: Unmet Transportation Needs (10/14/2023)   PRAPARE - Transportation   . Lack of Transportation (Medical): Yes   . Lack of Transportation (Non-Medical): Yes  Physical Activity: Not on file  Stress: Not on file  Social Connections: Not on file  Intimate Partner Violence: Not At Risk (01/13/2023)   Humiliation, Afraid, Rape, and Kick questionnaire   . Fear of Current or Ex-Partner: No   . Emotionally Abused: No   . Physically Abused: No   . Sexually Abused: No    Physical Exam      Future Appointments  Date Time Provider Department Center  01/03/2024  2:00 PM [facility] PA/NP [facility] None   02/16/2024  8:00 PM Turner, Cornelious Bryant, MD [facility]

## 2023-12-23 NOTE — Telephone Encounter (Signed)
 H&V Care Navigation CSW Progress Note  Clinical Social Worker spoke with pt regarding housing- pt living with daughter and grandchildren but would be interested in finding housing of her own.  CSW sending list of local senior affordable housing options and [employer] information to apply for project based voucher program.   SDOH Screenings   Food Insecurity: No Food Insecurity (01/13/2023)  Recent Concern: Food Insecurity - Food Insecurity Present (10/26/2022)  Housing: Medium Risk (09/24/2023)  Transportation Needs: Unmet Transportation Needs (10/14/2023)  Utilities: Not At Risk (01/13/2023)  Alcohol Screen: Low Risk  (05/06/2022)  Financial Resource Strain: Low Risk  (03/11/2022)  Tobacco Use: High Risk (09/24/2023)   Burna Sis, LCSW Clinical Social Worker [facility] Desk#: [phone] Cell#: [phone]

## 2023-12-24 ENCOUNTER — Encounter: Payer: Self-pay | Admitting: Pulmonary Disease

## 2023-12-30 ENCOUNTER — Other Ambulatory Visit (HOSPITAL_COMMUNITY): Payer: Self-pay | Admitting: Emergency Medicine

## 2023-12-30 ENCOUNTER — Telehealth (HOSPITAL_COMMUNITY): Payer: Self-pay | Admitting: Licensed Clinical Social Worker

## 2023-12-30 NOTE — Progress Notes (Signed)
 Paramedicine Encounter    Patient ID: Marcus Tran, male    DOB: [birth year], 73 y.o.   MRN: [id number]   Complaints NONE  Assessment A&O x 4, skin W&D w/ good color.  Denies chest pain or SOB. Lung sounds clear bilat and no peripheral edema noted.  Compliance with meds YES  Pill box filled n/a  pt has bubble packs  Refills needed NONE  Meds changes since last visit NONE    Social changes NONE   BP 110/80 (BP Location: Right Arm, Patient Position: Sitting, Cuff Size: Normal)   Pulse 64   Wt 141 lb 3.2 oz (64 kg)   SpO2 97%   BMI 23.50 kg/m  Weight yesterday-not taken Last visit weight-144lb  [name] w/o complaint.  Pt recently received new bubble packs.  This time his Furosemide has been left out of the packs and put in a med bottle.  Pt confirms that he is taking 1.5 tablet (30mg ) of Furosemide from the bottle when he takes his morning bubble pack dose.   Pt has an appointment at [facility] 3/17 @ 2:00.  Msg sent to [facility] requesting assistance w/ transportation.   ACTION: Home visit completed  Bethanie Dicker [phone] 12/30/23  Patient Care Team: Hillery Aldo, NP as PCP - General Rollene Rotunda, MD as PCP - Cardiology (Cardiology) Hunsucker, Lesia Sago, MD as Consulting Physician (Pulmonary Disease)  Patient Active Problem List   Diagnosis Date Noted   Cocaine use 01/15/2023   Noncompliance 01/13/2023   Adenovirus pneumonia 11/12/2022   Chronic HFrEF (heart failure with reduced ejection fraction) (HCC) 08/13/2022   DM2 (diabetes mellitus, type 2) (HCC) 08/13/2022   Obstructive sleep apnea (adult) (pediatric) 08/08/2022   Acute respiratory failure with hypoxia and hypercapnia (HCC) 05/04/2022   Third degree heart block (HCC) 04/15/2022   Snoring 04/15/2022   Nonischemic cardiomyopathy (HCC) 04/15/2022   HFrEF (heart failure with reduced ejection fraction) (HCC) 04/15/2022   COPD with acute exacerbation (HCC) 03/22/2022   Chronic combined  systolic and diastolic CHF (congestive heart failure) (HCC)    Tobacco use disorder 03/08/2022   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 Swallow whole. (Patient not taking: Reported on 12/22/2023), Disp: 90 tablet, Rfl: 3   atorvastatin (LIPITOR) 80 MG tablet, TAKE 1 Tablet BY MOUTH ONCE DAILY EVERY EVENING, Disp: 90 tablet, Rfl: 3   bisoprolol (ZEBETA) 5 MG tablet, Take 1 tablet (5 mg total) by mouth daily., Disp: 90 tablet, Rfl: 3   dapagliflozin propanediol (FARXIGA) 10 MG TABS tablet, Take 1 tablet (10 mg total) by mouth daily., Disp: 30 tablet, Rfl: 11   furosemide (LASIX) 20 MG tablet, Take 1.5 tablets (30 mg total) by mouth daily., Disp: 45 tablet, Rfl: 6   ipratropium-albuterol (DUONEB) 0.5-2.5 (3) MG/3ML SOLN, Inhale 3 mLs into the lungs in the morning, at noon, in the evening, and at bedtime., Disp: 360 mL, Rfl: 11  isosorbide-hydrALAZINE (BIDIL) 20-37.5 MG tablet, TAKE half TABLET BY MOUTH THREE TIMES DAILY (MORNING, noon, evening), Disp: 45 tablet, Rfl: 3   metFORMIN (GLUCOPHAGE) 500 MG tablet, Take 1 tablet (500 mg total) by mouth 2 (two) times daily with a meal., Disp: 60 tablet, Rfl: 0   montelukast (SINGULAIR) 10 MG tablet, TAKE 1 Tablet BY MOUTH ONCE DAILY at bedtime (evening), Disp: 30 tablet, Rfl: 11   sacubitril-valsartan (ENTRESTO) 24-26 MG, Take 1 tablet by mouth 2 (two) times daily., Disp: 60 tablet, Rfl: 11   spironolactone (ALDACTONE) 25 MG  tablet, Take 25 mg by mouth daily. (Patient not taking: Reported on 12/08/2023), Disp: , Rfl:    spironolactone (ALDACTONE) 25 MG tablet, Take 1 tablet (25 mg total) by mouth daily., Disp: 90 tablet, Rfl: 3   TRELEGY ELLIPTA 100-62.5-25 MCG/ACT AEPB, Take 1 puff by mouth daily., Disp: , Rfl:  Allergies  Allergen Reactions   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    Allergic dermatitis/rash     Social History   Socioeconomic History   Marital status: Single    Spouse name: Not on file   Number of children: 1   Years of education: Not on file   Highest education level: 10th grade  Occupational History   Occupation: Disability  Tobacco Use   Smoking status: Some Days    Current packs/day: 0.00    Average packs/day: 0.5 packs/day for 50.0 years (25.0 ttl pk-yrs)    Types: Cigarettes    Start date: 03/19/1972    Last attempt to quit: 03/19/2022    Years since quitting: 1.7   Smokeless tobacco: Never   Tobacco comments:    Smoking cessation  Vaping Use   Vaping status: Never Used  Substance and Sexual Activity   Alcohol use: Not on file    Comment: socially   Drug use: Yes    Types: Marijuana    Comment: past   Sexual activity: Not on file  Other Topics Concern   Not on file  Social History Narrative   He lives with his daughter apparently and 5 grandchildren.  He reports that he smokes probably less than a pack of cigarettes a day.  He occasionally drinks a beer or liquor but not daily.   Social Drivers of Corporate investment banker Strain: Low Risk  (03/11/2022)   Overall Financial Resource Strain (CARDIA)    Difficulty of Paying Living Expenses: Not very hard  Food Insecurity: No Food Insecurity (01/13/2023)   Hunger Vital Sign    Worried About Running Out of Food in the Last Year: Never true    Ran Out of Food in the Last Year: Never true  Recent Concern: Food Insecurity - Food Insecurity Present (10/26/2022)   Hunger Vital Sign    Worried About Programme researcher, broadcasting/film/video in the Last  Year: Often true    Ran Out of Food in the Last Year: Often true  Transportation Needs: Unmet Transportation Needs (12/30/2023)   PRAPARE - Administrator, Civil Service (Medical): Yes    Lack of Transportation (Non-Medical): Yes  Physical Activity: Not on file  Stress: Not on file  Social Connections: Not on file  Intimate Partner Violence: Not At Risk (01/13/2023)   Humiliation, Afraid, Rape, and Kick questionnaire    Fear of Current or Ex-Partner: No    Emotionally Abused: No    Physically Abused: No    Sexually Abused: No    Physical Exam      Future Appointments  Date Time Provider Department Center  01/03/2024  2:00 PM [facility] PA/NP [facility] None  02/16/2024  8:00 PM Turner, Cornelious Bryant, MD [facility]

## 2023-12-30 NOTE — Telephone Encounter (Signed)
 H&V Care Navigation CSW Progress Note  Clinical Social Worker contacted by [employer] to assist with transportation to Mondays appt.  CSW called pt [employer] transportation and arranged ride through them:  Pick up from home: 1:17pm Pick up from clinic visit: 3:15pm  Details provided to pt verbally and by text message  Call [phone] with any concerns- patient encouraged to utilize this benefit to come to future appts.   SDOH Screenings   Food Insecurity: No Food Insecurity (01/13/2023)  Recent Concern: Food Insecurity - Food Insecurity Present (10/26/2022)  Housing: Medium Risk (09/24/2023)  Transportation Needs: Unmet Transportation Needs (10/14/2023)  Utilities: Not At Risk (01/13/2023)  Alcohol Screen: Low Risk  (05/06/2022)  Financial Resource Strain: Low Risk  (03/11/2022)  Tobacco Use: High Risk (09/24/2023)   Burna Sis, LCSW Clinical Social Worker [facility] Desk#: [phone] Cell#: [phone]

## 2023-12-31 NOTE — Progress Notes (Signed)
 ADVANCED HF CLINIC NOTE  PCP: Hillery Aldo, NP [facility] PCP  Primary Cardiologist: Rollene Rotunda, MD  HF Cardiology: Dr. Shirlee Latch   HPI: 73 y.o. [name] with hx tobacco use, COPD, OSA on Bipap, HTN, and chronic HFmrEF.   Admitted 5/23 with acute hypoxic respiratory failure secondary to suspected COPD and acute systolic CHF. Initially in respiratory distress and required BiPAP. BP significantly elevated at 189/133, remained elevated throughout much of admission but improved with titration of GDMT. CTA chest negative for PE. Echo EF 35%, RV okay, RVSP 54 mmHg. R/LHC showed nonobstructive CAD; RA mean 12, PA 54/29 (37), PCWP mean 23, LVEDP 22 mmHg, Fick CO/CI 3.84/2.12. Marcus Tran diuresed with IV lasix.    Marcus Tran was readmitted 6/23 with acute on chronic respiratory failure with hypoxia secondary to acute COPD exacerbation. Also thought to possibly have some component of CHF. Marcus Tran diuresed with IV lasix and received doxycycline + prednisone. Discharge weight 151 lb.    Zio monitor in 6/23 showed NSR with short runs NSVT and SVT, bradyarrhythmias, idioventricular rhythm, with episodes of third-degree HB noted during sleep hours.  Bradycardia thought to be due to untreated severe OSA.   Admitted 7/23 with acute respiratory failure with hypoxia. O2 sats 80s. Marcus Tran initially required BiPAP. Hypertensive with BP 210/120 and placed on nitro gtt. Marcus Tran was admitted for AECOPD and CHF. Diuresed with IV lasix. Cardiology consulted and felt presentation more likely consistent with AECOPD rather than CHF. Had runs of NSVT up to 20 beats in duration.   Echo 8/23 EF improved 40-45% with LVH. Recommended PYP/ CMRI  Sleep study 9/23 showed severe OSA with AHI of 42.6/hr. Marcus Tran declined in lab BiPap titration.  ED evaluation 10/16/22 for syncope. Low suspicion for PE. CXR ok. COVID negative. K 5.5. Suspected vasovagal episode during coughing fit.    Spironolactone/KCL stopped with recent hyperkalemia.   Admitted 1/24 for AECOPD.  Transiently required BiPap, then able to wean to nasal cannula. Found to have AKI and GDMT initially held then restarted per home regimen. Started on prednisone taper. Marcus Tran was discharged home, weight 155 lbs.  Admitted 3/24 with COPD exacerbation. Required BiPap and steroids. Started on IV Solu-Medrol. UDS + cocaine. Cleda Daub and Sherryll Burger stopped with AKI.    Had PYP scan 04/09/23 --> [place] suggestive of TTR. Marcus Tran was started on tafamidis but developed severe rash/dermatitis after starting that improved when Marcus Tran stopped and came back when Marcus Tran restarted. Marcus Tran is now off tafamidis.   Echo 10/24 showed EF 40% with moderate LVH, global hypokinesis, mild RV enlargement with mildly decreased systolic function, IVC normal.   Seen in UC s/p fall 09/14/23. Underwent R distal thumb reduction.  cMRI 12/24 LVEF 40%, RVEF 46%, elevated ECV consistent with cardiac amyloidosis however LGE pattern not classic, consider cardiac sarcoidosis.  Today Marcus Tran returns for HF follow up. Overall feeling fine. No SOB walking on flat ground or with ADLs. Denies palpitations, abnormal bleeding, CP, dizziness, edema, or PND/Orthopnea. Appetite ok. No fever or chills. Weight at home 143-144 pounds. Taking all medications. Not wearing Bipap. Smoking 5 cigarettes/day, no ETOH or drugs. Followed by Paramedicine.  Labs (7/24): urine immunofixation negative, myeloma panel negative Labs (9/24): BNP 553, K 3.5, creatinine 1.75 Labs (10/24): K 4.1, creatinine 1.62 Labs (1/25): K 4.2, creatinine 3.9, LDL 55  ECG (personally reviewed): SB 56 bpm, rBBB  PMH: 1. OSA: Severe. CPAP. 2. COPD 3. HTN 4. 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  - R/LHC (5/23):  nonobstructive CAD; RA mean 12, PA 54/29 (37), PCWP mean 23, LVEDP 22 mmHg, Fick CO/CI 3.84/2.12. - Echo (5/23): EF 35%, RV okay, RVSP 54 mmHg. - Echo (8/23): EF 40-45% w/ LVH - Echo (10/24): EF 40% with moderate LVH, global hypokinesis,  mild RV enlargement with mildly decreased systolic function, IVC normal.  - PYP scan (6/24): Grade 2, H/CL 1.3.  Looks abnormal visually.   - Invitae gene testing negative for TTR mutations.  - cMRI 12/24 LVEF 40%, RVEF 46%, elevated ECV consistent with cardiac amyloidosis however LGE pattern not classic, consider cardiac sarcoidosis. 5. Hyperlipidemia 6. Bradycardia: Complete HB noted while sleeping, thought to be due to severe OSA.  7. CAD: LHC (5/23) with 50% OM1 stenosis.  8. Syncope: ?vasovagal 9. Type 2 diabetes.   Current Outpatient Medications  Medication Sig Dispense Refill   Acoramidis, 712MG  Twice Daily, (ATTRUBY) 356 MG TBPK Take 712 mg by mouth 2 (two) times daily. 112 each 11   albuterol (VENTOLIN HFA) 108 (90 Base) MCG/ACT inhaler Inhale 2 puffs into the lungs every 4 (four) hours as needed for wheezing or shortness of breath. 1 each 2   atorvastatin (LIPITOR) 80 MG tablet TAKE 1 Tablet BY MOUTH ONCE DAILY EVERY EVENING 90 tablet 3   bisoprolol (ZEBETA) 5 MG tablet Take 1 tablet (5 mg total) by mouth daily. 90 tablet 3   dapagliflozin propanediol (FARXIGA) 10 MG TABS tablet Take 1 tablet (10 mg total) by mouth daily. 30 tablet 11   furosemide (LASIX) 20 MG tablet Take 1.5 tablets (30 mg total) by mouth daily. 45 tablet 6   ipratropium-albuterol (DUONEB) 0.5-2.5 (3) MG/3ML SOLN Inhale 3 mLs into the lungs in the morning, at noon, in the evening, and at bedtime. (Patient taking differently: Inhale 3 mLs into the lungs in the morning, at noon, in the evening, and at bedtime. As needed) 360 mL 11   isosorbide-hydrALAZINE (BIDIL) 20-37.5 MG tablet TAKE half TABLET BY MOUTH THREE TIMES DAILY (MORNING, noon, evening) 45 tablet 3   metFORMIN (GLUCOPHAGE) 500 MG tablet Take 1 tablet (500 mg total) by mouth 2 (two) times daily with a meal. 60 tablet 0   montelukast (SINGULAIR) 10 MG tablet TAKE 1 Tablet BY MOUTH ONCE DAILY at bedtime (evening) 30 tablet 11   sacubitril-valsartan (ENTRESTO)  24-26 MG Take 1 tablet by mouth 2 (two) times daily. 60 tablet 11   spironolactone (ALDACTONE) 25 MG tablet Take 1 tablet (25 mg total) by mouth daily. 90 tablet 3   TRELEGY ELLIPTA 100-62.5-25 MCG/ACT AEPB Take 1 puff by mouth daily.     No current facility-administered medications for this encounter.   Allergies  Allergen Reactions   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    Allergic dermatitis/rash    Social History   Socioeconomic History   Marital status: Single    Spouse name: Not on file   Number of children: 1   Years of education: Not on file   Highest education level: 10th grade  Occupational History   Occupation: Disability  Tobacco Use   Smoking status: Some Days    Current packs/day: 0.00    Average packs/day: 0.5 packs/day for 50.0 years (25.0 ttl pk-yrs)    Types: Cigarettes    Start date: 03/19/1972    Last attempt to quit: 03/19/2022    Years since quitting: 1.7   Smokeless tobacco: Never   Tobacco comments:    Smoking cessation  Vaping Use   Vaping status: Never Used  Substance and Sexual Activity  Alcohol use: Not on file    Comment: socially   Drug use: Yes    Types: Marijuana    Comment: past   Sexual activity: Not on file  Other Topics Concern   Not on file  Social History Narrative   Marcus Tran lives with Marcus Tran daughter apparently and 5 grandchildren.  Marcus Tran reports that Marcus Tran smokes probably less than a pack of cigarettes a day.  Marcus Tran occasionally drinks a beer or liquor but not daily.   Social Drivers of Corporate investment banker Strain: Low Risk  (03/11/2022)   Overall Financial Resource Strain (CARDIA)    Difficulty of Paying Living Expenses: Not very hard  Food Insecurity: No Food Insecurity (01/13/2023)   Hunger Vital Sign    Worried About Running Out of Food in the Last Year: Never true    Ran Out of Food in the Last Year: Never true  Recent Concern: Food Insecurity - Food Insecurity Present (10/26/2022)   Hunger Vital Sign    Worried About Brewing technologist in the Last Year: Often true    Ran Out of Food in the Last Year: Often true  Transportation Needs: Unmet Transportation Needs (12/30/2023)   PRAPARE - Administrator, Civil Service (Medical): Yes    Lack of Transportation (Non-Medical): Yes  Physical Activity: Not on file  Stress: Not on file  Social Connections: Not on file  Intimate Partner Violence: Not At Risk (01/13/2023)   Humiliation, Afraid, Rape, and Kick questionnaire    Fear of Current or Ex-Partner: No    Emotionally Abused: No    Physically Abused: No    Sexually Abused: No   Family History  Problem Relation Age of Onset   Hypertension Mother    BP 102/62 (BP Location: Left Arm, Patient Position: Sitting)   Pulse 61   Wt 64.9 kg (143 lb)   SpO2 96%   BMI 23.80 kg/m   Wt Readings from Last 3 Encounters:  01/03/24 64.9 kg (143 lb)  12/30/23 64 kg (141 lb 3.2 oz)  12/23/23 65.3 kg (144 lb)   PHYSICAL EXAM: General:  NAD. No resp difficulty, walked into clinic HEENT: Normal Neck: Supple. No JVD. Cor: Regular rate & rhythm. No rubs, gallops or murmurs. Lungs: Clear Abdomen: Soft, nontender, nondistended.  Extremities: No cyanosis, clubbing, rash, edema Neuro: Alert & oriented x 3, moves all 4 extremities w/o difficulty. Affect pleasant.  ASSESSMENT & PLAN:  1.  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 Echo (5/23) with LVEF 35%, RV okay.  [facility] 5/23 showed nonobstructive CAD; RA mean 12, PA 54/29 (37), PCWP mean 23, LVEDP 22 mmHg. Echo in 8/23 with EF 40-45%.  Echo in 10/24 showed EF 40% with moderate LVH, global hypokinesis, mild RV enlargement with mildly decreased systolic function, IVC normal. PYP scan concerning for TTR cardiac amyloidosis.  Myeloma studies were negative.  Invitae gene testing negative for TTR gene mutations.  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  Marcus Tran did not tolerate tafamidis due to severe rash. NYHA class II, not  particularly symptomatic currently and not volume overloaded on exam.  - The PYP scan was not read as floridly positive, but visually appears positive. cMRI 12/24 LVEF 40%, RVEF 46%, elevated ECV consistent with cardiac amyloidosis however LGE pattern not classic, consider cardiac sarcoidosis. - Will arrange high resolution CT chest to look for pulmonary sarcoidosis, ACE level and arrange cardiac PET. - Marcus Tran is now on acoramidis. -  Continue spironolactone 25 mg daily. BMET/BNP today. - Continue Entresto 24/26 mg bid.  - Continue Lasix 30 mg daily. - Continue Farxiga 10 mg daily. No GU symptoms. - Continue bisoprolol 5 mg daily, low threshold to cut back with history of bradycardia. Needs BiPap. - Continue BiDil 0.5 tab tid. 2. Syncope:  Multiple recent episodes in the past. Evaluated in the ED 12/23. Event felt to by vasovagal.  2 week Zio (6/23) showed mostly NSR, significant bradycardia during sleeping hours w/ intermittent CHB thought to be due to severe OSA. No recent recurrence.  - Low threshold to cut back on bisoprolol.  - Needs BiPap.  3. HTN: BP stable - Continue current meds. 4. OSA: Severe by sleep study.   - Needs to use Bipap. Has titration study next month. 5. CAD: Nonobstructive on [facility] 5/23.  No chest pain.  - Continue aspirin and statin. 6. COPD: Still smoking. Has history of COPD exacerbations though minimally symptomatic at this time.  - Discussed smoking cessation.  - On Trelegy. Followed by Pulmonary  Continue paramedicine.  Follow up in 3 months with Dr. Park Liter, FNP-BC 01/03/24

## 2024-01-03 ENCOUNTER — Encounter (HOSPITAL_COMMUNITY): Payer: Self-pay

## 2024-01-03 ENCOUNTER — Ambulatory Visit (HOSPITAL_COMMUNITY)
Admission: RE | Admit: 2024-01-03 | Discharge: 2024-01-03 | Disposition: A | Discharge status: Home / Self Care | Payer: 59 | Source: Ambulatory Visit | Attending: Family Medicine | Admitting: Family Medicine

## 2024-01-03 VITALS — BP 102/62 | HR 61 | Wt 143.0 lb

## 2024-01-03 DIAGNOSIS — I4510 Unspecified right bundle-branch block: Secondary | ICD-10-CM | POA: Insufficient documentation

## 2024-01-03 DIAGNOSIS — G4733 Obstructive sleep apnea (adult) (pediatric): Secondary | ICD-10-CM

## 2024-01-03 DIAGNOSIS — I5022 Chronic systolic (congestive) heart failure: Secondary | ICD-10-CM | POA: Diagnosis not present

## 2024-01-03 DIAGNOSIS — I110 Hypertensive heart disease with heart failure: Secondary | ICD-10-CM | POA: Diagnosis not present

## 2024-01-03 DIAGNOSIS — Z720 Tobacco use: Secondary | ICD-10-CM

## 2024-01-03 DIAGNOSIS — R55 Syncope and collapse: Secondary | ICD-10-CM | POA: Diagnosis not present

## 2024-01-03 DIAGNOSIS — Z87898 Personal history of other specified conditions: Secondary | ICD-10-CM | POA: Diagnosis not present

## 2024-01-03 DIAGNOSIS — J449 Chronic obstructive pulmonary disease, unspecified: Secondary | ICD-10-CM | POA: Diagnosis not present

## 2024-01-03 DIAGNOSIS — I2510 Atherosclerotic heart disease of native coronary artery without angina pectoris: Secondary | ICD-10-CM | POA: Diagnosis not present

## 2024-01-03 DIAGNOSIS — F17210 Nicotine dependence, cigarettes, uncomplicated: Secondary | ICD-10-CM | POA: Insufficient documentation

## 2024-01-03 DIAGNOSIS — Z79899 Other long term (current) drug therapy: Secondary | ICD-10-CM | POA: Diagnosis not present

## 2024-01-03 DIAGNOSIS — E119 Type 2 diabetes mellitus without complications: Secondary | ICD-10-CM | POA: Insufficient documentation

## 2024-01-03 DIAGNOSIS — I10 Essential (primary) hypertension: Secondary | ICD-10-CM

## 2024-01-03 DIAGNOSIS — I428 Other cardiomyopathies: Secondary | ICD-10-CM | POA: Insufficient documentation

## 2024-01-03 DIAGNOSIS — Z7984 Long term (current) use of oral hypoglycemic drugs: Secondary | ICD-10-CM | POA: Insufficient documentation

## 2024-01-03 LAB — BASIC METABOLIC PANEL
Anion gap: 10 (ref 5–15)
BUN: 34 mg/dL — ABNORMAL HIGH (ref 8–23)
CO2: 20 mmol/L — ABNORMAL LOW (ref 22–32)
Calcium: 9.8 mg/dL (ref 8.9–10.3)
Chloride: 99 mmol/L (ref 98–111)
Creatinine, Ser: 2.24 mg/dL — ABNORMAL HIGH (ref 0.61–1.24)
GFR, Estimated: 30 mL/min — ABNORMAL LOW (ref >60)
Glucose, Bld: 99 mg/dL (ref 70–99)
Potassium: 4.9 mmol/L (ref 3.5–5.1)
Sodium: 129 mmol/L — ABNORMAL LOW (ref 135–145)

## 2024-01-03 LAB — BRAIN NATRIURETIC PEPTIDE: B Natriuretic Peptide: 70.7 pg/mL (ref 0.0–100.0)

## 2024-01-03 NOTE — Patient Instructions (Addendum)
 Thank you for coming in today  If you had labs drawn today, any labs that are abnormal the clinic will call you No news is good news  EKG was done today   Medications: No changes  Follow up appointments:  Your physician recommends that you schedule a follow-up appointment in:  3 months With Dr. Shirlee Latch    Do the following things EVERYDAY: Weigh yourself in the morning before breakfast. Write it down and keep it in a log. Take your medicines as prescribed Eat low salt foods--Limit salt (sodium) to 2000 mg per day.  Stay as active as you can everyday Limit all fluids for the day to less than 2 liters   At the [facility], you and your health needs are our priority. As part of our continuing mission to provide you with exceptional heart care, we have created designated Provider Care Teams. These Care Teams include your primary Cardiologist (physician) and Advanced Practice Providers (APPs- Physician Assistants and Nurse Practitioners) who all work together to provide you with the care you need, when you need it.   You may see any of the following providers on your designated Care Team at your next follow up: Dr Arvilla Meres Dr Marca Ancona Dr. Marcos Eke, NP Robbie Lis, [place] [facility] [place], [place] Brynda Peon, NP Karle Plumber, PharmD   Please be sure to bring in all your medications bottles to every appointment.    Thank you for choosing [facility]  If you have any questions or concerns before your next appointment please send [place] a message through [place] or call our office at [phone].    TO LEAVE A MESSAGE FOR THE NURSE SELECT OPTION 2, PLEASE LEAVE A MESSAGE INCLUDING: YOUR NAME DATE OF BIRTH CALL BACK NUMBER REASON FOR CALL**this is important as we prioritize the call backs  YOU WILL RECEIVE A CALL BACK THE SAME DAY AS LONG AS YOU CALL BEFORE 4:00 PM

## 2024-01-04 ENCOUNTER — Other Ambulatory Visit (HOSPITAL_COMMUNITY): Payer: Self-pay | Admitting: Emergency Medicine

## 2024-01-05 NOTE — Progress Notes (Signed)
 Today's visit for med reconciliation only.  Deconstructed bubble packs to reflect med changes from clinic visit 3/17. Med change set up to start 3/19  Removed Entresto, Farxiga, and Cleda Daub and lasix x 3 day.  On 3/22 Clifton Custard and Cleda Daub are added back with 20mg  Lasix Mon/Wed/Fri.   Repeat Lab appt set up fro 3/25 @ 12:00.  Pt aware of same. Will message Eileen Stanford for transportation needs.    Beatrix Shipper, EMT-Paramedic [phone] 01/05/2024

## 2024-01-06 ENCOUNTER — Other Ambulatory Visit: Payer: Self-pay

## 2024-01-06 ENCOUNTER — Telehealth (HOSPITAL_COMMUNITY): Payer: Self-pay | Admitting: Licensed Clinical Social Worker

## 2024-01-06 NOTE — Telephone Encounter (Signed)
 H&V Care Navigation CSW Progress Note  Clinical Social Worker consulted to assist pt with transportation to lab appt next week.  Pt has rides available through his [facility] insurance so CSW called pt to discuss and educate on how to arrange transportation through this benefit.  Pt and CSW called [employer] together at [phone] and spoke with representative to set up ride.  Patient participated and expressed understanding.  Ride will pick pt up 3/25 at 11:18am for appt- patient to call transport when his lab is complete for ride home- pt confirmed he had the number written down and could do this.  Put number for transport in appt notes in case pt misplaces or needs assistance from clinic staff in doing so.  Paramedic informed of above  SDOH Screenings   Food Insecurity: No Food Insecurity (01/13/2023)  Recent Concern: Food Insecurity - Food Insecurity Present (10/26/2022)  Housing: Medium Risk (09/24/2023)  Transportation Needs: Unmet Transportation Needs (01/06/2024)  Utilities: Not At Risk (01/13/2023)  Alcohol Screen: Low Risk  (05/06/2022)  Financial Resource Strain: Low Risk  (03/11/2022)  Tobacco Use: High Risk (01/03/2024)    Burna Sis, LCSW Clinical Social Worker [facility] Desk#: [phone] Cell#: [phone]

## 2024-01-11 ENCOUNTER — Telehealth (HOSPITAL_COMMUNITY): Payer: Self-pay | Admitting: Emergency Medicine

## 2024-01-11 ENCOUNTER — Ambulatory Visit (HOSPITAL_COMMUNITY)
Admission: RE | Admit: 2024-01-11 | Discharge: 2024-01-11 | Disposition: A | Discharge status: Home / Self Care | Source: Ambulatory Visit | Attending: Cardiology | Admitting: Cardiology

## 2024-01-11 ENCOUNTER — Telehealth (HOSPITAL_COMMUNITY): Payer: Self-pay | Admitting: Cardiology

## 2024-01-11 DIAGNOSIS — I5042 Chronic combined systolic (congestive) and diastolic (congestive) heart failure: Secondary | ICD-10-CM | POA: Insufficient documentation

## 2024-01-11 LAB — BASIC METABOLIC PANEL
Anion gap: 3 — ABNORMAL LOW (ref 5–15)
BUN: 23 mg/dL (ref 8–23)
CO2: 24 mmol/L (ref 22–32)
Calcium: 9.1 mg/dL (ref 8.9–10.3)
Chloride: 105 mmol/L (ref 98–111)
Creatinine, Ser: 1.85 mg/dL — ABNORMAL HIGH (ref 0.61–1.24)
GFR, Estimated: 38 mL/min — ABNORMAL LOW (ref >60)
Glucose, Bld: 95 mg/dL (ref 70–99)
Potassium: 4.4 mmol/L (ref 3.5–5.1)
Sodium: 132 mmol/L — ABNORMAL LOW (ref 135–145)

## 2024-01-11 NOTE — Telephone Encounter (Signed)
 Need [employer]

## 2024-01-11 NOTE — Telephone Encounter (Signed)
 Called pt to remind him of his lab appointment today.  Reminded him transportation will pick him up @ 11:18  Pt. Advised he would be ready for same.    Beatrix Shipper, EMT-Paramedic [phone] 01/11/2024

## 2024-01-12 ENCOUNTER — Other Ambulatory Visit (HOSPITAL_COMMUNITY): Payer: Self-pay | Admitting: Emergency Medicine

## 2024-01-12 ENCOUNTER — Other Ambulatory Visit: Payer: Self-pay

## 2024-01-12 NOTE — Progress Notes (Signed)
 Paramedicine Encounter    Patient ID: Marcus Tran, male    DOB: 08/06/1951, 73 y.o.   MRN: [id number]   Complaints NONE  Assessment A&O x 4, skin W&D w/ good color.  Denies chest pain or SOB. Lung sounds clear and equal bilat and no peripheral edema noted.  Compliance with meds YES  Pill box filled- now back to working out of bubble packs  Refills needed- needs new prescription for Lasix 20mg .  1 tablet daily Mon/Wed/Fri.  Meds changes since last visit Decreased Lasix dose    Social changes NONE   BP 110/80 (BP Location: Right Arm, Patient Position: Sitting, Cuff Size: Normal)   Pulse 76   Resp 14   Wt 147 lb (66.7 kg)   SpO2 99%   BMI 24.46 kg/m  Weight yesterday- not taken Last visit weight-141lb  [name] outside cleaning up the yard.  He states he feels good and has been outside for a few hours "piddling around outside."  He denies chest pain or SOB.  Lung sounds clear throughout.  No pedal edema noted.  He has done well with med compliance.  He is now back to taking meds out of his bubble packs and I instructed him to take 20mg  Furosemide on M/W/F only and he verbalizes that he understands same.   He has been taking his Furosemide out of a bottle because is doses keep fluctuating up and down.    ACTION: Home visit completed  Bethanie Dicker [phone] 01/12/24  Patient Care Team: Hillery Aldo, NP as PCP - General Rollene Rotunda, MD as PCP - Cardiology (Cardiology) Hunsucker, Lesia Sago, MD as Consulting Physician (Pulmonary Disease)  Patient Active Problem List   Diagnosis Date Noted   Cocaine use 01/15/2023   Noncompliance 01/13/2023   Adenovirus pneumonia 11/12/2022   Chronic HFrEF (heart failure with reduced ejection fraction) (HCC) 08/13/2022   DM2 (diabetes mellitus, type 2) (HCC) 08/13/2022   Obstructive sleep apnea (adult) (pediatric) 08/08/2022   Acute respiratory failure with hypoxia and hypercapnia (HCC) 05/04/2022   Third degree  heart block (HCC) 04/15/2022   Snoring 04/15/2022   Nonischemic cardiomyopathy (HCC) 04/15/2022   HFrEF (heart failure with reduced ejection fraction) (HCC) 04/15/2022   COPD with acute exacerbation (HCC) 03/22/2022   Chronic combined systolic and diastolic CHF (congestive heart failure) (HCC)    Tobacco use disorder 03/08/2022   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   HTN (hypertension) 03/08/2022   Stage 3a chronic kidney disease (CKD) (HCC) 03/08/2022   Coronary artery disease 03/08/2022    Current Outpatient Medications:    Acoramidis, 712MG  Twice Daily, (ATTRUBY) 356 MG TBPK, Take 712 mg by mouth 2 (two) times daily., Disp: 112 each, Rfl: 11   albuterol (VENTOLIN HFA) 108 (90 Base) MCG/ACT inhaler, Inhale 2 puffs into the lungs every 4 (four) hours as needed for wheezing or shortness of breath., Disp: 1 each, Rfl: 2   atorvastatin (LIPITOR) 80 MG tablet, TAKE 1 Tablet BY MOUTH ONCE DAILY EVERY EVENING, Disp: 90 tablet, Rfl: 3   bisoprolol (ZEBETA) 5 MG tablet, Take 1 tablet (5 mg total) by mouth daily., Disp: 90 tablet, Rfl: 3   dapagliflozin propanediol (FARXIGA) 10 MG TABS tablet, Take 1 tablet (10 mg total) by mouth daily., Disp: 30 tablet, Rfl: 11   furosemide (LASIX) 20 MG tablet, Take 1.5 tablets (30 mg total) by mouth daily., Disp: 45 tablet, Rfl: 6   ipratropium-albuterol (DUONEB) 0.5-2.5 (3) MG/3ML SOLN, Inhale 3 mLs into the  lungs in the morning, at noon, in the evening, and at bedtime. (Patient taking differently: Inhale 3 mLs into the lungs in the morning, at noon, in the evening, and at bedtime. As needed), Disp: 360 mL, Rfl: 11   isosorbide-hydrALAZINE (BIDIL) 20-37.5 MG tablet, TAKE half TABLET BY MOUTH THREE TIMES DAILY (MORNING, noon, evening), Disp: 45 tablet, Rfl: 3   metFORMIN (GLUCOPHAGE) 500 MG tablet, Take 1 tablet (500 mg total) by mouth 2 (two) times daily with a meal., Disp: 60 tablet, Rfl: 0   montelukast (SINGULAIR) 10 MG tablet, TAKE 1 Tablet BY MOUTH  ONCE DAILY at bedtime (evening), Disp: 30 tablet, Rfl: 11   sacubitril-valsartan (ENTRESTO) 24-26 MG, Take 1 tablet by mouth 2 (two) times daily., Disp: 60 tablet, Rfl: 11   spironolactone (ALDACTONE) 25 MG tablet, Take 1 tablet (25 mg total) by mouth daily., Disp: 90 tablet, Rfl: 3   TRELEGY ELLIPTA 100-62.5-25 MCG/ACT AEPB, Take 1 puff by mouth daily., Disp: , Rfl:  Allergies  Allergen Reactions   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    Allergic dermatitis/rash     Social History   Socioeconomic History   Marital status: Single    Spouse name: Not on file   Number of children: 1   Years of education: Not on file   Highest education level: 10th grade  Occupational History   Occupation: Disability  Tobacco Use   Smoking status: Some Days    Current packs/day: 0.00    Average packs/day: 0.5 packs/day for 50.0 years (25.0 ttl pk-yrs)    Types: Cigarettes    Start date: 03/19/1972    Last attempt to quit: 03/19/2022    Years since quitting: 1.8   Smokeless tobacco: Never   Tobacco comments:    Smoking cessation  Vaping Use   Vaping status: Never Used  Substance and Sexual Activity   Alcohol use: Not on file    Comment: socially   Drug use: Yes    Types: Marijuana    Comment: past   Sexual activity: Not on file  Other Topics Concern   Not on file  Social History Narrative   He lives with his daughter apparently and 5 grandchildren.  He reports that he smokes probably less than a pack of cigarettes a day.  He occasionally drinks a beer or liquor but not daily.   Social Drivers of Corporate investment banker Strain: Low Risk  (03/11/2022)   Overall Financial Resource Strain (CARDIA)    Difficulty of Paying Living Expenses: Not very hard  Food Insecurity: No Food Insecurity (01/13/2023)   Hunger Vital Sign    Worried About Running Out of Food in the Last Year: Never true    Ran Out of Food in the Last Year: Never true  Recent Concern: Food Insecurity - Food Insecurity Present  (10/26/2022)   Hunger Vital Sign    Worried About Programme researcher, broadcasting/film/video in the Last Year: Often true    Ran Out of Food in the Last Year: Often true  Transportation Needs: Unmet Transportation Needs (01/06/2024)   PRAPARE - Administrator, Civil Service (Medical): Yes    Lack of Transportation (Non-Medical): Yes  Physical Activity: Not on file  Stress: Not on file  Social Connections: Not on file  Intimate Partner Violence: Not At Risk (01/13/2023)   Humiliation, Afraid, Rape, and Kick questionnaire    Fear of Current or Ex-Partner: No    Emotionally Abused: No    Physically Abused: No  Sexually Abused: No    Physical Exam      Future Appointments  Date Time Provider Department Center  02/16/2024  8:00 PM Quintella Reichert, MD [facility]  03/30/2024  2:40 PM Laurey Morale, MD [facility] None

## 2024-01-14 ENCOUNTER — Other Ambulatory Visit: Payer: Self-pay

## 2024-01-14 ENCOUNTER — Other Ambulatory Visit (HOSPITAL_COMMUNITY): Payer: Self-pay | Admitting: Cardiology

## 2024-01-14 DIAGNOSIS — I5020 Unspecified systolic (congestive) heart failure: Secondary | ICD-10-CM

## 2024-01-14 NOTE — Telephone Encounter (Signed)
 This is a CHF pt

## 2024-01-17 ENCOUNTER — Other Ambulatory Visit (HOSPITAL_COMMUNITY): Payer: Self-pay

## 2024-01-17 ENCOUNTER — Other Ambulatory Visit (HOSPITAL_COMMUNITY)

## 2024-01-17 ENCOUNTER — Other Ambulatory Visit: Payer: Self-pay

## 2024-01-17 NOTE — Progress Notes (Signed)
 Specialty Pharmacy Refill Coordination Note  Marcus Tran is a 73 y.o. male contacted today regarding refills of specialty medication(s) Acoramidis HCl [name])   Patient requested Delivery   Delivery date: 01/20/24   Verified address: [address]   [place] [address]   Medication will be filled on 01/19/24.

## 2024-01-19 ENCOUNTER — Other Ambulatory Visit: Payer: Self-pay

## 2024-01-19 ENCOUNTER — Other Ambulatory Visit (HOSPITAL_COMMUNITY): Payer: Self-pay | Admitting: Emergency Medicine

## 2024-01-19 NOTE — Progress Notes (Signed)
 Paramedicine Encounter    Patient ID: Marcus Tran, male    DOB: Jun 19, 1951, 73 y.o.   MRN: [id number]   Complaints NONE  Assessment A&O x 4, skin W&D w/ good color.  Pt denies chest pain or SOB.  Lung sounds clear and equal bilat and no peripheral edema noted.   Doing well with med compliance.  Compliance with meds YES  Pill box filled n/a - bubble packs  Refills needed NONE- just received new bubble packs  Meds changes since last visit NONE    Social changes NONE   BP 120/80 (BP Location: Right Arm, Patient Position: Sitting, Cuff Size: Normal)   Pulse 73   Resp 16   Wt 155 lb (70.3 kg)   SpO2 98%   BMI 25.79 kg/m  Weight yesterday- not taken  Last visit weight-152lb  [name] had just arrived home from the grocery store.  A&O x 4, skin W&D w/ good color.  Denies chest pain or SOB.  Lung sounds clear and equal throughout.  His weight is up 5lbs but looks good overall and has been eating well.  Advised him to listen to his body regarding the weight gain to be aware of increased SOB and/or chest pain.  He advised he would do so.  ACTION: Home visit completed  Bethanie Dicker [phone] 01/19/24  Patient Care Team: Hillery Aldo, NP as PCP - General Rollene Rotunda, MD as PCP - Cardiology (Cardiology) Hunsucker, Lesia Sago, MD as Consulting Physician (Pulmonary Disease)  Patient Active Problem List   Diagnosis Date Noted   Cocaine use 01/15/2023   Noncompliance 01/13/2023   Adenovirus pneumonia 11/12/2022   Chronic HFrEF (heart failure with reduced ejection fraction) (HCC) 08/13/2022   DM2 (diabetes mellitus, type 2) (HCC) 08/13/2022   Obstructive sleep apnea (adult) (pediatric) 08/08/2022   Acute respiratory failure with hypoxia and hypercapnia (HCC) 05/04/2022   Third degree heart block (HCC) 04/15/2022   Snoring 04/15/2022   Nonischemic cardiomyopathy (HCC) 04/15/2022   HFrEF (heart failure with reduced ejection fraction) (HCC) 04/15/2022    COPD with acute exacerbation (HCC) 03/22/2022   Chronic combined systolic and diastolic CHF (congestive heart failure) (HCC)    Tobacco use disorder 03/08/2022   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   HTN (hypertension) 03/08/2022   Stage 3a chronic kidney disease (CKD) (HCC) 03/08/2022   Coronary artery disease 03/08/2022    Current Outpatient Medications:    Acoramidis, 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  Twice Daily, (ATTRUBY) 356 MG TBPK, Take 712 mg by mouth 2 (two) times daily., Disp: 112 each, Rfl: 11   albuterol (VENTOLIN HFA) 108 (90 Base) MCG/ACT inhaler, Inhale 2 puffs into the lungs every 4 (four) hours as needed for wheezing or shortness of breath., Disp: 1 each, Rfl: 2   atorvastatin (LIPITOR) 80 MG tablet, TAKE 1 Tablet BY MOUTH ONCE DAILY EVERY EVENING, Disp: 90 tablet, Rfl: 3   bisoprolol (ZEBETA) 5 MG tablet, Take 1 tablet (5 mg total) by mouth daily., Disp: 90 tablet, Rfl: 3   dapagliflozin propanediol (FARXIGA) 10 MG TABS tablet, Take 1 tablet (10 mg total) by mouth daily., Disp: 30 tablet, Rfl: 11   furosemide (LASIX) 20 MG tablet, Take 1.5 tablets (30 mg total) by mouth daily., Disp: 45 tablet, Rfl: 6   ipratropium-albuterol (DUONEB) 0.5-2.5 (3) MG/3ML SOLN, Inhale 3 mLs into the lungs in the morning, at noon, in the evening, and at bedtime. (Patient taking differently: Inhale 3 mLs into the lungs in the morning, at noon, in the  evening, and at bedtime. As needed), Disp: 360 mL, Rfl: 11   isosorbide-hydrALAZINE (BIDIL) 20-37.5 MG tablet, TAKE half TABLET BY MOUTH THREE TIMES DAILY (MORNING, noon, evening), Disp: 45 tablet, Rfl: 5   metFORMIN (GLUCOPHAGE) 500 MG tablet, Take 1 tablet (500 mg total) by mouth 2 (two) times daily with a meal., Disp: 60 tablet, Rfl: 0   montelukast (SINGULAIR) 10 MG tablet, TAKE 1 Tablet BY MOUTH ONCE DAILY at bedtime (evening), Disp: 30 tablet, Rfl: 11   sacubitril-valsartan (ENTRESTO) 24-26 MG, Take 1 tablet by mouth 2 (two) times daily., Disp: 60 tablet, Rfl:  11   spironolactone (ALDACTONE) 25 MG tablet, Take 1 tablet (25 mg total) by mouth daily., Disp: 90 tablet, Rfl: 3   TRELEGY ELLIPTA 100-62.5-25 MCG/ACT AEPB, Take 1 puff by mouth daily., Disp: , Rfl:  Allergies  Allergen Reactions   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    Allergic dermatitis/rash     Social History   Socioeconomic History   Marital status: Single    Spouse name: Not on file   Number of children: 1   Years of education: Not on file   Highest education level: 10th grade  Occupational History   Occupation: Disability  Tobacco Use   Smoking status: Some Days    Current packs/day: 0.00    Average packs/day: 0.5 packs/day for 50.0 years (25.0 ttl pk-yrs)    Types: Cigarettes    Start date: 03/19/1972    Last attempt to quit: 03/19/2022    Years since quitting: 1.8   Smokeless tobacco: Never   Tobacco comments:    Smoking cessation  Vaping Use   Vaping status: Never Used  Substance and Sexual Activity   Alcohol use: Not on file    Comment: socially   Drug use: Yes    Types: Marijuana    Comment: past   Sexual activity: Not on file  Other Topics Concern   Not on file  Social History Narrative   He lives with his daughter apparently and 5 grandchildren.  He reports that he smokes probably less than a pack of cigarettes a day.  He occasionally drinks a beer or liquor but not daily.   Social Drivers of Corporate investment banker Strain: Low Risk  (03/11/2022)   Overall Financial Resource Strain (CARDIA)    Difficulty of Paying Living Expenses: Not very hard  Food Insecurity: No Food Insecurity (01/13/2023)   Hunger Vital Sign    Worried About Running Out of Food in the Last Year: Never true    Ran Out of Food in the Last Year: Never true  Recent Concern: Food Insecurity - Food Insecurity Present (10/26/2022)   Hunger Vital Sign    Worried About Programme researcher, broadcasting/film/video in the Last Year: Often true    Ran Out of Food in the Last Year: Often true  Transportation Needs:  Unmet Transportation Needs (01/06/2024)   PRAPARE - Administrator, Civil Service (Medical): Yes    Lack of Transportation (Non-Medical): Yes  Physical Activity: Not on file  Stress: Not on file  Social Connections: Not on file  Intimate Partner Violence: Not At Risk (01/13/2023)   Humiliation, Afraid, Rape, and Kick questionnaire    Fear of Current or Ex-Partner: No    Emotionally Abused: No    Physically Abused: No    Sexually Abused: No    Physical Exam      Future Appointments  Date Time Provider Department Center  02/16/2024  8:00  PM Quintella Reichert, MD [facility]  03/30/2024  2:40 PM Laurey Morale, MD [facility] None

## 2024-01-25 ENCOUNTER — Other Ambulatory Visit (HOSPITAL_COMMUNITY): Payer: Self-pay

## 2024-01-28 ENCOUNTER — Other Ambulatory Visit (HOSPITAL_COMMUNITY): Payer: Self-pay | Admitting: Emergency Medicine

## 2024-01-28 NOTE — Progress Notes (Signed)
 Paramedicine Encounter    Patient ID: Marcus Tran, male    DOB: [birth year], 73 y.o.   MRN: [id number]   Complaints NONE  Assessment A&O x 4, skin W&D w/ good color.  Denies chest pain or SOB.  Lung sounds clear throughout and no peripheral edema noted.  Compliance with meds YES  Pill box filled n/a  Uses bubble packs.  Refills needed NONE  Meds changes since last visit NONE    Social changes NONE   BP 100/70 (BP Location: Left Arm, Patient Position: Sitting, Cuff Size: Normal)   Pulse 70   Resp 16   Wt 141 lb (64 kg)   SpO2 99%   BMI 23.46 kg/m  Weight yesterday-not taken Last visit weight-155lb  ACTION: Home visit completed  Bethanie Dicker [phone] 01/28/24  Patient Care Team: Hillery Aldo, NP as PCP - Reatha Armour, MD as PCP - Cardiology (Cardiology) Hunsucker, Lesia Sago, MD as Consulting Physician (Pulmonary Disease)  Patient Active Problem List   Diagnosis Date Noted   Cocaine use 01/15/2023   Noncompliance 01/13/2023   Adenovirus pneumonia 11/12/2022   Chronic HFrEF (heart failure with reduced ejection fraction) (HCC) 08/13/2022   DM2 (diabetes mellitus, type 2) (HCC) 08/13/2022   Obstructive sleep apnea (adult) (pediatric) 08/08/2022   Acute respiratory failure with hypoxia and hypercapnia (HCC) 05/04/2022   Third degree heart block (HCC) 04/15/2022   Snoring 04/15/2022   Nonischemic cardiomyopathy (HCC) 04/15/2022   HFrEF (heart failure with reduced ejection fraction) (HCC) 04/15/2022   COPD with acute exacerbation (HCC) 03/22/2022   Chronic combined systolic and diastolic CHF (congestive heart failure) (HCC)    Tobacco use disorder 03/08/2022   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   HTN (hypertension) 03/08/2022   Stage 3a chronic kidney disease (CKD) (HCC) 03/08/2022   Coronary artery disease 03/08/2022    Current Outpatient Medications:    Acoramidis, 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  Twice Daily, (ATTRUBY) 356 MG TBPK, Take 712 mg  by mouth 2 (two) times daily., Disp: 112 each, Rfl: 11   albuterol (VENTOLIN HFA) 108 (90 Base) MCG/ACT inhaler, Inhale 2 puffs into the lungs every 4 (four) hours as needed for wheezing or shortness of breath., Disp: 1 each, Rfl: 2   atorvastatin (LIPITOR) 80 MG tablet, TAKE 1 Tablet BY MOUTH ONCE DAILY EVERY EVENING, Disp: 90 tablet, Rfl: 3   bisoprolol (ZEBETA) 5 MG tablet, Take 1 tablet (5 mg total) by mouth daily., Disp: 90 tablet, Rfl: 3   dapagliflozin propanediol (FARXIGA) 10 MG TABS tablet, Take 1 tablet (10 mg total) by mouth daily., Disp: 30 tablet, Rfl: 11   furosemide (LASIX) 20 MG tablet, Take 1.5 tablets (30 mg total) by mouth daily., Disp: 45 tablet, Rfl: 6   ipratropium-albuterol (DUONEB) 0.5-2.5 (3) MG/3ML SOLN, Inhale 3 mLs into the lungs in the morning, at noon, in the evening, and at bedtime. (Patient taking differently: Inhale 3 mLs into the lungs in the morning, at noon, in the evening, and at bedtime. As needed), Disp: 360 mL, Rfl: 11   isosorbide-hydrALAZINE (BIDIL) 20-37.5 MG tablet, TAKE half TABLET BY MOUTH THREE TIMES DAILY (MORNING, noon, evening), Disp: 45 tablet, Rfl: 5   metFORMIN (GLUCOPHAGE) 500 MG tablet, Take 1 tablet (500 mg total) by mouth 2 (two) times daily with a meal., Disp: 60 tablet, Rfl: 0   montelukast (SINGULAIR) 10 MG tablet, TAKE 1 Tablet BY MOUTH ONCE DAILY at bedtime (evening), Disp: 30 tablet, Rfl: 11   sacubitril-valsartan (ENTRESTO) 24-26 MG, Take 1 tablet by  mouth 2 (two) times daily., Disp: 60 tablet, Rfl: 11   spironolactone (ALDACTONE) 25 MG tablet, Take 1 tablet (25 mg total) by mouth daily., Disp: 90 tablet, Rfl: 3   TRELEGY ELLIPTA 100-62.5-25 MCG/ACT AEPB, Take 1 puff by mouth daily., Disp: , Rfl:  Allergies  Allergen Reactions   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    Allergic dermatitis/rash     Social History   Socioeconomic History   Marital status: Single    Spouse name: Not on file   Number of children: 1   Years of education:  Not on file   Highest education level: 10th grade  Occupational History   Occupation: Disability  Tobacco Use   Smoking status: Some Days    Current packs/day: 0.00    Average packs/day: 0.5 packs/day for 50.0 years (25.0 ttl pk-yrs)    Types: Cigarettes    Start date: 03/19/1972    Last attempt to quit: 03/19/2022    Years since quitting: 1.8   Smokeless tobacco: Never   Tobacco comments:    Smoking cessation  Vaping Use   Vaping status: Never Used  Substance and Sexual Activity   Alcohol use: Not on file    Comment: socially   Drug use: Yes    Types: Marijuana    Comment: past   Sexual activity: Not on file  Other Topics Concern   Not on file  Social History Narrative   He lives with his daughter apparently and 5 grandchildren.  He reports that he smokes probably less than a pack of cigarettes a day.  He occasionally drinks a beer or liquor but not daily.   Social Drivers of Corporate investment banker Strain: Low Risk  (03/11/2022)   Overall Financial Resource Strain (CARDIA)    Difficulty of Paying Living Expenses: Not very hard  Food Insecurity: No Food Insecurity (01/13/2023)   Hunger Vital Sign    Worried About Running Out of Food in the Last Year: Never true    Ran Out of Food in the Last Year: Never true  Recent Concern: Food Insecurity - Food Insecurity Present (10/26/2022)   Hunger Vital Sign    Worried About Running Out of Food in the Last Year: Often true    Ran Out of Food in the Last Year: Often true  Transportation Needs: Unmet Transportation Needs (01/06/2024)   PRAPARE - Administrator, Civil Service (Medical): Yes    Lack of Transportation (Non-Medical): Yes  Physical Activity: Not on file  Stress: Not on file  Social Connections: Not on file  Intimate Partner Violence: Not At Risk (01/13/2023)   Humiliation, Afraid, Rape, and Kick questionnaire    Fear of Current or Ex-Partner: No    Emotionally Abused: No    Physically Abused: No    Sexually  Abused: No    Physical Exam      Future Appointments  Date Time Provider Department Center  02/16/2024  8:00 PM Quintella Reichert, MD [facility]  03/30/2024  2:40 PM Laurey Morale, MD [facility] None

## 2024-01-31 ENCOUNTER — Other Ambulatory Visit: Payer: Self-pay

## 2024-01-31 ENCOUNTER — Other Ambulatory Visit (HOSPITAL_COMMUNITY): Payer: Self-pay

## 2024-02-01 ENCOUNTER — Other Ambulatory Visit: Payer: Self-pay

## 2024-02-01 NOTE — Progress Notes (Signed)
 Patient called the Specialty Pharmacy reporting that he could not locate the medication delivery that was sent on 01/19/24. Insurance override for lost rx was approved. Patient is aware that medication Attruby needs to be ordered and pt request delivery.  Medication will be fill 02/02/24 pending inventory.   Patient will receive delivery on 02/03/24 to : [address] [place] [address]

## 2024-02-02 ENCOUNTER — Other Ambulatory Visit: Payer: Self-pay

## 2024-02-09 ENCOUNTER — Other Ambulatory Visit (HOSPITAL_COMMUNITY): Payer: Self-pay | Admitting: Emergency Medicine

## 2024-02-10 NOTE — Progress Notes (Signed)
 Paramedicine Encounter    Patient ID: Marcus Tran, male    DOB: 05/17/1951, 73 y.o.   MRN: [id number]   Complaints NONE  Assessment A&O x 4, skin W&D w/ good color.  Denies chest pain or SOB.  Lung sounds clear bilat and no peripheral edema noted.  Compliance with meds YES (Lost his Attruby and missed 1 week, found it and continued taking as directed)  Pill box filled n/a  Refills needed n/a  Meds changes since last visit NONE    Social changes NONE   BP 120/78 (BP Location: Left Arm, Patient Position: Sitting, Cuff Size: Normal)   Pulse 60   Resp 16   SpO2 96%  Weight yesterday-not taken Last visit weight-141lb   [name] A&O x 4, skin W&D w/ good color.  Denies chest pain or SOB.  Lung sounds clear bilat and no peripheral edema noted.  Marcus Tran does well w/ med compliance.  He did get off track w/ his Attruby when he lost the medicine pack x 1 week.  When he found it again he resumed taking same. Pt has a sleep study coming up on 4/30.  I assisted him in filling out the paperwork for the visit and gave to him to keep and bring w/ him to the visit.  The current plan is for his daughter to take him to his appointment on the evening of the 30th.   ACTION: Home visit completed  Marcus Tran [phone] 02/10/24  Patient Care Team: Maryellen Snare, NP as PCP - General Eilleen Grates, MD as PCP - Cardiology (Cardiology) Hunsucker, Archer Kobs, MD as Consulting Physician (Pulmonary Disease)  Patient Active Problem List   Diagnosis Date Noted   Cocaine use 01/15/2023   Noncompliance 01/13/2023   Adenovirus pneumonia 11/12/2022   Chronic HFrEF (heart failure with reduced ejection fraction) (HCC) 08/13/2022   DM2 (diabetes mellitus, type 2) (HCC) 08/13/2022   Obstructive sleep apnea (adult) (pediatric) 08/08/2022   Acute respiratory failure with hypoxia and hypercapnia (HCC) 05/04/2022   Third degree heart block (HCC) 04/15/2022   Snoring 04/15/2022    Nonischemic cardiomyopathy (HCC) 04/15/2022   HFrEF (heart failure with reduced ejection fraction) (HCC) 04/15/2022   COPD with acute exacerbation (HCC) 03/22/2022   Chronic combined systolic and diastolic CHF (congestive heart failure) (HCC)    Tobacco use disorder 03/08/2022   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   HTN (hypertension) 03/08/2022   Stage 3a chronic kidney disease (CKD) (HCC) 03/08/2022   Coronary artery disease 03/08/2022    Current Outpatient Medications:    Acoramidis, 712MG  Twice Daily, (ATTRUBY) 356 MG TBPK, Take 712 mg by mouth 2 (two) times daily., Disp: 112 each, Rfl: 11   albuterol  (VENTOLIN  HFA) 108 (90 Base) MCG/ACT inhaler, Inhale 2 puffs into the lungs every 4 (four) hours as needed for wheezing or shortness of breath., Disp: 1 each, Rfl: 2   atorvastatin  (LIPITOR ) 80 MG tablet, TAKE 1 Tablet BY MOUTH ONCE DAILY EVERY EVENING, Disp: 90 tablet, Rfl: 3   bisoprolol  (ZEBETA ) 5 MG tablet, Take 1 tablet (5 mg total) by mouth daily., Disp: 90 tablet, Rfl: 3   dapagliflozin  propanediol (FARXIGA ) 10 MG TABS tablet, Take 1 tablet (10 mg total) by mouth daily., Disp: 30 tablet, Rfl: 11   furosemide  (LASIX ) 20 MG tablet, Take 1.5 tablets (30 mg total) by mouth daily., Disp: 45 tablet, Rfl: 6   ipratropium-albuterol  (DUONEB) 0.5-2.5 (3) MG/3ML SOLN, Inhale 3 mLs into the lungs in the  morning, at noon, in the evening, and at bedtime. (Patient taking differently: Inhale 3 mLs into the lungs in the morning, at noon, in the evening, and at bedtime. As needed), Disp: 360 mL, Rfl: 11   isosorbide -hydrALAZINE  (BIDIL ) 20-37.5 MG tablet, TAKE half TABLET BY MOUTH THREE TIMES DAILY (MORNING, noon, evening), Disp: 45 tablet, Rfl: 5   metFORMIN  (GLUCOPHAGE ) 500 MG tablet, Take 1 tablet (500 mg total) by mouth 2 (two) times daily with a meal., Disp: 60 tablet, Rfl: 0   montelukast  (SINGULAIR ) 10 MG tablet, TAKE 1 Tablet BY MOUTH ONCE DAILY at bedtime (evening), Disp: 30 tablet,  Rfl: 11   sacubitril -valsartan  (ENTRESTO ) 24-26 MG, Take 1 tablet by mouth 2 (two) times daily., Disp: 60 tablet, Rfl: 11   spironolactone  (ALDACTONE ) 25 MG tablet, Take 1 tablet (25 mg total) by mouth daily., Disp: 90 tablet, Rfl: 3   TRELEGY ELLIPTA 100-62.5-25 MCG/ACT AEPB, Take 1 puff by mouth daily., Disp: , Rfl:  Allergies  Allergen Reactions   Vyndamax  [Tafamidis ] Dermatitis    Allergic dermatitis/rash     Social History   Socioeconomic History   Marital status: Single    Spouse name: Not on file   Number of children: 1   Years of education: Not on file   Highest education level: 10th grade  Occupational History   Occupation: Disability  Tobacco Use   Smoking status: Some Days    Current packs/day: 0.00    Average packs/day: 0.5 packs/day for 50.0 years (25.0 ttl pk-yrs)    Types: Cigarettes    Start date: 03/19/1972    Last attempt to quit: 03/19/2022    Years since quitting: 1.8   Smokeless tobacco: Never   Tobacco comments:    Smoking cessation  Vaping Use   Vaping status: Never Used  Substance and Sexual Activity   Alcohol use: Not on file    Comment: socially   Drug use: Yes    Types: Marijuana    Comment: past   Sexual activity: Not on file  Other Topics Concern   Not on file  Social History Narrative   He lives with his daughter apparently and 5 grandchildren.  He reports that he smokes probably less than a pack of cigarettes a day.  He occasionally drinks a beer or liquor but not daily.   Social Drivers of Corporate investment banker Strain: Low Risk  (03/11/2022)   Overall Financial Resource Strain (CARDIA)    Difficulty of Paying Living Expenses: Not very hard  Food Insecurity: No Food Insecurity (01/13/2023)   Hunger Vital Sign    Worried About Running Out of Food in the Last Year: Never true    Ran Out of Food in the Last Year: Never true  Recent Concern: Food Insecurity - Food Insecurity Present (10/26/2022)   Hunger Vital Sign    Worried About  Programme researcher, broadcasting/film/video in the Last Year: Often true    Ran Out of Food in the Last Year: Often true  Transportation Needs: Unmet Transportation Needs (01/06/2024)   PRAPARE - Administrator, Civil Service (Medical): Yes    Lack of Transportation (Non-Medical): Yes  Physical Activity: Not on file  Stress: Not on file  Social Connections: Not on file  Intimate Partner Violence: Not At Risk (01/13/2023)   Humiliation, Afraid, Rape, and Kick questionnaire    Fear of Current or Ex-Partner: No    Emotionally Abused: No    Physically Abused: No  Sexually Abused: No    Physical Exam      Future Appointments  Date Time Provider Department Center  02/16/2024  8:00 PM Jacqueline Matsu, MD [facility]  03/30/2024  2:40 PM Darlis Eisenmenger, MD [facility] None

## 2024-02-15 ENCOUNTER — Other Ambulatory Visit: Payer: Self-pay

## 2024-02-16 ENCOUNTER — Ambulatory Visit (HOSPITAL_BASED_OUTPATIENT_CLINIC_OR_DEPARTMENT_OTHER): Payer: 59 | Attending: Cardiology | Admitting: Cardiology

## 2024-02-16 VITALS — Ht 65.0 in | Wt 141.0 lb

## 2024-02-16 DIAGNOSIS — I4729 Other ventricular tachycardia: Secondary | ICD-10-CM | POA: Diagnosis not present

## 2024-02-16 DIAGNOSIS — I110 Hypertensive heart disease with heart failure: Secondary | ICD-10-CM | POA: Insufficient documentation

## 2024-02-16 DIAGNOSIS — G4733 Obstructive sleep apnea (adult) (pediatric): Secondary | ICD-10-CM | POA: Diagnosis present

## 2024-02-16 DIAGNOSIS — I5022 Chronic systolic (congestive) heart failure: Secondary | ICD-10-CM | POA: Insufficient documentation

## 2024-02-16 DIAGNOSIS — I2510 Atherosclerotic heart disease of native coronary artery without angina pectoris: Secondary | ICD-10-CM | POA: Insufficient documentation

## 2024-02-22 NOTE — Procedures (Signed)
   Maryan Smalling [facility] [address] [place], [place] [address] Tel: [phone]   Fax: [phone]  Titration Interpretation  Patient Name:  Marcus Tran, Marcus Tran Date:  02/16/2024 Referring Physician:  Dr. Gaylyn Keas  Indications for Polysomnography The patient is a 73 year old Male who is 5\' 5"  and weighs 141.0 lbs. His BMI equals 23.5.  A full night titration treatment study was performed.  Medications taken at 2220.  MONTELUKAST   ENTRESTO   ATORVASTATIN   ATTRUBY  ISOSORBIDE -HYDRALAZINE    Polysomnogram Data A full night polysomnogram recorded the standard physiologic parameters including EEG, EOG, EMG, EKG, nasal and oral airflow.  Respiratory parameters of chest and abdominal movements were recorded with Respiratory Inductance Plethysmography belts.  Oxygen saturation was recorded by pulse oximetry.   Sleep Architecture The total recording time of the polysomnogram was 376.0 minutes.  The total sleep time was 317.0 minutes.  The patient spent 7.6% of total sleep time in Stage N1, 74.6% in Stage N2, 0.0% in Stages N3, and 17.8% in REM.  Sleep latency was 7.9 minutes.  REM latency was 95.0 minutes.  Sleep Efficiency was 84.3%.  Wake after Sleep Onset time was 51.0 minutes.  Titration Summary The patient was titrated at pressures ranging from 8/4 cm/H20 up to 16/12 cm/H20 with back up rate of 12b reaths per min.  The last pressure used in the study was 16/12 cm/H20 ack up rate of 10/hr.   Respiratory Events The polysomnogram revealed a presence of 5 obstructive, 21 centrals, and - mixed apneas resulting in an Apnea index of 4.9 events per hour.  There were 51 hypopneas (>=3% desaturation and/or arousal) resulting in an Apnea\Hypopnea Index (AHI >=3% desaturation and/or arousal) of 14.6 events per hour.  There were 37 hypopneas (>=4% desaturation) resulting in an Apnea\Hypopnea Index (AHI >=4% desaturation) of 11.9 events per hour.  There were 16 Respiratory  Effort Related Arousals resulting in a RERA index of 3.0 events per hour. The Respiratory Disturbance Index is 17.6 events per hour.  The snore index was 0 events per hour.  Mean oxygen saturation was 95.8%.  The lowest oxygen saturation during sleep was 90.0%.  Time spent <=88% oxygen saturation was 0 minutes  Limb Activity There were 280 limb movements recorded.  Of this total, 279 were classified as PLMs.  Of the PLMs, 9 were associated with arousals.  The Limb Movement index was 53.0 per hour while the PLM index was 52.8 per hour.  Cardiac Summary The average pulse rate was 65.7 bpm.  The minimum pulse rate was 54.0 bpm while the maximum pulse rate was 81.0 bpm.  Cardiac rhythm was abnormal with NSR, PVCs and 10 beat run of NSVT.  Diagnosis:  Obstructive Sleep Apnea Nonsustained Ventricular Tachycardia  Recommendations: Recommend a trial of ResMed BiPAP at 16/12cm H2O with back up rate of 10/min, heated humidity and medium [employer] Simplus FFM. The patient should be counseled in good sleep hygiene. The patient should be encouraged to avoid sleeping in the supine position. Encourage patient to avoid driving when sleepy. Followup in Sleep Medicine Clinic in 6 weeks.   This study was personally reviewed and electronically signed by: Dr. Gaylyn Keas Accredited Board Certified in Sleep Medicine Date/Time: 02/22/2024 9:56PM

## 2024-02-23 ENCOUNTER — Other Ambulatory Visit: Payer: Self-pay

## 2024-02-23 ENCOUNTER — Other Ambulatory Visit (HOSPITAL_COMMUNITY): Payer: Self-pay | Admitting: Emergency Medicine

## 2024-02-23 NOTE — Progress Notes (Signed)
 Specialty Pharmacy Refill Coordination Note  Marcus Tran is a 73 y.o. male contacted today regarding refills of specialty medication(s) Acoramidis HCl [name]  Patient requested Delivery   Delivery date: 02/28/24   Verified address: [address]   [place] [address]   Medication will be filled on 05.09.25.

## 2024-02-23 NOTE — Progress Notes (Signed)
 Paramedicine Encounter    Patient ID: Marcus Tran, male    DOB: 1951/05/27, 73 y.o.   MRN: [id number]   Complaints NONE  Assessment A&O x 4, skin W&D w/ good color. Denies chest pain or SOB.  Lung sounds w/ mild expiratory wheezing.    Compliance with meds YES  Pill box filled n/a  uses bubble packs  Refills needed bubble packs to be delivered tomorrow  Meds changes since last visit NONE    Social changes NONE   BP 90/60 (BP Location: Left Arm, Patient Position: Sitting, Cuff Size: Normal)   Pulse 70   Resp 16   Wt 141 lb 9.6 oz (64.2 kg)   SpO2 92%   BMI 23.56 kg/m  Weight yesterday- not taken Last visit weight-143lb  [name] sitting outside with his grandchildren.  Pt. Reports to be feeling well. Denies chest pain or SOB.  No dizziness or weakness.  No difficulty urinating or defecating.  He continues to do well w/ bubble pack compliance.  His new bubble packs will be delivered tomorrow.   Pt states that he did not like doing the sleep study test.  I discussed w/ him how sleep apnea effects the heart and should he need a CPAP device  it would be helpful in strengthening his heart.   ACTION: Home visit completed  Carlton Chick [phone] 02/23/24  Patient Care Team: Maryellen Snare, NP as PCP - General Eilleen Grates, MD as PCP - Cardiology (Cardiology) Hunsucker, Archer Kobs, MD as Consulting Physician (Pulmonary Disease)  Patient Active Problem List   Diagnosis Date Noted   Cocaine use 01/15/2023   Noncompliance 01/13/2023   Adenovirus pneumonia 11/12/2022   Chronic HFrEF (heart failure with reduced ejection fraction) (HCC) 08/13/2022   DM2 (diabetes mellitus, type 2) (HCC) 08/13/2022   Obstructive sleep apnea (adult) (pediatric) 08/08/2022   Acute respiratory failure with hypoxia and hypercapnia (HCC) 05/04/2022   Third degree heart block (HCC) 04/15/2022   Snoring 04/15/2022   Nonischemic cardiomyopathy (HCC) 04/15/2022   HFrEF (heart  failure with reduced ejection fraction) (HCC) 04/15/2022   COPD with acute exacerbation (HCC) 03/22/2022   Chronic combined systolic and diastolic CHF (congestive heart failure) (HCC)    Tobacco use disorder 03/08/2022   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   HTN (hypertension) 03/08/2022   Stage 3a chronic kidney disease (CKD) (HCC) 03/08/2022   Coronary artery disease 03/08/2022    Current Outpatient Medications:    Acoramidis, 712MG  Twice Daily, (ATTRUBY) 356 MG TBPK, Take 712 mg by mouth 2 (two) times daily., Disp: 112 each, Rfl: 11   albuterol  (VENTOLIN  HFA) 108 (90 Base) MCG/ACT inhaler, Inhale 2 puffs into the lungs every 4 (four) hours as needed for wheezing or shortness of breath., Disp: 1 each, Rfl: 2   atorvastatin  (LIPITOR ) 80 MG tablet, TAKE 1 Tablet BY MOUTH ONCE DAILY EVERY EVENING, Disp: 90 tablet, Rfl: 3   bisoprolol  (ZEBETA ) 5 MG tablet, Take 1 tablet (5 mg total) by mouth daily., Disp: 90 tablet, Rfl: 3   dapagliflozin  propanediol (FARXIGA ) 10 MG TABS tablet, Take 1 tablet (10 mg total) by mouth daily., Disp: 30 tablet, Rfl: 11   furosemide  (LASIX ) 20 MG tablet, Take 1.5 tablets (30 mg total) by mouth daily., Disp: 45 tablet, Rfl: 6   ipratropium-albuterol  (DUONEB) 0.5-2.5 (3) MG/3ML SOLN, Inhale 3 mLs into the lungs in the morning, at noon, in the evening, and at bedtime. (Patient taking differently: Inhale 3 mLs into the lungs in  the morning, at noon, in the evening, and at bedtime. As needed), Disp: 360 mL, Rfl: 11   isosorbide -hydrALAZINE  (BIDIL ) 20-37.5 MG tablet, TAKE half TABLET BY MOUTH THREE TIMES DAILY (MORNING, noon, evening), Disp: 45 tablet, Rfl: 5   metFORMIN  (GLUCOPHAGE ) 500 MG tablet, Take 1 tablet (500 mg total) by mouth 2 (two) times daily with a meal., Disp: 60 tablet, Rfl: 0   montelukast  (SINGULAIR ) 10 MG tablet, TAKE 1 Tablet BY MOUTH ONCE DAILY at bedtime (evening), Disp: 30 tablet, Rfl: 11   sacubitril -valsartan  (ENTRESTO ) 24-26 MG, Take 1  tablet by mouth 2 (two) times daily., Disp: 60 tablet, Rfl: 11   spironolactone  (ALDACTONE ) 25 MG tablet, Take 1 tablet (25 mg total) by mouth daily., Disp: 90 tablet, Rfl: 3   TRELEGY ELLIPTA 100-62.5-25 MCG/ACT AEPB, Take 1 puff by mouth daily., Disp: , Rfl:  Allergies  Allergen Reactions   Vyndamax  [Tafamidis ] Dermatitis    Allergic dermatitis/rash     Social History   Socioeconomic History   Marital status: Single    Spouse name: Not on file   Number of children: 1   Years of education: Not on file   Highest education level: 10th grade  Occupational History   Occupation: Disability  Tobacco Use   Smoking status: Some Days    Current packs/day: 0.00    Average packs/day: 0.5 packs/day for 50.0 years (25.0 ttl pk-yrs)    Types: Cigarettes    Start date: 03/19/1972    Last attempt to quit: 03/19/2022    Years since quitting: 1.9   Smokeless tobacco: Never   Tobacco comments:    Smoking cessation  Vaping Use   Vaping status: Never Used  Substance and Sexual Activity   Alcohol use: Not on file    Comment: socially   Drug use: Yes    Types: Marijuana    Comment: past   Sexual activity: Not on file  Other Topics Concern   Not on file  Social History Narrative   He lives with his daughter apparently and 5 grandchildren.  He reports that he smokes probably less than a pack of cigarettes a day.  He occasionally drinks a beer or liquor but not daily.   Social Drivers of Corporate investment banker Strain: Low Risk  (03/11/2022)   Overall Financial Resource Strain (CARDIA)    Difficulty of Paying Living Expenses: Not very hard  Food Insecurity: No Food Insecurity (01/13/2023)   Hunger Vital Sign    Worried About Running Out of Food in the Last Year: Never true    Ran Out of Food in the Last Year: Never true  Recent Concern: Food Insecurity - Food Insecurity Present (10/26/2022)   Hunger Vital Sign    Worried About Programme researcher, broadcasting/film/video in the Last Year: Often true    Ran Out of  Food in the Last Year: Often true  Transportation Needs: Unmet Transportation Needs (01/06/2024)   PRAPARE - Administrator, Civil Service (Medical): Yes    Lack of Transportation (Non-Medical): Yes  Physical Activity: Not on file  Stress: Not on file  Social Connections: Not on file  Intimate Partner Violence: Not At Risk (01/13/2023)   Humiliation, Afraid, Rape, and Kick questionnaire    Fear of Current or Ex-Partner: No    Emotionally Abused: No    Physically Abused: No    Sexually Abused: No    Physical Exam      Future Appointments  Date Time Provider  Department Center  03/30/2024  2:40 PM Darlis Eisenmenger, MD [facility] None

## 2024-02-24 ENCOUNTER — Telehealth: Payer: Self-pay

## 2024-02-24 NOTE — Telephone Encounter (Signed)
-----   Message from Gaylyn Keas sent at 02/22/2024  9:57 PM EDT ----- Please let patient know that they had a successful PAP titration and let DME know that orders are in EPIC.  Please set up 6 week OV with me.

## 2024-02-24 NOTE — Telephone Encounter (Signed)
 Notified patient, via VM, of successful titration. Order for new PAP device and supplies sent to [facility] today.

## 2024-02-25 ENCOUNTER — Other Ambulatory Visit: Payer: Self-pay

## 2024-03-09 ENCOUNTER — Other Ambulatory Visit (HOSPITAL_COMMUNITY): Payer: Self-pay | Admitting: Emergency Medicine

## 2024-03-10 NOTE — Progress Notes (Signed)
 Paramedicine Encounter    Patient ID: Marcus Tran, male    DOB: April 19, 1951, 73 y.o.   MRN: [id number]   Complaints NONE  Assessment A&O x 4, skin W&D w/ good color.  Denies chest pain or SOB.  Lung sounds clear and equal bilat. Bubble packs reviewed for accuracy.  Also confirmed that he is taking 20mg  Lasix  on M/W/F.  Marked his bubble packs w/ asterisk for those days to make sure he doesn't forget to take same. Also during this visit worked w/ pt to educate him on how to apply his CPAP.  Also called [facility] and set up a video visit w/ them for next Friday 5/30 @ 9:00 to review the equipment once again should any questions arise over the weekend use.   Compliance with meds YES  Pill box filled n/a  Refills needed NONE  Meds changes since last visit NONE    Social changes NONE   BP 98/60 (BP Location: Right Arm, Patient Position: Sitting, Cuff Size: Normal)   Wt 142 lb (64.4 kg)   BMI 23.63 kg/m  Weight yesterday-not taken Last visit weight-141lb (02/23/24)  ACTION: Home visit completed  Carlton Chick [phone] 03/10/24  Patient Care Team: Maryellen Snare, NP as PCP - Pamela Boeck, MD as PCP - Cardiology (Cardiology) Hunsucker, Archer Kobs, MD as Consulting Physician (Pulmonary Disease)  Patient Active Problem List   Diagnosis Date Noted   Cocaine use 01/15/2023   Noncompliance 01/13/2023   Adenovirus pneumonia 11/12/2022   Chronic HFrEF (heart failure with reduced ejection fraction) (HCC) 08/13/2022   DM2 (diabetes mellitus, type 2) (HCC) 08/13/2022   Obstructive sleep apnea (adult) (pediatric) 08/08/2022   Acute respiratory failure with hypoxia and hypercapnia (HCC) 05/04/2022   Third degree heart block (HCC) 04/15/2022   Snoring 04/15/2022   Nonischemic cardiomyopathy (HCC) 04/15/2022   HFrEF (heart failure with reduced ejection fraction) (HCC) 04/15/2022   COPD with acute exacerbation (HCC) 03/22/2022   Chronic combined systolic and  diastolic CHF (congestive heart failure) (HCC)    Tobacco use disorder 03/08/2022   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   HTN (hypertension) 03/08/2022   Stage 3a chronic kidney disease (CKD) (HCC) 03/08/2022   Coronary artery disease 03/08/2022    Current Outpatient Medications:    Acoramidis, 712MG  Twice Daily, (ATTRUBY) 356 MG TBPK, Take 712 mg by mouth 2 (two) times daily., Disp: 112 each, Rfl: 11   albuterol  (VENTOLIN  HFA) 108 (90 Base) MCG/ACT inhaler, Inhale 2 puffs into the lungs every 4 (four) hours as needed for wheezing or shortness of breath., Disp: 1 each, Rfl: 2   atorvastatin  (LIPITOR ) 80 MG tablet, TAKE 1 Tablet BY MOUTH ONCE DAILY EVERY EVENING, Disp: 90 tablet, Rfl: 3   bisoprolol  (ZEBETA ) 5 MG tablet, Take 1 tablet (5 mg total) by mouth daily., Disp: 90 tablet, Rfl: 3   dapagliflozin  propanediol (FARXIGA ) 10 MG TABS tablet, Take 1 tablet (10 mg total) by mouth daily., Disp: 30 tablet, Rfl: 11   furosemide  (LASIX ) 20 MG tablet, Take 1.5 tablets (30 mg total) by mouth daily., Disp: 45 tablet, Rfl: 6   ipratropium-albuterol  (DUONEB) 0.5-2.5 (3) MG/3ML SOLN, Inhale 3 mLs into the lungs in the morning, at noon, in the evening, and at bedtime. (Patient taking differently: Inhale 3 mLs into the lungs in the morning, at noon, in the evening, and at bedtime. As needed), Disp: 360 mL, Rfl: 11   isosorbide -hydrALAZINE  (BIDIL ) 20-37.5 MG tablet, TAKE half TABLET BY MOUTH THREE TIMES DAILY (MORNING,  noon, evening), Disp: 45 tablet, Rfl: 5   metFORMIN  (GLUCOPHAGE ) 500 MG tablet, Take 1 tablet (500 mg total) by mouth 2 (two) times daily with a meal., Disp: 60 tablet, Rfl: 0   montelukast  (SINGULAIR ) 10 MG tablet, TAKE 1 Tablet BY MOUTH ONCE DAILY at bedtime (evening), Disp: 30 tablet, Rfl: 11   sacubitril -valsartan  (ENTRESTO ) 24-26 MG, Take 1 tablet by mouth 2 (two) times daily., Disp: 60 tablet, Rfl: 11   spironolactone  (ALDACTONE ) 25 MG tablet, Take 1 tablet (25 mg total) by  mouth daily., Disp: 90 tablet, Rfl: 3   TRELEGY ELLIPTA 100-62.5-25 MCG/ACT AEPB, Take 1 puff by mouth daily., Disp: , Rfl:  Allergies  Allergen Reactions   Vyndamax  [Tafamidis ] Dermatitis    Allergic dermatitis/rash     Social History   Socioeconomic History   Marital status: Single    Spouse name: Not on file   Number of children: 1   Years of education: Not on file   Highest education level: 10th grade  Occupational History   Occupation: Disability  Tobacco Use   Smoking status: Some Days    Current packs/day: 0.00    Average packs/day: 0.5 packs/day for 50.0 years (25.0 ttl pk-yrs)    Types: Cigarettes    Start date: 03/19/1972    Last attempt to quit: 03/19/2022    Years since quitting: 1.9   Smokeless tobacco: Never   Tobacco comments:    Smoking cessation  Vaping Use   Vaping status: Never Used  Substance and Sexual Activity   Alcohol use: Not on file    Comment: socially   Drug use: Yes    Types: Marijuana    Comment: past   Sexual activity: Not on file  Other Topics Concern   Not on file  Social History Narrative   He lives with his daughter apparently and 5 grandchildren.  He reports that he smokes probably less than a pack of cigarettes a day.  He occasionally drinks a beer or liquor but not daily.   Social Drivers of Corporate investment banker Strain: Low Risk  (03/11/2022)   Overall Financial Resource Strain (CARDIA)    Difficulty of Paying Living Expenses: Not very hard  Food Insecurity: No Food Insecurity (01/13/2023)   Hunger Vital Sign    Worried About Running Out of Food in the Last Year: Never true    Ran Out of Food in the Last Year: Never true  Recent Concern: Food Insecurity - Food Insecurity Present (10/26/2022)   Hunger Vital Sign    Worried About Running Out of Food in the Last Year: Often true    Ran Out of Food in the Last Year: Often true  Transportation Needs: Unmet Transportation Needs (01/06/2024)   PRAPARE - Scientist, research (physical sciences) (Medical): Yes    Lack of Transportation (Non-Medical): Yes  Physical Activity: Not on file  Stress: Not on file  Social Connections: Not on file  Intimate Partner Violence: Not At Risk (01/13/2023)   Humiliation, Afraid, Rape, and Kick questionnaire    Fear of Current or Ex-Partner: No    Emotionally Abused: No    Physically Abused: No    Sexually Abused: No    Physical Exam      Future Appointments  Date Time Provider Department Center  03/30/2024  2:40 PM Darlis Eisenmenger, MD [facility] None

## 2024-03-21 ENCOUNTER — Other Ambulatory Visit (HOSPITAL_COMMUNITY): Payer: Self-pay | Admitting: Family Medicine

## 2024-03-22 ENCOUNTER — Other Ambulatory Visit (HOSPITAL_COMMUNITY): Payer: Self-pay

## 2024-03-22 MED ORDER — ENTRESTO 24-26 MG PO TABS: 1.0000 | ORAL_TABLET | Freq: Two times a day (BID) | ORAL | 11 refills | Status: DC

## 2024-03-23 ENCOUNTER — Other Ambulatory Visit: Payer: Self-pay

## 2024-03-23 ENCOUNTER — Other Ambulatory Visit (HOSPITAL_COMMUNITY): Payer: Self-pay | Admitting: Emergency Medicine

## 2024-03-23 ENCOUNTER — Other Ambulatory Visit: Payer: Self-pay | Admitting: Pharmacy Technician

## 2024-03-23 NOTE — Progress Notes (Signed)
 Specialty Pharmacy Refill Coordination Note  Marcus Tran is a 73 y.o. male contacted today regarding refills of specialty medication(s) Acoramidis HCl Lennon Race)  Spoke with family member  Patient requested Delivery   Delivery date: 03/24/24   Verified address: [address]  [place]   Medication will be filled on 03/23/24.

## 2024-03-23 NOTE — Progress Notes (Unsigned)
 Paramedicine Encounter    Patient ID: Marcus Tran, male    DOB: January 16, 1951, 73 y.o.   MRN: [id number]   Complaints***  Assessment***  Compliance with meds***  Pill box filled***  Refills needed***  Meds changes since last visit***    Social changes***   There were no vitals taken for this visit. Weight yesterday-*** Last visit weight-***  ACTION: {Paramed [phone]}  Ermalinda Hays, EMT-Paramedic [phone] 03/23/24  Patient Care Team: Maryellen Snare, NP as PCP - General Eilleen Grates, MD as PCP - Cardiology (Cardiology) Hunsucker, Archer Kobs, MD as Consulting Physician (Pulmonary Disease)  Patient Active Problem List   Diagnosis Date Noted  . Cocaine use 01/15/2023  . Noncompliance 01/13/2023  . Adenovirus pneumonia 11/12/2022  . Chronic HFrEF (heart failure with reduced ejection fraction) (HCC) 08/13/2022  . DM2 (diabetes mellitus, type 2) (HCC) 08/13/2022  . Obstructive sleep apnea (adult) (pediatric) 08/08/2022  . Acute respiratory failure with hypoxia and hypercapnia (HCC) 05/04/2022  . Third degree heart block (HCC) 04/15/2022  . Snoring 04/15/2022  . Nonischemic cardiomyopathy (HCC) 04/15/2022  . HFrEF (heart failure with reduced ejection fraction) (HCC) 04/15/2022  . COPD with acute exacerbation (HCC) 03/22/2022  . Chronic combined systolic and diastolic CHF (congestive heart failure) (HCC)   . Tobacco use disorder 03/08/2022  . 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 HTN (hypertension) 03/08/2022  . Stage 3a chronic kidney disease (CKD) (HCC) 03/08/2022  . Coronary artery disease 03/08/2022    Current Outpatient Medications:  .  Acoramidis, 712MG  Twice Daily, (ATTRUBY) 356 MG TBPK, Take 712 mg by mouth 2 (two) times daily., Disp: 112 each, Rfl: 11 .  albuterol  (VENTOLIN  HFA) 108 (90 Base) MCG/ACT inhaler, Inhale 2 puffs into the lungs every 4 (four) hours as needed for wheezing or shortness of breath., Disp: 1 each, Rfl: 2 .   atorvastatin  (LIPITOR ) 80 MG tablet, TAKE 1 Tablet BY MOUTH ONCE DAILY EVERY EVENING, Disp: 90 tablet, Rfl: 3 .  bisoprolol  (ZEBETA ) 5 MG tablet, Take 1 tablet (5 mg total) by mouth daily., Disp: 90 tablet, Rfl: 3 .  dapagliflozin  propanediol (FARXIGA ) 10 MG TABS tablet, Take 1 tablet (10 mg total) by mouth daily., Disp: 30 tablet, Rfl: 11 .  furosemide  (LASIX ) 20 MG tablet, Take 1.5 tablets (30 mg total) by mouth daily., Disp: 45 tablet, Rfl: 6 .  ipratropium-albuterol  (DUONEB) 0.5-2.5 (3) MG/3ML SOLN, Inhale 3 mLs into the lungs in the morning, at noon, in the evening, and at bedtime. (Patient taking differently: Inhale 3 mLs into the lungs in the morning, at noon, in the evening, and at bedtime. As needed), Disp: 360 mL, Rfl: 11 .  isosorbide -hydrALAZINE  (BIDIL ) 20-37.5 MG tablet, TAKE half TABLET BY MOUTH THREE TIMES DAILY (MORNING, noon, evening), Disp: 45 tablet, Rfl: 5 .  metFORMIN  (GLUCOPHAGE ) 500 MG tablet, Take 1 tablet (500 mg total) by mouth 2 (two) times daily with a meal., Disp: 60 tablet, Rfl: 0 .  montelukast  (SINGULAIR ) 10 MG tablet, TAKE 1 Tablet BY MOUTH ONCE DAILY at bedtime (evening), Disp: 30 tablet, Rfl: 11 .  sacubitril -valsartan  (ENTRESTO ) 24-26 MG, Take 1 tablet by mouth 2 (two) times daily., Disp: 60 tablet, Rfl: 11 .  spironolactone  (ALDACTONE ) 25 MG tablet, Take 1 tablet (25 mg total) by mouth daily., Disp: 90 tablet, Rfl: 3 .  TRELEGY ELLIPTA 100-62.5-25 MCG/ACT AEPB, Take 1 puff by mouth daily., Disp: , Rfl:  Allergies  Allergen Reactions  . Vyndamax  [Tafamidis ] Dermatitis    Allergic dermatitis/rash  Social History   Socioeconomic History  . Marital status: Single    Spouse name: Not on file  . Number of children: 1  . Years of education: Not on file  . Highest education level: 10th grade  Occupational History  . Occupation: Disability  Tobacco Use  . Smoking status: Some Days    Current packs/day: 0.00    Average packs/day: 0.5 packs/day for 50.0  years (25.0 ttl pk-yrs)    Types: Cigarettes    Start date: 03/19/1972    Last attempt to quit: 03/19/2022    Years since quitting: 2.0  . Smokeless tobacco: Never  . Tobacco comments:    Smoking cessation  Vaping Use  . Vaping status: Never Used  Substance and Sexual Activity  . Alcohol use: Not on file    Comment: socially  . Drug use: Yes    Types: Marijuana    Comment: past  . Sexual activity: Not on file  Other Topics Concern  . Not on file  Social History Narrative   He lives with his daughter apparently and 5 grandchildren.  He reports that he smokes probably less than a pack of cigarettes a day.  He occasionally drinks a beer or liquor but not daily.   Social Drivers of Health   Financial Resource Strain: Low Risk  (03/11/2022)   Overall Financial Resource Strain (CARDIA)   . Difficulty of Paying Living Expenses: Not very hard  Food Insecurity: No Food Insecurity (01/13/2023)   Hunger Vital Sign   . Worried About Programme researcher, broadcasting/film/video in the Last Year: Never true   . Ran Out of Food in the Last Year: Never true  Recent Concern: Food Insecurity - Food Insecurity Present (10/26/2022)   Hunger Vital Sign   . Worried About Programme researcher, broadcasting/film/video in the Last Year: Often true   . Ran Out of Food in the Last Year: Often true  Transportation Needs: Unmet Transportation Needs (01/06/2024)   PRAPARE - Transportation   . Lack of Transportation (Medical): Yes   . Lack of Transportation (Non-Medical): Yes  Physical Activity: Not on file  Stress: Not on file  Social Connections: Not on file  Intimate Partner Violence: Not At Risk (01/13/2023)   Humiliation, Afraid, Rape, and Kick questionnaire   . Fear of Current or Ex-Partner: No   . Emotionally Abused: No   . Physically Abused: No   . Sexually Abused: No    Physical Exam      Future Appointments  Date Time Provider Department Center  03/30/2024  2:40 PM Darlis Eisenmenger, MD [facility] None

## 2024-03-30 ENCOUNTER — Encounter (HOSPITAL_COMMUNITY): Admitting: Cardiology

## 2024-03-30 ENCOUNTER — Other Ambulatory Visit (HOSPITAL_COMMUNITY): Payer: Self-pay | Admitting: Emergency Medicine

## 2024-03-30 NOTE — Progress Notes (Signed)
 Paramedicine Encounter    Patient ID: Marcus Tran, male    DOB: 04-14-1951, 73 y.o.   MRN: [id number]   Complaints NONE  Assessment A&O x 4, skin W&D w/ good color.  Denies chest pain or SOB.  Lung sounds clear and equal bilat and no peripheral edema noted.  Compliance with meds YES  Pill box filled N/A  on bubble packs   Refills needed Needs new prescription for Furosemide  20mg . Mon/Wed/Fri.    Meds changes since last visit None   Social changes NONE   BP 100/60 (BP Location: Left Arm, Patient Position: Sitting, Cuff Size: Normal)   Pulse 73   Resp 16   Wt 137 lb 9.6 oz (62.4 kg)   SpO2 93%   BMI 22.90 kg/m  Weight yesterday- not taken Last visit weight-138lb  [name] sitting outside at the picnic table in his front yard.  Pt reports to be feeling well.  He denies chest pain or SOB.  Lung sounds clear throughout and no peripheral edema noted. Pt is doing well with med compliance.  Reviewed pill packs for accuracy.  Pt's Furosemide  is not included in pill packs due to multiple dosage change.  He is currently taking 20mg  Furosemide  Mon/Wed/Fri.  This was changed on 3/26. Also noted that pt's Entresto  dose seems to have been increased to 49-51mg  BID by Dr. Marygrace Snellen on 6/5 but was refilled by Dr. Mitzie Anda on 6/4 for Entresto  24-26mg  BID.  Will reach out to HF Triage to clarify same.  ACTION: Home visit completed  Marcus Tran [phone] 03/30/24  Patient Care Team: Maryellen Snare, NP as PCP - General Eilleen Grates, MD as PCP - Cardiology (Cardiology) Hunsucker, Archer Kobs, MD as Consulting Physician (Pulmonary Disease)  Patient Active Problem List   Diagnosis Date Noted   Cocaine use 01/15/2023   Noncompliance 01/13/2023   Adenovirus pneumonia 11/12/2022   Chronic HFrEF (heart failure with reduced ejection fraction) (HCC) 08/13/2022   DM2 (diabetes mellitus, type 2) (HCC) 08/13/2022   Obstructive sleep apnea (adult) (pediatric) 08/08/2022   Acute  respiratory failure with hypoxia and hypercapnia (HCC) 05/04/2022   Third degree heart block (HCC) 04/15/2022   Snoring 04/15/2022   Nonischemic cardiomyopathy (HCC) 04/15/2022   HFrEF (heart failure with reduced ejection fraction) (HCC) 04/15/2022   COPD with acute exacerbation (HCC) 03/22/2022   Chronic combined systolic and diastolic CHF (congestive heart failure) (HCC)    Tobacco use disorder 03/08/2022   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   HTN (hypertension) 03/08/2022   Stage 3a chronic kidney disease (CKD) (HCC) 03/08/2022   Coronary artery disease 03/08/2022    Current Outpatient Medications:    Acoramidis , 712MG  Twice Daily, (ATTRUBY ) 356 MG TBPK, Take 712 mg by mouth 2 (two) times daily., Disp: 112 each, Rfl: 11   albuterol  (VENTOLIN  HFA) 108 (90 Base) MCG/ACT inhaler, Inhale 2 puffs into the lungs every 4 (four) hours as needed for wheezing or shortness of breath., Disp: 1 each, Rfl: 2   atorvastatin  (LIPITOR ) 80 MG tablet, TAKE 1 Tablet BY MOUTH ONCE DAILY EVERY EVENING, Disp: 90 tablet, Rfl: 3   bisoprolol  (ZEBETA ) 5 MG tablet, Take 1 tablet (5 mg total) by mouth daily., Disp: 90 tablet, Rfl: 3   dapagliflozin  propanediol (FARXIGA ) 10 MG TABS tablet, Take 1 tablet (10 mg total) by mouth daily., Disp: 30 tablet, Rfl: 11   furosemide  (LASIX ) 20 MG tablet, Take 1.5 tablets (30 mg total) by mouth daily., Disp: 45 tablet, Rfl: 6  ipratropium-albuterol  (DUONEB) 0.5-2.5 (3) MG/3ML SOLN, Inhale 3 mLs into the lungs in the morning, at noon, in the evening, and at bedtime. (Patient taking differently: Inhale 3 mLs into the lungs in the morning, at noon, in the evening, and at bedtime. As needed), Disp: 360 mL, Rfl: 11   isosorbide -hydrALAZINE  (BIDIL ) 20-37.5 MG tablet, TAKE half TABLET BY MOUTH THREE TIMES DAILY (MORNING, noon, evening), Disp: 45 tablet, Rfl: 5   metFORMIN  (GLUCOPHAGE ) 500 MG tablet, Take 1 tablet (500 mg total) by mouth 2 (two) times daily with a meal., Disp:  60 tablet, Rfl: 0   montelukast  (SINGULAIR ) 10 MG tablet, TAKE 1 Tablet BY MOUTH ONCE DAILY at bedtime (evening), Disp: 30 tablet, Rfl: 11   sacubitril -valsartan  (ENTRESTO ) 24-26 MG, Take 1 tablet by mouth 2 (two) times daily., Disp: 60 tablet, Rfl: 11   spironolactone  (ALDACTONE ) 25 MG tablet, Take 1 tablet (25 mg total) by mouth daily., Disp: 90 tablet, Rfl: 3   TRELEGY ELLIPTA 100-62.5-25 MCG/ACT AEPB, Take 1 puff by mouth daily., Disp: , Rfl:  Allergies  Allergen Reactions   Vyndamax  [Tafamidis ] Dermatitis    Allergic dermatitis/rash     Social History   Socioeconomic History   Marital status: Single    Spouse name: Not on file   Number of children: 1   Years of education: Not on file   Highest education level: 10th grade  Occupational History   Occupation: Disability  Tobacco Use   Smoking status: Some Days    Current packs/day: 0.00    Average packs/day: 0.5 packs/day for 50.0 years (25.0 ttl pk-yrs)    Types: Cigarettes    Start date: 03/19/1972    Last attempt to quit: 03/19/2022    Years since quitting: 2.0   Smokeless tobacco: Never   Tobacco comments:    Smoking cessation  Vaping Use   Vaping status: Never Used  Substance and Sexual Activity   Alcohol use: Not on file    Comment: socially   Drug use: Yes    Types: Marijuana    Comment: past   Sexual activity: Not on file  Other Topics Concern   Not on file  Social History Narrative   He lives with his daughter apparently and 5 grandchildren.  He reports that he smokes probably less than a pack of cigarettes a day.  He occasionally drinks a beer or liquor but not daily.   Social Drivers of Corporate investment banker Strain: Low Risk  (03/11/2022)   Overall Financial Resource Strain (CARDIA)    Difficulty of Paying Living Expenses: Not very hard  Food Insecurity: No Food Insecurity (01/13/2023)   Hunger Vital Sign    Worried About Running Out of Food in the Last Year: Never true    Ran Out of Food in the  Last Year: Never true  Recent Concern: Food Insecurity - Food Insecurity Present (10/26/2022)   Hunger Vital Sign    Worried About Programme researcher, broadcasting/film/video in the Last Year: Often true    Ran Out of Food in the Last Year: Often true  Transportation Needs: Unmet Transportation Needs (01/06/2024)   PRAPARE - Administrator, Civil Service (Medical): Yes    Lack of Transportation (Non-Medical): Yes  Physical Activity: Not on file  Stress: Not on file  Social Connections: Not on file  Intimate Partner Violence: Not At Risk (01/13/2023)   Humiliation, Afraid, Rape, and Kick questionnaire    Fear of Current or Ex-Partner: No  Emotionally Abused: No    Physically Abused: No    Sexually Abused: No    Physical Exam      Future Appointments  Date Time Provider Department Center  03/30/2024  2:40 PM Darlis Eisenmenger, MD [facility] None

## 2024-03-31 ENCOUNTER — Other Ambulatory Visit (HOSPITAL_COMMUNITY): Payer: Self-pay | Admitting: Cardiology

## 2024-03-31 MED ORDER — FUROSEMIDE 20 MG PO TABS: 20.0000 mg | ORAL_TABLET | ORAL | 6 refills | Status: DC

## 2024-04-11 ENCOUNTER — Other Ambulatory Visit (HOSPITAL_COMMUNITY): Payer: Self-pay | Admitting: Emergency Medicine

## 2024-04-11 ENCOUNTER — Other Ambulatory Visit: Payer: Self-pay

## 2024-04-11 NOTE — Progress Notes (Unsigned)
 Paramedicine Encounter    Patient ID: Marcus Tran, male    DOB: 09-14-1951, 73 y.o.   MRN: [id number]   Complaints NONE  Assessment A&O x 4, skin W&D w/ good color.  Denies chest pain or SOB.  Lung sounds w/ some ronchi in upper lobes  Compliance with meds YES  Pill box filled n/a  Bubble packs  Refills needed NONE  Meds changes since last visit NONE    Social changes NONE   BP 90/60 (BP Location: Left Arm, Patient Position: Sitting, Cuff Size: Normal)   Pulse 70   Resp 16   Wt 140 lb 3.2 oz (63.6 kg)   SpO2 92%   BMI 23.33 kg/m  Weight yesterday- Last visit weight-137lb  [name] sitting outside at the picnic table.  He tells me that his [facility] is out in his home.  He has called his landlord regarding repairs and nothing has been resolved yet.  I did call and speak w/ the Landlord to find out a timeline/status.  Landlord states he is looking for a company to come out and fix the unit.  In the meantime, I discussed with Marcus Tran cooling stations at the [facility] @ [address] and [employer] on [address]. [name] denies chest pain or SOB.  Lung sounds with some mild ronchi noted. No edema to his lower extremities.  He is compliant w/ his meds via bubble packs.   ACTION: Home visit completed  Marcus Tran [phone] 04/11/24  Patient Care Team: Marcus Reynolds, NP as PCP - General Marcus Agent, MD as PCP - Cardiology (Cardiology) Marcus Tran, Marcus SAUNDERS, MD as Consulting Physician (Pulmonary Disease)  Patient Active Problem List   Diagnosis Date Noted   Cocaine use 01/15/2023   Noncompliance 01/13/2023   Adenovirus pneumonia 11/12/2022   Chronic HFrEF (heart failure with reduced ejection fraction) (HCC) 08/13/2022   DM2 (diabetes mellitus, type 2) (HCC) 08/13/2022   Obstructive sleep apnea (adult) (pediatric) 08/08/2022   Acute respiratory failure with hypoxia and hypercapnia (HCC) 05/04/2022   Third degree heart block (HCC) 04/15/2022    Snoring 04/15/2022   Nonischemic cardiomyopathy (HCC) 04/15/2022   HFrEF (heart failure with reduced ejection fraction) (HCC) 04/15/2022   COPD with acute exacerbation (HCC) 03/22/2022   Chronic combined systolic and diastolic CHF (congestive heart failure) (HCC)    Tobacco use disorder 03/08/2022   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   HTN (hypertension) 03/08/2022   Stage 3a chronic kidney disease (CKD) (HCC) 03/08/2022   Coronary artery disease 03/08/2022    Current Outpatient Medications:    Acoramidis , 712MG  Twice Daily, (ATTRUBY ) 356 MG TBPK, Take 712 mg by mouth 2 (two) times daily., Disp: 112 each, Rfl: 11   albuterol  (VENTOLIN  HFA) 108 (90 Base) MCG/ACT inhaler, Inhale 2 puffs into the lungs every 4 (four) hours as needed for wheezing or shortness of breath., Disp: 1 each, Rfl: 2   atorvastatin  (LIPITOR ) 80 MG tablet, TAKE 1 Tablet BY MOUTH ONCE DAILY EVERY EVENING, Disp: 90 tablet, Rfl: 3   bisoprolol  (ZEBETA ) 5 MG tablet, Take 1 tablet (5 mg total) by mouth daily., Disp: 90 tablet, Rfl: 3   dapagliflozin  propanediol (FARXIGA ) 10 MG TABS tablet, Take 1 tablet (10 mg total) by mouth daily., Disp: 30 tablet, Rfl: 11   furosemide  (LASIX ) 20 MG tablet, Take 1 tablet (20 mg total) by mouth every other day., Disp: 30 tablet, Rfl: 6   ipratropium-albuterol  (DUONEB) 0.5-2.5 (3) MG/3ML SOLN, Inhale 3  mLs into the lungs in the morning, at noon, in the evening, and at bedtime. (Patient taking differently: Inhale 3 mLs into the lungs in the morning, at noon, in the evening, and at bedtime. As needed), Disp: 360 mL, Rfl: 11   isosorbide -hydrALAZINE  (BIDIL ) 20-37.5 MG tablet, TAKE half TABLET BY MOUTH THREE TIMES DAILY (MORNING, noon, evening), Disp: 45 tablet, Rfl: 5   metFORMIN  (GLUCOPHAGE ) 500 MG tablet, Take 1 tablet (500 mg total) by mouth 2 (two) times daily with a meal., Disp: 60 tablet, Rfl: 0   montelukast  (SINGULAIR ) 10 MG tablet, TAKE 1 Tablet BY MOUTH ONCE DAILY at bedtime  (evening), Disp: 30 tablet, Rfl: 11   sacubitril -valsartan  (ENTRESTO ) 24-26 MG, Take 1 tablet by mouth 2 (two) times daily., Disp: 60 tablet, Rfl: 11   spironolactone  (ALDACTONE ) 25 MG tablet, Take 1 tablet (25 mg total) by mouth daily., Disp: 90 tablet, Rfl: 3   TRELEGY ELLIPTA 100-62.5-25 MCG/ACT AEPB, Take 1 puff by mouth daily., Disp: , Rfl:  Allergies  Allergen Reactions   Vyndamax  [Tafamidis ] Dermatitis    Allergic dermatitis/rash     Social History   Socioeconomic History   Marital status: Single    Spouse name: Not on file   Number of children: 1   Years of education: Not on file   Highest education level: 10th grade  Occupational History   Occupation: Disability  Tobacco Use   Smoking status: Some Days    Current packs/day: 0.00    Average packs/day: 0.5 packs/day for 50.0 years (25.0 ttl pk-yrs)    Types: Cigarettes    Start date: 03/19/1972    Last attempt to quit: 03/19/2022    Years since quitting: 2.0   Smokeless tobacco: Never   Tobacco comments:    Smoking cessation  Vaping Use   Vaping status: Never Used  Substance and Sexual Activity   Alcohol use: Not on file    Comment: socially   Drug use: Yes    Types: Marijuana    Comment: past   Sexual activity: Not on file  Other Topics Concern   Not on file  Social History Narrative   He lives with his daughter apparently and 5 grandchildren.  He reports that he smokes probably less than a pack of cigarettes a day.  He occasionally drinks a beer or liquor but not daily.   Social Drivers of Corporate investment banker Strain: Low Risk  (03/11/2022)   Overall Financial Resource Strain (CARDIA)    Difficulty of Paying Living Expenses: Not very hard  Food Insecurity: No Food Insecurity (01/13/2023)   Hunger Vital Sign    Worried About Running Out of Food in the Last Year: Never true    Ran Out of Food in the Last Year: Never true  Recent Concern: Food Insecurity - Food Insecurity Present (10/26/2022)   Hunger  Vital Sign    Worried About Programme researcher, broadcasting/film/video in the Last Year: Often true    Ran Out of Food in the Last Year: Often true  Transportation Needs: Unmet Transportation Needs (01/06/2024)   PRAPARE - Administrator, Civil Service (Medical): Yes    Lack of Transportation (Non-Medical): Yes  Physical Activity: Not on file  Stress: Not on file  Social Connections: Not on file  Intimate Partner Violence: Not At Risk (01/13/2023)   Humiliation, Afraid, Rape, and Kick questionnaire    Fear of Current or Ex-Partner: No    Emotionally Abused: No  Physically Abused: No    Sexually Abused: No    Physical Exam      Future Appointments  Date Time Provider Department Center  05/04/2024 10:40 AM Rolan Ezra RAMAN, MD [facility] None

## 2024-04-13 ENCOUNTER — Telehealth: Payer: Self-pay | Admitting: Licensed Clinical Social Worker

## 2024-04-13 ENCOUNTER — Other Ambulatory Visit: Payer: Self-pay

## 2024-04-13 NOTE — Telephone Encounter (Addendum)
 H&V Care Navigation CSW Progress Note  Clinical Social Worker received a call from [employer], community paramedic, to inquire about resources as pt air conditioning has gone out. Landlord has been contacted about repairing it but hasn't been able to yet- we discussed pt may be able to get a fan through [employer], I have reached out to [place] with [employer] at [employer] to gauge any availability, if not will discuss if clinic may be able to assist with this purchase.    [place] returned my email and let me know fans available. Dede will need to bring copy of pt ID so that they can verify as pt must be 60+ to receive assistance. Dede aware and will let me know if any issues.   Patient is participating in a Managed Medicaid Plan:  No, UHC Medicare  SDOH Screenings   Food Insecurity: No Food Insecurity (01/13/2023)  Recent Concern: Food Insecurity - Food Insecurity Present (10/26/2022)  Housing: Medium Risk (09/24/2023)  Transportation Needs: Unmet Transportation Needs (01/06/2024)  Utilities: Not At Risk (01/13/2023)  Alcohol Screen: Low Risk  (05/06/2022)  Financial Resource Strain: Low Risk  (03/11/2022)  Tobacco Use: High Risk (01/03/2024)     Marit Lark, MSW, LCSW Clinical Social Worker II [facility] Heart/Vascular Care Navigation  [phone]- work cell phone (preferred)

## 2024-04-14 ENCOUNTER — Telehealth (HOSPITAL_COMMUNITY): Payer: Self-pay | Admitting: Emergency Medicine

## 2024-04-14 ENCOUNTER — Other Ambulatory Visit: Payer: Self-pay

## 2024-04-17 ENCOUNTER — Other Ambulatory Visit: Payer: Self-pay | Admitting: Pulmonary Disease

## 2024-04-17 ENCOUNTER — Other Ambulatory Visit: Payer: Self-pay

## 2024-04-17 ENCOUNTER — Other Ambulatory Visit (HOSPITAL_COMMUNITY): Payer: Self-pay | Admitting: Cardiology

## 2024-04-17 NOTE — Telephone Encounter (Signed)
 LVM w/ Mr. Schult to call me regarding getting a copy of his ID to attempt to get a fan due AC not working.    Mary Sharps, EMT-Paramedic [phone] 04/17/2024

## 2024-04-17 NOTE — Progress Notes (Signed)
 Specialty Pharmacy Refill Coordination Note  Marcus Tran is a 73 y.o. male contacted today regarding refills of specialty medication(s) Acoramidis  HCl (Attruby )   Patient requested Delivery   Delivery date: 04/19/24   Verified address: [address]  [place] [address]   Medication will be filled on 04/18/24.

## 2024-04-18 ENCOUNTER — Telehealth: Payer: Self-pay | Admitting: Licensed Clinical Social Worker

## 2024-04-18 NOTE — Telephone Encounter (Signed)
 H&V Care Navigation CSW Progress Note  Clinical Social Worker contacted patient by phone to f/u on fan request/broken utilities. No answer today, left voicemail at [phone] to ensure pt was able to get his fan from [employer] and provide any additional assistance.  Patient is participating in a Managed Medicaid Plan:  No, UHC Medicare Dual Complete  SDOH Screenings   Food Insecurity: No Food Insecurity (01/13/2023)  Recent Concern: Food Insecurity - Food Insecurity Present (10/26/2022)  Housing: Medium Risk (09/24/2023)  Transportation Needs: Unmet Transportation Needs (01/06/2024)  Utilities: Not At Risk (01/13/2023)  Alcohol Screen: Low Risk  (05/06/2022)  Financial Resource Strain: Low Risk  (03/11/2022)  Tobacco Use: High Risk (01/03/2024)    Marit Lark, MSW, LCSW Clinical Social Worker II [facility] Heart/Vascular Care Navigation  [phone]- work cell phone (preferred)

## 2024-04-20 ENCOUNTER — Telehealth: Payer: Self-pay | Admitting: Licensed Clinical Social Worker

## 2024-04-20 NOTE — Telephone Encounter (Signed)
 H&V Care Navigation CSW Progress Note  Clinical Social Worker contacted patient by phone to f/u on fan request/broken utilities. No answer today, left 2nd voicemail at [phone] to ensure pt was able to get his fan from [employer] and provide any additional assistance.   Patient is participating in a Managed Medicaid Plan:  No, UHC Medicare Dual Complete  SDOH Screenings   Food Insecurity: No Food Insecurity (01/13/2023)  Recent Concern: Food Insecurity - Food Insecurity Present (10/26/2022)  Housing: Medium Risk (09/24/2023)  Transportation Needs: Unmet Transportation Needs (01/06/2024)  Utilities: Not At Risk (01/13/2023)  Alcohol Screen: Low Risk  (05/06/2022)  Financial Resource Strain: Low Risk  (03/11/2022)  Tobacco Use: High Risk (01/03/2024)    Marit Lark, MSW, LCSW Clinical Social Worker II [facility] Heart/Vascular Care Navigation  [phone]- work cell phone (preferred)

## 2024-04-26 NOTE — Telephone Encounter (Signed)
 Opened in error

## 2024-04-27 ENCOUNTER — Other Ambulatory Visit (HOSPITAL_COMMUNITY): Payer: Self-pay | Admitting: Emergency Medicine

## 2024-04-27 NOTE — Progress Notes (Signed)
 Paramedicine Encounter    Patient ID: Marcus Tran, male    DOB: October 13, 1951, 73 y.o.   MRN: [id number]   Complaints NONE  Assessment A&O x 4, skin W&D w/ good color. Denies chest pain or SOB.  Lung sounds clear bilat.  No peripheral edema noted.  Compliance with meds YES  Pill box filled n/a  Bubble Packs  Refills needed NONE  Meds changes since last visit NONE    Social changes NONE   BP 116/80 (BP Location: Left Arm, Patient Position: Sitting, Cuff Size: Normal)   Pulse 80   Resp 16   Wt 131 lb 9.6 oz (59.7 kg)   SpO2 98%   BMI 21.90 kg/m  Weight yesterday-not taken Last visit weight-140lb   ACTION: Home visit completed  [name] [phone] 04/28/24  Patient Care Team: [name], NP as PCP - General Lavona Agent, MD as PCP - Cardiology (Cardiology) Hunsucker, Donnice SAUNDERS, MD as Consulting Physician (Pulmonary Disease)  Patient Active Problem List   Diagnosis Date Noted   Cocaine use 01/15/2023   Noncompliance 01/13/2023   Adenovirus pneumonia 11/12/2022   Chronic HFrEF (heart failure with reduced ejection fraction) (HCC) 08/13/2022   DM2 (diabetes mellitus, type 2) (HCC) 08/13/2022   Obstructive sleep apnea (adult) (pediatric) 08/08/2022   Acute respiratory failure with hypoxia and hypercapnia (HCC) 05/04/2022   Third degree heart block (HCC) 04/15/2022   Snoring 04/15/2022   Nonischemic cardiomyopathy (HCC) 04/15/2022   HFrEF (heart failure with reduced ejection fraction) (HCC) 04/15/2022   COPD with acute exacerbation (HCC) 03/22/2022   Chronic combined systolic and diastolic CHF (congestive heart failure) (HCC)    Tobacco use disorder 03/08/2022   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   HTN (hypertension) 03/08/2022   Stage 3a chronic kidney disease (CKD) (HCC) 03/08/2022   Coronary artery disease 03/08/2022    Current Outpatient Medications:    Acoramidis , 712MG  Twice Daily, (ATTRUBY ) 356 MG TBPK, Take 712 mg by  mouth 2 (two) times daily., Disp: 112 each, Rfl: 11   albuterol  (VENTOLIN  HFA) 108 (90 Base) MCG/ACT inhaler, Inhale 2 puffs into the lungs every 4 (four) hours as needed for wheezing or shortness of breath., Disp: 1 each, Rfl: 2   atorvastatin  (LIPITOR ) 80 MG tablet, TAKE 1 Tablet BY MOUTH ONCE DAILY EVERY EVENING, Disp: 90 tablet, Rfl: 3   bisoprolol  (ZEBETA ) 5 MG tablet, Take 1 Tablet by mouth once daily (morning), Disp: 90 tablet, Rfl: 3   dapagliflozin  propanediol (FARXIGA ) 10 MG TABS tablet, Take 1 tablet (10 mg total) by mouth daily., Disp: 30 tablet, Rfl: 11   furosemide  (LASIX ) 20 MG tablet, Take 1 tablet (20 mg total) by mouth every other day., Disp: 30 tablet, Rfl: 6   ipratropium-albuterol  (DUONEB) 0.5-2.5 (3) MG/3ML SOLN, Inhale 3 mLs into the lungs in the morning, at noon, in the evening, and at bedtime. (Patient taking differently: Inhale 3 mLs into the lungs in the morning, at noon, in the evening, and at bedtime. As needed), Disp: 360 mL, Rfl: 11   isosorbide -hydrALAZINE  (BIDIL ) 20-37.5 MG tablet, TAKE half TABLET BY MOUTH THREE TIMES DAILY (MORNING, noon, evening), Disp: 45 tablet, Rfl: 5   metFORMIN  (GLUCOPHAGE ) 500 MG tablet, Take 1 tablet (500 mg total) by mouth 2 (two) times daily with a meal., Disp: 60 tablet, Rfl: 0   montelukast  (SINGULAIR ) 10 MG tablet, TAKE 1 Tablet BY MOUTH ONCE DAILY at bedtime (evening), Disp: 30 tablet, Rfl: 11   sacubitril -valsartan  (ENTRESTO ) 24-26 MG, Take 1  tablet by mouth 2 (two) times daily., Disp: 60 tablet, Rfl: 11   spironolactone  (ALDACTONE ) 25 MG tablet, Take 1 tablet (25 mg total) by mouth daily., Disp: 90 tablet, Rfl: 3   TRELEGY ELLIPTA 100-62.5-25 MCG/ACT AEPB, Take 1 puff by mouth daily., Disp: , Rfl:  Allergies  Allergen Reactions   Vyndamax  [Tafamidis ] Dermatitis    Allergic dermatitis/rash     Social History   Socioeconomic History   Marital status: Single    Spouse name: Not on file   Number of children: 1   Years of  education: Not on file   Highest education level: 10th grade  Occupational History   Occupation: Disability  Tobacco Use   Smoking status: Some Days    Current packs/day: 0.00    Average packs/day: 0.5 packs/day for 50.0 years (25.0 ttl pk-yrs)    Types: Cigarettes    Start date: 03/19/1972    Last attempt to quit: 03/19/2022    Years since quitting: 2.1   Smokeless tobacco: Never   Tobacco comments:    Smoking cessation  Vaping Use   Vaping status: Never Used  Substance and Sexual Activity   Alcohol use: Not on file    Comment: socially   Drug use: Yes    Types: Marijuana    Comment: past   Sexual activity: Not on file  Other Topics Concern   Not on file  Social History Narrative   He lives with his daughter apparently and 5 grandchildren.  He reports that he smokes probably less than a pack of cigarettes a day.  He occasionally drinks a beer or liquor but not daily.   Social Drivers of Corporate investment banker Strain: Low Risk  (03/11/2022)   Overall Financial Resource Strain (CARDIA)    Difficulty of Paying Living Expenses: Not very hard  Food Insecurity: No Food Insecurity (01/13/2023)   Hunger Vital Sign    Worried About Running Out of Food in the Last Year: Never true    Ran Out of Food in the Last Year: Never true  Recent Concern: Food Insecurity - Food Insecurity Present (10/26/2022)   Hunger Vital Sign    Worried About Running Out of Food in the Last Year: Often true    Ran Out of Food in the Last Year: Often true  Transportation Needs: Unmet Transportation Needs (01/06/2024)   PRAPARE - Administrator, Civil Service (Medical): Yes    Lack of Transportation (Non-Medical): Yes  Physical Activity: Not on file  Stress: Not on file  Social Connections: Not on file  Intimate Partner Violence: Not At Risk (01/13/2023)   Humiliation, Afraid, Rape, and Kick questionnaire    Fear of Current or Ex-Partner: No    Emotionally Abused: No    Physically Abused: No     Sexually Abused: No    Physical Exam      Future Appointments  Date Time Provider Department Center  05/04/2024 10:40 AM Rolan Ezra RAMAN, MD [facility] None

## 2024-05-03 ENCOUNTER — Telehealth (HOSPITAL_COMMUNITY): Payer: Self-pay | Admitting: Cardiology

## 2024-05-03 NOTE — Telephone Encounter (Signed)
 Called to confirm/remind patient of their appointment at the [facility] on 05/03/2024.   Appointment:   [] Confirmed  [x] Left mess   [] No answer/No voice mail  [] VM Full/unable to leave message  [] Phone not in service  Patient reminded to bring all medications and/or complete list.  Confirmed patient has transportation. Gave directions, instructed to utilize valet parking.

## 2024-05-04 ENCOUNTER — Ambulatory Visit (HOSPITAL_COMMUNITY)
Admission: RE | Admit: 2024-05-04 | Discharge: 2024-05-04 | Disposition: A | Discharge status: Home / Self Care | Source: Ambulatory Visit | Attending: Cardiology | Admitting: Cardiology

## 2024-05-04 ENCOUNTER — Encounter (HOSPITAL_COMMUNITY): Payer: Self-pay | Admitting: Cardiology

## 2024-05-04 ENCOUNTER — Other Ambulatory Visit (HOSPITAL_COMMUNITY): Payer: Self-pay | Admitting: Emergency Medicine

## 2024-05-04 VITALS — BP 108/70 | HR 66

## 2024-05-04 DIAGNOSIS — I5042 Chronic combined systolic (congestive) and diastolic (congestive) heart failure: Secondary | ICD-10-CM

## 2024-05-04 NOTE — Progress Notes (Signed)
 Heart Failure TeleHealth Note  See MyChart message from today for patient consent regarding telehealth for [facility].  Date:  05/04/2024   ID:  Marcus Tran, DOB [birth year], MRN [id number]  Location: Home  Provider location: [facility] Type of Visit: Established patient   PCP:  [name], NP  Cardiologist:  Lynwood Schilling, MD Primary HF: Dr. Rolan  Chief Complaint: CHF   History of Present Illness: Marcus Tran is a 73 y.o. male hx tobacco use, COPD, OSA on Bipap, HTN, and chronic HFmrEF.   Admitted 5/23 with acute hypoxic respiratory failure secondary to suspected COPD and acute systolic CHF. Initially in respiratory distress and required BiPAP. BP significantly elevated at 189/133, remained elevated throughout much of admission but improved with titration of GDMT. CTA chest negative for PE. Echo EF 35%, RV okay, RVSP 54 mmHg. R/LHC showed nonobstructive CAD; RA mean 12, PA 54/29 (37), PCWP mean 23, LVEDP 22 mmHg, Fick CO/CI 3.84/2.12. He diuresed with IV lasix .    He was readmitted 6/23 with acute on chronic respiratory failure with hypoxia secondary to acute COPD exacerbation. Also thought to possibly have some component of CHF. He diuresed with IV lasix  and received doxycycline  + prednisone . Discharge weight 151 lb.    Zio monitor in 6/23 showed NSR with short runs NSVT and SVT, bradyarrhythmias, idioventricular rhythm, with episodes of third-degree HB noted during sleep hours.  Bradycardia thought to be due to untreated severe OSA.   Admitted 7/23 with acute respiratory failure with hypoxia. O2 sats 80s. He initially required BiPAP. Hypertensive with BP 210/120 and placed on nitro gtt. He was admitted for AECOPD and CHF. Diuresed with IV lasix . Cardiology consulted and felt presentation more likely consistent with AECOPD rather than CHF. Had runs of NSVT up to 20 beats in duration.    Echo 8/23 EF improved 40-45% with LVH. Recommended PYP/ CMRI    Sleep study 9/23 showed severe OSA with AHI of 42.6/hr. He declined in lab BiPap titration.   ED evaluation 10/16/22 for syncope. Low suspicion for PE. CXR ok. COVID negative. K 5.5. Suspected vasovagal episode during coughing fit.     Spironolactone /KCL stopped with recent hyperkalemia.    Admitted 1/24 for AECOPD. Transiently required BiPap, then able to wean to nasal cannula. Found to have AKI and GDMT initially held then restarted per home regimen. Started on prednisone  taper. He was discharged home, weight 155 lbs.   Admitted 3/24 with COPD exacerbation. Required BiPap and steroids. Started on IV Solu-Medrol . UDS + cocaine. Spiro and Entresto  stopped with AKI.     Had PYP scan 04/09/23 --> Stongly suggestive of TTR. He was started on tafamidis  but developed severe rash/dermatitis after starting that improved when he stopped and came back when he restarted. He is now off tafamidis .    Echo 10/24 showed EF 40% with moderate LVH, global hypokinesis, mild RV enlargement with mildly decreased systolic function, IVC normal.    Seen in UC s/p fall 09/14/23. Underwent R distal thumb reduction.   cMRI 12/24 LVEF 40%, RVEF 46%, elevated ECV 40% consistent with cardiac amyloidosis, extensive mid-wall LGE in the basal to mid septum, subepicardial LGE in the basal to mid inferior wall, and patchy mid-wall LGE in the basal to mid anterior wall.   His paramedic is at his house during this conversation to obtain vitals.  Weight has been stable.  He has stable dyspnea with heavy activity (COPD likely plays a role), does fine  with basic ADLs.  No chest pain.  No lightheadedness.  He is still smoking some but has had to cut back due to the expense.  He has been using CPAP but does not like it.    Labs (7/24): urine immunofixation negative, myeloma panel negative Labs (9/24): BNP 553, K 3.5, creatinine 1.75 Labs (10/24): K 4.1, creatinine 1.62 Labs (1/25): K 4.2, creatinine 3.9, LDL 55 Labs (3/25): K 4.4,  creatinine 1.85, BNP 71   PMH: 1. OSA: Severe. CPAP. 2. COPD 3. HTN 4. 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  - R/LHC (5/23): nonobstructive CAD; RA mean 12, PA 54/29 (37), PCWP mean 23, LVEDP 22 mmHg, Fick CO/CI 3.84/2.12. - Echo (5/23): EF 35%, RV okay, RVSP 54 mmHg. - Echo (8/23): EF 40-45% w/ LVH - Echo (10/24): EF 40% with moderate LVH, global hypokinesis, mild RV enlargement with mildly decreased systolic function, IVC normal.  - PYP scan (6/24): Grade 2, H/CL 1.3.  Looks abnormal visually.   - [employer] gene testing negative for TTR mutations.  - cMRI 12/24 LVEF 40%, RVEF 46%, elevated ECV 40% consistent with cardiac amyloidosis, extensive mid-wall LGE in the basal to mid septum, subepicardial LGE in the basal to mid inferior wall, and patchy mid-wall LGE in the basal to mid anterior wall. 5. Hyperlipidemia 6. Bradycardia: Complete HB noted while sleeping, thought to be due to severe OSA.  7. CAD: LHC (5/23) with 50% OM1 stenosis.  8. Syncope: ?vasovagal 9. Type 2 diabetes.  10. CKD stage 3  Current Outpatient Medications  Medication Sig Dispense Refill   Acoramidis , 712MG  Twice Daily, (ATTRUBY ) 356 MG TBPK Take 712 mg by mouth 2 (two) times daily. 112 each 11   albuterol  (VENTOLIN  HFA) 108 (90 Base) MCG/ACT inhaler Inhale 2 puffs into the lungs every 4 (four) hours as needed for wheezing or shortness of breath. 1 each 2   atorvastatin  (LIPITOR ) 80 MG tablet TAKE 1 Tablet BY MOUTH ONCE DAILY EVERY EVENING 90 tablet 3   bisoprolol  (ZEBETA ) 5 MG tablet Take 1 Tablet by mouth once daily (morning) 90 tablet 3   dapagliflozin  propanediol (FARXIGA ) 10 MG TABS tablet Take 1 tablet (10 mg total) by mouth daily. 30 tablet 11   furosemide  (LASIX ) 20 MG tablet Take 1 tablet (20 mg total) by mouth every other day. 30 tablet 6   ipratropium-albuterol  (DUONEB) 0.5-2.5 (3) MG/3ML SOLN Take 3 mLs by nebulization as needed.      isosorbide -hydrALAZINE  (BIDIL ) 20-37.5 MG tablet TAKE half TABLET BY MOUTH THREE TIMES DAILY (MORNING, noon, evening) 45 tablet 5   metFORMIN  (GLUCOPHAGE ) 500 MG tablet Take 1 tablet (500 mg total) by mouth 2 (two) times daily with a meal. 60 tablet 0   montelukast  (SINGULAIR ) 10 MG tablet TAKE 1 Tablet BY MOUTH ONCE DAILY at bedtime (evening) 30 tablet 11   sacubitril -valsartan  (ENTRESTO ) 24-26 MG Take 1 tablet by mouth 2 (two) times daily. 60 tablet 11   spironolactone  (ALDACTONE ) 25 MG tablet Take 1 tablet (25 mg total) by mouth daily. 90 tablet 3   TRELEGY ELLIPTA 100-62.5-25 MCG/ACT AEPB Take 1 puff by mouth daily.     No current facility-administered medications for this encounter.    Allergies:   Vyndamax  [tafamidis ]   Social History:  The patient  reports that he has been smoking cigarettes. He started smoking about 52 years ago. He has a 25 pack-year smoking history. He has never used smokeless tobacco. He reports current drug use.  Drug: Marijuana.   Family History:  The patient's family history includes Hypertension in his mother.   ROS:  Please see the history of present illness.   All other systems are personally reviewed and negative.   Exam:  (Video/Tele Health Call; Exam is subjective and or/visual.) BP 108/70, HR 66 (from paramedic at his house) General:  Speaks in full sentences. No resp difficulty. Lungs: Normal respiratory effort with conversation.  Abdomen: Non-distended per patient report Extremities: Pt denies edema. Neuro: Alert & oriented x 3.   Recent Labs: 01/03/2024: B Natriuretic Peptide 70.7 01/11/2024: BUN 23; Creatinine, Ser 1.85; Potassium 4.4; Sodium 132  Personally reviewed   Wt Readings from Last 3 Encounters:  04/27/24 59.7 kg (131 lb 9.6 oz)  04/11/24 63.6 kg (140 lb 3.2 oz)  03/30/24 62.4 kg (137 lb 9.6 oz)      ASSESSMENT AND PLAN:  1.  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 Echo (5/23)  with LVEF 35%, RV okay.  [facility] 5/23 showed nonobstructive CAD; RA mean 12, PA 54/29 (37), PCWP mean 23, LVEDP 22 mmHg. Echo in 8/23 with EF 40-45%.  Echo in 10/24 showed EF 40% with moderate LVH, global hypokinesis, mild RV enlargement with mildly decreased systolic function, IVC normal. PYP scan concerning for TTR cardiac amyloidosis. The PYP scan was not read as floridly positive, but visually appeared positive. Myeloma studies were negative.  [employer] gene testing negative for TTR gene mutations.  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   He did not tolerate tafamidis  due to severe rash and was transitioned to acoramidis .  Cardiac MRI was done in 12/24, showing LVEF 40%, RVEF 46%, elevated ECV 40% consistent with cardiac amyloidosis, extensive mid-wall LGE in the basal to mid septum, subepicardial LGE in the basal to mid inferior wall, and patchy mid-wall LGE in the basal to mid anterior wall. Elevated ECV percentage on cardiac MRI was consistent with cardiac amyloidosis though LGE pattern was not classic for amyloidosis and could also be seen with cardiac sarcoidosis or an arrhythmogenic cardiomyopathy.  NYHA class II with stable weight. With relatively low BP and CKD stage 3, will hold off on medication titration.  - I will arrange for cardiac PET to assess for cardiac sarcoidosis, will also get HRCT chest to look for evidence of pulmonary sarcoidosis.  When I see him back in the office, would be reasonable to get Prevention genetic testing for common cardiomyopathies.  If questions remain about his diagnosis after these studies, endomyocardial biopsy would be appropriate.  - He is now on acoramidis . - Continue spironolactone  25 mg daily. He will come to the office for BMET/BNP.  - Continue Entresto  24/26 mg bid.  - Continue Lasix  20 mg every other day.  - Continue Farxiga  10 mg daily.  - Continue bisoprolol  5 mg daily, low threshold to cut back with history of bradycardia. - Continue BiDil   0.5 tab tid. 2. Syncope:  Multiple episodes in the past. Evaluated in the ED 12/23. Event felt to by vasovagal.  2 week Zio (6/23) showed mostly NSR, significant bradycardia during sleeping hours w/ intermittent CHB thought to be due to severe OSA. No recent recurrence.  - Low threshold to cut back on bisoprolol .  - Continue to use CPAP.  3. HTN: BP stable - Continue current meds. 4. OSA: Severe by sleep study.   - Continue to use CPAP.  5. CAD: Nonobstructive on [facility] 5/23.  No chest pain.  - Continue aspirin   and statin, check lipids today.  6. COPD: Still smoking. Has history of COPD exacerbations though minimally symptomatic at this time.  - Discussed smoking cessation.  - On Trelegy. Followed by Pulmonary  Relevant cardiac medications were reviewed at length with the patient today. The patient does not have concerns regarding their medications at this time.   Recommended follow-up:  6 wks with APP  Today, I have spent 31 minutes with the patient with telehealth technology discussing the above issues .    Signed, Ezra Shuck, MD  05/04/2024 4:05 PM  [facility] [address] [facility] [place] [address] [phone] (office) [phone] (fax)

## 2024-05-04 NOTE — Progress Notes (Signed)
 Paramedicine Encounter   Patient ID: Marcus Tran , male,   DOB: 11/26/1950,73 y.o.,  MRN: [id number]   Video visit with provider.    B/P-108/70 [id number]   Med changes today (if any) : NONE  Dr. Rolan discussed w/ Marcus Tran having a CAT Scan to rule out sarcoidosis and a Cardiac PET scan.  Once these procedures are pre authorized someone will call him to schedule these appointments.   Mary Sharps, EMT-Paramedic [phone] 05/04/2024

## 2024-05-05 ENCOUNTER — Other Ambulatory Visit: Payer: Self-pay

## 2024-05-05 NOTE — Progress Notes (Signed)
 Specialty Pharmacy Refill Coordination Note  Marcus Tran is a 73 y.o. male contacted today regarding refills of specialty medication(s) Acoramidis  HCl (Attruby )  Spoke with daughter Marcus Tran  Patient requested Delivery   Delivery date: 05/12/24   Verified address: [address]  [place] [id number]   Medication will be filled on 07.24.25.

## 2024-05-11 ENCOUNTER — Other Ambulatory Visit: Payer: Self-pay

## 2024-05-16 ENCOUNTER — Other Ambulatory Visit: Payer: Self-pay | Admitting: Pulmonary Disease

## 2024-05-17 ENCOUNTER — Other Ambulatory Visit (HOSPITAL_COMMUNITY): Payer: Self-pay | Admitting: Emergency Medicine

## 2024-05-17 ENCOUNTER — Other Ambulatory Visit: Payer: Self-pay | Admitting: Pulmonary Disease

## 2024-05-17 MED ORDER — MONTELUKAST SODIUM 10 MG PO TABS: 10.0000 mg | ORAL_TABLET | Freq: Every day | ORAL | 2 refills | Status: DC

## 2024-05-17 NOTE — Progress Notes (Deleted)
 Montelukast  refilled, please schedule f/u visit if additional refills desired.

## 2024-05-17 NOTE — Progress Notes (Signed)
 Paramedicine Encounter    Patient ID: Marcus Tran, male    DOB: 01-Aug-1951, 73 y.o.   MRN: [id number]   Complaints none  Assessment A&O x 4, skin W&D w/ good color.  Denies chest pain or SOB.  Lung sounds clear throughout.  No peripheral edema noted.  Compliance with meds YES  Pill box filled n/a  Bubble packs  Refills needed New bubble packs to be delivered Monday 05/22/24  Meds changes since last visit NONE    Social changes NONE   BP (!) 80/60 (BP Location: Right Arm, Patient Position: Sitting, Cuff Size: Normal)   Pulse 76   Wt 135 lb 9.6 oz (61.5 kg)   SpO2 93%   BMI 22.57 kg/m   Pt w/o compliant.  Advised him of PET CT set for 06/01/24.  ACTION: Home visit completed  [name] [phone] 05/23/24  Patient Care Team: [name], NP as PCP - General Lavona Agent, MD as PCP - Cardiology (Cardiology) Rolan Ezra RAMAN, MD as PCP - Advanced Heart Failure (Cardiology) Hunsucker, Donnice SAUNDERS, MD as Consulting Physician (Pulmonary Disease)  Patient Active Problem List   Diagnosis Date Noted   Cocaine use 01/15/2023   Noncompliance 01/13/2023   Adenovirus pneumonia 11/12/2022   Chronic HFrEF (heart failure with reduced ejection fraction) (HCC) 08/13/2022   DM2 (diabetes mellitus, type 2) (HCC) 08/13/2022   Obstructive sleep apnea (adult) (pediatric) 08/08/2022   Acute respiratory failure with hypoxia and hypercapnia (HCC) 05/04/2022   Third degree heart block (HCC) 04/15/2022   Snoring 04/15/2022   Nonischemic cardiomyopathy (HCC) 04/15/2022   HFrEF (heart failure with reduced ejection fraction) (HCC) 04/15/2022   COPD with acute exacerbation (HCC) 03/22/2022   Chronic combined systolic and diastolic CHF (congestive heart failure) (HCC)    Tobacco use disorder 03/08/2022   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   HTN (hypertension) 03/08/2022   Stage 3a chronic kidney disease (CKD) (HCC) 03/08/2022   Coronary artery disease  03/08/2022    Current Outpatient Medications:    Acoramidis , 712MG  Twice Daily, (ATTRUBY ) 356 MG TBPK, Take 712 mg by mouth 2 (two) times daily., Disp: 112 each, Rfl: 11   albuterol  (VENTOLIN  HFA) 108 (90 Base) MCG/ACT inhaler, Inhale 2 puffs into the lungs every 4 (four) hours as needed for wheezing or shortness of breath., Disp: 1 each, Rfl: 2   atorvastatin  (LIPITOR ) 80 MG tablet, TAKE 1 Tablet BY MOUTH ONCE DAILY EVERY EVENING, Disp: 90 tablet, Rfl: 3   bisoprolol  (ZEBETA ) 5 MG tablet, Take 1 Tablet by mouth once daily (morning), Disp: 90 tablet, Rfl: 3   dapagliflozin  propanediol (FARXIGA ) 10 MG TABS tablet, Take 1 tablet (10 mg total) by mouth daily., Disp: 30 tablet, Rfl: 11   furosemide  (LASIX ) 20 MG tablet, Take 1 tablet (20 mg total) by mouth every other day., Disp: 30 tablet, Rfl: 6   ipratropium-albuterol  (DUONEB) 0.5-2.5 (3) MG/3ML SOLN, Take 3 mLs by nebulization as needed., Disp: , Rfl:    isosorbide -hydrALAZINE  (BIDIL ) 20-37.5 MG tablet, TAKE half TABLET BY MOUTH THREE TIMES DAILY (MORNING, noon, evening), Disp: 45 tablet, Rfl: 5   metFORMIN  (GLUCOPHAGE ) 500 MG tablet, Take 1 tablet (500 mg total) by mouth 2 (two) times daily with a meal., Disp: 60 tablet, Rfl: 0   sacubitril -valsartan  (ENTRESTO ) 24-26 MG, Take 1 tablet by mouth 2 (two) times daily., Disp: 60 tablet, Rfl: 11   spironolactone  (ALDACTONE ) 25 MG tablet, Take 1 tablet (25 mg total) by mouth daily., Disp: 90 tablet, Rfl: 3  TRELEGY ELLIPTA 100-62.5-25 MCG/ACT AEPB, Take 1 puff by mouth daily., Disp: , Rfl:    montelukast  (SINGULAIR ) 10 MG tablet, Take 1 tablet (10 mg total) by mouth at bedtime., Disp: 30 tablet, Rfl: 2 Allergies  Allergen Reactions   Vyndamax  [Tafamidis ] Dermatitis    Allergic dermatitis/rash     Social History   Socioeconomic History   Marital status: Single    Spouse name: Not on file   Number of children: 1   Years of education: Not on file   Highest education level: 10th grade   Occupational History   Occupation: Disability  Tobacco Use   Smoking status: Some Days    Current packs/day: 0.00    Average packs/day: 0.5 packs/day for 50.0 years (25.0 ttl pk-yrs)    Types: Cigarettes    Start date: 03/19/1972    Last attempt to quit: 03/19/2022    Years since quitting: 2.1   Smokeless tobacco: Never   Tobacco comments:    Smoking cessation  Vaping Use   Vaping status: Never Used  Substance and Sexual Activity   Alcohol use: Not on file    Comment: socially   Drug use: Yes    Types: Marijuana    Comment: past   Sexual activity: Not on file  Other Topics Concern   Not on file  Social History Narrative   He lives with his daughter apparently and 5 grandchildren.  He reports that he smokes probably less than a pack of cigarettes a day.  He occasionally drinks a beer or liquor but not daily.   Social Drivers of Corporate investment banker Strain: Low Risk  (03/11/2022)   Overall Financial Resource Strain (CARDIA)    Difficulty of Paying Living Expenses: Not very hard  Food Insecurity: No Food Insecurity (01/13/2023)   Hunger Vital Sign    Worried About Running Out of Food in the Last Year: Never true    Ran Out of Food in the Last Year: Never true  Recent Concern: Food Insecurity - Food Insecurity Present (10/26/2022)   Hunger Vital Sign    Worried About Running Out of Food in the Last Year: Often true    Ran Out of Food in the Last Year: Often true  Transportation Needs: Unmet Transportation Needs (01/06/2024)   PRAPARE - Administrator, Civil Service (Medical): Yes    Lack of Transportation (Non-Medical): Yes  Physical Activity: Not on file  Stress: Not on file  Social Connections: Not on file  Intimate Partner Violence: Not At Risk (01/13/2023)   Humiliation, Afraid, Rape, and Kick questionnaire    Fear of Current or Ex-Partner: No    Emotionally Abused: No    Physically Abused: No    Sexually Abused: No    Physical Exam      Future  Appointments  Date Time Provider Department Center  06/01/2024 10:00 AM [facility] 2 [facility] [place]  06/14/2024 10:00 AM [facility] PA/NP [facility] None  06/22/2024  7:40 AM [facility] PET CT 1 [facility] [place]

## 2024-05-23 ENCOUNTER — Encounter (HOSPITAL_COMMUNITY): Payer: Self-pay

## 2024-05-26 ENCOUNTER — Other Ambulatory Visit (HOSPITAL_COMMUNITY): Payer: Self-pay | Admitting: Emergency Medicine

## 2024-05-26 NOTE — Progress Notes (Signed)
 Paramedicine Encounter    Patient ID: Marcus Tran, male    DOB: 1951/03/25, 73 y.o.   MRN: [id number]   Complaints NONE  Assessment A&O x 4, skin W&D w/ good color.  Denies chest pain or SOB.  Lung sounds clear and equal bilat. No peripheral edema noted  Compliance with meds YES  Pill box filled Bubble packs  Refills needed NONE  Meds changes since last visit NONE    Social changes NONE   BP (!) 80/70 (BP Location: Right Arm, Patient Position: Sitting, Cuff Size: Normal)   Pulse 70   Resp 16   Wt 136 lb 9.6 oz (62 kg)   SpO2 93%   BMI 22.73 kg/m  Weight yesterday- not taken Last visit weight-135.6lb   Today's visit finds Mr. Palos in good spirits and doing well.  Visits are held outside at the picnic table due to roach infestation in the home. Pill packs reviewed.  He is doing well with med compliance.  He denies chest pain or SOB. Lung sounds with some expiratory wheezes.  He says he has been smoking more as of late and we discussed cutting back and/or quitting all together.   Mr. Schroeter has some upcoming appointments which I reviewed w/ him and wrote down a list for him to keep.  He has a CT appointment 8/14 @ 10:00.  I spoke with his daugther Crystal and she agreed that she could take him to this appointment. Goal is to discharge him at the end of this month.  ACTION: Home visit completed  [name] [phone] 05/26/24  Patient Care Team: [name], NP as PCP - General Lavona Agent, MD as PCP - Cardiology (Cardiology) Rolan Ezra RAMAN, MD as PCP - Advanced Heart Failure (Cardiology) Hunsucker, Donnice SAUNDERS, MD as Consulting Physician (Pulmonary Disease)  Patient Active Problem List   Diagnosis Date Noted   Cocaine use 01/15/2023   Noncompliance 01/13/2023   Adenovirus pneumonia 11/12/2022   Chronic HFrEF (heart failure with reduced ejection fraction) (HCC) 08/13/2022   DM2 (diabetes mellitus, type 2) (HCC) 08/13/2022   Obstructive sleep  apnea (adult) (pediatric) 08/08/2022   Acute respiratory failure with hypoxia and hypercapnia (HCC) 05/04/2022   Third degree heart block (HCC) 04/15/2022   Snoring 04/15/2022   Nonischemic cardiomyopathy (HCC) 04/15/2022   HFrEF (heart failure with reduced ejection fraction) (HCC) 04/15/2022   COPD with acute exacerbation (HCC) 03/22/2022   Chronic combined systolic and diastolic CHF (congestive heart failure) (HCC)    Tobacco use disorder 03/08/2022   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   HTN (hypertension) 03/08/2022   Stage 3a chronic kidney disease (CKD) (HCC) 03/08/2022   Coronary artery disease 03/08/2022    Current Outpatient Medications:    Acoramidis , 712MG  Twice Daily, (ATTRUBY ) 356 MG TBPK, Take 712 mg by mouth 2 (two) times daily., Disp: 112 each, Rfl: 11   albuterol  (VENTOLIN  HFA) 108 (90 Base) MCG/ACT inhaler, Inhale 2 puffs into the lungs every 4 (four) hours as needed for wheezing or shortness of breath., Disp: 1 each, Rfl: 2   atorvastatin  (LIPITOR ) 80 MG tablet, TAKE 1 Tablet BY MOUTH ONCE DAILY EVERY EVENING, Disp: 90 tablet, Rfl: 3   bisoprolol  (ZEBETA ) 5 MG tablet, Take 1 Tablet by mouth once daily (morning), Disp: 90 tablet, Rfl: 3   dapagliflozin  propanediol (FARXIGA ) 10 MG TABS tablet, Take 1 tablet (10 mg total) by mouth daily., Disp: 30 tablet, Rfl: 11   furosemide  (LASIX ) 20 MG tablet, Take 1 tablet (  20 mg total) by mouth every other day., Disp: 30 tablet, Rfl: 6   ipratropium-albuterol  (DUONEB) 0.5-2.5 (3) MG/3ML SOLN, Take 3 mLs by nebulization as needed., Disp: , Rfl:    isosorbide -hydrALAZINE  (BIDIL ) 20-37.5 MG tablet, TAKE half TABLET BY MOUTH THREE TIMES DAILY (MORNING, noon, evening), Disp: 45 tablet, Rfl: 5   metFORMIN  (GLUCOPHAGE ) 500 MG tablet, Take 1 tablet (500 mg total) by mouth 2 (two) times daily with a meal., Disp: 60 tablet, Rfl: 0   montelukast  (SINGULAIR ) 10 MG tablet, Take 1 tablet (10 mg total) by mouth at bedtime., Disp: 30 tablet,  Rfl: 2   sacubitril -valsartan  (ENTRESTO ) 24-26 MG, Take 1 tablet by mouth 2 (two) times daily., Disp: 60 tablet, Rfl: 11   spironolactone  (ALDACTONE ) 25 MG tablet, Take 1 tablet (25 mg total) by mouth daily., Disp: 90 tablet, Rfl: 3   TRELEGY ELLIPTA 100-62.5-25 MCG/ACT AEPB, Take 1 puff by mouth daily., Disp: , Rfl:  Allergies  Allergen Reactions   Vyndamax  [Tafamidis ] Dermatitis    Allergic dermatitis/rash     Social History   Socioeconomic History   Marital status: Single    Spouse name: Not on file   Number of children: 1   Years of education: Not on file   Highest education level: 10th grade  Occupational History   Occupation: Disability  Tobacco Use   Smoking status: Some Days    Current packs/day: 0.00    Average packs/day: 0.5 packs/day for 50.0 years (25.0 ttl pk-yrs)    Types: Cigarettes    Start date: 03/19/1972    Last attempt to quit: 03/19/2022    Years since quitting: 2.1   Smokeless tobacco: Never   Tobacco comments:    Smoking cessation  Vaping Use   Vaping status: Never Used  Substance and Sexual Activity   Alcohol use: Not on file    Comment: socially   Drug use: Yes    Types: Marijuana    Comment: past   Sexual activity: Not on file  Other Topics Concern   Not on file  Social History Narrative   He lives with his daughter apparently and 5 grandchildren.  He reports that he smokes probably less than a pack of cigarettes a day.  He occasionally drinks a beer or liquor but not daily.   Social Drivers of Corporate investment banker Strain: Low Risk  (03/11/2022)   Overall Financial Resource Strain (CARDIA)    Difficulty of Paying Living Expenses: Not very hard  Food Insecurity: No Food Insecurity (01/13/2023)   Hunger Vital Sign    Worried About Running Out of Food in the Last Year: Never true    Ran Out of Food in the Last Year: Never true  Recent Concern: Food Insecurity - Food Insecurity Present (10/26/2022)   Hunger Vital Sign    Worried About  Programme researcher, broadcasting/film/video in the Last Year: Often true    Ran Out of Food in the Last Year: Often true  Transportation Needs: Unmet Transportation Needs (01/06/2024)   PRAPARE - Administrator, Civil Service (Medical): Yes    Lack of Transportation (Non-Medical): Yes  Physical Activity: Not on file  Stress: Not on file  Social Connections: Not on file  Intimate Partner Violence: Not At Risk (01/13/2023)   Humiliation, Afraid, Rape, and Kick questionnaire    Fear of Current or Ex-Partner: No    Emotionally Abused: No    Physically Abused: No    Sexually Abused: No  Physical Exam      Future Appointments  Date Time Provider Department Center  06/01/2024 10:00 AM [facility] 2 [facility] [place]  06/14/2024 10:00 AM [facility] PA/NP [facility] None  06/22/2024  7:40 AM [facility] PET CT 1 [facility] 

## 2024-06-01 ENCOUNTER — Ambulatory Visit (HOSPITAL_COMMUNITY)

## 2024-06-01 ENCOUNTER — Encounter (HOSPITAL_COMMUNITY)
Admission: RE | Admit: 2024-06-01 | Discharge: 2024-06-01 | Disposition: A | Discharge status: Home / Self Care | Source: Ambulatory Visit | Attending: Cardiology | Admitting: Cardiology

## 2024-06-01 ENCOUNTER — Other Ambulatory Visit: Payer: Self-pay

## 2024-06-08 ENCOUNTER — Telehealth (HOSPITAL_COMMUNITY): Payer: Self-pay | Admitting: Licensed Clinical Social Worker

## 2024-06-08 ENCOUNTER — Other Ambulatory Visit (HOSPITAL_COMMUNITY): Payer: Self-pay

## 2024-06-08 ENCOUNTER — Other Ambulatory Visit (HOSPITAL_COMMUNITY): Payer: Self-pay | Admitting: Emergency Medicine

## 2024-06-08 NOTE — Progress Notes (Signed)
 Paramedicine Encounter    Patient ID: Marcus Tran, male    DOB: 04/23/1951, 73 y.o.   MRN: [id number]   Complaints NONE  Assessment A&O x 4, skin W&D w/ good color.  Denies chest pain or SOB.  No peripheral edema noted.  Lung sounds clear and equal bilat.  Compliance with meds YES- bubble packs reviewed  Pill box filled N/A  Refills needed NONE  Meds changes since last visit NONE    Social changes NONE   BP 90/60 (BP Location: Left Arm, Patient Position: Sitting, Cuff Size: Normal)   Pulse 64   Resp 16   Wt 137 lb (62.1 kg)   SpO2 93%   BMI 22.80 kg/m  Weight yesterday- Last visit weight-136lb  ACTION: Home visit completed  [name] [phone] 06/08/24  Patient Care Team: [name], NP as PCP - General Lavona Agent, MD as PCP - Cardiology (Cardiology) Rolan Ezra RAMAN, MD as PCP - Advanced Heart Failure (Cardiology) Hunsucker, Donnice SAUNDERS, MD as Consulting Physician (Pulmonary Disease)  Patient Active Problem List   Diagnosis Date Noted  . Cocaine use 01/15/2023  . Noncompliance 01/13/2023  . Adenovirus pneumonia 11/12/2022  . Chronic HFrEF (heart failure with reduced ejection fraction) (HCC) 08/13/2022  . DM2 (diabetes mellitus, type 2) (HCC) 08/13/2022  . Obstructive sleep apnea (adult) (pediatric) 08/08/2022  . Acute respiratory failure with hypoxia and hypercapnia (HCC) 05/04/2022  . Third degree heart block (HCC) 04/15/2022  . Snoring 04/15/2022  . Nonischemic cardiomyopathy (HCC) 04/15/2022  . HFrEF (heart failure with reduced ejection fraction) (HCC) 04/15/2022  . COPD with acute exacerbation (HCC) 03/22/2022  . Chronic combined systolic and diastolic CHF (congestive heart failure) (HCC)   . Tobacco use disorder 03/08/2022  . 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 HTN (hypertension) 03/08/2022  . Stage 3a chronic kidney disease (CKD) (HCC) 03/08/2022  . Coronary artery disease 03/08/2022    Current Outpatient  Medications:  .  Acoramidis , 712MG  Twice Daily, (ATTRUBY ) 356 MG TBPK, Take 712 mg by mouth 2 (two) times daily., Disp: 112 each, Rfl: 11 .  albuterol  (VENTOLIN  HFA) 108 (90 Base) MCG/ACT inhaler, Inhale 2 puffs into the lungs every 4 (four) hours as needed for wheezing or shortness of breath., Disp: 1 each, Rfl: 2 .  atorvastatin  (LIPITOR ) 80 MG tablet, TAKE 1 Tablet BY MOUTH ONCE DAILY EVERY EVENING, Disp: 90 tablet, Rfl: 3 .  bisoprolol  (ZEBETA ) 5 MG tablet, Take 1 Tablet by mouth once daily (morning), Disp: 90 tablet, Rfl: 3 .  dapagliflozin  propanediol (FARXIGA ) 10 MG TABS tablet, Take 1 tablet (10 mg total) by mouth daily., Disp: 30 tablet, Rfl: 11 .  furosemide  (LASIX ) 20 MG tablet, Take 1 tablet (20 mg total) by mouth every other day., Disp: 30 tablet, Rfl: 6 .  ipratropium-albuterol  (DUONEB) 0.5-2.5 (3) MG/3ML SOLN, Take 3 mLs by nebulization as needed., Disp: , Rfl:  .  metFORMIN  (GLUCOPHAGE ) 500 MG tablet, Take 1 tablet (500 mg total) by mouth 2 (two) times daily with a meal., Disp: 60 tablet, Rfl: 0 .  montelukast  (SINGULAIR ) 10 MG tablet, Take 1 tablet (10 mg total) by mouth at bedtime., Disp: 30 tablet, Rfl: 2 .  sacubitril -valsartan  (ENTRESTO ) 24-26 MG, Take 1 tablet by mouth 2 (two) times daily., Disp: 60 tablet, Rfl: 11 .  spironolactone  (ALDACTONE ) 25 MG tablet, Take 1 tablet (25 mg total) by mouth daily., Disp: 90 tablet, Rfl: 3 .  TRELEGY ELLIPTA 100-62.5-25 MCG/ACT AEPB, Take 1 puff by mouth daily.,  Disp: , Rfl:  .  isosorbide -hydrALAZINE  (BIDIL ) 20-37.5 MG tablet, TAKE half TABLET BY MOUTH THREE TIMES DAILY (MORNING, noon, evening), Disp: 45 tablet, Rfl: 5 Allergies  Allergen Reactions  . Vyndamax  [Tafamidis ] Dermatitis    Allergic dermatitis/rash     Social History   Socioeconomic History  . Marital status: Single    Spouse name: Not on file  . Number of children: 1  . Years of education: Not on file  . Highest education level: 10th grade  Occupational History  .  Occupation: Disability  Tobacco Use  . Smoking status: Some Days    Current packs/day: 0.00    Average packs/day: 0.5 packs/day for 50.0 years (25.0 ttl pk-yrs)    Types: Cigarettes    Start date: 03/19/1972    Last attempt to quit: 03/19/2022    Years since quitting: 2.2  . Smokeless tobacco: Never  . Tobacco comments:    Smoking cessation  Vaping Use  . Vaping status: Never Used  Substance and Sexual Activity  . Alcohol use: Not on file    Comment: socially  . Drug use: Yes    Types: Marijuana    Comment: past  . Sexual activity: Not on file  Other Topics Concern  . Not on file  Social History Narrative   He lives with his daughter apparently and 5 grandchildren.  He reports that he smokes probably less than a pack of cigarettes a day.  He occasionally drinks a beer or liquor but not daily.   Social Drivers of Health   Financial Resource Strain: Low Risk  (03/11/2022)   Overall Financial Resource Strain (CARDIA)   . Difficulty of Paying Living Expenses: Not very hard  Food Insecurity: No Food Insecurity (01/13/2023)   Hunger Vital Sign   . Worried About Programme researcher, broadcasting/film/video in the Last Year: Never true   . Ran Out of Food in the Last Year: Never true  Recent Concern: Food Insecurity - Food Insecurity Present (10/26/2022)   Hunger Vital Sign   . Worried About Programme researcher, broadcasting/film/video in the Last Year: Often true   . Ran Out of Food in the Last Year: Often true  Transportation Needs: Unmet Transportation Needs (01/06/2024)   PRAPARE - Transportation   . Lack of Transportation (Medical): Yes   . Lack of Transportation (Non-Medical): Yes  Physical Activity: Not on file  Stress: Not on file  Social Connections: Not on file  Intimate Partner Violence: Not At Risk (01/13/2023)   Humiliation, Afraid, Rape, and Kick questionnaire   . Fear of Current or Ex-Partner: No   . Emotionally Abused: No   . Physically Abused: No   . Sexually Abused: No    Physical Exam      Future  Appointments  Date Time Provider Department Center  06/14/2024 10:00 AM [facility] PA/NP [facility] None  06/22/2024  7:40 AM [facility] PET CT 1 [facility] [place]

## 2024-06-09 NOTE — Telephone Encounter (Signed)
 H&V Care Navigation CSW Progress Note  Clinical Social Worker informed by [employer] that pt received 10 day vacate notice for his house.  Per patient he has been paying $750/month in rent each month but got a 10 day to vacate notice stating he was supposed to be paying $1000/month and was $1000 behind on rent.  Apparently management changed a few months ago but patient states he didn't have any knowledge of this and state they did not sign any new agreements.  CSW sent information regarding [facility] information to patient daughter.     SDOH Screenings   Food Insecurity: No Food Insecurity (01/13/2023)  Recent Concern: Food Insecurity - Food Insecurity Present (10/26/2022)  Housing: Medium Risk (09/24/2023)  Transportation Needs: Unmet Transportation Needs (01/06/2024)  Utilities: Not At Risk (01/13/2023)  Alcohol Screen: Low Risk  (05/06/2022)  Financial Resource Strain: Low Risk  (03/11/2022)  Tobacco Use: High Risk (05/04/2024)   Will continue to follow and assist as able  Marcus HILARIO Leech, LCSW Clinical Social Worker [facility] Desk#: [phone] Cell#: [phone]

## 2024-06-12 ENCOUNTER — Other Ambulatory Visit (HOSPITAL_COMMUNITY): Payer: Self-pay

## 2024-06-13 ENCOUNTER — Telehealth (HOSPITAL_COMMUNITY): Payer: Self-pay

## 2024-06-13 NOTE — Telephone Encounter (Signed)
 Called to confirm/remind patient of their appointment at the [facility] on 06/14/24.   Appointment:   [x] Confirmed  [] Left mess   [] No answer/No voice mail  [] VM Full/unable to leave message  [] Phone not in service  Patient reminded to bring all medications and/or complete list.  Confirmed patient has transportation. Gave directions, instructed to utilize valet parking.

## 2024-06-14 ENCOUNTER — Other Ambulatory Visit: Payer: Self-pay

## 2024-06-14 ENCOUNTER — Encounter (HOSPITAL_COMMUNITY): Payer: Self-pay

## 2024-06-14 ENCOUNTER — Ambulatory Visit (HOSPITAL_COMMUNITY)
Admission: RE | Admit: 2024-06-14 | Discharge: 2024-06-14 | Disposition: A | Discharge status: Home / Self Care | Source: Ambulatory Visit | Attending: Family Medicine

## 2024-06-14 ENCOUNTER — Ambulatory Visit (HOSPITAL_COMMUNITY): Payer: Self-pay | Admitting: Family Medicine

## 2024-06-14 VITALS — BP 126/84 | HR 66 | Ht 65.5 in | Wt 136.4 lb

## 2024-06-14 DIAGNOSIS — Z59811 Housing instability, housed, with risk of homelessness: Secondary | ICD-10-CM | POA: Diagnosis not present

## 2024-06-14 DIAGNOSIS — F17210 Nicotine dependence, cigarettes, uncomplicated: Secondary | ICD-10-CM | POA: Insufficient documentation

## 2024-06-14 DIAGNOSIS — G4733 Obstructive sleep apnea (adult) (pediatric): Secondary | ICD-10-CM | POA: Diagnosis not present

## 2024-06-14 DIAGNOSIS — I110 Hypertensive heart disease with heart failure: Secondary | ICD-10-CM | POA: Insufficient documentation

## 2024-06-14 DIAGNOSIS — Z7982 Long term (current) use of aspirin: Secondary | ICD-10-CM | POA: Diagnosis not present

## 2024-06-14 DIAGNOSIS — I428 Other cardiomyopathies: Secondary | ICD-10-CM | POA: Diagnosis not present

## 2024-06-14 DIAGNOSIS — Z7984 Long term (current) use of oral hypoglycemic drugs: Secondary | ICD-10-CM | POA: Diagnosis not present

## 2024-06-14 DIAGNOSIS — I4510 Unspecified right bundle-branch block: Secondary | ICD-10-CM | POA: Insufficient documentation

## 2024-06-14 DIAGNOSIS — Z720 Tobacco use: Secondary | ICD-10-CM

## 2024-06-14 DIAGNOSIS — I10 Essential (primary) hypertension: Secondary | ICD-10-CM

## 2024-06-14 DIAGNOSIS — I5022 Chronic systolic (congestive) heart failure: Secondary | ICD-10-CM

## 2024-06-14 DIAGNOSIS — Z87898 Personal history of other specified conditions: Secondary | ICD-10-CM | POA: Diagnosis not present

## 2024-06-14 DIAGNOSIS — J449 Chronic obstructive pulmonary disease, unspecified: Secondary | ICD-10-CM | POA: Diagnosis present

## 2024-06-14 DIAGNOSIS — I2510 Atherosclerotic heart disease of native coronary artery without angina pectoris: Secondary | ICD-10-CM | POA: Diagnosis not present

## 2024-06-14 DIAGNOSIS — Z79899 Other long term (current) drug therapy: Secondary | ICD-10-CM | POA: Diagnosis not present

## 2024-06-14 LAB — BASIC METABOLIC PANEL WITH GFR
Anion gap: 9 (ref 5–15)
BUN: 37 mg/dL — ABNORMAL HIGH (ref 8–23)
CO2: 21 mmol/L — ABNORMAL LOW (ref 22–32)
Calcium: 9.4 mg/dL (ref 8.9–10.3)
Chloride: 102 mmol/L (ref 98–111)
Creatinine, Ser: 1.95 mg/dL — ABNORMAL HIGH (ref 0.61–1.24)
GFR, Estimated: 35 mL/min — ABNORMAL LOW (ref >60)
Glucose, Bld: 97 mg/dL (ref 70–99)
Potassium: 5.3 mmol/L — ABNORMAL HIGH (ref 3.5–5.1)
Sodium: 132 mmol/L — ABNORMAL LOW (ref 135–145)

## 2024-06-14 LAB — BRAIN NATRIURETIC PEPTIDE: B Natriuretic Peptide: 303.4 pg/mL — ABNORMAL HIGH (ref 0.0–100.0)

## 2024-06-14 MED ORDER — ASPIRIN 81 MG PO TBEC: 81.0000 mg | DELAYED_RELEASE_TABLET | Freq: Every day | ORAL | Status: AC

## 2024-06-14 NOTE — Addendum Note (Signed)
 Encounter addended by: Cathern Andriette DEL, LCSW on: 06/14/2024 11:57 AM  Actions taken: Clinical Note Signed

## 2024-06-14 NOTE — Addendum Note (Signed)
 Encounter addended by: Washington Geofm NOVAK, RN on: 06/14/2024 12:15 PM  Actions taken: Charge Capture section accepted

## 2024-06-14 NOTE — Progress Notes (Signed)
 Specialty Pharmacy Refill Coordination Note  Marcus Tran is a 73 y.o. male contacted today regarding refills of specialty medication(s) Acoramidis  HCl (Attruby )  Spoke with patient's daughter  Patient requested Delivery   Delivery date: 06/15/24   Verified address: [address]  [place] [address]   Medication will be filled on 06/14/24.

## 2024-06-14 NOTE — Addendum Note (Signed)
 Encounter addended by: Cathern Andriette DEL, LCSW on: 06/14/2024 11:40 AM  Actions taken: Clinical Note Signed

## 2024-06-14 NOTE — Progress Notes (Signed)
 ADVANCED HF CLINIC NOTE  PCP: [name], NP [facility] PCP  Primary Cardiologist: Lynwood Schilling, MD  HF Cardiology: Dr. Rolan   HPI: 73 y.o. [name] with hx tobacco use, COPD, OSA on Bipap, HTN, and chronic HFmrEF.   Admitted 5/23 with acute hypoxic respiratory failure secondary to suspected COPD and acute systolic CHF. Initially in respiratory distress and required BiPAP. BP significantly elevated at 189/133, remained elevated throughout much of admission but improved with titration of GDMT. CTA chest negative for PE. Echo EF 35%, RV okay, RVSP 54 mmHg. R/LHC showed nonobstructive CAD; RA mean 12, PA 54/29 (37), PCWP mean 23, LVEDP 22 mmHg, Fick CO/CI 3.84/2.12. He diuresed with IV lasix .    He was readmitted 6/23 with acute on chronic respiratory failure with hypoxia secondary to acute COPD exacerbation. Also thought to possibly have some component of CHF. He diuresed with IV lasix  and received doxycycline  + prednisone . Discharge weight 151 lb.    Zio monitor in 6/23 showed NSR with short runs NSVT and SVT, bradyarrhythmias, idioventricular rhythm, with episodes of third-degree HB noted during sleep hours.  Bradycardia thought to be due to untreated severe OSA.   Admitted 7/23 with acute respiratory failure with hypoxia. O2 sats 80s. He initially required BiPAP. Hypertensive with BP 210/120 and placed on nitro gtt. He was admitted for AECOPD and CHF. Diuresed with IV lasix . Cardiology consulted and felt presentation more likely consistent with AECOPD rather than CHF. Had runs of NSVT up to 20 beats in duration.   Echo 8/23 EF improved 40-45% with LVH. Recommended PYP/ CMRI  Sleep study 9/23 showed severe OSA with AHI of 42.6/hr. He declined in lab BiPap titration.  ED evaluation 10/16/22 for syncope. Low suspicion for PE. CXR ok. COVID negative. K 5.5. Suspected vasovagal episode during coughing fit.    Spironolactone /KCL stopped with recent hyperkalemia.   Admitted 1/24 for AECOPD.  Transiently required BiPap, then able to wean to nasal cannula. Found to have AKI and GDMT initially held then restarted per home regimen. Started on prednisone  taper. He was discharged home, weight 155 lbs.  Admitted 3/24 with COPD exacerbation. Required BiPap and steroids. Started on IV Solu-Medrol . UDS + cocaine. Spiro and Entresto  stopped with AKI.    Had PYP scan 04/09/23 --> Stongly suggestive of TTR. He was started on tafamidis  but developed severe rash/dermatitis after starting that improved when he stopped and came back when he restarted. He is now off tafamidis .   Echo 10/24 showed EF 40% with moderate LVH, global hypokinesis, mild RV enlargement with mildly decreased systolic function, IVC normal.   Seen in UC s/p fall 09/14/23. Underwent R distal thumb reduction.  cMRI 12/24 LVEF 40%, RVEF 46%, elevated ECV consistent with cardiac amyloidosis however LGE pattern not classic, consider cardiac sarcoidosis, extensive mid-wall LGE in the basal to mid septum, subepicardial LGE in the basal to mid inferior wall, and patchy mid-wall LGE in the basal to mid anterior wall.  Today he returns for HF follow up. Overall feeling fine. He is not SOB walking on flat ground. Denies  palpitations, abnormal bleeding, CP, dizziness, edema, or PND/Orthopnea. Appetite ok. Weight at home 135 pounds. Taking all medications. Smoking 5-6 cigs/day. Followed by Paramedicine, [facility]. Does not wear CPAP.  Labs (7/24): urine immunofixation negative, myeloma panel negative Labs (9/24): BNP 553, K 3.5, creatinine 1.75 Labs (10/24): K 4.1, creatinine 1.62 Labs (1/25): K 4.2, creatinine 3.9, LDL 55 Labs (3/25): K 4.4, creatinine 1.85, BNP 71   ECG (personally  reviewed): NSR, RBBB  PMH: 1. OSA: Severe. CPAP. 2. COPD 3. HTN 4. 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  - R/LHC (5/23): nonobstructive CAD; RA mean 12, PA 54/29 (37), PCWP mean 23, LVEDP 22 mmHg, Fick  CO/CI 3.84/2.12. - Echo (5/23): EF 35%, RV okay, RVSP 54 mmHg. - Echo (8/23): EF 40-45% w/ LVH - Echo (10/24): EF 40% with moderate LVH, global hypokinesis, mild RV enlargement with mildly decreased systolic function, IVC normal.  - PYP scan (6/24): Grade 2, H/CL 1.3.  Looks abnormal visually.   - Invitae gene testing negative for TTR mutations.  - cMRI 12/24 LVEF 40%, RVEF 46%, elevated ECV consistent with cardiac amyloidosis, extensive mid-wall LGE in the basal to mid septum, subepicardial LGE in the basal to mid inferior wall, and patchy mid-wall LGE in the basal to mid anterior wall. 5. Hyperlipidemia 6. Bradycardia: Complete HB noted while sleeping, thought to be due to severe OSA.  7. CAD: LHC (5/23) with 50% OM1 stenosis.  8. Syncope: ?vasovagal 9. Type 2 diabetes.   Current Outpatient Medications  Medication Sig Dispense Refill   Acoramidis , 712MG  Twice Daily, (ATTRUBY ) 356 MG TBPK Take 712 mg by mouth 2 (two) times daily. 112 each 11   albuterol  (VENTOLIN  HFA) 108 (90 Base) MCG/ACT inhaler Inhale 2 puffs into the lungs every 4 (four) hours as needed for wheezing or shortness of breath. 1 each 2   atorvastatin  (LIPITOR ) 80 MG tablet TAKE 1 Tablet BY MOUTH ONCE DAILY EVERY EVENING 90 tablet 3   bisoprolol  (ZEBETA ) 5 MG tablet Take 1 Tablet by mouth once daily (morning) 90 tablet 3   dapagliflozin  propanediol (FARXIGA ) 10 MG TABS tablet Take 1 tablet (10 mg total) by mouth daily. 30 tablet 11   furosemide  (LASIX ) 20 MG tablet Take 1 tablet (20 mg total) by mouth every other day. 30 tablet 6   ipratropium-albuterol  (DUONEB) 0.5-2.5 (3) MG/3ML SOLN Take 3 mLs by nebulization as needed.     isosorbide -hydrALAZINE  (BIDIL ) 20-37.5 MG tablet TAKE half TABLET BY MOUTH THREE TIMES DAILY (MORNING, noon, evening) 45 tablet 5   metFORMIN  (GLUCOPHAGE ) 500 MG tablet Take 1 tablet (500 mg total) by mouth 2 (two) times daily with a meal. 60 tablet 0   montelukast  (SINGULAIR ) 10 MG tablet Take 1  tablet (10 mg total) by mouth at bedtime. 30 tablet 2   sacubitril -valsartan  (ENTRESTO ) 24-26 MG Take 1 tablet by mouth 2 (two) times daily. 60 tablet 11   spironolactone  (ALDACTONE ) 25 MG tablet Take 1 tablet (25 mg total) by mouth daily. 90 tablet 3   TRELEGY ELLIPTA 100-62.5-25 MCG/ACT AEPB Take 1 puff by mouth daily.     No current facility-administered medications for this encounter.   Allergies  Allergen Reactions   Vyndamax  [Tafamidis ] Dermatitis    Allergic dermatitis/rash    Social History   Socioeconomic History   Marital status: Single    Spouse name: Not on file   Number of children: 1   Years of education: Not on file   Highest education level: 10th grade  Occupational History   Occupation: Disability  Tobacco Use   Smoking status: Some Days    Current packs/day: 0.00    Average packs/day: 0.5 packs/day for 50.0 years (25.0 ttl pk-yrs)    Types: Cigarettes    Start date: 03/19/1972    Last attempt to quit: 03/19/2022    Years since quitting: 2.2   Smokeless tobacco: Never   Tobacco comments:  Smoking cessation  Vaping Use   Vaping status: Never Used  Substance and Sexual Activity   Alcohol use: Not on file    Comment: socially   Drug use: Yes    Types: Marijuana    Comment: past   Sexual activity: Not on file  Other Topics Concern   Not on file  Social History Narrative   He lives with his daughter apparently and 5 grandchildren.  He reports that he smokes probably less than a pack of cigarettes a day.  He occasionally drinks a beer or liquor but not daily.   Social Drivers of Corporate investment banker Strain: Low Risk  (03/11/2022)   Overall Financial Resource Strain (CARDIA)    Difficulty of Paying Living Expenses: Not very hard  Food Insecurity: No Food Insecurity (01/13/2023)   Hunger Vital Sign    Worried About Running Out of Food in the Last Year: Never true    Ran Out of Food in the Last Year: Never true  Recent Concern: Food Insecurity -  Food Insecurity Present (10/26/2022)   Hunger Vital Sign    Worried About Programme researcher, broadcasting/film/video in the Last Year: Often true    Ran Out of Food in the Last Year: Often true  Transportation Needs: Unmet Transportation Needs (01/06/2024)   PRAPARE - Administrator, Civil Service (Medical): Yes    Lack of Transportation (Non-Medical): Yes  Physical Activity: Not on file  Stress: Not on file  Social Connections: Not on file  Intimate Partner Violence: Not At Risk (01/13/2023)   Humiliation, Afraid, Rape, and Kick questionnaire    Fear of Current or Ex-Partner: No    Emotionally Abused: No    Physically Abused: No    Sexually Abused: No   Family History  Problem Relation Age of Onset   Hypertension Mother    BP 126/84   Pulse 66   Ht 5' 5.5 (1.664 m)   Wt 61.9 kg (136 lb 6.4 oz)   SpO2 92%   BMI 22.35 kg/m   Wt Readings from Last 3 Encounters:  06/14/24 61.9 kg (136 lb 6.4 oz)  06/08/24 62.1 kg (137 lb)  05/26/24 62 kg (136 lb 9.6 oz)   PHYSICAL EXAM: General:  NAD. No resp difficulty, walked into clinic HEENT: Normal Neck: Supple. No JVD. Cor: Regular rate & rhythm. No rubs, gallops or murmurs. Lungs: Clear, diminished in bases Abdomen: Soft, nontender, nondistended.  Extremities: No cyanosis, clubbing, rash, edema Neuro: Alert & oriented x 3, moves all 4 extremities w/o difficulty. Affect pleasant.  ASSESSMENT & PLAN:  1.  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 Echo (5/23) with LVEF 35%, RV okay.  [facility] 5/23 showed nonobstructive CAD; RA mean 12, PA 54/29 (37), PCWP mean 23, LVEDP 22 mmHg. Echo in 8/23 with EF 40-45%.  Echo in 10/24 showed EF 40% with moderate LVH, global hypokinesis, mild RV enlargement with mildly decreased systolic function, IVC normal. PYP scan concerning for TTR cardiac amyloidosis. The PYP scan was not read as floridly positive, but visually appeared positive. Myeloma studies were negative.   Invitae gene testing negative for TTR gene mutations.  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   He did not tolerate tafamidis  due to severe rash and was transitioned to acoramidis .  Cardiac MRI was done in 12/24, showing LVEF 40%, RVEF 46%, elevated ECV 40% consistent with cardiac amyloidosis, extensive mid-wall LGE in the basal to mid septum,  subepicardial LGE in the basal to mid inferior wall, and patchy mid-wall LGE in the basal to mid anterior wall. Elevated ECV percentage on cardiac MRI was consistent with cardiac amyloidosis though LGE pattern was not classic for amyloidosis and could also be seen with cardiac sarcoidosis or an arrhythmogenic cardiomyopathy.  NYHA class II, he is not volume overloaded today. GDMT limited by CKD3. - Arrange for cardiac PET to assess for cardiac sarcoidosis, will also get HRCT chest to look for evidence of pulmonary sarcoidosis.  When I see him back in the office, would be reasonable to get Prevention genetic testing for common cardiomyopathies.  If questions remain about his diagnosis after these studies, endomyocardial biopsy would be appropriate.  - He is now on acoramidis . - Continue spironolactone  25 mg daily. BMET and BNP today. - Continue Entresto  24/26 mg bid.  - Continue Lasix  20 mg every other day.  - Continue Farxiga  10 mg daily.  - Continue bisoprolol  5 mg daily, low threshold to cut back with history of bradycardia. - Continue BiDil  0.5 tab tid. 2. Syncope:  Multiple episodes in the past. Evaluated in the ED 12/23. Event felt to by vasovagal.  2 week Zio (6/23) showed mostly NSR, significant bradycardia during sleeping hours w/ intermittent CHB thought to be due to severe OSA. No recent recurrence.  - Low threshold to cut back on bisoprolol .  - Continue to use CPAP.  3. HTN: BP stable - Continue current meds. 4. OSA: Severe by sleep study.   - Continue to use CPAP.  5. CAD: Nonobstructive on [facility] 5/23.  No chest pain.  - Continue  aspirin  and statin, check lipids today.  6. COPD: Still smoking. Has history of COPD exacerbations though minimally symptomatic at this time.  - Discussed smoking cessation.  - On Trelegy. Followed by Pulmonary   Follow up in 3 months with Dr. Rolan Harlene Gainer, FNP-BC 06/14/24

## 2024-06-14 NOTE — Patient Instructions (Addendum)
 Take aspirin  81 mg daily. Labs today - will call you if abnormal. We have re-scheduled your CT chest - see below. Return to see Dr. Rolan in 3 months. CALL [phone] IN OCTOBER TO SCHEDULE THIS APPOINTMENT.  Please call us  at [phone] if any questions or concerns prior to your next visit.   Genetic testing has been collected, this has to be sent to [place]  for processing and can take 1-2 weeks for us  to get results back.  We will let you know the results once reviewed by your provider.

## 2024-06-14 NOTE — Progress Notes (Addendum)
 H&V Care Navigation CSW Progress Note  Clinical Social Worker spoke with pt regarding pending eviction.  Provided information regarding [employer] eviction mediation program and encouraged him to call ASAP.  Also encouraged him to start thinking of other possible housing options- doesn't think he has anyone he could stay with.  CSW messaging boarding room landlords to inquire about availability.  Provided their phone numbers to patient- there were several rooms available between $670-950/month including utilities.  CSW reached out to lawyer to get details as patient maintains he had no idea about management change or rent change. Lawyer states that management changed on July 21st and that at that point a 30 day vacate notice was given.  Lease from previous owner expired on August 21st and new owners do not want to renew a lease so regardless of if payment was received they would plan to evict the following month.    Patient informed of above.   SDOH Screenings   Food Insecurity: No Food Insecurity (01/13/2023)  Recent Concern: Food Insecurity - Food Insecurity Present (10/26/2022)  Housing: Medium Risk (09/24/2023)  Transportation Needs: Unmet Transportation Needs (01/06/2024)  Utilities: Not At Risk (01/13/2023)  Alcohol Screen: Low Risk  (05/06/2022)  Financial Resource Strain: Low Risk  (03/11/2022)  Tobacco Use: High Risk (06/14/2024)    Andriette HILARIO Leech, LCSW Clinical Social Worker Advanced [facility] Clinic Desk#: [phone] Cell#: [phone]

## 2024-06-15 ENCOUNTER — Other Ambulatory Visit (HOSPITAL_COMMUNITY): Payer: Self-pay | Admitting: Emergency Medicine

## 2024-06-15 MED ORDER — FUROSEMIDE 20 MG PO TABS: 20.0000 mg | ORAL_TABLET | Freq: Every day | ORAL | 5 refills | Status: DC

## 2024-06-15 MED ORDER — FUROSEMIDE 20 MG PO TABS: 20.0000 mg | ORAL_TABLET | Freq: Every day | ORAL | Status: DC

## 2024-06-15 NOTE — Progress Notes (Signed)
 Stopped by to follow up on med changes from yesterday's clinic visit.  Called My Pharmacy to advise of change as pt's pill packs are due to be refilled in a few days. Pt verbalizes an understanding to take 1 20mg . Furosemide  tablet daily.      Mary Sharps, EMT-Paramedic [phone] 06/15/2024

## 2024-06-20 ENCOUNTER — Telehealth (HOSPITAL_COMMUNITY): Payer: Self-pay | Admitting: Emergency Medicine

## 2024-06-20 NOTE — Telephone Encounter (Signed)
 Reaching out to patient to offer assistance regarding upcoming cardiac imaging study; pt verbalizes understanding of appt date/time, parking situation and where to check in, pre-test NPO status and medications ordered, and verified current allergies; name and call back number provided for further questions should they arise Rockwell Alexandria RN Navigator Cardiac Imaging [facility] [phone] office [phone] cell

## 2024-06-22 ENCOUNTER — Ambulatory Visit (HOSPITAL_COMMUNITY): Admission: RE | Admit: 2024-06-22 | Source: Ambulatory Visit

## 2024-06-22 ENCOUNTER — Other Ambulatory Visit (HOSPITAL_COMMUNITY)

## 2024-06-22 ENCOUNTER — Telehealth (HOSPITAL_COMMUNITY): Payer: Self-pay | Admitting: Emergency Medicine

## 2024-06-22 NOTE — Telephone Encounter (Signed)
 Marcus Tran called me this morning to advise that he missed his PET Scan this morning.  His daughter was supposed to take him but could not.  He also has a LAB appointment  @ H&V @ 11:30.  Advised him to keep this appointment and he says he will do so.    Mary Sharps, EMT-Paramedic [phone] 06/22/2024

## 2024-06-23 ENCOUNTER — Ambulatory Visit (HOSPITAL_COMMUNITY): Attending: Cardiology

## 2024-06-28 ENCOUNTER — Other Ambulatory Visit (HOSPITAL_COMMUNITY): Payer: Self-pay | Admitting: Emergency Medicine

## 2024-06-28 NOTE — Progress Notes (Unsigned)
 Paramedicine Encounter    Patient ID: Marcus Tran, male    DOB: 1951/10/06, 73 y.o.   MRN: [id number]   Complaints NONE  Assessment A&O x 4, skin W&D w/ good color.  Denies chest pain or SOB.  Lung sounds clear and equal in upper lobes w/ some mild ronchi in the bases.  Compliance with meds yes  Pill box filled n/a  Bubble packs  Refills needed Baby ASA  Meds changes since last visit  Furosemide  20mg . 1 tab. daily   Social changes In the process of being evicted from his home due to change in the owner of the residence.   BP 90/62 (BP Location: Right Arm, Patient Position: Sitting, Cuff Size: Normal)   Pulse 64   Resp 14   Wt 138 lb 9.6 oz (62.9 kg)   SpO2 93%   BMI 22.71 kg/m  Weight yesterday- not taken Last visit weight-137lb  [name] sitting outside w/ his grandchildren.  He advises that he is feeling good but is under a lot of stress as he is searching for a new place to live.  There is a new owner of his home and he is being evicted so that the owner can pursue new uses for the residence. Marcus Tran missed his recent Cardiac PET scan due to his daughter not being available to transport him that day as she had said she would.  Will assist getting this rescheduled for him.  ACTION: Home visit completed  Marcus Tran [phone] 06/29/24  Patient Care Team: [name], NP as PCP - General Lavona Agent, MD as PCP - Cardiology (Cardiology) Rolan Ezra RAMAN, MD as PCP - Advanced Heart Failure (Cardiology) Hunsucker, Donnice SAUNDERS, MD as Consulting Physician (Pulmonary Disease)  Patient Active Problem List   Diagnosis Date Noted   Cocaine use 01/15/2023   Noncompliance 01/13/2023   Adenovirus pneumonia 11/12/2022   Chronic HFrEF (heart failure with reduced ejection fraction) (HCC) 08/13/2022   DM2 (diabetes mellitus, type 2) (HCC) 08/13/2022   Obstructive sleep apnea (adult) (pediatric) 08/08/2022   Acute respiratory failure with hypoxia and  hypercapnia (HCC) 05/04/2022   Third degree heart block (HCC) 04/15/2022   Snoring 04/15/2022   Nonischemic cardiomyopathy (HCC) 04/15/2022   HFrEF (heart failure with reduced ejection fraction) (HCC) 04/15/2022   COPD with acute exacerbation (HCC) 03/22/2022   Chronic combined systolic and diastolic CHF (congestive heart failure) (HCC)    Tobacco use disorder 03/08/2022   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   HTN (hypertension) 03/08/2022   Stage 3a chronic kidney disease (CKD) (HCC) 03/08/2022   Coronary artery disease 03/08/2022    Current Outpatient Medications:    Acoramidis , 712MG  Twice Daily, (ATTRUBY ) 356 MG TBPK, Take 712 mg by mouth 2 (two) times daily., Disp: 112 each, Rfl: 11   albuterol  (VENTOLIN  HFA) 108 (90 Base) MCG/ACT inhaler, Inhale 2 puffs into the lungs every 4 (four) hours as needed for wheezing or shortness of breath., Disp: 1 each, Rfl: 2   atorvastatin  (LIPITOR ) 80 MG tablet, TAKE 1 Tablet BY MOUTH ONCE DAILY EVERY EVENING, Disp: 90 tablet, Rfl: 3   bisoprolol  (ZEBETA ) 5 MG tablet, Take 1 Tablet by mouth once daily (morning), Disp: 90 tablet, Rfl: 3   dapagliflozin  propanediol (FARXIGA ) 10 MG TABS tablet, Take 1 tablet (10 mg total) by mouth daily., Disp: 30 tablet, Rfl: 11   furosemide  (LASIX ) 20 MG tablet, Take 1 tablet (20 mg total) by mouth daily., Disp: 30 tablet, Rfl: 5  ipratropium-albuterol  (DUONEB) 0.5-2.5 (3) MG/3ML SOLN, Take 3 mLs by nebulization as needed., Disp: , Rfl:    isosorbide -hydrALAZINE  (BIDIL ) 20-37.5 MG tablet, TAKE half TABLET BY MOUTH THREE TIMES DAILY (MORNING, noon, evening), Disp: 45 tablet, Rfl: 5   metFORMIN  (GLUCOPHAGE ) 500 MG tablet, Take 1 tablet (500 mg total) by mouth 2 (two) times daily with a meal., Disp: 60 tablet, Rfl: 0   montelukast  (SINGULAIR ) 10 MG tablet, Take 1 tablet (10 mg total) by mouth at bedtime., Disp: 30 tablet, Rfl: 2   sacubitril -valsartan  (ENTRESTO ) 24-26 MG, Take 1 tablet by mouth 2 (two) times  daily., Disp: 60 tablet, Rfl: 11   spironolactone  (ALDACTONE ) 25 MG tablet, Take 1 tablet (25 mg total) by mouth daily., Disp: 90 tablet, Rfl: 3   TRELEGY ELLIPTA 100-62.5-25 MCG/ACT AEPB, Take 1 puff by mouth daily., Disp: , Rfl:    aspirin  EC 81 MG tablet, Take 1 tablet (81 mg total) by mouth daily. Swallow whole. (Patient not taking: Reported on 06/28/2024), Disp: , Rfl:  Allergies  Allergen Reactions   Vyndamax  [Tafamidis ] Dermatitis    Allergic dermatitis/rash     Social History   Socioeconomic History   Marital status: Single    Spouse name: Not on file   Number of children: 1   Years of education: Not on file   Highest education level: 10th grade  Occupational History   Occupation: Disability  Tobacco Use   Smoking status: Some Days    Current packs/day: 0.00    Average packs/day: 0.5 packs/day for 50.0 years (25.0 ttl pk-yrs)    Types: Cigarettes    Start date: 03/19/1972    Last attempt to quit: 03/19/2022    Years since quitting: 2.2   Smokeless tobacco: Never   Tobacco comments:    Smoking cessation  Vaping Use   Vaping status: Never Used  Substance and Sexual Activity   Alcohol use: Not on file    Comment: socially   Drug use: Yes    Types: Marijuana    Comment: past   Sexual activity: Not on file  Other Topics Concern   Not on file  Social History Narrative   He lives with his daughter apparently and 5 grandchildren.  He reports that he smokes probably less than a pack of cigarettes a day.  He occasionally drinks a beer or liquor but not daily.   Social Drivers of Corporate investment banker Strain: Low Risk  (03/11/2022)   Overall Financial Resource Strain (CARDIA)    Difficulty of Paying Living Expenses: Not very hard  Food Insecurity: No Food Insecurity (01/13/2023)   Hunger Vital Sign    Worried About Running Out of Food in the Last Year: Never true    Ran Out of Food in the Last Year: Never true  Recent Concern: Food Insecurity - Food Insecurity  Present (10/26/2022)   Hunger Vital Sign    Worried About Programme researcher, broadcasting/film/video in the Last Year: Often true    Ran Out of Food in the Last Year: Often true  Transportation Needs: Unmet Transportation Needs (01/06/2024)   PRAPARE - Administrator, Civil Service (Medical): Yes    Lack of Transportation (Non-Medical): Yes  Physical Activity: Not on file  Stress: Not on file  Social Connections: Not on file  Intimate Partner Violence: Not At Risk (01/13/2023)   Humiliation, Afraid, Rape, and Kick questionnaire    Fear of Current or Ex-Partner: No    Emotionally Abused: No  Physically Abused: No    Sexually Abused: No    Physical Exam      Future Appointments  Date Time Provider Department Center  07/06/2024  7:20 AM [facility] PET CT 1 [facility] [place]

## 2024-07-04 ENCOUNTER — Telehealth (HOSPITAL_COMMUNITY): Payer: Self-pay | Admitting: *Deleted

## 2024-07-04 NOTE — Telephone Encounter (Signed)
 Attempted to call patient regarding upcoming cardiac PET appointment. Left message on voicemail with name and callback number Johney Frame RN Navigator Cardiac Imaging [facility] [phone] Office

## 2024-07-05 ENCOUNTER — Other Ambulatory Visit: Payer: Self-pay

## 2024-07-05 NOTE — Progress Notes (Signed)
 Specialty Pharmacy Refill Coordination Note  Marcus Tran is a 73 y.o. male contacted today regarding refills of specialty medication(s) Acoramidis  HCl (Attruby )   Patient requested Delivery   Delivery date: 07/10/24   Verified address: [address]  [place] [address]   Medication will be filled on 07/07/24.

## 2024-07-06 ENCOUNTER — Ambulatory Visit (HOSPITAL_COMMUNITY)
Admission: RE | Admit: 2024-07-06 | Discharge: 2024-07-06 | Disposition: A | Discharge status: Home / Self Care | Source: Ambulatory Visit | Attending: Cardiology | Admitting: Cardiology

## 2024-07-06 ENCOUNTER — Other Ambulatory Visit: Payer: Self-pay

## 2024-07-06 ENCOUNTER — Ambulatory Visit (HOSPITAL_COMMUNITY): Payer: Self-pay | Admitting: Cardiology

## 2024-07-06 DIAGNOSIS — I5042 Chronic combined systolic (congestive) and diastolic (congestive) heart failure: Secondary | ICD-10-CM | POA: Diagnosis not present

## 2024-07-06 LAB — NM PET CT MYOCARDIAL SARCOIDOSIS
LV dias vol: 86 mL (ref 62–150)
Nuc Stress EF: 44 %
Rest Nuclear Isotope Dose: 16.3 mCi

## 2024-07-06 MED ORDER — RUBIDIUM RB82 GENERATOR (RUBYFILL)
16.3100 | PACK | Freq: Once | INTRAVENOUS | Status: AC
  Administered 2024-07-06: 16.31 via INTRAVENOUS

## 2024-07-06 MED ORDER — FLUDEOXYGLUCOSE F - 18 (FDG) INJECTION
9.0300 | Freq: Once | INTRAVENOUS | Status: AC
Start: 2024-07-06 — End: 2024-07-06
  Administered 2024-07-06: 9.03 via INTRAVENOUS

## 2024-07-17 ENCOUNTER — Other Ambulatory Visit: Payer: Self-pay | Admitting: Pulmonary Disease

## 2024-07-17 ENCOUNTER — Other Ambulatory Visit (HOSPITAL_COMMUNITY): Payer: Self-pay | Admitting: Cardiology

## 2024-07-17 DIAGNOSIS — I5020 Unspecified systolic (congestive) heart failure: Secondary | ICD-10-CM

## 2024-07-18 ENCOUNTER — Other Ambulatory Visit (HOSPITAL_COMMUNITY): Payer: Self-pay | Admitting: Emergency Medicine

## 2024-07-18 NOTE — Progress Notes (Unsigned)
 Paramedicine Encounter    Patient ID: Marcus Tran, male    DOB: 08/23/1951, 73 y.o.   MRN: [id number]   Complaints***  Assessment***  Compliance with meds***  Pill box filled***  Refills needed***  Meds changes since last visit***    Social changes***   BP 90/70 (BP Location: Left Arm, Patient Position: Sitting, Cuff Size: Normal)   Pulse 76   Resp 16   Wt 133 lb 12.8 oz (60.7 kg)   BMI 21.93 kg/m  Weight yesterday-*** Last visit weight-***  ACTION: {Paramed Action:[phone]}  Mary Sharps, EMT-Paramedic [phone] 07/18/24  Patient Care Team: [name], NP as PCP - General Lavona Agent, MD as PCP - Cardiology (Cardiology) Rolan Ezra RAMAN, MD as PCP - Advanced Heart Failure (Cardiology) Hunsucker, Donnice SAUNDERS, MD as Consulting Physician (Pulmonary Disease)  Patient Active Problem List   Diagnosis Date Noted  . Cocaine use 01/15/2023  . Noncompliance 01/13/2023  . Adenovirus pneumonia 11/12/2022  . Chronic HFrEF (heart failure with reduced ejection fraction) (HCC) 08/13/2022  . DM2 (diabetes mellitus, type 2) (HCC) 08/13/2022  . Obstructive sleep apnea (adult) (pediatric) 08/08/2022  . Acute respiratory failure with hypoxia and hypercapnia (HCC) 05/04/2022  . Third degree heart block (HCC) 04/15/2022  . Snoring 04/15/2022  . Nonischemic cardiomyopathy (HCC) 04/15/2022  . HFrEF (heart failure with reduced ejection fraction) (HCC) 04/15/2022  . COPD with acute exacerbation (HCC) 03/22/2022  . Chronic combined systolic and diastolic CHF (congestive heart failure) (HCC)   . Tobacco use disorder 03/08/2022  . 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 HTN (hypertension) 03/08/2022  . Stage 3a chronic kidney disease (CKD) (HCC) 03/08/2022  . Coronary artery disease 03/08/2022    Current Outpatient Medications:  .  Acoramidis , 712MG  Twice Daily, (ATTRUBY ) 356 MG TBPK, Take 712 mg by mouth 2 (two) times daily., Disp: 112 each, Rfl: 11 .   albuterol  (VENTOLIN  HFA) 108 (90 Base) MCG/ACT inhaler, Inhale 2 puffs into the lungs every 4 (four) hours as needed for wheezing or shortness of breath., Disp: 1 each, Rfl: 2 .  aspirin  EC 81 MG tablet, Take 1 tablet (81 mg total) by mouth daily. Swallow whole., Disp: , Rfl:  .  atorvastatin  (LIPITOR ) 80 MG tablet, TAKE 1 Tablet BY MOUTH ONCE DAILY EVERY EVENING, Disp: 90 tablet, Rfl: 3 .  bisoprolol  (ZEBETA ) 5 MG tablet, Take 1 Tablet by mouth once daily (morning), Disp: 90 tablet, Rfl: 3 .  dapagliflozin  propanediol (FARXIGA ) 10 MG TABS tablet, Take 1 tablet (10 mg total) by mouth daily., Disp: 30 tablet, Rfl: 11 .  furosemide  (LASIX ) 20 MG tablet, Take 1 tablet (20 mg total) by mouth daily., Disp: 30 tablet, Rfl: 5 .  ipratropium-albuterol  (DUONEB) 0.5-2.5 (3) MG/3ML SOLN, Take 3 mLs by nebulization as needed., Disp: , Rfl:  .  isosorbide -hydrALAZINE  (BIDIL ) 20-37.5 MG tablet, TAKE half TABLET BY MOUTH THREE TIMES DAILY (MORNING, noon, evening), Disp: 45 tablet, Rfl: 5 .  metFORMIN  (GLUCOPHAGE ) 500 MG tablet, Take 1 tablet (500 mg total) by mouth 2 (two) times daily with a meal., Disp: 60 tablet, Rfl: 0 .  montelukast  (SINGULAIR ) 10 MG tablet, Take 1 tablet (10 mg total) by mouth at bedtime., Disp: 30 tablet, Rfl: 2 .  sacubitril -valsartan  (ENTRESTO ) 24-26 MG, Take 1 tablet by mouth 2 (two) times daily., Disp: 60 tablet, Rfl: 11 .  spironolactone  (ALDACTONE ) 25 MG tablet, Take 1 tablet (25 mg total) by mouth daily., Disp: 90 tablet, Rfl: 3 .  TRELEGY ELLIPTA 100-62.5-25 MCG/ACT AEPB, Take  1 puff by mouth daily., Disp: , Rfl:  Allergies  Allergen Reactions  . Vyndamax  [Tafamidis ] Dermatitis    Allergic dermatitis/rash     Social History   Socioeconomic History  . Marital status: Single    Spouse name: Not on file  . Number of children: 1  . Years of education: Not on file  . Highest education level: 10th grade  Occupational History  . Occupation: Disability  Tobacco Use  . Smoking  status: Some Days    Current packs/day: 0.00    Average packs/day: 0.5 packs/day for 50.0 years (25.0 ttl pk-yrs)    Types: Cigarettes    Start date: 03/19/1972    Last attempt to quit: 03/19/2022    Years since quitting: 2.3  . Smokeless tobacco: Never  . Tobacco comments:    Smoking cessation  Vaping Use  . Vaping status: Never Used  Substance and Sexual Activity  . Alcohol use: Not on file    Comment: socially  . Drug use: Yes    Types: Marijuana    Comment: past  . Sexual activity: Not on file  Other Topics Concern  . Not on file  Social History Narrative   He lives with his daughter apparently and 5 grandchildren.  He reports that he smokes probably less than a pack of cigarettes a day.  He occasionally drinks a beer or liquor but not daily.   Social Drivers of Health   Financial Resource Strain: Low Risk  (03/11/2022)   Overall Financial Resource Strain (CARDIA)   . Difficulty of Paying Living Expenses: Not very hard  Food Insecurity: No Food Insecurity (01/13/2023)   Hunger Vital Sign   . Worried About Programme researcher, broadcasting/film/video in the Last Year: Never true   . Ran Out of Food in the Last Year: Never true  Recent Concern: Food Insecurity - Food Insecurity Present (10/26/2022)   Hunger Vital Sign   . Worried About Programme researcher, broadcasting/film/video in the Last Year: Often true   . Ran Out of Food in the Last Year: Often true  Transportation Needs: Unmet Transportation Needs (01/06/2024)   PRAPARE - Transportation   . Lack of Transportation (Medical): Yes   . Lack of Transportation (Non-Medical): Yes  Physical Activity: Not on file  Stress: Not on file  Social Connections: Not on file  Intimate Partner Violence: Not At Risk (01/13/2023)   Humiliation, Afraid, Rape, and Kick questionnaire   . Fear of Current or Ex-Partner: No   . Emotionally Abused: No   . Physically Abused: No   . Sexually Abused: No    Physical Exam      No future appointments.

## 2024-07-25 ENCOUNTER — Other Ambulatory Visit: Payer: Self-pay

## 2024-07-25 NOTE — Progress Notes (Signed)
 Specialty Pharmacy Ongoing Clinical Assessment Note  OTNIEL HOE is a 73 y.o. male who is being followed by the specialty pharmacy service for RxSp Cardiology   Patient's specialty medication(s) reviewed today: Acoramidis  HCl (Attruby )   Missed doses in the last 4 weeks: 0   Patient/Caregiver did not have any additional questions or concerns.   Therapeutic benefit summary: Patient is achieving benefit   Adverse events/side effects summary: No adverse events/side effects   Patient's therapy is appropriate to: Continue    Goals Addressed             This Visit's Progress    Maintain optimal adherence to therapy       Patient is on track. Patient will maintain adherence          Follow up: 12 months  Benoit Meech M Harlynn Kimbell Specialty Pharmacist

## 2024-07-25 NOTE — Progress Notes (Signed)
 Specialty Pharmacy Refill Coordination Note  Marcus Tran is a 73 y.o. male contacted today regarding refills of specialty medication(s) Acoramidis  HCl (Attruby )   Patient requested Delivery   Delivery date: 08/03/24   Verified address: [address]  [name] [id number]   Medication will be filled on 08/02/24.

## 2024-08-02 ENCOUNTER — Other Ambulatory Visit: Payer: Self-pay

## 2024-08-04 ENCOUNTER — Other Ambulatory Visit (HOSPITAL_COMMUNITY): Payer: Self-pay | Admitting: Emergency Medicine

## 2024-08-04 NOTE — Progress Notes (Unsigned)
 Paramedicine Encounter    Patient ID: Marcus Tran, male    DOB: [birth year], 73 y.o.   MRN: [id number]   Complaints NONE  Assessment A&O x 4, skin W&D w/ good color.  Denies chest pain or SOB.  Lung sounds clear  Compliance with meds YES - bubble packs reviewed  Pill box filled n/a  Refills needed NONE  Meds changes since last visit NONE    Social changes NONE   BP 100/70 (BP Location: Right Arm, Patient Position: Sitting, Cuff Size: Normal)  Weight yesterday- Last visit weight-133lb  ACTION: Home visit completed  [name] [phone] 08/04/24  Patient Care Team: [name], NP as PCP - General Lavona Agent, MD as PCP - Cardiology (Cardiology) Rolan Ezra RAMAN, MD as PCP - Advanced Heart Failure (Cardiology) Hunsucker, Donnice SAUNDERS, MD as Consulting Physician (Pulmonary Disease)  Patient Active Problem List   Diagnosis Date Noted   Cocaine use 01/15/2023   Noncompliance 01/13/2023   Adenovirus pneumonia 11/12/2022   Chronic HFrEF (heart failure with reduced ejection fraction) (HCC) 08/13/2022   DM2 (diabetes mellitus, type 2) (HCC) 08/13/2022   Obstructive sleep apnea (adult) (pediatric) 08/08/2022   Acute respiratory failure with hypoxia and hypercapnia (HCC) 05/04/2022   Third degree heart block (HCC) 04/15/2022   Snoring 04/15/2022   Nonischemic cardiomyopathy (HCC) 04/15/2022   HFrEF (heart failure with reduced ejection fraction) (HCC) 04/15/2022   COPD with acute exacerbation (HCC) 03/22/2022   Chronic combined systolic and diastolic CHF (congestive heart failure) (HCC)    Tobacco use disorder 03/08/2022   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   HTN (hypertension) 03/08/2022   Stage 3a chronic kidney disease (CKD) (HCC) 03/08/2022   Coronary artery disease 03/08/2022    Current Outpatient Medications:    Acoramidis , 712MG  Twice Daily, (ATTRUBY ) 356 MG TBPK, Take 712 mg by mouth 2 (two) times daily., Disp: 112 each, Rfl:  11   albuterol  (VENTOLIN  HFA) 108 (90 Base) MCG/ACT inhaler, Inhale 2 puffs into the lungs every 4 (four) hours as needed for wheezing or shortness of breath., Disp: 1 each, Rfl: 2   aspirin  EC 81 MG tablet, Take 1 tablet (81 mg total) by mouth daily. Swallow whole., Disp: , Rfl:    atorvastatin  (LIPITOR ) 80 MG tablet, TAKE 1 Tablet BY MOUTH ONCE DAILY EVERY EVENING, Disp: 90 tablet, Rfl: 3   bisoprolol  (ZEBETA ) 5 MG tablet, Take 1 Tablet by mouth once daily (morning), Disp: 90 tablet, Rfl: 3   dapagliflozin  propanediol (FARXIGA ) 10 MG TABS tablet, Take 1 tablet (10 mg total) by mouth daily., Disp: 30 tablet, Rfl: 11   furosemide  (LASIX ) 20 MG tablet, Take 1 tablet (20 mg total) by mouth daily., Disp: 30 tablet, Rfl: 5   ipratropium-albuterol  (DUONEB) 0.5-2.5 (3) MG/3ML SOLN, Take 3 mLs by nebulization as needed., Disp: , Rfl:    isosorbide -hydrALAZINE  (BIDIL ) 20-37.5 MG tablet, TAKE half TABLET BY MOUTH THREE TIMES DAILY (MORNING, noon, evening), Disp: 45 tablet, Rfl: 5   metFORMIN  (GLUCOPHAGE ) 500 MG tablet, Take 1 tablet (500 mg total) by mouth 2 (two) times daily with a meal., Disp: 60 tablet, Rfl: 0   montelukast  (SINGULAIR ) 10 MG tablet, Take 1 tablet (10 mg total) by mouth at bedtime., Disp: 30 tablet, Rfl: 2   sacubitril -valsartan  (ENTRESTO ) 24-26 MG, Take 1 tablet by mouth 2 (two) times daily., Disp: 60 tablet, Rfl: 11   spironolactone  (ALDACTONE ) 25 MG tablet, Take 1 tablet (25 mg total) by mouth daily., Disp: 90 tablet, Rfl: 3  TRELEGY ELLIPTA 100-62.5-25 MCG/ACT AEPB, Take 1 puff by mouth daily., Disp: , Rfl:  Allergies  Allergen Reactions   Vyndamax  [Tafamidis ] Dermatitis    Allergic dermatitis/rash     Social History   Socioeconomic History   Marital status: Single    Spouse name: Not on file   Number of children: 1   Years of education: Not on file   Highest education level: 10th grade  Occupational History   Occupation: Disability  Tobacco Use   Smoking status: Some  Days    Current packs/day: 0.00    Average packs/day: 0.5 packs/day for 50.0 years (25.0 ttl pk-yrs)    Types: Cigarettes    Start date: 03/19/1972    Last attempt to quit: 03/19/2022    Years since quitting: 2.3   Smokeless tobacco: Never   Tobacco comments:    Smoking cessation  Vaping Use   Vaping status: Never Used  Substance and Sexual Activity   Alcohol use: Not on file    Comment: socially   Drug use: Yes    Types: Marijuana    Comment: past   Sexual activity: Not on file  Other Topics Concern   Not on file  Social History Narrative   He lives with his daughter apparently and 5 grandchildren.  He reports that he smokes probably less than a pack of cigarettes a day.  He occasionally drinks a beer or liquor but not daily.   Social Drivers of Corporate investment banker Strain: Low Risk  (03/11/2022)   Overall Financial Resource Strain (CARDIA)    Difficulty of Paying Living Expenses: Not very hard  Food Insecurity: No Food Insecurity (01/13/2023)   Hunger Vital Sign    Worried About Running Out of Food in the Last Year: Never true    Ran Out of Food in the Last Year: Never true  Recent Concern: Food Insecurity - Food Insecurity Present (10/26/2022)   Hunger Vital Sign    Worried About Programme researcher, broadcasting/film/video in the Last Year: Often true    Ran Out of Food in the Last Year: Often true  Transportation Needs: Unmet Transportation Needs (01/06/2024)   PRAPARE - Administrator, Civil Service (Medical): Yes    Lack of Transportation (Non-Medical): Yes  Physical Activity: Not on file  Stress: Not on file  Social Connections: Not on file  Intimate Partner Violence: Not At Risk (01/13/2023)   Humiliation, Afraid, Rape, and Kick questionnaire    Fear of Current or Ex-Partner: No    Emotionally Abused: No    Physically Abused: No    Sexually Abused: No    Physical Exam      No future appointments.

## 2024-08-16 ENCOUNTER — Other Ambulatory Visit (HOSPITAL_COMMUNITY): Payer: Self-pay | Admitting: Family Medicine

## 2024-08-16 ENCOUNTER — Other Ambulatory Visit: Payer: Self-pay | Admitting: Pulmonary Disease

## 2024-08-16 ENCOUNTER — Other Ambulatory Visit: Payer: Self-pay | Admitting: Cardiology

## 2024-08-16 ENCOUNTER — Other Ambulatory Visit (HOSPITAL_COMMUNITY): Payer: Self-pay

## 2024-08-16 DIAGNOSIS — I5020 Unspecified systolic (congestive) heart failure: Secondary | ICD-10-CM

## 2024-08-16 MED ORDER — ISOSORB DINITRATE-HYDRALAZINE 20-37.5 MG PO TABS: 0.5000 | ORAL_TABLET | Freq: Three times a day (TID) | ORAL | 5 refills | Status: AC

## 2024-08-17 ENCOUNTER — Telehealth (HOSPITAL_COMMUNITY): Payer: Self-pay | Admitting: Emergency Medicine

## 2024-08-17 NOTE — Telephone Encounter (Signed)
 Scheduled appointment for Mr. Quin w/ Dr Annella w/ [facility] for 11/26@ 9:15.  Needs appt to be able to get refill on his [facility].  I will notifiy pt at next home visit.    Mary Sharps, EMT-Paramedic [phone] 08/17/2024

## 2024-08-18 ENCOUNTER — Other Ambulatory Visit (HOSPITAL_COMMUNITY): Payer: Self-pay | Admitting: Emergency Medicine

## 2024-08-18 NOTE — Progress Notes (Signed)
 Paramedicine Encounter    Patient ID: Marcus Tran, male    DOB: 1951/02/25, 73 y.o.   MRN: [id number]   Complaints NONE  Assessment A&O x 4, skin W&D w/ good color.  Pt. Denies chest pain or SOB.  Lung sounds clear throughout and no peripheral edema noted.  Compliance with meds YES  Pill box filled N/A (bubble packs)  Refills needed NONE  Meds changes since last visit NONE    Social changes NONE   BP 110/80 (BP Location: Right Arm, Patient Position: Sitting, Cuff Size: Normal)   Pulse 84   Resp 16   Wt 139 lb 9.6 oz (63.3 kg)   SpO2 94%   BMI 22.88 kg/m  Weight yesterday- Last visit weight-136  ACTION: Home visit completed  [name] [phone] 08/21/24  Patient Care Team: [name], NP as PCP - General Lavona Agent, MD as PCP - Cardiology (Cardiology) Rolan Ezra RAMAN, MD as PCP - Advanced Heart Failure (Cardiology) Hunsucker, Donnice SAUNDERS, MD as Consulting Physician (Pulmonary Disease)  Patient Active Problem List   Diagnosis Date Noted   Cocaine use 01/15/2023   Noncompliance 01/13/2023   Adenovirus pneumonia 11/12/2022   Chronic HFrEF (heart failure with reduced ejection fraction) (HCC) 08/13/2022   DM2 (diabetes mellitus, type 2) (HCC) 08/13/2022   Obstructive sleep apnea (adult) (pediatric) 08/08/2022   Acute respiratory failure with hypoxia and hypercapnia (HCC) 05/04/2022   Third degree heart block (HCC) 04/15/2022   Snoring 04/15/2022   Nonischemic cardiomyopathy (HCC) 04/15/2022   HFrEF (heart failure with reduced ejection fraction) (HCC) 04/15/2022   COPD with acute exacerbation (HCC) 03/22/2022   Chronic combined systolic and diastolic CHF (congestive heart failure) (HCC)    Tobacco use disorder 03/08/2022   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   HTN (hypertension) 03/08/2022   Stage 3a chronic kidney disease (CKD) (HCC) 03/08/2022   Coronary artery disease 03/08/2022    Current Outpatient Medications:     Acoramidis , 712MG  Twice Daily, (ATTRUBY ) 356 MG TBPK, Take 712 mg by mouth 2 (two) times daily., Disp: 112 each, Rfl: 11   albuterol  (VENTOLIN  HFA) 108 (90 Base) MCG/ACT inhaler, Inhale 2 puffs into the lungs every 4 (four) hours as needed for wheezing or shortness of breath., Disp: 1 each, Rfl: 2   atorvastatin  (LIPITOR ) 80 MG tablet, TAKE 1 Tablet BY MOUTH ONCE DAILY EVERY EVENING, Disp: 90 tablet, Rfl: 3   bisoprolol  (ZEBETA ) 5 MG tablet, Take 1 Tablet by mouth once daily (morning), Disp: 90 tablet, Rfl: 3   dapagliflozin  propanediol (FARXIGA ) 10 MG TABS tablet, Take 1 tablet (10 mg total) by mouth daily., Disp: 30 tablet, Rfl: 11   furosemide  (LASIX ) 20 MG tablet, Take 1 tablet (20 mg total) by mouth daily., Disp: 30 tablet, Rfl: 5   ipratropium-albuterol  (DUONEB) 0.5-2.5 (3) MG/3ML SOLN, Take 3 mLs by nebulization as needed., Disp: , Rfl:    isosorbide -hydrALAZINE  (BIDIL ) 20-37.5 MG tablet, Take 0.5 tablets by mouth 3 (three) times daily., Disp: 45 tablet, Rfl: 5   metFORMIN  (GLUCOPHAGE ) 500 MG tablet, Take 1 tablet (500 mg total) by mouth 2 (two) times daily with a meal., Disp: 60 tablet, Rfl: 0   montelukast  (SINGULAIR ) 10 MG tablet, Take 1 tablet (10 mg total) by mouth at bedtime., Disp: 30 tablet, Rfl: 2   sacubitril -valsartan  (ENTRESTO ) 24-26 MG, Take 1 tablet by mouth 2 (two) times daily., Disp: 60 tablet, Rfl: 11   TRELEGY ELLIPTA 100-62.5-25 MCG/ACT AEPB, Take 1 puff by mouth daily., Disp: ,  Rfl:    aspirin  EC 81 MG tablet, Take 1 tablet (81 mg total) by mouth daily. Swallow whole. (Patient not taking: Reported on 08/18/2024), Disp: , Rfl:    spironolactone  (ALDACTONE ) 25 MG tablet, TAKE 1 Tablet BY MOUTH ONCE DAILY (MORNING), Disp: 90 tablet, Rfl: 3 Allergies  Allergen Reactions   Vyndamax  [Tafamidis ] Dermatitis    Allergic dermatitis/rash     Social History   Socioeconomic History   Marital status: Single    Spouse name: Not on file   Number of children: 1   Years of  education: Not on file   Highest education level: 10th grade  Occupational History   Occupation: Disability  Tobacco Use   Smoking status: Some Days    Current packs/day: 0.00    Average packs/day: 0.5 packs/day for 50.0 years (25.0 ttl pk-yrs)    Types: Cigarettes    Start date: 03/19/1972    Last attempt to quit: 03/19/2022    Years since quitting: 2.4   Smokeless tobacco: Never   Tobacco comments:    Smoking cessation  Vaping Use   Vaping status: Never Used  Substance and Sexual Activity   Alcohol use: Not on file    Comment: socially   Drug use: Yes    Types: Marijuana    Comment: past   Sexual activity: Not on file  Other Topics Concern   Not on file  Social History Narrative   He lives with his daughter apparently and 5 grandchildren.  He reports that he smokes probably less than a pack of cigarettes a day.  He occasionally drinks a beer or liquor but not daily.   Social Drivers of Corporate Investment Banker Strain: Low Risk  (03/11/2022)   Overall Financial Resource Strain (CARDIA)    Difficulty of Paying Living Expenses: Not very hard  Food Insecurity: No Food Insecurity (01/13/2023)   Hunger Vital Sign    Worried About Running Out of Food in the Last Year: Never true    Ran Out of Food in the Last Year: Never true  Recent Concern: Food Insecurity - Food Insecurity Present (10/26/2022)   Hunger Vital Sign    Worried About Programme Researcher, Broadcasting/film/video in the Last Year: Often true    Ran Out of Food in the Last Year: Often true  Transportation Needs: Unmet Transportation Needs (01/06/2024)   PRAPARE - Administrator, Civil Service (Medical): Yes    Lack of Transportation (Non-Medical): Yes  Physical Activity: Not on file  Stress: Not on file  Social Connections: Not on file  Intimate Partner Violence: Not At Risk (01/13/2023)   Humiliation, Afraid, Rape, and Kick questionnaire    Fear of Current or Ex-Partner: No    Emotionally Abused: No    Physically Abused: No     Sexually Abused: No    Physical Exam      Future Appointments  Date Time Provider Department Center  09/13/2024  9:15 AM Hunsucker, Donnice SAUNDERS, MD [facility] [id number]

## 2024-08-23 ENCOUNTER — Other Ambulatory Visit: Payer: Self-pay

## 2024-08-23 ENCOUNTER — Other Ambulatory Visit (HOSPITAL_COMMUNITY): Payer: Self-pay | Admitting: Cardiology

## 2024-08-23 MED ORDER — ATTRUBY 356 MG PO TBPK
712.0000 mg | ORAL_TABLET | Freq: Two times a day (BID) | ORAL | 11 refills | Status: AC
  Filled 2024-08-23 – 2024-10-26 (×3): qty 112, 28d supply, fill #0
  Filled 2024-11-17: qty 112, 28d supply, fill #1

## 2024-08-25 ENCOUNTER — Other Ambulatory Visit: Payer: Self-pay

## 2024-08-29 ENCOUNTER — Other Ambulatory Visit: Payer: Self-pay

## 2024-08-31 ENCOUNTER — Other Ambulatory Visit (HOSPITAL_COMMUNITY): Payer: Self-pay | Admitting: Emergency Medicine

## 2024-08-31 NOTE — Progress Notes (Signed)
 Paramedicine Encounter    Patient ID: Marcus Tran, male    DOB: 1951/02/23, 73 y.o.   MRN: [id number]   Complaints NONE  Assessment A&O x 4, skin W&D w/ good color.  Denies chest pain or SOB.  Lung sounds clear bilat.  No peripheral edema noted.  Compliance with meds YES  Pill box filled n/a Bubble packs from [facility]  Refills needed NONE  Meds changes since last visit NONE    Social changes NONE   BP (!) 102/0 (BP Location: Right Arm, Patient Position: Sitting, Cuff Size: Normal) Comment: palpated  Pulse 70   Resp 16   SpO2 93%  Weight yesterday- Last visit weight-136lb  ACTION: Home visit completed  [name] [phone] 09/07/24  Patient Care Team: [name], NP as PCP - General Lavona Agent, MD as PCP - Cardiology (Cardiology) Rolan Ezra RAMAN, MD as PCP - Advanced Heart Failure (Cardiology) Hunsucker, Donnice SAUNDERS, MD as Consulting Physician (Pulmonary Disease)  Patient Active Problem List   Diagnosis Date Noted   Cocaine use 01/15/2023   Noncompliance 01/13/2023   Adenovirus pneumonia 11/12/2022   Chronic HFrEF (heart failure with reduced ejection fraction) (HCC) 08/13/2022   DM2 (diabetes mellitus, type 2) (HCC) 08/13/2022   Obstructive sleep apnea (adult) (pediatric) 08/08/2022   Acute respiratory failure with hypoxia and hypercapnia (HCC) 05/04/2022   Third degree heart block (HCC) 04/15/2022   Snoring 04/15/2022   Nonischemic cardiomyopathy (HCC) 04/15/2022   HFrEF (heart failure with reduced ejection fraction) (HCC) 04/15/2022   COPD with acute exacerbation (HCC) 03/22/2022   Chronic combined systolic and diastolic CHF (congestive heart failure) (HCC)    Tobacco use disorder 03/08/2022   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   HTN (hypertension) 03/08/2022   Stage 3a chronic kidney disease (CKD) (HCC) 03/08/2022   Coronary artery disease 03/08/2022    Current Outpatient Medications:    Acoramidis , 712MG  Twice  Daily, (ATTRUBY ) 356 MG TBPK, Take 712 mg by mouth 2 (two) times daily., Disp: 112 each, Rfl: 11   albuterol  (VENTOLIN  HFA) 108 (90 Base) MCG/ACT inhaler, Inhale 2 puffs into the lungs every 4 (four) hours as needed for wheezing or shortness of breath., Disp: 1 each, Rfl: 2   aspirin  EC 81 MG tablet, Take 1 tablet (81 mg total) by mouth daily. Swallow whole. (Patient not taking: Reported on 08/18/2024), Disp: , Rfl:    atorvastatin  (LIPITOR ) 80 MG tablet, TAKE 1 Tablet BY MOUTH ONCE DAILY EVERY EVENING, Disp: 90 tablet, Rfl: 3   bisoprolol  (ZEBETA ) 5 MG tablet, Take 1 Tablet by mouth once daily (morning), Disp: 90 tablet, Rfl: 3   dapagliflozin  propanediol (FARXIGA ) 10 MG TABS tablet, Take 1 tablet (10 mg total) by mouth daily., Disp: 30 tablet, Rfl: 11   furosemide  (LASIX ) 20 MG tablet, Take 1 tablet (20 mg total) by mouth daily., Disp: 30 tablet, Rfl: 5   ipratropium-albuterol  (DUONEB) 0.5-2.5 (3) MG/3ML SOLN, Take 3 mLs by nebulization as needed., Disp: , Rfl:    isosorbide -hydrALAZINE  (BIDIL ) 20-37.5 MG tablet, Take 0.5 tablets by mouth 3 (three) times daily., Disp: 45 tablet, Rfl: 5   metFORMIN  (GLUCOPHAGE ) 500 MG tablet, Take 1 tablet (500 mg total) by mouth 2 (two) times daily with a meal., Disp: 60 tablet, Rfl: 0   montelukast  (SINGULAIR ) 10 MG tablet, Take 1 tablet (10 mg total) by mouth at bedtime., Disp: 30 tablet, Rfl: 2   sacubitril -valsartan  (ENTRESTO ) 24-26 MG, Take 1 tablet by mouth 2 (two) times daily., Disp: 60  tablet, Rfl: 11   spironolactone  (ALDACTONE ) 25 MG tablet, TAKE 1 Tablet BY MOUTH ONCE DAILY (MORNING), Disp: 90 tablet, Rfl: 3   TRELEGY ELLIPTA 100-62.5-25 MCG/ACT AEPB, Take 1 puff by mouth daily., Disp: , Rfl:  Allergies  Allergen Reactions   Vyndamax  [Tafamidis ] Dermatitis    Allergic dermatitis/rash     Social History   Socioeconomic History   Marital status: Single    Spouse name: Not on file   Number of children: 1   Years of education: Not on file    Highest education level: 10th grade  Occupational History   Occupation: Disability  Tobacco Use   Smoking status: Some Days    Current packs/day: 0.00    Average packs/day: 0.5 packs/day for 50.0 years (25.0 ttl pk-yrs)    Types: Cigarettes    Start date: 03/19/1972    Last attempt to quit: 03/19/2022    Years since quitting: 2.4   Smokeless tobacco: Never   Tobacco comments:    Smoking cessation  Vaping Use   Vaping status: Never Used  Substance and Sexual Activity   Alcohol use: Not on file    Comment: socially   Drug use: Yes    Types: Marijuana    Comment: past   Sexual activity: Not on file  Other Topics Concern   Not on file  Social History Narrative   He lives with his daughter apparently and 5 grandchildren.  He reports that he smokes probably less than a pack of cigarettes a day.  He occasionally drinks a beer or liquor but not daily.   Social Drivers of Corporate Investment Banker Strain: Low Risk  (03/11/2022)   Overall Financial Resource Strain (CARDIA)    Difficulty of Paying Living Expenses: Not very hard  Food Insecurity: No Food Insecurity (01/13/2023)   Hunger Vital Sign    Worried About Running Out of Food in the Last Year: Never true    Ran Out of Food in the Last Year: Never true  Recent Concern: Food Insecurity - Food Insecurity Present (10/26/2022)   Hunger Vital Sign    Worried About Running Out of Food in the Last Year: Often true    Ran Out of Food in the Last Year: Often true  Transportation Needs: Unmet Transportation Needs (09/07/2024)   PRAPARE - Administrator, Civil Service (Medical): Yes    Lack of Transportation (Non-Medical): Yes  Physical Activity: Not on file  Stress: Not on file  Social Connections: Not on file  Intimate Partner Violence: Not At Risk (01/13/2023)   Humiliation, Afraid, Rape, and Kick questionnaire    Fear of Current or Ex-Partner: No    Emotionally Abused: No    Physically Abused: No    Sexually Abused: No     Physical Exam      Future Appointments  Date Time Provider Department Center  09/08/2024 11:00 AM [facility] PA/NP [facility] None  09/13/2024  9:15 AM Hunsucker, Donnice SAUNDERS, MD [facility] [phone] LELON Das

## 2024-09-06 ENCOUNTER — Other Ambulatory Visit (HOSPITAL_COMMUNITY): Payer: Self-pay | Admitting: Cardiology

## 2024-09-07 ENCOUNTER — Telehealth (HOSPITAL_COMMUNITY): Payer: Self-pay | Admitting: Licensed Clinical Social Worker

## 2024-09-07 ENCOUNTER — Telehealth (HOSPITAL_COMMUNITY): Payer: Self-pay

## 2024-09-07 ENCOUNTER — Other Ambulatory Visit (HOSPITAL_COMMUNITY): Payer: Self-pay | Admitting: Emergency Medicine

## 2024-09-07 NOTE — Progress Notes (Signed)
 ADVANCED HF CLINIC NOTE  PCP: [name], NP [facility] PCP  Primary Cardiologist: Lynwood Schilling, MD  HF Cardiology: Dr. Rolan   HPI: 73 y.o. [name] with hx tobacco use, COPD, OSA on Bipap, HTN, and chronic HFmrEF.   Admitted 5/23 with acute hypoxic respiratory failure secondary to suspected COPD and acute systolic CHF. Initially in respiratory distress and required BiPAP. BP significantly elevated at 189/133, remained elevated throughout much of admission but improved with titration of GDMT. CTA chest negative for PE. Echo EF 35%, RV okay, RVSP 54 mmHg. R/LHC showed nonobstructive CAD; RA mean 12, PA 54/29 (37), PCWP mean 23, LVEDP 22 mmHg, Fick CO/CI 3.84/2.12. He diuresed with IV lasix .    He was readmitted 6/23 with acute on chronic respiratory failure with hypoxia secondary to acute COPD exacerbation. Also thought to possibly have some component of CHF. He diuresed with IV lasix  and received doxycycline  + prednisone . Discharge weight 151 lb.    Zio monitor in 6/23 showed NSR with short runs NSVT and SVT, bradyarrhythmias, idioventricular rhythm, with episodes of third-degree HB noted during sleep hours.  Bradycardia thought to be due to untreated severe OSA.   Admitted 7/23 with acute respiratory failure with hypoxia. O2 sats 80s. He initially required BiPAP. Hypertensive with BP 210/120 and placed on nitro gtt. He was admitted for AECOPD and CHF. Diuresed with IV lasix . Cardiology consulted and felt presentation more likely consistent with AECOPD rather than CHF. Had runs of NSVT up to 20 beats in duration.   Echo 8/23 EF improved 40-45% with LVH. Recommended PYP/ CMRI  Sleep study 9/23 showed severe OSA with AHI of 42.6/hr. He declined in lab BiPap titration.  ED evaluation 12/23 for syncope. Low suspicion for PE. CXR ok. COVID negative. K 5.5. Suspected vasovagal episode during coughing fit.    Spironolactone /KCL stopped with recent hyperkalemia.   Admitted 1/24 for AECOPD.  Transiently required BiPap, then able to wean to nasal cannula. Found to have AKI and GDMT initially held then restarted per home regimen. Started on prednisone  taper. He was discharged home, weight 155 lbs.  Admitted 3/24 with COPD exacerbation. Required BiPap and steroids. Started on IV Solu-Medrol . UDS + cocaine. Spiro and Entresto  stopped with AKI.    Had PYP scan 6/24 --> Stongly suggestive of TTR. He was started on tafamidis  but developed severe rash/dermatitis after starting that improved when he stopped and came back when he restarted. He is now off tafamidis .   Echo 10/24 showed EF 40% with moderate LVH, global hypokinesis, mild RV enlargement with mildly decreased systolic function, IVC normal.   Seen in UC s/p fall 09/14/23. Underwent R distal thumb reduction.  cMRI 12/24 LVEF 40%, RVEF 46%, elevated ECV consistent with cardiac amyloidosis however LGE pattern not classic, consider cardiac sarcoidosis, extensive mid-wall LGE in the basal to mid septum, subepicardial LGE in the basal to mid inferior wall, and patchy mid-wall LGE in the basal to mid anterior wall.  PET scan 9/25 not consistent with active sarcoidosis  He returns today for heart failure follow up. Overall feeling well. NYHA II. SABRA Denies chest pain, dyspnea, near-syncope, palpitations, and dizziness. Able to perform ADLs. Appetite okay. Weight at home 135 lb.  Compliant with all medications. Smoking a pack per week or two. Follows with Paramedicine.  Labs (7/24): urine immunofixation negative, myeloma panel negative Labs (9/24): BNP 553, K 3.5, creatinine 1.75 Labs (10/24): K 4.1, creatinine 1.62 Labs (1/25): K 4.2, creatinine 3.9, LDL 55 Labs (3/25): K 4.4,  creatinine 1.85, BNP 71   ECG (personally reviewed): NSR, RBBB  PMH: 1. OSA: Severe. CPAP. 2. COPD 3. HTN 4. 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  - R/LHC (5/23): nonobstructive CAD; RA mean 12, PA 54/29  (37), PCWP mean 23, LVEDP 22 mmHg, Fick CO/CI 3.84/2.12. - Echo (5/23): EF 35%, RV okay, RVSP 54 mmHg. - Echo (8/23): EF 40-45% w/ LVH - Echo (10/24): EF 40% with moderate LVH, global hypokinesis, mild RV enlargement with mildly decreased systolic function, IVC normal.  - PYP scan (6/24): Grade 2, H/CL 1.3.  Looks abnormal visually.   - Invitae gene testing negative for TTR mutations.  - cMRI 12/24 LVEF 40%, RVEF 46%, elevated ECV consistent with cardiac amyloidosis, extensive mid-wall LGE in the basal to mid septum, subepicardial LGE in the basal to mid inferior wall, and patchy mid-wall LGE in the basal to mid anterior wall. 5. Hyperlipidemia 6. Bradycardia: Complete HB noted while sleeping, thought to be due to severe OSA.  7. CAD: LHC (5/23) with 50% OM1 stenosis.  8. Syncope: ?vasovagal 9. Type 2 diabetes.   Current Outpatient Medications  Medication Sig Dispense Refill   Acoramidis , 712MG  Twice Daily, (ATTRUBY ) 356 MG TBPK Take 712 mg by mouth 2 (two) times daily. 112 each 11   albuterol  (VENTOLIN  HFA) 108 (90 Base) MCG/ACT inhaler Inhale 2 puffs into the lungs every 4 (four) hours as needed for wheezing or shortness of breath. 1 each 2   atorvastatin  (LIPITOR ) 80 MG tablet TAKE 1 Tablet BY MOUTH ONCE DAILY EVERY EVENING 90 tablet 3   bisoprolol  (ZEBETA ) 5 MG tablet Take 1 Tablet by mouth once daily (morning) 90 tablet 3   dapagliflozin  propanediol (FARXIGA ) 10 MG TABS tablet Take 1 tablet (10 mg total) by mouth daily. 30 tablet 11   furosemide  (LASIX ) 20 MG tablet Take 1 tablet (20 mg total) by mouth daily. 30 tablet 5   ipratropium-albuterol  (DUONEB) 0.5-2.5 (3) MG/3ML SOLN Take 3 mLs by nebulization as needed.     isosorbide -hydrALAZINE  (BIDIL ) 20-37.5 MG tablet Take 0.5 tablets by mouth 3 (three) times daily. 45 tablet 5   metFORMIN  (GLUCOPHAGE ) 500 MG tablet Take 1 tablet (500 mg total) by mouth 2 (two) times daily with a meal. 60 tablet 0   montelukast  (SINGULAIR ) 10 MG tablet  Take 1 tablet (10 mg total) by mouth at bedtime. 30 tablet 2   sacubitril -valsartan  (ENTRESTO ) 24-26 MG Take 1 tablet by mouth 2 (two) times daily. 60 tablet 11   spironolactone  (ALDACTONE ) 25 MG tablet TAKE 1 Tablet BY MOUTH ONCE DAILY (MORNING) 90 tablet 3   TRELEGY ELLIPTA 100-62.5-25 MCG/ACT AEPB Take 1 puff by mouth daily.     aspirin  EC 81 MG tablet Take 1 tablet (81 mg total) by mouth daily. Swallow whole. (Patient not taking: Reported on 09/08/2024)     No current facility-administered medications for this encounter.   Allergies  Allergen Reactions   Vyndamax  [Tafamidis ] Dermatitis    Allergic dermatitis/rash    Social History   Socioeconomic History   Marital status: Single    Spouse name: Not on file   Number of children: 1   Years of education: Not on file   Highest education level: 10th grade  Occupational History   Occupation: Disability  Tobacco Use   Smoking status: Some Days    Current packs/day: 0.00    Average packs/day: 0.5 packs/day for 50.0 years (25.0 ttl pk-yrs)    Types: Cigarettes  Start date: 03/19/1972    Last attempt to quit: 03/19/2022    Years since quitting: 2.4   Smokeless tobacco: Never   Tobacco comments:    Smoking cessation  Vaping Use   Vaping status: Never Used  Substance and Sexual Activity   Alcohol use: Not on file    Comment: socially   Drug use: Yes    Types: Marijuana    Comment: past   Sexual activity: Not on file  Other Topics Concern   Not on file  Social History Narrative   He lives with his daughter apparently and 5 grandchildren.  He reports that he smokes probably less than a pack of cigarettes a day.  He occasionally drinks a beer or liquor but not daily.   Social Drivers of Corporate Investment Banker Strain: Low Risk  (03/11/2022)   Overall Financial Resource Strain (CARDIA)    Difficulty of Paying Living Expenses: Not very hard  Food Insecurity: No Food Insecurity (01/13/2023)   Hunger Vital Sign    Worried  About Running Out of Food in the Last Year: Never true    Ran Out of Food in the Last Year: Never true  Recent Concern: Food Insecurity - Food Insecurity Present (10/26/2022)   Hunger Vital Sign    Worried About Programme Researcher, Broadcasting/film/video in the Last Year: Often true    Ran Out of Food in the Last Year: Often true  Transportation Needs: Unmet Transportation Needs (09/07/2024)   PRAPARE - Administrator, Civil Service (Medical): Yes    Lack of Transportation (Non-Medical): Yes  Physical Activity: Not on file  Stress: Not on file  Social Connections: Not on file  Intimate Partner Violence: Not At Risk (01/13/2023)   Humiliation, Afraid, Rape, and Kick questionnaire    Fear of Current or Ex-Partner: No    Emotionally Abused: No    Physically Abused: No    Sexually Abused: No   Family History  Problem Relation Age of Onset   Hypertension Mother    BP (!) 92/54   Pulse 65   Ht 5' 5.5 (1.664 m)   Wt 63 kg (138 lb 12.8 oz)   SpO2 98%   BMI 22.75 kg/m   Wt Readings from Last 3 Encounters:  09/08/24 63 kg (138 lb 12.8 oz)  08/18/24 63.3 kg (139 lb 9.6 oz)  08/04/24 62 kg (136 lb 9.6 oz)   PHYSICAL EXAM: General: Well appearing. No distress  Cardiac: JVP flat. No murmurs  Resp: Lung sounds clear and equal B/L Extremities: Warm and dry.  No edema.  Neuro: A&O x3. Affect flat.   ASSESSMENT & PLAN:  1.  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 Echo (5/23) with LVEF 35%, RV okay.  [facility] 5/23 showed nonobstructive CAD; RA mean 12, PA 54/29 (37), PCWP mean 23, LVEDP 22 mmHg. Echo in 8/23 with EF 40-45%.  Echo in 10/24 showed EF 40% with moderate LVH, global hypokinesis, mild RV enlargement with mildly decreased systolic function, IVC normal. PYP scan concerning for TTR cardiac amyloidosis. The PYP scan was not read as floridly positive, but visually appeared positive. Myeloma studies were negative.  Invitae gene testing negative for TTR  gene mutations.  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   He did not tolerate tafamidis  due to severe rash and was transitioned to acoramidis .  Cardiac MRI was done in 12/24, showing LVEF 40%, RVEF 46%, elevated ECV 40% consistent with  cardiac amyloidosis, extensive mid-wall LGE in the basal to mid septum, subepicardial LGE in the basal to mid inferior wall, and patchy mid-wall LGE in the basal to mid anterior wall. Elevated ECV percentage on cardiac MRI was consistent with cardiac amyloidosis though LGE pattern was not classic for amyloidosis and could also be seen with cardiac sarcoidosis or an arrhythmogenic cardiomyopathy.  Cardiac PET 9/25 without evidence of active sarcoidosis. HRCT needs to be complete. - NYHA class II, euvolemic. GDMT limited by CKD3. - Will send for genetic counseling for cardiomyopathies. If questions remain about his diagnosis after these studies, endomyocardial biopsy would be appropriate.  - He is now on acoramidis . - Continue spironolactone  25 mg daily. BMET today - Continue Entresto  24/26 mg bid.  - Continue Lasix  20 mg every other day.  - Continue Farxiga  10 mg daily.  - Continue bisoprolol  5 mg daily, low threshold to cut back with history of bradycardia. - Continue BiDil  0.5 tab tid. 2. Syncope:  Multiple episodes in the past. Evaluated in the ED 12/23. Event felt to by vasovagal.  2 week Zio (6/23) showed mostly NSR, significant bradycardia during sleeping hours w/ intermittent CHB thought to be due to severe OSA. No dizziness/lightheadedness - Low threshold to cut back on bisoprolol .  - Continue to use CPAP.  3. HTN: BP stable - Continue current meds. 4. OSA: Severe by sleep study.   - Continue to use CPAP.  5. CAD: Nonobstructive on [facility] 5/23.  No chest pain.  - Continue aspirin  and statin. 6. COPD: Still smoking. Has history of COPD exacerbations though minimally symptomatic at this time.  - Discussed smoking cessation.  - On Trelegy. Followed  by Pulmonary   Follow up in 4 month with Dr. McLean  Sheddrick Lattanzio, NP 09/08/24

## 2024-09-07 NOTE — Progress Notes (Signed)
 Dropped off 2 food boxes from [facility]&V for Thanksgiving.  Pt. Very appreciative for the food.  Pt. Reports to be feeling well.  He has an appointment w/ Heart and V[facility] on 11/21 @ 11:00.  Advised him that Jenna Uris has arranged transportation via cab and it should arrive at 10:30 for pick up.  He acknowledges he understands same.    Mary Sharps, EMT-Paramedic [phone] 09/07/2024

## 2024-09-07 NOTE — Telephone Encounter (Signed)
 [facility]&V Care Navigation CSW Progress Note  Clinical Social Worker consulted to assist with transportation to appt tomorrow.  CSW arranged taxi through [place] with pick up at 10:30am.  Paramedic informed and will pass along to patient.  SDOH Screenings   Food Insecurity: No Food Insecurity (01/13/2023)  Recent Concern: Food Insecurity - Food Insecurity Present (10/26/2022)  Housing: Medium Risk (09/24/2023)  Transportation Needs: Unmet Transportation Needs (09/07/2024)  Utilities: Not At Risk (01/13/2023)  Alcohol Screen: Low Risk  (05/06/2022)  Financial Resource Strain: Low Risk  (03/11/2022)  Tobacco Use: High Risk (06/14/2024)   Andriette HILARIO Leech, LCSW Clinical Social Worker [facility] Desk#: [phone] Cell#: [phone]

## 2024-09-07 NOTE — Telephone Encounter (Signed)
 Called to confirm/remind patient of their appointment at the [facility] on 09/08/24 11:00.   Appointment:   [x] Confirmed  [] Left mess   [] No answer/No voice mail  [] VM Full/unable to leave message  [] Phone not in service  Patient reminded to bring all medications and/or complete list.  Confirmed patient has transportation. Gave directions, instructed to utilize valet parking.

## 2024-09-08 ENCOUNTER — Encounter (HOSPITAL_COMMUNITY): Payer: Self-pay

## 2024-09-08 ENCOUNTER — Ambulatory Visit (HOSPITAL_COMMUNITY)
Admission: RE | Admit: 2024-09-08 | Discharge: 2024-09-08 | Disposition: A | Discharge status: Home / Self Care | Source: Ambulatory Visit | Attending: Cardiology | Admitting: Cardiology

## 2024-09-08 ENCOUNTER — Ambulatory Visit (HOSPITAL_COMMUNITY): Payer: Self-pay | Admitting: Cardiology

## 2024-09-08 VITALS — BP 92/54 | HR 65 | Ht 65.5 in | Wt 138.8 lb

## 2024-09-08 DIAGNOSIS — I130 Hypertensive heart and chronic kidney disease with heart failure and stage 1 through stage 4 chronic kidney disease, or unspecified chronic kidney disease: Secondary | ICD-10-CM | POA: Insufficient documentation

## 2024-09-08 DIAGNOSIS — F17210 Nicotine dependence, cigarettes, uncomplicated: Secondary | ICD-10-CM | POA: Diagnosis not present

## 2024-09-08 DIAGNOSIS — I2510 Atherosclerotic heart disease of native coronary artery without angina pectoris: Secondary | ICD-10-CM | POA: Insufficient documentation

## 2024-09-08 DIAGNOSIS — Z79899 Other long term (current) drug therapy: Secondary | ICD-10-CM | POA: Diagnosis not present

## 2024-09-08 DIAGNOSIS — Z720 Tobacco use: Secondary | ICD-10-CM

## 2024-09-08 DIAGNOSIS — Z7982 Long term (current) use of aspirin: Secondary | ICD-10-CM | POA: Insufficient documentation

## 2024-09-08 DIAGNOSIS — G4733 Obstructive sleep apnea (adult) (pediatric): Secondary | ICD-10-CM | POA: Diagnosis not present

## 2024-09-08 DIAGNOSIS — J449 Chronic obstructive pulmonary disease, unspecified: Secondary | ICD-10-CM | POA: Insufficient documentation

## 2024-09-08 DIAGNOSIS — Z8249 Family history of ischemic heart disease and other diseases of the circulatory system: Secondary | ICD-10-CM | POA: Insufficient documentation

## 2024-09-08 DIAGNOSIS — Z5982 Transportation insecurity: Secondary | ICD-10-CM | POA: Diagnosis not present

## 2024-09-08 DIAGNOSIS — I5022 Chronic systolic (congestive) heart failure: Secondary | ICD-10-CM | POA: Insufficient documentation

## 2024-09-08 DIAGNOSIS — I428 Other cardiomyopathies: Secondary | ICD-10-CM | POA: Diagnosis not present

## 2024-09-08 DIAGNOSIS — Z87898 Personal history of other specified conditions: Secondary | ICD-10-CM

## 2024-09-08 LAB — BASIC METABOLIC PANEL WITH GFR
Anion gap: 11 (ref 5–15)
BUN: 46 mg/dL — ABNORMAL HIGH (ref 8–23)
CO2: 21 mmol/L — ABNORMAL LOW (ref 22–32)
Calcium: 8.7 mg/dL — ABNORMAL LOW (ref 8.9–10.3)
Chloride: 99 mmol/L (ref 98–111)
Creatinine, Ser: 2.88 mg/dL — ABNORMAL HIGH (ref 0.61–1.24)
GFR, Estimated: 22 mL/min — ABNORMAL LOW (ref >60)
Glucose, Bld: 99 mg/dL (ref 70–99)
Potassium: 4.2 mmol/L (ref 3.5–5.1)
Sodium: 131 mmol/L — ABNORMAL LOW (ref 135–145)

## 2024-09-08 NOTE — Patient Instructions (Signed)
 Medication Changes:  No Changes In Medications at this time.   Lab Work:  Labs done today, your results will be available in MyChart, we will contact you for abnormal readings  Referrals:  YOU HAVE BEEN REFERRED TO GENETICS COUNSELING THEY WILL REACH OUT TO YOU OR CALL TO ARRANGE THIS. PLEASE CALL US  WITH ANY CONCERNS   Follow-Up in: 4 MONTHS PLEASE CALL OUR OFFICE AROUND FEBRUARY  TO GET SCHEDULED FOR YOUR APPOINTMENT. PHONE NUMBER IS [phone] OPTION 2   At the [facility], you and your health needs are our priority. We have a designated team specialized in the treatment of Heart Failure. This Care Team includes your primary Heart Failure Specialized Cardiologist (physician), Advanced Practice Providers (APPs- Physician Assistants and Nurse Practitioners), and Pharmacist who all work together to provide you with the care you need, when you need it.   You may see any of the following providers on your designated Care Team at your next follow up:  Dr. Toribio Fuel Dr. Ezra Shuck Dr. Odis Brownie Greig Mosses, NP Caffie Shed, [place] [facility] [place], [place] Beckey Coe, NP Jordan Lee, NP Tinnie Redman, PharmD   Please be sure to bring in all your medications bottles to every appointment.   Need to Contact Us :  If you have any questions or concerns before your next appointment please send us  a message through [place] or call our office at [phone].    TO LEAVE A MESSAGE FOR THE NURSE SELECT OPTION 2, PLEASE LEAVE A MESSAGE INCLUDING: YOUR NAME DATE OF BIRTH CALL BACK NUMBER REASON FOR CALL**this is important as we prioritize the call backs  YOU WILL RECEIVE A CALL BACK THE SAME DAY AS LONG AS YOU CALL BEFORE 4:00 PM

## 2024-09-11 ENCOUNTER — Other Ambulatory Visit (HOSPITAL_COMMUNITY): Payer: Self-pay | Admitting: Family Medicine

## 2024-09-11 DIAGNOSIS — I5020 Unspecified systolic (congestive) heart failure: Secondary | ICD-10-CM

## 2024-09-12 ENCOUNTER — Other Ambulatory Visit (HOSPITAL_COMMUNITY): Payer: Self-pay | Admitting: Emergency Medicine

## 2024-09-12 NOTE — Progress Notes (Unsigned)
 Med rec only.  Discontinued Entresto .  Held Spironolactone  x 2 days then restarted 12.5mg . daily.  Will schedule BMET in 7 days.    Mary Sharps, EMT-Paramedic [phone] 09/12/2024

## 2024-09-13 ENCOUNTER — Ambulatory Visit: Admitting: Pulmonary Disease

## 2024-09-13 ENCOUNTER — Encounter: Payer: Self-pay | Admitting: Pulmonary Disease

## 2024-09-13 ENCOUNTER — Telehealth (HOSPITAL_COMMUNITY): Payer: Self-pay | Admitting: *Deleted

## 2024-09-13 DIAGNOSIS — I5022 Chronic systolic (congestive) heart failure: Secondary | ICD-10-CM

## 2024-09-13 DIAGNOSIS — I5020 Unspecified systolic (congestive) heart failure: Secondary | ICD-10-CM

## 2024-09-13 MED ORDER — SPIRONOLACTONE 25 MG PO TABS: 12.5000 mg | ORAL_TABLET | Freq: Every day | ORAL | 3 refills | Status: DC

## 2024-09-13 NOTE — Telephone Encounter (Signed)
 Called [facility] with following per Jordan Lee, NP:  Creatinine up. Stop entresto . Hold spiro for 2 days. Then restart at 12.5 mg daily. Repeat BMET in 7 days.  She verbalized understanding of same with changes made today with his medications. Repeat lab ordered and scheduled. Updated Rx sent to local pharmacy requesting bubble pack.

## 2024-09-22 ENCOUNTER — Ambulatory Visit (HOSPITAL_COMMUNITY)
Admission: RE | Admit: 2024-09-22 | Discharge: 2024-09-22 | Disposition: A | Source: Ambulatory Visit | Attending: Cardiology

## 2024-09-22 DIAGNOSIS — I5020 Unspecified systolic (congestive) heart failure: Secondary | ICD-10-CM

## 2024-09-22 DIAGNOSIS — I5022 Chronic systolic (congestive) heart failure: Secondary | ICD-10-CM

## 2024-09-22 LAB — BASIC METABOLIC PANEL WITH GFR
Anion gap: 11 (ref 5–15)
BUN: 27 mg/dL — ABNORMAL HIGH (ref 8–23)
CO2: 27 mmol/L (ref 22–32)
Calcium: 8.9 mg/dL (ref 8.9–10.3)
Chloride: 99 mmol/L (ref 98–111)
Creatinine, Ser: 2.25 mg/dL — ABNORMAL HIGH (ref 0.61–1.24)
GFR, Estimated: 30 mL/min — ABNORMAL LOW (ref >60)
Glucose, Bld: 94 mg/dL (ref 70–99)
Potassium: 5.2 mmol/L — ABNORMAL HIGH (ref 3.5–5.1)
Sodium: 137 mmol/L (ref 135–145)

## 2024-09-25 ENCOUNTER — Ambulatory Visit (HOSPITAL_COMMUNITY): Payer: Self-pay | Admitting: Cardiology

## 2024-09-28 ENCOUNTER — Other Ambulatory Visit (HOSPITAL_COMMUNITY): Payer: Self-pay | Admitting: Emergency Medicine

## 2024-10-03 ENCOUNTER — Telehealth (HOSPITAL_COMMUNITY): Payer: Self-pay | Admitting: Emergency Medicine

## 2024-10-03 NOTE — Progress Notes (Signed)
 Delivered pt bubble packs.  Reviewed packs for accuracy and reviewed w/ pt how to take.  Furosemide  and ASA are taken from separate bottles.  Pt w/o complaints.  Denies chest pain or SOB.  Mary Sharps, EMT-Paramedic [phone] 10/03/2024

## 2024-10-03 NOTE — Telephone Encounter (Signed)
 Set up transportation for lab appointment 12/22 @ 10:00. Transportation to arrive @ 9:21at [address] Confirmation #[id number] (pick up)  Return confirm # [id number]    Mary Sharps, EMT-Paramedic [phone] 10/03/2024

## 2024-10-09 ENCOUNTER — Ambulatory Visit (HOSPITAL_COMMUNITY)
Admission: RE | Admit: 2024-10-09 | Discharge: 2024-10-09 | Disposition: A | Discharge status: Home / Self Care | Source: Ambulatory Visit | Attending: Internal Medicine | Admitting: Internal Medicine

## 2024-10-09 ENCOUNTER — Ambulatory Visit (HOSPITAL_COMMUNITY): Payer: Self-pay | Admitting: Cardiology

## 2024-10-09 DIAGNOSIS — I5022 Chronic systolic (congestive) heart failure: Secondary | ICD-10-CM | POA: Insufficient documentation

## 2024-10-09 LAB — BASIC METABOLIC PANEL WITH GFR
Anion gap: 9 (ref 5–15)
BUN: 29 mg/dL — ABNORMAL HIGH (ref 8–23)
CO2: 23 mmol/L (ref 22–32)
Calcium: 8.8 mg/dL — ABNORMAL LOW (ref 8.9–10.3)
Chloride: 101 mmol/L (ref 98–111)
Creatinine, Ser: 2.07 mg/dL — ABNORMAL HIGH (ref 0.61–1.24)
GFR, Estimated: 33 mL/min — ABNORMAL LOW
Glucose, Bld: 95 mg/dL (ref 70–99)
Potassium: 4.1 mmol/L (ref 3.5–5.1)
Sodium: 133 mmol/L — ABNORMAL LOW (ref 135–145)

## 2024-10-10 ENCOUNTER — Other Ambulatory Visit (HOSPITAL_COMMUNITY): Payer: Self-pay

## 2024-10-10 ENCOUNTER — Telehealth (HOSPITAL_COMMUNITY): Payer: Self-pay

## 2024-10-10 NOTE — Telephone Encounter (Signed)
 Advanced Heart Failure Patient Advocate Encounter  Prior authorization for Attruby  has been submitted and approved. Test billing returns $0 for 28 day supply.  KeyBETHA FU Effective: 10/10/2024 to 10/18/2025  Rachel DEL, CPhT Rx Patient Advocate Phone: [phone]

## 2024-10-16 ENCOUNTER — Other Ambulatory Visit (HOSPITAL_COMMUNITY): Payer: Self-pay

## 2024-10-20 ENCOUNTER — Other Ambulatory Visit (HOSPITAL_COMMUNITY): Payer: Self-pay | Admitting: Emergency Medicine

## 2024-10-22 NOTE — Progress Notes (Signed)
 Picked up and delivered Marcus Tran bubble pack from [facility].  Reviewed bubble packs for accuracy. He is no longer taking Spiro or Entresto . He denies chest pain or SOB.  Lung sounds clear bilat.  Pt advises he is out of Attruby .  Advised him that his has been run through his insurance and been approved again.  He should receive this med in the next few days. No vitals taken during this visit due to time contraints.    Mary Sharps, EMT-Paramedic [phone] 10/22/2024

## 2024-10-25 ENCOUNTER — Ambulatory Visit: Admitting: Pulmonary Disease

## 2024-10-26 ENCOUNTER — Other Ambulatory Visit (HOSPITAL_COMMUNITY): Payer: Self-pay

## 2024-10-26 ENCOUNTER — Emergency Department (HOSPITAL_COMMUNITY)

## 2024-10-26 ENCOUNTER — Inpatient Hospital Stay (HOSPITAL_COMMUNITY)
Admission: EM | Admit: 2024-10-26 | Discharge: 2024-10-28 | DRG: 189 | Disposition: A | Attending: Internal Medicine | Admitting: Internal Medicine

## 2024-10-26 ENCOUNTER — Other Ambulatory Visit: Payer: Self-pay

## 2024-10-26 ENCOUNTER — Telehealth (HOSPITAL_COMMUNITY): Payer: Self-pay | Admitting: Emergency Medicine

## 2024-10-26 DIAGNOSIS — G4733 Obstructive sleep apnea (adult) (pediatric): Secondary | ICD-10-CM | POA: Diagnosis present

## 2024-10-26 DIAGNOSIS — I5021 Acute systolic (congestive) heart failure: Secondary | ICD-10-CM | POA: Diagnosis present

## 2024-10-26 DIAGNOSIS — J9601 Acute respiratory failure with hypoxia: Secondary | ICD-10-CM | POA: Diagnosis not present

## 2024-10-26 DIAGNOSIS — I442 Atrioventricular block, complete: Secondary | ICD-10-CM | POA: Diagnosis present

## 2024-10-26 DIAGNOSIS — Z555 Less than a high school diploma: Secondary | ICD-10-CM

## 2024-10-26 DIAGNOSIS — I5023 Acute on chronic systolic (congestive) heart failure: Secondary | ICD-10-CM | POA: Diagnosis present

## 2024-10-26 DIAGNOSIS — Z79899 Other long term (current) drug therapy: Secondary | ICD-10-CM

## 2024-10-26 DIAGNOSIS — Z5982 Transportation insecurity: Secondary | ICD-10-CM

## 2024-10-26 DIAGNOSIS — N1831 Chronic kidney disease, stage 3a: Secondary | ICD-10-CM | POA: Diagnosis present

## 2024-10-26 DIAGNOSIS — I2489 Other forms of acute ischemic heart disease: Secondary | ICD-10-CM | POA: Diagnosis present

## 2024-10-26 DIAGNOSIS — Z7982 Long term (current) use of aspirin: Secondary | ICD-10-CM

## 2024-10-26 DIAGNOSIS — E1122 Type 2 diabetes mellitus with diabetic chronic kidney disease: Secondary | ICD-10-CM | POA: Diagnosis present

## 2024-10-26 DIAGNOSIS — I43 Cardiomyopathy in diseases classified elsewhere: Secondary | ICD-10-CM | POA: Diagnosis present

## 2024-10-26 DIAGNOSIS — F17210 Nicotine dependence, cigarettes, uncomplicated: Secondary | ICD-10-CM | POA: Diagnosis present

## 2024-10-26 DIAGNOSIS — I428 Other cardiomyopathies: Secondary | ICD-10-CM | POA: Diagnosis present

## 2024-10-26 DIAGNOSIS — J9811 Atelectasis: Secondary | ICD-10-CM | POA: Diagnosis present

## 2024-10-26 DIAGNOSIS — J441 Chronic obstructive pulmonary disease with (acute) exacerbation: Secondary | ICD-10-CM | POA: Diagnosis not present

## 2024-10-26 DIAGNOSIS — E785 Hyperlipidemia, unspecified: Secondary | ICD-10-CM | POA: Diagnosis present

## 2024-10-26 DIAGNOSIS — I120 Hypertensive chronic kidney disease with stage 5 chronic kidney disease or end stage renal disease: Secondary | ICD-10-CM | POA: Diagnosis present

## 2024-10-26 DIAGNOSIS — Z8249 Family history of ischemic heart disease and other diseases of the circulatory system: Secondary | ICD-10-CM

## 2024-10-26 DIAGNOSIS — I2510 Atherosclerotic heart disease of native coronary artery without angina pectoris: Secondary | ICD-10-CM | POA: Diagnosis present

## 2024-10-26 DIAGNOSIS — F17200 Nicotine dependence, unspecified, uncomplicated: Secondary | ICD-10-CM | POA: Diagnosis present

## 2024-10-26 DIAGNOSIS — Z7984 Long term (current) use of oral hypoglycemic drugs: Secondary | ICD-10-CM

## 2024-10-26 DIAGNOSIS — Z1152 Encounter for screening for COVID-19: Secondary | ICD-10-CM

## 2024-10-26 DIAGNOSIS — I452 Bifascicular block: Secondary | ICD-10-CM | POA: Diagnosis present

## 2024-10-26 DIAGNOSIS — I10 Essential (primary) hypertension: Secondary | ICD-10-CM | POA: Diagnosis present

## 2024-10-26 DIAGNOSIS — E119 Type 2 diabetes mellitus without complications: Secondary | ICD-10-CM

## 2024-10-26 DIAGNOSIS — E854 Organ-limited amyloidosis: Secondary | ICD-10-CM | POA: Diagnosis present

## 2024-10-26 DIAGNOSIS — Z888 Allergy status to other drugs, medicaments and biological substances status: Secondary | ICD-10-CM

## 2024-10-26 DIAGNOSIS — I472 Ventricular tachycardia, unspecified: Secondary | ICD-10-CM | POA: Diagnosis present

## 2024-10-26 DIAGNOSIS — N185 Chronic kidney disease, stage 5: Secondary | ICD-10-CM | POA: Diagnosis present

## 2024-10-26 LAB — PRO BRAIN NATRIURETIC PEPTIDE: Pro Brain Natriuretic Peptide: 6268 pg/mL — ABNORMAL HIGH

## 2024-10-26 LAB — CBC WITH DIFFERENTIAL/PLATELET
Abs Immature Granulocytes: 0.01 K/uL (ref 0.00–0.07)
Basophils Absolute: 0 K/uL (ref 0.0–0.1)
Basophils Relative: 1 %
Eosinophils Absolute: 0.2 K/uL (ref 0.0–0.5)
Eosinophils Relative: 5 %
HCT: 38 % — ABNORMAL LOW (ref 39.0–52.0)
Hemoglobin: 12.6 g/dL — ABNORMAL LOW (ref 13.0–17.0)
Immature Granulocytes: 0 %
Lymphocytes Relative: 19 %
Lymphs Abs: 0.9 K/uL (ref 0.7–4.0)
MCH: 30.2 pg (ref 26.0–34.0)
MCHC: 33.2 g/dL (ref 30.0–36.0)
MCV: 91.1 fL (ref 80.0–100.0)
Monocytes Absolute: 0.4 K/uL (ref 0.1–1.0)
Monocytes Relative: 9 %
Neutro Abs: 3 K/uL (ref 1.7–7.7)
Neutrophils Relative %: 66 %
Platelets: 210 K/uL (ref 150–400)
RBC: 4.17 MIL/uL — ABNORMAL LOW (ref 4.22–5.81)
RDW: 14.7 % (ref 11.5–15.5)
WBC: 4.5 K/uL (ref 4.0–10.5)
nRBC: 0 % (ref 0.0–0.2)

## 2024-10-26 LAB — COMPREHENSIVE METABOLIC PANEL WITH GFR
ALT: 67 U/L — ABNORMAL HIGH (ref 0–44)
AST: 70 U/L — ABNORMAL HIGH (ref 15–41)
Albumin: 4 g/dL (ref 3.5–5.0)
Alkaline Phosphatase: 141 U/L — ABNORMAL HIGH (ref 38–126)
Anion gap: 12 (ref 5–15)
BUN: 32 mg/dL — ABNORMAL HIGH (ref 8–23)
CO2: 26 mmol/L (ref 22–32)
Calcium: 9 mg/dL (ref 8.9–10.3)
Chloride: 99 mmol/L (ref 98–111)
Creatinine, Ser: 1.93 mg/dL — ABNORMAL HIGH (ref 0.61–1.24)
GFR, Estimated: 36 mL/min — ABNORMAL LOW
Glucose, Bld: 113 mg/dL — ABNORMAL HIGH (ref 70–99)
Potassium: 3.7 mmol/L (ref 3.5–5.1)
Sodium: 136 mmol/L (ref 135–145)
Total Bilirubin: 1.4 mg/dL — ABNORMAL HIGH (ref 0.0–1.2)
Total Protein: 7.2 g/dL (ref 6.5–8.1)

## 2024-10-26 LAB — BLOOD GAS, VENOUS
Acid-Base Excess: 4 mmol/L — ABNORMAL HIGH (ref 0.0–2.0)
Bicarbonate: 29.2 mmol/L — ABNORMAL HIGH (ref 20.0–28.0)
O2 Saturation: 95.4 %
Patient temperature: 37
pCO2, Ven: 45 mmHg (ref 44–60)
pH, Ven: 7.42 (ref 7.25–7.43)
pO2, Ven: 75 mmHg — ABNORMAL HIGH (ref 32–45)

## 2024-10-26 LAB — RESP PANEL BY RT-PCR (RSV, FLU A&B, COVID)  RVPGX2
Influenza A by PCR: NEGATIVE
Influenza B by PCR: NEGATIVE
Resp Syncytial Virus by PCR: NEGATIVE
SARS Coronavirus 2 by RT PCR: NEGATIVE

## 2024-10-26 LAB — TROPONIN T, HIGH SENSITIVITY
Troponin T High Sensitivity: 49 ng/L — ABNORMAL HIGH (ref 0–19)
Troponin T High Sensitivity: 46 ng/L — ABNORMAL HIGH (ref 0–19)

## 2024-10-26 LAB — ACETAMINOPHEN LEVEL: Acetaminophen (Tylenol), Serum: <10 ug/mL — ABNORMAL LOW (ref 10–30)

## 2024-10-26 MED ORDER — METHYLPREDNISOLONE SODIUM SUCC 125 MG IJ SOLR
125.0000 mg | Freq: Once | INTRAMUSCULAR | Status: AC
  Administered 2024-10-26: 125 mg via INTRAVENOUS
  Filled 2024-10-26: qty 2

## 2024-10-26 MED ORDER — SODIUM CHLORIDE 0.9 % IV SOLN
1.0000 g | Freq: Once | INTRAVENOUS | Status: AC
  Administered 2024-10-26: 1 g via INTRAVENOUS
  Filled 2024-10-26: qty 10

## 2024-10-26 MED ORDER — POTASSIUM CHLORIDE CRYS ER 20 MEQ PO TBCR
20.0000 meq | EXTENDED_RELEASE_TABLET | Freq: Once | ORAL | Status: AC
  Administered 2024-10-26: 20 meq via ORAL
  Filled 2024-10-26: qty 1

## 2024-10-26 MED ORDER — SODIUM CHLORIDE 0.9 % IV SOLN: 250.0000 mL | INTRAVENOUS | Status: AC | PRN

## 2024-10-26 MED ORDER — PREDNISONE 20 MG PO TABS
40.0000 mg | ORAL_TABLET | Freq: Every day | ORAL | Status: DC
  Administered 2024-10-27 – 2024-10-28 (×2): 40 mg via ORAL
  Filled 2024-10-26 (×2): qty 2

## 2024-10-26 MED ORDER — ACETAMINOPHEN 325 MG PO TABS: 650.0000 mg | ORAL_TABLET | ORAL | Status: DC | PRN

## 2024-10-26 MED ORDER — FUROSEMIDE 10 MG/ML IJ SOLN
40.0000 mg | Freq: Once | INTRAMUSCULAR | Status: AC
  Administered 2024-10-26: 40 mg via INTRAVENOUS
  Filled 2024-10-26: qty 4

## 2024-10-26 MED ORDER — SODIUM CHLORIDE 0.9% FLUSH
3.0000 mL | Freq: Two times a day (BID) | INTRAVENOUS | Status: DC
  Administered 2024-10-26 – 2024-10-28 (×3): 3 mL via INTRAVENOUS

## 2024-10-26 MED ORDER — FUROSEMIDE 10 MG/ML IJ SOLN
40.0000 mg | Freq: Two times a day (BID) | INTRAMUSCULAR | Status: DC
  Administered 2024-10-27 – 2024-10-28 (×3): 40 mg via INTRAVENOUS
  Filled 2024-10-26 (×3): qty 4

## 2024-10-26 MED ORDER — IPRATROPIUM-ALBUTEROL 0.5-2.5 (3) MG/3ML IN SOLN
3.0000 mL | Freq: Four times a day (QID) | RESPIRATORY_TRACT | Status: DC
  Administered 2024-10-26 – 2024-10-27 (×2): 3 mL via RESPIRATORY_TRACT
  Filled 2024-10-26 (×2): qty 3

## 2024-10-26 MED ORDER — SODIUM CHLORIDE 0.9% FLUSH: 3.0000 mL | INTRAVENOUS | Status: DC | PRN

## 2024-10-26 MED ORDER — HYDRALAZINE HCL 20 MG/ML IJ SOLN
10.0000 mg | Freq: Four times a day (QID) | INTRAMUSCULAR | Status: DC | PRN
  Administered 2024-10-26: 10 mg via INTRAVENOUS
  Filled 2024-10-26: qty 1

## 2024-10-26 MED ORDER — HEPARIN SODIUM (PORCINE) 5000 UNIT/ML IJ SOLN
5000.0000 [IU] | Freq: Three times a day (TID) | INTRAMUSCULAR | Status: DC
  Administered 2024-10-26 – 2024-10-28 (×5): 5000 [IU] via SUBCUTANEOUS
  Filled 2024-10-26 (×5): qty 1

## 2024-10-26 MED ORDER — ASPIRIN 81 MG PO TBEC
81.0000 mg | DELAYED_RELEASE_TABLET | Freq: Every day | ORAL | Status: DC
  Administered 2024-10-27 – 2024-10-28 (×2): 81 mg via ORAL
  Filled 2024-10-26 (×2): qty 1

## 2024-10-26 MED ORDER — BUDESONIDE 0.25 MG/2ML IN SUSP
0.2500 mg | Freq: Two times a day (BID) | RESPIRATORY_TRACT | Status: DC
  Administered 2024-10-26 – 2024-10-28 (×4): 0.25 mg via RESPIRATORY_TRACT
  Filled 2024-10-26 (×4): qty 2

## 2024-10-26 MED ORDER — DOXYCYCLINE HYCLATE 100 MG PO TABS
100.0000 mg | ORAL_TABLET | Freq: Two times a day (BID) | ORAL | Status: DC
  Administered 2024-10-27 – 2024-10-28 (×3): 100 mg via ORAL
  Filled 2024-10-26 (×3): qty 1

## 2024-10-26 MED ORDER — SODIUM CHLORIDE 0.9 % IV SOLN: 500.0000 mg | Freq: Once | INTRAVENOUS | Status: DC

## 2024-10-26 MED ORDER — IPRATROPIUM-ALBUTEROL 0.5-2.5 (3) MG/3ML IN SOLN
3.0000 mL | RESPIRATORY_TRACT | Status: AC
  Administered 2024-10-26 (×2): 3 mL via RESPIRATORY_TRACT
  Filled 2024-10-26: qty 3

## 2024-10-26 MED ORDER — AZITHROMYCIN 250 MG PO TABS
500.0000 mg | ORAL_TABLET | Freq: Once | ORAL | Status: AC
  Administered 2024-10-26: 500 mg via ORAL
  Filled 2024-10-26: qty 2

## 2024-10-26 MED ORDER — ONDANSETRON HCL 4 MG/2ML IJ SOLN: 4.0000 mg | Freq: Four times a day (QID) | INTRAMUSCULAR | Status: DC | PRN

## 2024-10-26 NOTE — Telephone Encounter (Signed)
 Called [facility] to request refills on Attruby .  This med will have to be ordered by the pharmacy and will be mailed out tomorrow and pt should receive on Monday.  Will advise pt of same.    Mary Sharps, EMT-Paramedic [phone] 10/26/2024

## 2024-10-26 NOTE — H&P (Addendum)
 " [facility] History and Physical  Marcus Tran FMW:[id number] DOB: 05-21-1951 DOA: 10/26/2024   PCP: [name], NP  Specialists: Dr. Rolan is his cardiologist, with a heart failure clinic  Chief Complaint: Shortness of breath  HPI: Marcus Tran is a 74 y.o. male with a past medical history of COPD, current tobacco abuse, chronic systolic CHF who was in his usual state of health till a few days ago when he started developing shortness of breath.  He mentions that he has grandchildren who live with him and they have been sick with respiratory illness recently.  He denies any fever or chills.  Has had a cough with yellowish expectoration.  No nausea vomiting.  He has been wheezing as well.  Today his oxygen saturations at home were 88%.  He does not use oxygen at home.  He was brought into the emergency department by EMS.  He was given nebulizer treatment and Lasix  following which he started feeling better.  Currently he denies any chest pain.  He denies any dizziness or lightheadedness.  He mentions that he has been compliant with his medications.  However he also mentioned that he continues to smoke cigarettes.  Denies any alcohol use.  Denies any worsening in his leg swelling recently.  In the emergency department he was initially noted to have oxygen saturation of 80% on room air.  His other vital signs were stable except for hypertension.  Blood work showed elevated creatinine though he does have CKD.  Abnormal LFTs were noted.  Elevated BNP was noted.  Mildly elevated troponin level noted.  Chest x-ray was abnormal suggesting pulmonary edema versus infiltrates.  Home Medications: This list is not reconciled yet. Prior to Admission medications  Medication Sig Start Date End Date Taking? Authorizing Provider  Acoramidis , 712MG  Twice Daily, (ATTRUBY ) 356 MG TBPK Take 712 mg by mouth 2 (two) times daily. Patient not taking: Reported on 10/20/2024 08/23/24   Rolan Ezra RAMAN, MD  albuterol   (VENTOLIN  HFA) [phone] Base) MCG/ACT inhaler Inhale 2 puffs into the lungs every 4 (four) hours as needed for wheezing or shortness of breath. 01/16/23   Gonfa, Taye T, MD  aspirin  EC 81 MG tablet Take 1 tablet (81 mg total) by mouth daily. Swallow whole. 06/14/24 06/14/25  Glena Harlene HERO, FNP  atorvastatin  (LIPITOR ) 80 MG tablet TAKE 1 Tablet BY MOUTH ONCE DAILY EVERY EVENING 03/25/23   Waddell Danelle ORN, MD  bisoprolol  (ZEBETA ) 5 MG tablet Take 1 Tablet by mouth once daily (morning) 04/19/24   Rolan Ezra RAMAN, MD  dapagliflozin  propanediol (FARXIGA ) 10 MG TABS tablet TAKE 1 Tablet BY MOUTH ONCE DAILY (MORNING) 09/11/24   Milford, Harlene HERO, FNP  furosemide  (LASIX ) 20 MG tablet Take 1 tablet (20 mg total) by mouth daily. 06/15/24   Glena Harlene HERO, FNP  ipratropium-albuterol  (DUONEB) 0.5-2.5 (3) MG/3ML SOLN Take 3 mLs by nebulization as needed.    [provider]  isosorbide -hydrALAZINE  (BIDIL ) 20-37.5 MG tablet Take 0.5 tablets by mouth 3 (three) times daily. 08/16/24   Marcine Catalan M, PA-C  metFORMIN  (GLUCOPHAGE ) 500 MG tablet Take 1 tablet (500 mg total) by mouth 2 (two) times daily with a meal. 05/07/22   Dennise Lavada POUR, MD  montelukast  (SINGULAIR ) 10 MG tablet Take 1 tablet (10 mg total) by mouth at bedtime. Patient not taking: Reported on 10/20/2024 05/17/24   Hunsucker, Donnice SAUNDERS, MD  spironolactone  (ALDACTONE ) 25 MG tablet Take 0.5 tablets (12.5 mg total) by mouth daily. Patient  not taking: Reported on 10/22/2024 09/13/24   Lee, Jordan, NP  TRELEGY ELLIPTA 100-62.5-25 MCG/ACT AEPB Take 1 puff by mouth daily. 02/23/23   [provider]    Allergies: Allergies[1]  Past Medical History: Past Medical History:  Diagnosis Date   Acute metabolic encephalopathy 03/08/2022   Acute respiratory failure with hypoxia (HCC) 03/20/2022   Acute respiratory failure with hypoxia and hypercapnia (HCC) 03/06/2022   CHF (congestive heart failure) (HCC)    Dyslipidemia    Hypertension      Past Surgical History:  Procedure Laterality Date   LEG SURGERY     Right-sided as result of trauma   RIGHT/LEFT HEART CATH AND CORONARY ANGIOGRAPHY N/A 03/09/2022   Procedure: RIGHT/LEFT HEART CATH AND CORONARY ANGIOGRAPHY;  Surgeon: Court Dorn PARAS, MD;  Location: [facility];  Service: Cardiovascular;  Laterality: N/A;   Umbilicus surgery     As a child    Social History: Lives in his home.  Usually ambulatory at baseline.  Continues to smoke cigarettes less than 1 pack of cigarettes per day.  Denies any alcohol use.  No recreational drug use.  Family History:  Family History  Problem Relation Age of Onset   Hypertension Mother      Review of Systems - History obtained from the patient General ROS: positive for  - fatigue Psychological ROS: negative Ophthalmic ROS: negative ENT ROS: negative Allergy and Immunology ROS: negative Hematological and Lymphatic ROS: negative Endocrine ROS: negative Respiratory ROS: As in HPI Cardiovascular ROS: As in HPI Gastrointestinal ROS: no abdominal pain, change in bowel habits, or black or bloody stools Genito-Urinary ROS: no dysuria, trouble voiding, or hematuria Musculoskeletal ROS: negative Neurological ROS: no TIA or stroke symptoms Dermatological ROS: negative  Physical Examination  Vitals:   10/26/24 1407 10/26/24 1530 10/26/24 1753  BP: (!) 160/105 (!) 180/110   Pulse: 85 76   Resp: 20 20   Temp: 98.3 F (36.8 C) 97.7 F (36.5 C) 97.8 F (36.6 C)  TempSrc:  Oral Oral  SpO2: (!) 80% 100%     BP (!) 180/110 (BP Location: Right Arm)   Pulse 76   Temp 97.8 F (36.6 C) (Oral)   Resp 20   SpO2 100%   General appearance: alert, cooperative, appears stated age, and no distress Head: Normocephalic, without obvious abnormality, atraumatic Eyes: conjunctivae/corneas clear. PERRL, EOM's intact.  Throat: lips, mucosa, and tongue normal; teeth and gums normal Neck: no adenopathy, no carotid bruit, no JVD, supple,  symmetrical, trachea midline, and thyroid not enlarged, symmetric, no tenderness/mass/nodules Back: symmetric, no curvature. ROM normal. No CVA tenderness. Resp: Mildly tachypneic, but no use of accessory muscles.  Crackles bilateral bases.  Scattered wheezing.  No rhonchi. Cardio: regular rate and rhythm, S1, S2 normal, no murmur, click, rub or gallop GI: soft, non-tender; bowel sounds normal; no masses,  no organomegaly Extremities: extremities normal, atraumatic, no cyanosis or edema Pulses: 2+ and symmetric Skin: Skin color, texture, turgor normal. No rashes or lesions Lymph nodes: Cervical, supraclavicular, and axillary nodes normal. Neurologic: Alert and oriented x 3.  Cranial nerves II to XII intact.  Motor strength equal bilateral upper and lower extremities   Labs on Admission: I have personally reviewed following labs and imaging studies  CBC: Recent Labs  Lab 10/26/24 1556  WBC 4.5  NEUTROABS 3.0  HGB 12.6*  HCT 38.0*  MCV 91.1  PLT 210   Basic Metabolic Panel: Recent Labs  Lab 10/26/24 1556  NA 136  K  3.7  CL 99  CO2 26  GLUCOSE 113*  BUN 32*  CREATININE 1.93*  CALCIUM  9.0   GFR: CrCl cannot be calculated (Unknown ideal weight.). Liver Function Tests: Recent Labs  Lab 10/26/24 1556  AST 70*  ALT 67*  ALKPHOS 141*  BILITOT 1.4*  PROT 7.2  ALBUMIN 4.0    BNP (last 3 results) Recent Labs    10/26/24 1556  PROBNP 6,268.0*     Radiological Exams on Admission: DG Chest Portable 1 View Result Date: 10/26/2024 CLINICAL DATA:  Difficulty breathing. EXAM: PORTABLE CHEST 1 VIEW COMPARISON:  September 14, 2023 FINDINGS: The heart size and mediastinal contours are within normal limits. Very mildly increased interstitial lung markings are seen mild areas of atelectasis and/or infiltrate are noted within the bilateral lung bases. No pleural effusion or pneumothorax is identified. The visualized skeletal structures are unremarkable. IMPRESSION: 1. Mild  bibasilar atelectasis and/or infiltrate. 2. Mildly increased interstitial lung markings which may be, in part, chronic in nature. Mild interstitial edema cannot be excluded. Electronically Signed   By: Suzen Dials M.D.   On: 10/26/2024 15:10    My interpretation of Electrocardiogram: Sinus rhythm in the 70s.  Normal axis.  Nonspecific ST and T wave changes noted.  Similar to previous EKG.  No definite Q waves.  QT prolongation noted.   Problem List  Principal Problem:   Acute systolic CHF (congestive heart failure) (HCC) Active Problems:   Tobacco use disorder   Chronic HFrEF (heart failure with reduced ejection fraction) (HCC)   HTN (hypertension)   Stage 3a chronic kidney disease (CKD) (HCC)   DM2 (diabetes mellitus, type 2) (HCC)   Acute respiratory failure with hypoxia (HCC)   Assessment: This is a 74 year old African-American male with past medical history as stated earlier who comes in with a few day history of shortness of breath.  Differential diagnosis is broad.  He has grandchildren who were sick recently though he is afebrile.  He has active tobacco use.  He does have a history of systolic CHF.  Chest x-ray raise concern for volume overload.  He was noted to have elevated proBNP.  Likely has a combination of CHF exacerbation and COPD exacerbation.  Plan:  Acute respiratory failure with hypoxia This is new.  He usually does not use oxygen at home.  Likely multifactorial with COPD exacerbation and CHF exacerbation.  Low suspicion for venous thromboembolism.  Try to wean down oxygen to maintain saturation greater than 90%.  COPD with acute exacerbation Given steroids nebulizer treatments.  Doxycycline .  Imaging studies not conclusive for pneumonia.  His WBC is normal.  He is afebrile.  His grandchildren were sick recently so we will proceed with checking him for influenza COVID-19 and RSV.  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  At that time moderate concentric LVH was noted.  Grade 1 diastolic dysfunction was noted.  RV function was mildly reduced.  No significant valvular abnormalities noted.   We will update his echocardiogram.  No ACE inhibitor or ARB due to elevated creatinine.  Will give him IV Lasix .  Hold his beta-blocker for now.  Further management based on clinical response and findings on echocardiogram.  Dr. Rolan is his cardiologist. Strict ins and outs and daily weights.  Essential hypertension Blood pressure is extremely elevated.  Hydralazine  as needed for now.  Await medication reconciliation.  Chronic kidney disease stage IV Based on previous labs his creatinine baseline seems  to be between 1.8-2.8.  Avoid nephrotoxic agents.  Monitor renal function and electrolytes closely while he is getting diuresed.  Unclear if he is followed by nephrology.  Coronary artery disease Nonobstructive based on cardiac catheterization in 2023.  Continue aspirin .  Mildly abnormal LFTs Etiology unclear.  Recheck labs in the morning.  DVT Prophylaxis: Subcutaneous heparin  Code Status: Full code Family Communication: Discussed with patient Disposition: Hopefully return home when improved Consults called: None Admission Status: Status is: Observation The patient remains OBS appropriate and will d/c before 2 midnights.    Severity of Illness: The appropriate patient status for this patient is OBSERVATION. Observation status is judged to be reasonable and necessary in order to provide the required intensity of service to ensure the patient's safety. The patient's presenting symptoms, physical exam findings, and initial radiographic and laboratory data in the context of their medical condition is felt to place them at decreased risk for further clinical deterioration. Furthermore, it is anticipated that the patient will be medically stable for discharge from the hospital within 2 midnights of admission.     Further management decisions will depend on results of further testing and patient's response to treatment.   Oisin Yoakum  [facility] Pager on [email or web address]  10/26/2024, 6:33 PM     [1]  Allergies Allergen Reactions   Vyndamax  [Tafamidis ] Dermatitis    Allergic dermatitis/rash   "

## 2024-10-26 NOTE — ED Triage Notes (Signed)
 Pt BIB from [facility] was having a home visit and had a c/o of having a hard time breathing. Spo2 was 88% on RA, 156/100 BP, Hx of COPD and CHF, 10 albuterol  , 0.5  atrovent . Pt was 96% on RA after treatments, pt takes lasix . Denies chest pain, nausea, vomiting.

## 2024-10-26 NOTE — ED Provider Notes (Addendum)
 " [place] EMERGENCY DEPARTMENT AT [facility] Provider Note   CSN: [id number] Arrival date & time: 10/26/24  1318     History  Chief Complaint  Patient presents with   Shortness of Breath    Marcus Tran is a 74 y.o. male with PMH as listed below who presents BIB [facility] was having a home visit and had a c/o of having a hard time breathing. Started over the last couple of days. Endorses mild cough productive of sputum. Used his inhalers/nebulizers more frequently last night. No flu-like sxs or f/c. Spo2 was 88% on RA, 156/100 BP, Hx of COPD and HFrEF (EF 07/2023 40%), 10 albuterol  ?, 0.5  ?atrovent  given by EMS. Pt was 96% on RA after treatments, states the EMS treatments improved his sxs. He takes lasix . Denies chest pain, nausea, vomiting, leg swelling.    Past Medical History:  Diagnosis Date   Acute metabolic encephalopathy 03/08/2022   Acute respiratory failure with hypoxia (HCC) 03/20/2022   Acute respiratory failure with hypoxia and hypercapnia (HCC) 03/06/2022   CHF (congestive heart failure) (HCC)    Dyslipidemia    Hypertension        Home Medications Prior to Admission medications  Medication Sig Start Date End Date Taking? Authorizing Provider  Acoramidis , 712MG  Twice Daily, (ATTRUBY ) 356 MG TBPK Take 712 mg by mouth 2 (two) times daily. Patient not taking: Reported on 10/20/2024 08/23/24   Rolan Ezra RAMAN, MD  albuterol  (VENTOLIN  HFA) 108 [phone] Base) MCG/ACT inhaler Inhale 2 puffs into the lungs every 4 (four) hours as needed for wheezing or shortness of breath. 01/16/23   Gonfa, Taye T, MD  aspirin  EC 81 MG tablet Take 1 tablet (81 mg total) by mouth daily. Swallow whole. 06/14/24 06/14/25  Glena Harlene HERO, FNP  atorvastatin  (LIPITOR ) 80 MG tablet TAKE 1 Tablet BY MOUTH ONCE DAILY EVERY EVENING 03/25/23   Waddell Danelle ORN, MD  bisoprolol  (ZEBETA ) 5 MG tablet Take 1 Tablet by mouth once daily (morning) 04/19/24   Rolan Ezra RAMAN, MD  dapagliflozin  propanediol  (FARXIGA ) 10 MG TABS tablet TAKE 1 Tablet BY MOUTH ONCE DAILY (MORNING) 09/11/24   [place], Harlene HERO, FNP  furosemide  (LASIX ) 20 MG tablet Take 1 tablet (20 mg total) by mouth daily. 06/15/24   [place], Harlene HERO, FNP  ipratropium-albuterol  (DUONEB) 0.5-2.5 (3) MG/3ML SOLN Take 3 mLs by nebulization as needed.    [provider]  isosorbide -hydrALAZINE  (BIDIL ) 20-37.5 MG tablet Take 0.5 tablets by mouth 3 (three) times daily. 08/16/24   Marcine Catalan M, PA-C  metFORMIN  (GLUCOPHAGE ) 500 MG tablet Take 1 tablet (500 mg total) by mouth 2 (two) times daily with a meal. 05/07/22   Dennise Lavada POUR, MD  montelukast  (SINGULAIR ) 10 MG tablet Take 1 tablet (10 mg total) by mouth at bedtime. Patient not taking: Reported on 10/20/2024 05/17/24   Hunsucker, Donnice SAUNDERS, MD  spironolactone  (ALDACTONE ) 25 MG tablet Take 0.5 tablets (12.5 mg total) by mouth daily. Patient not taking: Reported on 10/22/2024 09/13/24   Lee, Jordan, NP  TRELEGY ELLIPTA 100-62.5-25 MCG/ACT AEPB Take 1 puff by mouth daily. 02/23/23   [provider]      Allergies    Vyndamax  [tafamidis ]    Review of Systems   Review of Systems A 10 point review of systems was performed and is negative unless otherwise reported in HPI.  Physical Exam Updated Vital Signs BP (!) 180/110 (BP Location: Right Arm)   Pulse 76   Temp  97.7 F (36.5 C) (Oral)   Resp 20   SpO2 100%  Physical Exam General: Normal appearing male, lying in bed.  HEENT: PERRLA, Sclera anicteric, MMM, trachea midline.  Cardiology: RRR, no murmurs/rubs/gallops.  Resp: Tachypnea, mild increased WOB. Tight lung sounds but no wheezing. Abd: Soft, non-tender, non-distended. No rebound tenderness or guarding.  GU: Deferred. MSK: No peripheral edema or signs of trauma.  Skin: warm, dry.  Neuro: A&Ox4, CNs II-XII grossly intact. MAEs. Sensation grossly intact.  Psych: Normal mood and affect.   ED Results / Procedures / Treatments   Labs (all labs ordered  are listed, but only abnormal results are displayed) Labs Reviewed  CBC WITH DIFFERENTIAL/PLATELET - Abnormal; Notable for the following components:      Result Value   RBC 4.17 (*)    Hemoglobin 12.6 (*)    HCT 38.0 (*)    All other components within normal limits  COMPREHENSIVE METABOLIC PANEL WITH GFR - Abnormal; Notable for the following components:   Glucose, Bld 113 (*)    BUN 32 (*)    Creatinine, Ser 1.93 (*)    AST 70 (*)    ALT 67 (*)    Alkaline Phosphatase 141 (*)    Total Bilirubin 1.4 (*)    GFR, Estimated 36 (*)    All other components within normal limits  BLOOD GAS, VENOUS - Abnormal; Notable for the following components:   pO2, Ven 75 (*)    Bicarbonate 29.2 (*)    Acid-Base Excess 4.0 (*)    All other components within normal limits  PRO BRAIN NATRIURETIC PEPTIDE - Abnormal; Notable for the following components:   Pro Brain Natriuretic Peptide 6,268.0 (*)    All other components within normal limits  TROPONIN T, HIGH SENSITIVITY - Abnormal; Notable for the following components:   Troponin T High Sensitivity 49 (*)    All other components within normal limits  TROPONIN T, HIGH SENSITIVITY    EKG EKG Interpretation Date/Time:  Thursday October 26 2024 15:30:26 EST Ventricular Rate:  76 PR Interval:  162 QRS Duration:  136 QT Interval:  470 QTC Calculation: 529 R Axis:   -45  Text Interpretation: Sinus rhythm Probable left atrial enlargement RBBB and LAFB Abnrm T, consider ischemia, anterolateral lds Similar to prior EKGs Confirmed by Franklyn Gills [phone]) on 10/26/2024 4:02:15 PM  Radiology DG Chest Portable 1 View Result Date: 10/26/2024 CLINICAL DATA:  Difficulty breathing. EXAM: PORTABLE CHEST 1 VIEW COMPARISON:  September 14, 2023 FINDINGS: The heart size and mediastinal contours are within normal limits. Very mildly increased interstitial lung markings are seen mild areas of atelectasis and/or infiltrate are noted within the bilateral lung bases. No  pleural effusion or pneumothorax is identified. The visualized skeletal structures are unremarkable. IMPRESSION: 1. Mild bibasilar atelectasis and/or infiltrate. 2. Mildly increased interstitial lung markings which may be, in part, chronic in nature. Mild interstitial edema cannot be excluded. Electronically Signed   By: Suzen Dials M.D.   On: 10/26/2024 15:10    Procedures Procedures    Medications Ordered in ED Medications  methylPREDNISolone  sodium succinate (SOLU-MEDROL ) 125 mg/2 mL injection 125 mg (has no administration in time range)  furosemide  (LASIX ) injection 40 mg (has no administration in time range)  cefTRIAXone  (ROCEPHIN ) 1 g in sodium chloride  0.9 % 100 mL IVPB (has no administration in time range)  azithromycin  (ZITHROMAX ) tablet 500 mg (has no administration in time range)  ipratropium-albuterol  (DUONEB) 0.5-2.5 (3) MG/3ML nebulizer solution 3 mL (3 mLs  Nebulization Given 10/26/24 1557)    ED Course/ Medical Decision Making/ A&P                          Medical Decision Making Amount and/or Complexity of Data Reviewed Labs: ordered. Decision-making details documented in ED Course. Radiology: ordered. Decision-making details documented in ED Course.  Risk Prescription drug management. Decision regarding hospitalization.    This patient presents to the ED for concern of COPD exacerbation, resp distress, hypoxia, this involves an extensive number of treatment options, and is a complaint that carries with it a high risk of complications and morbidity.  I considered the following differential and admission for this acute, potentially life threatening condition.   MDM:    DDX for dyspnea includes but is not limited to:  Consider COPD vs CHF exacerbation versus pneumonia. He reports increased wheezing and cough at home.  He states that he has had increased wheezing at home and had some improvement with albuterol  and Atrovent  provided by EMS.  He was given another  DuoNeb and Solu-Medrol  IV here with improvement, had been satting in the low 80s requiring nonrebreather and now is 100% on 4 L nasal cannula.  VBG does not demonstrate any hypercarbic respiratory failure.  He does have some interstitial lung markings that could be interstitial edema on chest x-ray and an elevated proBNP greater than 6000 with a history of HFrEF.  No significant lower extremity edema but consider CHF exacerbation is given IV Lasix  40 mg.  He does have mild transaminitis but denies any abdominal pain nausea or vomiting, this could be potentially related to heart failure or another cause, will add a Tylenol  level.  He also has findings on chest x-ray that could be infiltrate, given ceftriaxone  and doxycycline .  Reassuringly he has no fever or leukocytosis.  EKG with chronic changes, initial troponin is mildly elevated will obtain repeat.  Doubt PE in clinical context.  Will admit to medicine.   Clinical Course as of 10/26/24 1634  Thu Oct 26, 2024  1519 DG Chest Portable 1 View 1. Mild bibasilar atelectasis and/or infiltrate. 2. Mildly increased interstitial lung markings which may be, in part, chronic in nature. Mild interstitial edema cannot be excluded.   [HN]  1601 SpO2: 100 % 100% now on 4L [place] [HN]  1602 WBC: 4.5 No leukocytosis  [HN]  1602 Hemoglobin(!): 12.6 Similar to prior [HN]  1633 AST(!): 70 [HN]  1633 ALT(!): 67 [HN]  1633 Alkaline Phosphatase(!): 141 [HN]  1633 Total Bilirubin(!): 1.4 New transaminitis - complains of no abdominal pain, nausea, or vomiting. [HN]  1633 Pro Brain Natriuretic Peptide(!): 6,268.0 Elevated pro-BNP. Already ordered lasix  IV.  [HN]    Clinical Course User Index [HN] Franklyn Sid SAILOR, MD    Labs: I Ordered, and personally interpreted labs.  The pertinent results include:  those listed above  Imaging Studies ordered: I ordered imaging studies including CXR I independently visualized and interpreted imaging. I agree with the  radiologist interpretation  Additional history obtained from chart review, EMS.    Cardiac Monitoring: The patient was maintained on a cardiac monitor.  I personally viewed and interpreted the cardiac monitored which showed an underlying rhythm of: NSR  Reevaluation: After the interventions noted above, I reevaluated the patient and found that they have :improved  Social Determinants of Health: Lives independently  Disposition:  Admit to hospitalist  Co morbidities that complicate the patient evaluation  Past Medical History:  Diagnosis  Date   Acute metabolic encephalopathy 03/08/2022   Acute respiratory failure with hypoxia (HCC) 03/20/2022   Acute respiratory failure with hypoxia and hypercapnia (HCC) 03/06/2022   CHF (congestive heart failure) (HCC)    Dyslipidemia    Hypertension      Medicines Meds ordered this encounter  Medications   ipratropium-albuterol  (DUONEB) 0.5-2.5 (3) MG/3ML nebulizer solution 3 mL   methylPREDNISolone  sodium succinate (SOLU-MEDROL ) 125 mg/2 mL injection 125 mg    IV methylprednisolone  will be converted to either a q12h or q24h frequency with the same total daily dose (TDD).  Ordered Dose: 1 to 125 mg TDD; convert to: TDD q24h.  Ordered Dose: 126 to 250 mg TDD; convert to: TDD div q12h.  Ordered Dose: >250 mg TDD; DAW.   furosemide  (LASIX ) injection 40 mg   cefTRIAXone  (ROCEPHIN ) 1 g in sodium chloride  0.9 % 100 mL IVPB    Antibiotic Indication::   CAP   DISCONTD: azithromycin  (ZITHROMAX ) 500 mg in sodium chloride  0.9 % 250 mL IVPB   azithromycin  (ZITHROMAX ) tablet 500 mg    I have reviewed the patients home medicines and have made adjustments as needed  Problem List / ED Course: Problem List Items Addressed This Visit   None Visit Diagnoses       COPD exacerbation (HCC)    -  Primary   Relevant Medications   ipratropium-albuterol  (DUONEB) 0.5-2.5 (3) MG/3ML nebulizer solution 3 mL (Completed)   methylPREDNISolone  sodium succinate  (SOLU-MEDROL ) 125 mg/2 mL injection 125 mg   azithromycin  (ZITHROMAX ) tablet 500 mg     Acute hypoxic respiratory failure (HCC)         Acute on chronic systolic congestive heart failure (HCC)       Relevant Medications   furosemide  (LASIX ) injection 40 mg            Niclas Markell N, MD 10/26/24 1638  "

## 2024-10-26 NOTE — Progress Notes (Signed)
 Specialty Pharmacy Refill Coordination Note  Marcus Tran is a 74 y.o. male contacted today regarding refills of specialty medication(s) Acoramidis  HCl (Attruby )   Patient requested Delivery   Delivery date: 10/30/24   Verified address: [address]  [place] [address]   Medication will be filled on: 10/27/24

## 2024-10-27 ENCOUNTER — Other Ambulatory Visit: Payer: Self-pay

## 2024-10-27 ENCOUNTER — Encounter (HOSPITAL_COMMUNITY): Payer: Self-pay | Admitting: Internal Medicine

## 2024-10-27 ENCOUNTER — Observation Stay (HOSPITAL_COMMUNITY)

## 2024-10-27 DIAGNOSIS — G4733 Obstructive sleep apnea (adult) (pediatric): Secondary | ICD-10-CM | POA: Diagnosis present

## 2024-10-27 DIAGNOSIS — J441 Chronic obstructive pulmonary disease with (acute) exacerbation: Secondary | ICD-10-CM | POA: Diagnosis present

## 2024-10-27 DIAGNOSIS — I120 Hypertensive chronic kidney disease with stage 5 chronic kidney disease or end stage renal disease: Secondary | ICD-10-CM | POA: Diagnosis present

## 2024-10-27 DIAGNOSIS — I472 Ventricular tachycardia, unspecified: Secondary | ICD-10-CM | POA: Diagnosis present

## 2024-10-27 DIAGNOSIS — I2510 Atherosclerotic heart disease of native coronary artery without angina pectoris: Secondary | ICD-10-CM | POA: Diagnosis present

## 2024-10-27 DIAGNOSIS — E785 Hyperlipidemia, unspecified: Secondary | ICD-10-CM | POA: Diagnosis present

## 2024-10-27 DIAGNOSIS — N185 Chronic kidney disease, stage 5: Secondary | ICD-10-CM | POA: Diagnosis present

## 2024-10-27 DIAGNOSIS — Z8249 Family history of ischemic heart disease and other diseases of the circulatory system: Secondary | ICD-10-CM | POA: Diagnosis not present

## 2024-10-27 DIAGNOSIS — I5021 Acute systolic (congestive) heart failure: Secondary | ICD-10-CM | POA: Diagnosis not present

## 2024-10-27 DIAGNOSIS — Z5982 Transportation insecurity: Secondary | ICD-10-CM | POA: Diagnosis not present

## 2024-10-27 DIAGNOSIS — E854 Organ-limited amyloidosis: Secondary | ICD-10-CM | POA: Diagnosis present

## 2024-10-27 DIAGNOSIS — Z1152 Encounter for screening for COVID-19: Secondary | ICD-10-CM | POA: Diagnosis not present

## 2024-10-27 DIAGNOSIS — F17210 Nicotine dependence, cigarettes, uncomplicated: Secondary | ICD-10-CM | POA: Diagnosis present

## 2024-10-27 DIAGNOSIS — J9811 Atelectasis: Secondary | ICD-10-CM | POA: Diagnosis present

## 2024-10-27 DIAGNOSIS — I5023 Acute on chronic systolic (congestive) heart failure: Secondary | ICD-10-CM

## 2024-10-27 DIAGNOSIS — I442 Atrioventricular block, complete: Secondary | ICD-10-CM | POA: Diagnosis present

## 2024-10-27 DIAGNOSIS — I2489 Other forms of acute ischemic heart disease: Secondary | ICD-10-CM | POA: Diagnosis present

## 2024-10-27 DIAGNOSIS — E1122 Type 2 diabetes mellitus with diabetic chronic kidney disease: Secondary | ICD-10-CM | POA: Diagnosis present

## 2024-10-27 DIAGNOSIS — I43 Cardiomyopathy in diseases classified elsewhere: Secondary | ICD-10-CM | POA: Diagnosis present

## 2024-10-27 DIAGNOSIS — I452 Bifascicular block: Secondary | ICD-10-CM | POA: Diagnosis present

## 2024-10-27 DIAGNOSIS — Z7984 Long term (current) use of oral hypoglycemic drugs: Secondary | ICD-10-CM | POA: Diagnosis not present

## 2024-10-27 DIAGNOSIS — I428 Other cardiomyopathies: Secondary | ICD-10-CM | POA: Diagnosis present

## 2024-10-27 DIAGNOSIS — Z7982 Long term (current) use of aspirin: Secondary | ICD-10-CM | POA: Diagnosis not present

## 2024-10-27 DIAGNOSIS — Z79899 Other long term (current) drug therapy: Secondary | ICD-10-CM | POA: Diagnosis not present

## 2024-10-27 DIAGNOSIS — J9601 Acute respiratory failure with hypoxia: Secondary | ICD-10-CM | POA: Diagnosis present

## 2024-10-27 LAB — BASIC METABOLIC PANEL WITH GFR
Anion gap: 15 (ref 5–15)
BUN: 27 mg/dL — ABNORMAL HIGH (ref 8–23)
CO2: 25 mmol/L (ref 22–32)
Calcium: 9.2 mg/dL (ref 8.9–10.3)
Chloride: 98 mmol/L (ref 98–111)
Creatinine, Ser: 1.7 mg/dL — ABNORMAL HIGH (ref 0.61–1.24)
GFR, Estimated: 42 mL/min — ABNORMAL LOW
Glucose, Bld: 156 mg/dL — ABNORMAL HIGH (ref 70–99)
Potassium: 3.8 mmol/L (ref 3.5–5.1)
Sodium: 138 mmol/L (ref 135–145)

## 2024-10-27 LAB — ECHOCARDIOGRAM COMPLETE
Area-P 1/2: 3.49 cm2
Calc EF: 64.1 %
Height: 65 in
S' Lateral: 4.5 cm
Single Plane A2C EF: 60.1 %
Single Plane A4C EF: 68 %
Weight: 2211.2 [oz_av]

## 2024-10-27 LAB — RESPIRATORY PANEL BY PCR

## 2024-10-27 LAB — HEMOGLOBIN A1C
Hgb A1c MFr Bld: 5.8 % — ABNORMAL HIGH (ref 4.8–5.6)
Mean Plasma Glucose: 119.76 mg/dL

## 2024-10-27 LAB — HEPATIC FUNCTION PANEL
ALT: 66 U/L — ABNORMAL HIGH (ref 0–44)
AST: 56 U/L — ABNORMAL HIGH (ref 15–41)
Albumin: 3.9 g/dL (ref 3.5–5.0)
Alkaline Phosphatase: 118 U/L (ref 38–126)
Bilirubin, Direct: 0.5 mg/dL — ABNORMAL HIGH (ref 0.0–0.2)
Indirect Bilirubin: 0.6 mg/dL (ref 0.3–0.9)
Total Bilirubin: 1.1 mg/dL (ref 0.0–1.2)
Total Protein: 7 g/dL (ref 6.5–8.1)

## 2024-10-27 LAB — GLUCOSE, CAPILLARY
Glucose-Capillary: 134 mg/dL — ABNORMAL HIGH (ref 70–99)
Glucose-Capillary: 209 mg/dL — ABNORMAL HIGH (ref 70–99)
Glucose-Capillary: 186 mg/dL — ABNORMAL HIGH (ref 70–99)

## 2024-10-27 LAB — MAGNESIUM: Magnesium: 2 mg/dL (ref 1.7–2.4)

## 2024-10-27 LAB — PRO BRAIN NATRIURETIC PEPTIDE: Pro Brain Natriuretic Peptide: 3938 pg/mL — ABNORMAL HIGH

## 2024-10-27 MED ORDER — ATORVASTATIN CALCIUM 40 MG PO TABS
80.0000 mg | ORAL_TABLET | Freq: Every day | ORAL | Status: DC
  Administered 2024-10-27 – 2024-10-28 (×2): 80 mg via ORAL
  Filled 2024-10-27 (×2): qty 2

## 2024-10-27 MED ORDER — MONTELUKAST SODIUM 10 MG PO TABS
10.0000 mg | ORAL_TABLET | Freq: Every day | ORAL | Status: DC
  Administered 2024-10-27: 10 mg via ORAL
  Filled 2024-10-27: qty 1

## 2024-10-27 MED ORDER — BISOPROLOL FUMARATE 5 MG PO TABS
5.0000 mg | ORAL_TABLET | Freq: Every day | ORAL | Status: DC
  Administered 2024-10-27 – 2024-10-28 (×2): 5 mg via ORAL
  Filled 2024-10-27 (×2): qty 1

## 2024-10-27 MED ORDER — PERFLUTREN LIPID MICROSPHERE
1.0000 mL | INTRAVENOUS | Status: AC | PRN
  Administered 2024-10-27: 2 mL via INTRAVENOUS

## 2024-10-27 MED ORDER — NICOTINE 14 MG/24HR TD PT24
14.0000 mg | MEDICATED_PATCH | Freq: Every day | TRANSDERMAL | Status: DC
  Administered 2024-10-28: 14 mg via TRANSDERMAL
  Filled 2024-10-27: qty 1

## 2024-10-27 MED ORDER — IPRATROPIUM-ALBUTEROL 0.5-2.5 (3) MG/3ML IN SOLN: 3.0000 mL | Freq: Three times a day (TID) | RESPIRATORY_TRACT | Status: DC

## 2024-10-27 MED ORDER — ARFORMOTEROL TARTRATE 15 MCG/2ML IN NEBU
15.0000 ug | INHALATION_SOLUTION | Freq: Two times a day (BID) | RESPIRATORY_TRACT | Status: DC
  Administered 2024-10-27 – 2024-10-28 (×3): 15 ug via RESPIRATORY_TRACT
  Filled 2024-10-27 (×3): qty 2

## 2024-10-27 MED ORDER — INSULIN ASPART 100 UNIT/ML IJ SOLN
0.0000 [IU] | Freq: Three times a day (TID) | INTRAMUSCULAR | Status: DC
  Administered 2024-10-27: 3 [IU] via SUBCUTANEOUS
  Administered 2024-10-28: 2 [IU] via SUBCUTANEOUS
  Filled 2024-10-27: qty 2
  Filled 2024-10-27: qty 3

## 2024-10-27 MED ORDER — REVEFENACIN 175 MCG/3ML IN SOLN
175.0000 ug | Freq: Every day | RESPIRATORY_TRACT | Status: DC
  Administered 2024-10-27 – 2024-10-28 (×2): 175 ug via RESPIRATORY_TRACT
  Filled 2024-10-27 (×2): qty 3

## 2024-10-27 MED ORDER — MAGNESIUM SULFATE IN D5W 1-5 GM/100ML-% IV SOLN
1.0000 g | Freq: Once | INTRAVENOUS | Status: AC
  Administered 2024-10-27: 1 g via INTRAVENOUS
  Filled 2024-10-27: qty 100

## 2024-10-27 MED ORDER — ISOSORB DINITRATE-HYDRALAZINE 20-37.5 MG PO TABS
0.5000 | ORAL_TABLET | Freq: Three times a day (TID) | ORAL | Status: DC
  Administered 2024-10-27 – 2024-10-28 (×4): 0.5 via ORAL
  Filled 2024-10-27 (×5): qty 0.5

## 2024-10-27 MED ORDER — DAPAGLIFLOZIN PROPANEDIOL 10 MG PO TABS
10.0000 mg | ORAL_TABLET | Freq: Every day | ORAL | Status: DC
  Administered 2024-10-28: 10 mg via ORAL
  Filled 2024-10-27: qty 1

## 2024-10-27 MED ORDER — ACORAMIDIS (712MG TWICE DAILY) 356 MG PO TBPK: 712.0000 mg | ORAL_TABLET | Freq: Two times a day (BID) | ORAL | Status: DC

## 2024-10-27 NOTE — Plan of Care (Signed)

## 2024-10-27 NOTE — Hospital Course (Addendum)
 Marcus Tran is a 74 y.o. male with PMH of  of COPD, diabetes, hypertension current tobacco abuse, chronic systolic CHF, on Acoramidis  for cardiomyopathy, who was in his usual state of health till a few days ago when he started developing shortness of breath, in the setting of sick contacts with grand child with respiratory illness recently and having cough with yellowish expectoration and has been wheezing as well. His saturations at home were 88%. EMS-gave nebulizer treatment and Lasix  following which he started feeling better.He continues to smoke cigarettes. In the ED: Hypoxic-80% on room air vitals with hypertension-labs showed elevated creatinine consistent with CKD, mild transaminitis, abnormal LFTsElevated BNP,mldly elevated troponin level but falt CXR>>abnormal suggesting pulmonary edema versus infiltrates. Patient diuresed IV Lasix  with improvement in volume status-echo updated EF 40-45%, G1 DD with regional WMA mildly reduced RV SF size normal, overall similar ejection fraction in similar wall motion abnormalities Transitioned to oral diuretics and GDMT adjusted- Once able to wean off oxygen,plan to discharge home. patient was able to ambulate 573ft on room air, sat 93%.At rest he is 95%.   Subjective: Seen and examined today Overall feels much improved.  Weaning off oxygen Remains afebrile BP stable still on nasal cannula Labs reviewed hyponatremia creatinine slightly up 1.9  Discharge Diagnoses:   Acute hypoxic respiratory Acute COPD exacerbation Tobacco abuse: Sick contact with respiratory illness, and having cough shortness of breath wheezing, hypoxia chest x-ray shows edema versus infiltrate, bnp elevated, with history of cardiac, liver disease/cardiomyopathy/chronic CHF.  Low suspicion for VTE. Cont to wean o2 No leukocytosis fever or obvious evidence of pneumonia, influenza COVID, RVP panel negative Cont lasix , steroid bronchodilators as below. Cont smoking cessation   Acute  on chronic systolic CHF Cardiac amyloidosis/cardiomyopathy EF Elevated but flat troponin likely demand ischemia: Last EF is 40% in 2024 with moderate concentric LVH g1dd,RV function was mildly reduced.No significant valvular abnormalities noted.   Repeat echo no significant changes, cardiology adjusted GDMT along with oral diuretics at this time stable for discharge home Managed with IV Lasix  transitioned to p.o-continue Lasix  40 twice daily. Cont home BiDil , bisoprolol ,farxiga .  Cardiology consulted and at this time signed off.   Cont to monitor daily I/O,weight, electrolytes and net balance as below.Keep on  salt/fluid restricted diet and monitor in tele. Net IO Since Admission: -1,110 mL [10/28/24 1151]  Filed Weights   10/26/24 1900 10/27/24 0421 10/28/24 0452  Weight: 63.2 kg 62.7 kg 63.6 kg   Recent Labs  Lab 10/26/24 1556 10/27/24 0409 10/27/24 1950 10/28/24 0403  PROBNP 6,268.0* 3,938.0*  --   --   BUN 32* 27*  --  36*  CREATININE 1.93* 1.70*  --  1.99*  K 3.7 3.8  --  4.3  MG  --   --  2.0  --     Type 2 diabetes: Stable.  Hold metformin  cont sliding scale insulin .  Last A1c 6.2 in 2024.  Given elevated CKD hold off on resuming metformin  for now- fu w/w PCP, A1c is controlled at 5.8 will encourage diet control, continue Farxiga . Recent Labs  Lab 10/27/24 0956 10/27/24 1145 10/27/24 1553 10/27/24 2104 10/28/24 0736  GLUCAP  --  134* 209* 186* 171*  HGBA1C 5.8*  --   --   --   --     Essential hypertension: Poorly controlled on admission.  Currently stable on GDMT diuretics as above with-BiDil , bisoprolol    CKD 5: Baseline ~1.8-2.8. Avoid nephrotoxic agents.  Monitor renal function and electrolytes closely  Recent  Labs    01/03/24 1424 01/11/24 1154 06/14/24 1114 09/08/24 1123 09/22/24 1104 10/09/24 1003 10/26/24 1556 10/27/24 0409 10/28/24 0403  BUN 34* 23 37* 46* 27* 29* 32* 27* 36*  CREATININE 2.24* 1.85* 1.95* 2.88* 2.25* 2.07* 1.93* 1.70* 1.99*  CO2  20* 24 21* 21* 27 23 26 25 26   K 4.9 4.4 5.3* 4.2 5.2* 4.1 3.7 3.8 4.3    Coronary artery disease Hyperlipidemia Nonobstructive based on cardiac catheterization in 2023. Continue aspirin , resume statin.  Mildly abnormal LFTs Etiology unclear.  Slightly abnormal likely from CHF.   DVT prophylaxis: heparin  injection 5,000 Units Start: 10/26/24 2200 Code Status:   Code Status: Full Code Family Communication: plan of care discussed with patient at bedside. Patient status is: Remains hospitalized because of severity of illness Level of care: Telemetry   Dispo: The patient is from: HOME            Anticipated disposition: home today Objective: Vitals last 24 hrs: Vitals:   10/27/24 2100 10/28/24 0452 10/28/24 0954 10/28/24 0958  BP: (!) 141/95 125/87    Pulse: 84 67    Resp: (!) 21 20    Temp: 98.3 F (36.8 C) 97.8 F (36.6 C)    TempSrc: Oral Oral    SpO2: 100% 100% 94% 94%  Weight:  63.6 kg    Height:        Physical Examination: General exam: AAOX3 HEENT:Oral mucosa moist, Ear/Nose WNL grossly Respiratory system: Bilaterally clear breath sounds, occasional coughing and wheezing Cardiovascular system: S1 & S2 +, No JVD. Gastrointestinal system: Abdomen soft,NT,ND, BS+ Nervous System: Alert, awake, moving all extremities,and following commands. Extremities: extremities warm, leg edema neg Skin: Warm, no rashes MSK: Normal muscle bulk,tone, power   Medications reviewed:  Scheduled Meds:  Acoramidis  (712MG  Twice Daily)  712 mg Oral BID   arformoterol   15 mcg Nebulization BID   aspirin  EC  81 mg Oral Daily   atorvastatin   80 mg Oral Daily   bisoprolol   5 mg Oral Daily   budesonide  (PULMICORT ) nebulizer solution  0.25 mg Nebulization BID   dapagliflozin  propanediol  10 mg Oral Daily   doxycycline   100 mg Oral Q12H   furosemide   40 mg Oral BID   heparin   5,000 Units Subcutaneous Q8H   insulin  aspart  0-9 Units Subcutaneous TID WC   isosorbide -hydrALAZINE   0.5 tablet  Oral TID   montelukast   10 mg Oral QHS   nicotine   14 mg Transdermal Daily   predniSONE   40 mg Oral Q breakfast   revefenacin   175 mcg Nebulization Daily   sodium chloride  flush  3 mL Intravenous Q12H   Continuous Infusions:   Diet: Diet Order             Diet Heart Room service appropriate? Yes; Fluid consistency: Thin  Diet effective now

## 2024-10-27 NOTE — Progress Notes (Signed)
 " PROGRESS NOTE Marcus Tran  FMW:[id number] DOB: Jan 24, 1951 DOA: 10/26/2024 PCP: [name], NP  Brief Narrative/Hospital Course: Marcus Tran is a 74 y.o. male with PMH of  of COPD, diabetes, hypertension current tobacco abuse, chronic systolic CHF, on Acoramidis  for cardiomyopathy, who was in his usual state of health till a few days ago when he started developing shortness of breath, in the setting of sick contacts with grand child with respiratory illness recently and having cough with yellowish expectoration and has been wheezing as well. His saturations at home were 88%. EMS-gave nebulizer treatment and Lasix  following which he started feeling better.He continues to smoke cigarettes. In the ED: Hypoxic-80% on room air vitals with hypertension-labs showed elevated creatinine consistent with CKD, mild transaminitis, abnormal LFTsElevated BNP,mldly elevated troponin level but falt CXR>>abnormal suggesting pulmonary edema versus infiltrates.  Subjective: Seen and examined today Patient resting comfortably, he feels much improved today Daughter at the bedsi request nicotine  patch de. Overnight  afebrile, VSS-although hypertensive in 180s-110 on admission, on 6 L [place] labs-sodium improving creatinine 2>1.9>1.7, LFTs with mild transaminitis proBNP 6k> 3k   Assessment and plan:  Acute hypoxic respiratory: Sick contact with respiratory illness, and having cough shortness of breath wheezing, hypoxia chest x-ray shows edema versus infiltrate, bnp elevated, with history of cardiac, liver disease/cardiomyopathy/chronic CHF.  Low suspicion for VTE.  Continue supplemental oxygen to keep SpO2 above 92%, continue underlying etiology treatment as below.   Acute COPD with exacerbation Tobacco abuse: No leukocytosis fever or obvious evidence of pneumonia, influenza COVID-negative.  Check RVP panel, will continue on doxycycline , prednisone , bronchodilators -add yupleric,Brovana  Pulmicort .Cessation advised, added  nicotine  patch:  Acute on chronic systolic CHF Cardiac amyloidosis/cardiomyopathy EF Elevated but flat troponin likely demand ischemia: Last EF is 40% in 2024 with moderate concentric LVH g1dd,RV function was mildly reduced.No significant valvular abnormalities noted.   Recheck echocardiogram.  Continue IV Lasix  40 bid, resume home BiDil , bisoprolol , ?  If taking Aldactone  and farxiga   Cardiology has been consulted given his cardiomyopathy Dr. Rolan is his cardiologist. Cont to monitor daily I/O,weight, electrolytes and net balance as below.Keep on  salt/fluid restricted diet and monitor in tele. Net IO Since Admission: -2,110 mL [10/27/24 1109]  Filed Weights   10/26/24 1900 10/27/24 0421  Weight: 63.2 kg 62.7 kg   Recent Labs  Lab 10/26/24 1556 10/27/24 0409  PROBNP 6,268.0* 3,938.0*  BUN 32* 27*  CREATININE 1.93* 1.70*  K 3.7 3.8    Type 2 diabetes: Hold metformin  add sliding scale insulin .  Last A1c 6.2 in 2024 No results for input(s): GLUCAP, HGBA1C in the last 168 hours.    Essential hypertension: Poorly controlled on admission.  Continue diuretics, resume home meds-BiDil , bisoprolol    CKD 5: Based on previous labs his creatinine baseline seems to be between 1.8-2.8. Avoid nephrotoxic agents.  Monitor renal function and electrolytes closely  Recent Labs    01/03/24 1424 01/11/24 1154 06/14/24 1114 09/08/24 1123 09/22/24 1104 10/09/24 1003 10/26/24 1556 10/27/24 0409  BUN 34* 23 37* 46* 27* 29* 32* 27*  CREATININE 2.24* 1.85* 1.95* 2.88* 2.25* 2.07* 1.93* 1.70*  CO2 20* 24 21* 21* 27 23 26 25   K 4.9 4.4 5.3* 4.2 5.2* 4.1 3.7 3.8    Coronary artery disease Hyperlipidemia Nonobstructive based on cardiac catheterization in 2023.  Continue aspirin , resume statin.  Mildly abnormal LFTs Etiology unclear.  Slightly abnormal likely from CHF.   DVT prophylaxis: heparin  injection 5,000 Units Start: 10/26/24 2200 Code Status:  Code Status: Full Code Family  Communication: plan of care discussed with patient at bedside. Patient status is: Remains hospitalized because of severity of illness Level of care: Telemetry   Dispo: The patient is from: HOME            Anticipated disposition: TBD Objective: Vitals last 24 hrs: Vitals:   10/27/24 0421 10/27/24 0630 10/27/24 0739 10/27/24 0742  BP:  (!) 145/99    Pulse:  77    Resp:  20    Temp:  98.3 F (36.8 C)    TempSrc:  Oral    SpO2:  98% 95% 95%  Weight: 62.7 kg     Height:        Physical Examination: General exam: alert awake, oriented, older than stated age HEENT:Oral mucosa moist, Ear/Nose WNL grossly Respiratory system: Bilaterally diminished breath sound, no wheezing,no use of accessory muscle Cardiovascular system: S1 & S2 +, No JVD. Gastrointestinal system: Abdomen soft,NT,ND, BS+ Nervous System: Alert, awake, moving all extremities,and following commands. Extremities: extremities warm, leg edema neg Skin: Warm, no rashes MSK: Normal muscle bulk,tone, power   Medications reviewed:  Scheduled Meds:  Acoramidis  (712MG  Twice Daily)  712 mg Oral BID   arformoterol   15 mcg Nebulization BID   aspirin  EC  81 mg Oral Daily   atorvastatin   80 mg Oral Daily   bisoprolol   5 mg Oral Daily   budesonide  (PULMICORT ) nebulizer solution  0.25 mg Nebulization BID   doxycycline   100 mg Oral Q12H   furosemide   40 mg Intravenous Q12H   heparin   5,000 Units Subcutaneous Q8H   insulin  aspart  0-9 Units Subcutaneous TID WC   ipratropium-albuterol   3 mL Nebulization TID   isosorbide -hydrALAZINE   0.5 tablet Oral TID   montelukast   10 mg Oral QHS   nicotine   14 mg Transdermal Daily   predniSONE   40 mg Oral Q breakfast   revefenacin   175 mcg Nebulization Daily   sodium chloride  flush  3 mL Intravenous Q12H   Continuous Infusions:  sodium chloride      Diet: Diet Order             Diet Heart Room service appropriate? Yes; Fluid consistency: Thin  Diet effective now                    Data Reviewed: I have personally reviewed following labs and imaging studies ( see epic result tab) CBC: Recent Labs  Lab 10/26/24 1556  WBC 4.5  NEUTROABS 3.0  HGB 12.6*  HCT 38.0*  MCV 91.1  PLT 210   CMP: Recent Labs  Lab 10/26/24 1556 10/27/24 0409  NA 136 138  K 3.7 3.8  CL 99 98  CO2 26 25  GLUCOSE 113* 156*  BUN 32* 27*  CREATININE 1.93* 1.70*  CALCIUM  9.0 9.2   GFR: Estimated Creatinine Clearance: 33.2 mL/min (A) (by C-G formula based on SCr of 1.7 mg/dL (H)). Recent Labs  Lab 10/26/24 1556 10/27/24 0409  AST 70* 56*  ALT 67* 66*  ALKPHOS 141* 118  BILITOT 1.4* 1.1  PROT 7.2 7.0  ALBUMIN 4.0 3.9   No results for input(s): LIPASE, AMYLASE in the last 168 hours. No results for input(s): AMMONIA in the last 168 hours. Coagulation Profile: No results for input(s): INR, PROTIME in the last 168 hours. Unresulted Labs (From admission, onward)     Start     Ordered   10/27/24 0754  Hemoglobin A1c  Once,   R  Comments: To assess prior glycemic control   Question:  Specimen collection method  Answer:  Lab=Lab collect   10/27/24 0753   10/27/24 0748  Respiratory (~20 pathogens) panel by PCR  (Respiratory panel by PCR (~20 pathogens, ~24 hr TAT)  w precautions)  Once,   R        10/27/24 0747   10/27/24 0500  Basic metabolic panel  Daily,   R     Comments: As Scheduled for 5 days    10/26/24 1832           Antimicrobials/Microbiology: Anti-infectives (From admission, onward)    Start     Dose/Rate Route Frequency Ordered Stop   10/27/24 1000  doxycycline  (VIBRA -TABS) tablet 100 mg        100 mg Oral Every 12 hours 10/26/24 1832     10/26/24 1530  cefTRIAXone  (ROCEPHIN ) 1 g in sodium chloride  0.9 % 100 mL IVPB        1 g 200 mL/hr over 30 Minutes Intravenous  Once 10/26/24 1519 10/26/24 1754   10/26/24 1530  azithromycin  (ZITHROMAX ) 500 mg in sodium chloride  0.9 % 250 mL IVPB  Status:  Discontinued        500 mg 250 mL/hr over 60  Minutes Intravenous  Once 10/26/24 1519 10/26/24 1524   10/26/24 1530  azithromycin  (ZITHROMAX ) tablet 500 mg        500 mg Oral  Once 10/26/24 1524 10/26/24 1709         Component Value Date/Time   SDES BLOOD SITE NOT SPECIFIED 03/06/2022 0902   SPECREQUEST  03/06/2022 0902    BOTTLES DRAWN AEROBIC AND ANAEROBIC Blood Culture results may not be optimal due to an inadequate volume of blood received in culture bottles   CULT  03/06/2022 0902    NO GROWTH 5 DAYS Performed at [facility], [address], [place], [place] [address]    REPTSTATUS 03/11/2022 FINAL 03/06/2022 9097    Procedures:    Mennie LAMY, MD [facility] 10/27/2024, 11:09 AM   "

## 2024-10-27 NOTE — Progress Notes (Signed)
" °  Echocardiogram 2D Echocardiogram has been performed.  Tinnie FORBES Gosling RDCS 10/27/2024, 9:44 AM "

## 2024-10-27 NOTE — Consult Note (Addendum)
 "  Cardiology Consultation   Patient ID: Marcus Tran MRN: [id number]; DOB: Nov 20, 1950  Admit date: 10/26/2024 Date of Consult: 10/27/2024  PCP:  [name], NP   [facility] Providers Cardiologist:  Lynwood Schilling, MD  Advanced Heart Failure:  Ezra Shuck, MD       Patient Profile: Marcus Tran is a 74 y.o. 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 Christobal.  History of Present Illness: Marcus Tran 74 year old male with the above history primarily followed by Dr. Shuck for CHF.  He was initially diagnosed with this during admission in 02/2022 for acute hypoxic respiratory failure secondary to COPD and CHF.  Echo at that time showed LVEF of 35% with grade 2 diastolic dysfunction and normal RV with elevated RVSP of 54.  R/LHC at that time showed nonobstructive disease. Outpatient monitor in 03/2022 showed underlying sinus rhythm with runs of NSVT, runs of idioventricular rhythm, and significant bradycardia arrhythmias during sleeping hours (including 3rd degree heart block).  Bradycardia was thought to be due to untreated severe sleep apnea.  He underwent a sleep study in 06/2022 which did show severe obstructive sleep apnea.  However, he declined in lab BiPAP titration.  He has had several other admissions for COPD and CHF since that time.  PYP scan in 03/2023 was strongly suggestive of TTR.  He was started on tafamidis  but developed a severe rash/dermatitis so this was stopped. Last Echo in 10/24 showed LVEF of 40% with moderate LVH and global hypokinesis as well as mild RV enlargement with mildly reduced RV  function. Cardiac MRI in 09/2023 showed LVEF of 40% and RVEF of 46% as well as elevated ECV consistent with cardiac amyloidosis. However, LGE pattern was not classic for this. PET scan in 06/2053 was not consistent with active sarcoidosis.  He was last seen in the CHF clinic in 08/2024 at which time he was stable from a cardiac standpoint. 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 He was referred for genetic counseling for cardiomyopathies.   Patient presented to the ED on 10/26/2024 via EMS after presenting with worsening shortness of breath.  O2 sats were noted to be in the 80s on arrival.  EKG showed normal sinus rhythm, rate 76 BPM, with RBBB and LAFB as well as downsloping ST depressions and abnormal T waves in leads V3-V6 have been seen on previous EKGs.  High-sensitivity troponin minimally elevated and flat at 49 >> 46. Pro-BNP elevated at 6,268.  Chest x-ray showed mild bibasilar atelectasis and/ or infiltrate. WBC 4.5, Hgb 12.6, Plts 210. Na 136, K 3.7, Glucose 113, BUN 32, Cr 1.93. Albumin 4.0, AST 70, ALT 67, Alk Phos 141, Total Bili 1.4. Venous blood gas showed pH 7.42, pO2 75, pCO2 45, Bicarb 29. Respiratory panel negative for COVID, influenza A/B, and RSV.  He was admitted for acute hypoxic respiratory tori failure sepsis felt to be secondary to his COPD exacerbation and acute CHF.  He was started on nebulizers and Doxycycline  as well as IV Lasix .  Repeat Echo was ordered and showed LVEF of 40-45% with akinesis of the inferior wall, basal anteroseptal segment, mid  inferior septal segment, and basal inferior septal segment is well as grade 1 diastolic dysfunction.  No significant changes compared to prior Echo. Cardiology has been consulted to assist with CHF management.  Patient reports he started having dyspnea with minimal exertion such as walking around his house about 3 to 4 days ago.  He denies any shortness of breath at rest.  No orthopnea, PND, or edema.  No chest pain, palpitations,  lightheadedness/dizziness, or syncope.  He does not weigh himself regularly at home, so he is not sure if he has gained any weight. He does admit to eating food high in sodium over the holidays.  He reports a mildly productive cough over the last few weeks but no fevers.  His grandkids who live with him also been dealing with some viral illnesses with URI and symptoms lately.  He is feeling much better after the treatment started last night.  He unfortunately continues to smoke.  He smokes 1 pack cigarettes about every 3 days.   Past Medical History:  Diagnosis Date   Acute metabolic encephalopathy 03/08/2022   Acute respiratory failure with hypoxia (HCC) 03/20/2022   Acute respiratory failure with hypoxia and hypercapnia (HCC) 03/06/2022   CHF (congestive heart failure) (HCC)    Dyslipidemia    Hypertension     Past Surgical History:  Procedure Laterality Date   LEG SURGERY     Right-sided as result of trauma   RIGHT/LEFT HEART CATH AND CORONARY ANGIOGRAPHY N/A 03/09/2022   Procedure: RIGHT/LEFT HEART CATH AND CORONARY ANGIOGRAPHY;  Surgeon: Court Dorn PARAS, MD;  Location: [facility];  Service: Cardiovascular;  Laterality: N/A;   Umbilicus surgery     As a child     Home Medications:  Prior to Admission medications  Medication Sig Start Date End Date Taking? Authorizing Provider  Acoramidis , 712MG  Twice Daily, (ATTRUBY ) 356 MG TBPK Take 712 mg by mouth 2 (two) times daily. Patient not taking: Reported on 10/20/2024 08/23/24   Rolan Ezra RAMAN, MD  albuterol  (VENTOLIN  HFA) 108 [phone] Base) MCG/ACT inhaler Inhale 2 puffs into the lungs every 4 (four) hours as needed for wheezing or shortness of breath. 01/16/23   Gonfa, Taye T, MD  aspirin  EC 81 MG tablet Take 1 tablet (81 mg total) by mouth daily. Swallow whole. 06/14/24 06/14/25  Glena Harlene HERO, FNP  atorvastatin  (LIPITOR ) 80 MG tablet TAKE 1 Tablet BY MOUTH ONCE DAILY EVERY EVENING 03/25/23   Waddell Danelle ORN, MD  bisoprolol  (ZEBETA )  5 MG tablet Take 1 Tablet by mouth once daily (morning) 04/19/24   Rolan Ezra RAMAN, MD  dapagliflozin  propanediol (FARXIGA ) 10 MG TABS tablet TAKE 1 Tablet BY MOUTH ONCE DAILY (MORNING) 09/11/24   Milford, Harlene HERO, FNP  furosemide  (LASIX ) 20 MG tablet Take 1 tablet (20 mg total) by mouth daily. 06/15/24   Milford, Harlene HERO, FNP  ipratropium-albuterol  (DUONEB) 0.5-2.5 (3) MG/3ML SOLN Take 3 mLs by nebulization as needed.    [provider]  isosorbide -hydrALAZINE  (BIDIL ) 20-37.5 MG tablet Take 0.5 tablets by mouth 3 (three) times daily. 08/16/24   Marcine Catalan M, PA-C  metFORMIN  (GLUCOPHAGE ) 500 MG tablet Take 1 tablet (500 mg total) by mouth 2 (two) times daily with a meal. 05/07/22   Dennise Lavada POUR, MD  montelukast  (SINGULAIR ) 10 MG tablet Take 1 tablet (10 mg total) by mouth at bedtime. Patient not taking: Reported on 10/20/2024 05/17/24   Hunsucker, Donnice SAUNDERS, MD  spironolactone  (ALDACTONE ) 25 MG tablet Take 0.5 tablets (  12.5 mg total) by mouth daily. Patient not taking: Reported on 10/22/2024 09/13/24   Lee, Jordan, NP  TRELEGY ELLIPTA 100-62.5-25 MCG/ACT AEPB Take 1 puff by mouth daily. 02/23/23   [provider]    Scheduled Meds:  Acoramidis  (712MG  Twice Daily)  712 mg Oral BID   arformoterol   15 mcg Nebulization BID   aspirin  EC  81 mg Oral Daily   atorvastatin   80 mg Oral Daily   bisoprolol   5 mg Oral Daily   budesonide  (PULMICORT ) nebulizer solution  0.25 mg Nebulization BID   doxycycline   100 mg Oral Q12H   furosemide   40 mg Intravenous Q12H   heparin   5,000 Units Subcutaneous Q8H   insulin  aspart  0-9 Units Subcutaneous TID WC   ipratropium-albuterol   3 mL Nebulization TID   isosorbide -hydrALAZINE   0.5 tablet Oral TID   montelukast   10 mg Oral QHS   nicotine   14 mg Transdermal Daily   predniSONE   40 mg Oral Q breakfast   revefenacin   175 mcg Nebulization Daily   sodium chloride  flush  3 mL Intravenous Q12H   Continuous Infusions:  sodium chloride       PRN Meds: sodium chloride , acetaminophen , hydrALAZINE , ondansetron  (ZOFRAN ) IV, perflutren  lipid microspheres (DEFINITY ) IV suspension, sodium chloride  flush  Allergies:   Allergies[1]  Social History:   Social History   Socioeconomic History   Marital status: Single    Spouse name: Not on file   Number of children: 1   Years of education: Not on file   Highest education level: 10th grade  Occupational History   Occupation: Disability  Tobacco Use   Smoking status: Some Days    Current packs/day: 0.00    Average packs/day: 0.5 packs/day for 50.0 years (25.0 ttl pk-yrs)    Types: Cigarettes    Start date: 03/19/1972    Last attempt to quit: 03/19/2022    Years since quitting: 2.6   Smokeless tobacco: Never   Tobacco comments:    Smoking cessation  Vaping Use   Vaping status: Never Used  Substance and Sexual Activity   Alcohol use: Not on file    Comment: socially   Drug use: Yes    Types: Marijuana    Comment: past   Sexual activity: Not on file  Other Topics Concern   Not on file  Social History Narrative   He lives with his daughter apparently and 5 grandchildren.  He reports that he smokes probably less than a pack of cigarettes a day.  He occasionally drinks a beer or liquor but not daily.   Social Drivers of Health   Tobacco Use: High Risk (09/08/2024)   Patient History    Smoking Tobacco Use: Some Days    Smokeless Tobacco Use: Never    Passive Exposure: Not on file  Financial Resource Strain: Low Risk (03/11/2022)   Overall Financial Resource Strain (CARDIA)    Difficulty of Paying Living Expenses: Not very hard  Food Insecurity: No Food Insecurity (10/26/2024)   Epic    Worried About Programme Researcher, Broadcasting/film/video in the Last Year: Never true    Ran Out of Food in the Last Year: Never true  Transportation Needs: Unmet Transportation Needs (10/26/2024)   Epic    Lack of Transportation (Medical): Yes    Lack of Transportation (Non-Medical): Yes  Physical Activity: Not  on file  Stress: Not on file  Social Connections: Moderately Isolated (10/26/2024)   Social Connection and Isolation Panel    Frequency of  Communication with Friends and Family: More than three times a week    Frequency of Social Gatherings with Friends and Family: More than three times a week    Attends Religious Services: 1 to 4 times per year    Active Member of [employer] or Organizations: No    Attends Banker Meetings: Never    Marital Status: Never married  Intimate Partner Violence: Not At Risk (10/26/2024)   Epic    Fear of Current or Ex-Partner: No    Emotionally Abused: No    Physically Abused: No    Sexually Abused: No  Depression (PHQ2-9): Not on file  Alcohol Screen: Low Risk (05/06/2022)   Alcohol Screen    Last Alcohol Screening Score (AUDIT): 0  Housing: Low Risk (10/26/2024)   Epic    Unable to Pay for Housing in the Last Year: No    Number of Times Moved in the Last Year: 0    Homeless in the Last Year: No  Utilities: Not At Risk (10/26/2024)   Epic    Threatened with loss of utilities: No  Health Literacy: Not on file    Family History:   Family History  Problem Relation Age of Onset   Hypertension Mother      ROS:  Please see the history of present illness.   Physical Exam/Data: Vitals:   10/27/24 0421 10/27/24 0630 10/27/24 0739 10/27/24 0742  BP:  (!) 145/99    Pulse:  77    Resp:  20    Temp:  98.3 F (36.8 C)    TempSrc:  Oral    SpO2:  98% 95% 95%  Weight: 62.7 kg     Height:        Intake/Output Summary (Last 24 hours) at 10/27/2024 1133 Last data filed at 10/27/2024 0826 Gross per 24 hour  Intake 340 ml  Output 2450 ml  Net -2110 ml      10/27/2024    4:21 AM 10/26/2024    7:00 PM 09/08/2024   10:51 AM  Last 3 Weights  Weight (lbs) 138 lb 3.2 oz 139 lb 5.3 oz 138 lb 12.8 oz  Weight (kg) 62.687 kg 63.2 kg 62.959 kg     Body mass index is 23 kg/m.  General: 74 y.o. African-American male resting comfortably in no acute  distress. HEENT: Normocephalic and atraumatic. Sclera clear.  Neck: Supple. No JVD. Heart: RRR. Distinct S1 and S2. No murmurs, gallops, or rubs.  Lungs: No increased work of breathing. Clear to ausculation bilaterally. Initially he had some mild rhonchi in bases but this cleared with coughing. No wheezes or rales.   Abdomen: Soft, non-distended, and non-tender to palpation. Extremities: No lower extremity edema. Skin: Warm and dry. Neuro: Alert and oriented x3. No focal deficits. Psych: Normal affect. Responds appropriately.  EKG:  The EKG was personally reviewed and demonstrates:  Normal sinus rhythm, rate 76 BPM, with RBBB and LAFB as well as downsloping ST depressions and abnormal T waves in leads V3-V6 have been seen on previous EKGs. Telemetry:  Telemetry was personally reviewed and demonstrates: Normal sinus rhythm with rates in the 80s.  Relevant CV Studies:  Echocardiogram 10/27/2024: Impressions: 1. Left ventricular ejection fraction, by estimation, is 40 to 45%. The  left ventricle has mildly decreased function. The left ventricle  demonstrates regional wall motion abnormalities (see scoring  diagram/findings for description). The left ventricular   internal cavity size was moderately dilated. Left ventricular diastolic  parameters are consistent with  Grade I diastolic dysfunction (impaired  relaxation).   2. Right ventricular systolic function is mildly reduced. The right  ventricular size is normal. There is mildly elevated pulmonary artery  systolic pressure.   3. Left atrial size was mildly dilated.   4. Right atrial size was mildly dilated.   5. The mitral valve is degenerative. Trivial mitral valve regurgitation.  No evidence of mitral stenosis.   6. The tricuspid valve is degenerative.   7. The aortic valve is tricuspid. Aortic valve regurgitation is trivial.  Aortic valve sclerosis/calcification is present, without any evidence of  aortic stenosis.   8. Aortic  dilatation noted. There is borderline dilatation of the  ascending aorta, measuring 37 mm.   9. The inferior vena cava is normal in size with greater than 50%  respiratory variability, suggesting right atrial pressure of 3 mmHg.   Comparison(s): A prior study was performed on 08/13/23. Similar ejection  fraction, previously reported global hypokinesis however on side by side  comparison, there were similar wall motion abnormalities previously. No  other significant changes.    Laboratory Data: High Sensitivity Troponin:  No results for input(s): TROPONINIHS in the last 720 hours.  Recent Labs  Lab 10/26/24 1556 10/26/24 1827  TRNPT 49* 46*      Chemistry Recent Labs  Lab 10/26/24 1556 10/27/24 0409  NA 136 138  K 3.7 3.8  CL 99 98  CO2 26 25  GLUCOSE 113* 156*  BUN 32* 27*  CREATININE 1.93* 1.70*  CALCIUM  9.0 9.2  GFRNONAA 36* 42*  ANIONGAP 12 15    Recent Labs  Lab 10/26/24 1556 10/27/24 0409  PROT 7.2 7.0  ALBUMIN 4.0 3.9  AST 70* 56*  ALT 67* 66*  ALKPHOS 141* 118  BILITOT 1.4* 1.1   Lipids No results for input(s): CHOL, TRIG, HDL, LABVLDL, LDLCALC, CHOLHDL in the last 168 hours.  Hematology Recent Labs  Lab 10/26/24 1556  WBC 4.5  RBC 4.17*  HGB 12.6*  HCT 38.0*  MCV 91.1  MCH 30.2  MCHC 33.2  RDW 14.7  PLT 210   Thyroid No results for input(s): TSH, FREET4 in the last 168 hours.  BNP Recent Labs  Lab 10/26/24 1556 10/27/24 0409  PROBNP 6,268.0* 3,938.0*    DDimer No results for input(s): DDIMER in the last 168 hours.  Radiology/Studies:  ECHOCARDIOGRAM COMPLETE Result Date: 10/27/2024    ECHOCARDIOGRAM REPORT   Patient Name:   Marcus Tran Date of Exam: 10/27/2024 Medical Rec #:  [id number]    Height:       65.0 in Accession #:    [id number]   Weight:       138.2 lb Date of Birth:  05-27-1951    BSA:          1.691 m Patient Age:    73 years     BP:           145/99 mmHg Patient Gender: M            HR:           67 bpm.  Exam Location:  Inpatient Procedure: 2D Echo, Cardiac Doppler, Color Doppler and Intracardiac            Opacification Agent (Both Spectral and Color Flow Doppler were            utilized during procedure). Indications:    CHF I50.21  History:        Patient has prior history of Echocardiogram  examinations, most                 recent 03/06/2022. CHF; Risk Factors:Hypertension and                 Dyslipidemia.  Sonographer:    Tinnie Gosling RDCS Referring Phys: [id number] JOETTE PEBBLES IMPRESSIONS  1. Left ventricular ejection fraction, by estimation, is 40 to 45%. The left ventricle has mildly decreased function. The left ventricle demonstrates regional wall motion abnormalities (see scoring diagram/findings for description). The left ventricular  internal cavity size was moderately dilated. Left ventricular diastolic parameters are consistent with Grade I diastolic dysfunction (impaired relaxation).  2. Right ventricular systolic function is mildly reduced. The right ventricular size is normal. There is mildly elevated pulmonary artery systolic pressure.  3. Left atrial size was mildly dilated.  4. Right atrial size was mildly dilated.  5. The mitral valve is degenerative. Trivial mitral valve regurgitation. No evidence of mitral stenosis.  6. The tricuspid valve is degenerative.  7. The aortic valve is tricuspid. Aortic valve regurgitation is trivial. Aortic valve sclerosis/calcification is present, without any evidence of aortic stenosis.  8. Aortic dilatation noted. There is borderline dilatation of the ascending aorta, measuring 37 mm.  9. The inferior vena cava is normal in size with greater than 50% respiratory variability, suggesting right atrial pressure of 3 mmHg. Comparison(s): A prior study was performed on 08/13/23. Similar ejection fraction, previously reported global hypokinesis however on side by side comparison, there were similar wall motion abnormalities previously. No other significant changes.  FINDINGS  Left Ventricle: Visually moderate LVH. Left ventricular ejection fraction, by estimation, is 40 to 45%. The left ventricle has mildly decreased function. The left ventricle demonstrates regional wall motion abnormalities. Definity  contrast agent was given IV to delineate the left ventricular endocardial borders. The left ventricular internal cavity size was moderately dilated. There is no left ventricular hypertrophy. Left ventricular diastolic parameters are consistent with Grade I diastolic dysfunction (impaired relaxation).  LV Wall Scoring: The entire inferior wall, basal anteroseptal segment, mid inferoseptal segment, and basal inferoseptal segment are akinetic. Right Ventricle: The right ventricular size is normal. No increase in right ventricular wall thickness. Right ventricular systolic function is mildly reduced. There is mildly elevated pulmonary artery systolic pressure. The tricuspid regurgitant velocity  is 3.05 m/s, and with an assumed right atrial pressure of 3 mmHg, the estimated right ventricular systolic pressure is 40.2 mmHg. Left Atrium: Left atrial size was mildly dilated. Right Atrium: Right atrial size was mildly dilated. Pericardium: There is no evidence of pericardial effusion. Mitral Valve: The mitral valve is degenerative in appearance. Mild mitral annular calcification. Trivial mitral valve regurgitation. No evidence of mitral valve stenosis. Tricuspid Valve: The tricuspid valve is degenerative in appearance. Tricuspid valve regurgitation is mild . No evidence of tricuspid stenosis. Aortic Valve: The aortic valve is tricuspid. Aortic valve regurgitation is trivial. Aortic valve sclerosis/calcification is present, without any evidence of aortic stenosis. Pulmonic Valve: The pulmonic valve was normal in structure. Pulmonic valve regurgitation is not visualized. No evidence of pulmonic stenosis. Aorta: The aortic root is normal in size and structure and aortic dilatation noted.  There is borderline dilatation of the ascending aorta, measuring 37 mm. Venous: The inferior vena cava is normal in size with greater than 50% respiratory variability, suggesting right atrial pressure of 3 mmHg. IAS/Shunts: No atrial level shunt detected by color flow Doppler.  LEFT VENTRICLE PLAX 2D LVIDd:         5.40  cm      Diastology LVIDs:         4.50 cm      LV e' medial:    3.48 cm/s LV PW:         0.80 cm      LV E/e' medial:  17.5 LV IVS:        0.70 cm      LV e' lateral:   5.33 cm/s LVOT diam:     1.90 cm      LV E/e' lateral: 11.4 LV SV:         39 LV SV Index:   23 LVOT Area:     2.84 cm LV IVRT:       127 msec  LV Volumes (MOD) LV vol d, MOD A2C: 112.0 ml LV vol d, MOD A4C: 122.0 ml LV vol s, MOD A2C: 44.7 ml LV vol s, MOD A4C: 39.1 ml LV SV MOD A2C:     67.3 ml LV SV MOD A4C:     122.0 ml LV SV MOD BP:      76.7 ml RIGHT VENTRICLE            IVC RV S prime:     8.27 cm/s  IVC diam: 1.40 cm TAPSE (M-mode): 2.3 cm LEFT ATRIUM             Index        RIGHT ATRIUM           Index LA diam:        3.30 cm 1.95 cm/m   RA Area:     15.40 cm LA Vol (A2C):   41.0 ml 24.25 ml/m  RA Volume:   41.60 ml  24.61 ml/m LA Vol (A4C):   74.3 ml 43.95 ml/m LA Biplane Vol: 55.6 ml 32.89 ml/m  AORTIC VALVE             PULMONIC VALVE LVOT Vmax:   74.40 cm/s  PV Vmax:       3.05 m/s LVOT Vmean:  47.400 cm/s PV Peak grad:  37.2 mmHg LVOT VTI:    0.139 m  AORTA Ao Root diam: 3.00 cm Ao Asc diam:  3.70 cm MITRAL VALVE               TRICUSPID VALVE MV Area (PHT): 3.49 cm    TR Peak grad:   37.2 mmHg MV E velocity: 60.80 cm/s  TR Vmax:        305.00 cm/s MV A velocity: 77.60 cm/s MV E/A ratio:  0.78        SHUNTS                            Systemic VTI:  0.14 m                            Systemic Diam: 1.90 cm Emeline Calender Electronically signed by Emeline Calender Signature Date/Time: 10/27/2024/11:22:59 AM    Final    DG Chest Portable 1 View Result Date: 10/26/2024 CLINICAL DATA:  Difficulty breathing. EXAM: PORTABLE CHEST  1 VIEW COMPARISON:  September 14, 2023 FINDINGS: The heart size and mediastinal contours are within normal limits. Very mildly increased interstitial lung markings are seen mild areas of atelectasis and/or infiltrate are noted within the bilateral lung bases. No pleural effusion or pneumothorax is identified. The visualized skeletal structures are unremarkable. IMPRESSION:  1. Mild bibasilar atelectasis and/or infiltrate. 2. Mildly increased interstitial lung markings which may be, in part, chronic in nature. Mild interstitial edema cannot be excluded. Electronically Signed   By: Suzen Dials M.D.   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  Initially diagnosed in 02/2022 when found to have an EF of 35%. PYP scan in 03/2023 was strongly suggestive of TTR.  He was started on tafamidis  but developed a severe rash/dermatitis so this was stopped. Last Echo in 10/24 showed LVEF of 40% with moderate LVH and global hypokinesis as well as mild RV enlargement with mildly reduced RV function. Cardiac MRI in 09/2023 showed LVEF of 40% and RVEF of 46% as well as elevated ECV consistent with cardiac amyloidosis. However, LGE pattern was not classic for this. PET scan in 06/2053 was not consistent with active sarcoidosis.  He has been admitted for acute hypoxic respiratory failure could be secondary to CHF and COPD. Pro BNP elevated in the 6000's. Chest x-ray showed mild bibasilar atelectasis and/ or infiltrate. Echo showed LVEF of 40-45% with akinesis of the inferior wall, basal anteroseptal segment, mid inferior septal segment, and basal inferior septal segment is well as grade 1 diastolic dysfunction.  No significant changes compared to prior Echo.  He was started on IV Lasix  urine output.  Net -2.1 L so far.  - He is feeling much better and back to his baseline. Euvolemic  on exam. Weight was 138 lbs today which is stable from where it was at last OV in 08/2024.  - He is currently on IV Lasix  40 mg twice daily.  Think we can likely go ahead and switch back PO Lasix . Will review with MD.  - Continue Bisoprolol  5mg  daily.  - Continue Bidil  0.5 tablet three times daily. - Will resume home Farxiga  10mg  daily.  - Previously on Entresto  and Spironolactone  but these were recently stopped due to renal function and hyperkalemia.  Elevated Troponin Non-Obstructive CAD Noted on cardiac catheterization in 2023. High-sensitivity troponin minimally elevated and flat at 49 >> 46. EKG showed downsloping ST depressions and abnormal T waves in leads V3-V6 but this has been seen on previous EKGs. Echo showed LVEF of 40-45% with some wall motion abnormalities as above that were similar to prior EKG. - No chest pain.  - Continue Aspirin  81mg  daily and Lipitor  80mg  daily. - Troponin trend not consistent with ACS. Consistent with demand ischemia from acute illness.  Non-Sustained VT Nocturnal Complete Heart Block Noted to have NSVT and intermittent complete heart block during sleeping hours on monitor in 2023. CHB was felt to be due to severe obstructive sleep apnea. He has not required PPM. - No NSVT or CHB noted on telemetry. No lightheadedness/ dizziness or syncope.  - Continue Bisoprolol  5mg  daily.  Hypertension BP initially 185/115 on arrival but has improved. Last documented BP 145/99 this morning but this was before any medications had been given.  - Continue Bisoprolol  5mg  daily. - Continue Bidil  0.5 tablet three times daily. - If BP remains elevated, could increased Bidil  to a full tablet three times daily.  Hyperlipidemia - Contineu Lipitor  80mg  daily.  CKD Stage III Creatinine 1.93 on admission which is around recent baseline. - Creatinine improved to 1.70 today.  - Continue to monitor.  Otherwise, per primary team: - COPD exacerbation - Mildly abnormal LFTs -  Tobacco abuse  Risk Assessment/Risk Scores:   [place]  Heart Association (NYHA) Functional Class NYHA Class III   For questions or updates, please contact [facility] Please consult [email or web address] for contact info under      Signed, Callie E Goodrich, PA-C  10/27/2024 11:33 AM  I have personally seen and examined this patient. I agree with the assessment and plan as outlined above.  74 yo male with history of NICM, CAD, possible cardiac amyloidosis, NSVT, OSA, COPD, HTN, HLD and ongoing tobacco abuse with COPD admitted with respiratory distress felt to be due to combination of COPD and acute on chronic systolic CHF. He is feeling much better after diuresis with IV Lasix .  NO dyspnea today.   Tele: sinus Labs reviewed by me  My exam:  NAD RRR Lungs clear Ext: no LE edema  Plan:   NICM/acute on chronic systolic CHF: He is euvolemic following diuresis.  Stop IV Lasix .  Start Lasix  20 mg po daily tomorrow Continue Bisoprolol  and Bidil .  Restart Farxiga   Lonni Cash, MD, [facility] 10/27/2024 2:53 PM       [1]  Allergies Allergen Reactions   Vyndamax  [Tafamidis ] Dermatitis    Allergic dermatitis/rash   "

## 2024-10-27 NOTE — Progress Notes (Signed)
 Heart Failure Navigator Progress Note  Assessed for Heart & Vascular TOC clinic readiness.  Patient does not meet criteria due to he is an Advanced Heart Failure Team patient. .   Navigator will sign off at this time.   Stephane Haddock, BSN, Scientist, Clinical (histocompatibility And Immunogenetics) Only

## 2024-10-27 NOTE — TOC Initial Note (Signed)
 Transition of Care [facility]) - Initial/Assessment Note    Patient Details  Name: Marcus Tran MRN: [id number] Date of Birth: 1951-08-20  [facility] CM/SW Contact:    Marcus Manuella Quill, RN Phone Number: 10/27/2024, 11:53 AM  Clinical Narrative:                 Spoke w/ pt and dtr Marcus Tran [phone]) in room; pt said he lives at home w/ his dtr; he plans to return w/ her support at d/c; family will provide transportation; insurance/PCP verified; he denied SDOH risks; pt does not have DME, HH services, or home oxygen; IP CM is following.  Expected Discharge Plan: Home/Self Care Barriers to Discharge: Continued Medical Work up   Patient Goals and CMS Choice Patient states their goals for this hospitalization and ongoing recovery are:: home     Marcus Tran ownership interest in [facility].provided to:: Patient    Expected Discharge Plan and Services   Discharge Planning Services: CM Consult   Living arrangements for the past 2 months: Single Family Home                 DME Arranged: N/A DME Agency: NA       HH Arranged: NA HH Agency: NA        Prior Living Arrangements/Services Living arrangements for the past 2 months: Single Family Home Lives with:: Adult Children Patient language and need for interpreter reviewed:: Yes Do you feel safe going back to the place where you live?: Yes      Need for Family Participation in Patient Care: Yes (Comment) Care giver support system in place?: Yes (comment) Current home services:  (n/a) Criminal Activity/Legal Involvement Pertinent to Current Situation/Hospitalization: No - Comment as needed  Activities of Daily Living   ADL Screening (condition at time of admission) Independently performs ADLs?: Yes (appropriate for developmental age) Is the patient deaf or have difficulty hearing?: No Does the patient have difficulty seeing, even when wearing glasses/contacts?: No Does the patient have  difficulty concentrating, remembering, or making decisions?: No  Permission Sought/Granted Permission sought to share information with : Case Manager Permission granted to share information with : Yes, Verbal Permission Granted  Share Information with NAME: Case Manager     Permission granted to share info w Relationship: Marcus Tran (dtr) [phone]     Emotional Assessment Appearance:: Appears stated age Attitude/Demeanor/Rapport: Gracious Affect (typically observed): Accepting Orientation: : Oriented to Self, Oriented to Place, Oriented to  Time, Oriented to Situation Alcohol / Substance Use: Not Applicable Psych Involvement: No (comment)  Admission diagnosis:  COPD exacerbation (HCC) [J44.1] Acute on chronic systolic congestive heart failure (HCC) [I50.23] Acute systolic CHF (congestive heart failure) (HCC) [I50.21] Acute hypoxic respiratory failure (HCC) [J96.01] Patient Active Problem List   Diagnosis Date Noted   Acute systolic CHF (congestive heart failure) (HCC) 10/26/2024   Cocaine use 01/15/2023   Noncompliance 01/13/2023   Adenovirus pneumonia 11/12/2022   Chronic HFrEF (heart failure with reduced ejection fraction) (HCC) 08/13/2022   DM2 (diabetes mellitus, type 2) (HCC) 08/13/2022   Obstructive sleep apnea (adult) (pediatric) 08/08/2022   Acute respiratory failure with hypoxia and hypercapnia (HCC) 05/04/2022   Third degree heart block (HCC) 04/15/2022   Snoring 04/15/2022   Nonischemic cardiomyopathy (HCC) 04/15/2022   HFrEF (heart failure with reduced ejection fraction) (HCC) 04/15/2022   COPD with acute exacerbation (HCC) 03/22/2022   Acute respiratory failure with hypoxia (HCC) 03/20/2022   Chronic combined systolic and  diastolic CHF (congestive heart failure) (HCC)    Tobacco use disorder 03/08/2022   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 [place] [address] Phone: [phone] Fax: [phone]  Marcus Tran - [facility] [address] [place] [address] Phone: [phone] Fax: [phone]     Social Drivers of Health (SDOH) Social History: SDOH Screenings   Food Insecurity: No Food Insecurity (10/27/2024)  Housing: Low Risk (10/27/2024)  Transportation Needs: No Transportation Needs (10/27/2024)  Recent Concern: Transportation Needs - Unmet Transportation Needs (10/26/2024)  Utilities: Not At Risk (10/27/2024)  Alcohol Screen: Low Risk (05/06/2022)  Financial Resource Strain: Low Risk (03/11/2022)  Social Connections: Moderately Isolated (10/26/2024)  Tobacco Use: High Risk (09/08/2024)   SDOH Interventions: Food Insecurity Interventions: Intervention Not Indicated, Inpatient TOC Housing Interventions: Intervention Not Indicated, Inpatient TOC Transportation Interventions: Intervention Not Indicated, Inpatient TOC Utilities Interventions: Intervention Not Indicated, Inpatient TOC   Readmission Risk Interventions    10/27/2024   11:48 AM 05/06/2022   12:16 PM  Readmission Risk Prevention Plan  Transportation Screening Complete Complete  PCP or Specialist Appt within 3-5 Days Complete Complete  HRI or Home Care Consult Complete Complete  Social Work Consult for Recovery Care Planning/Counseling Complete Complete  Palliative Care Screening Not Applicable Not Applicable  Medication Review Oceanographer) Complete Complete

## 2024-10-27 NOTE — Progress Notes (Signed)
 IP CM acknowledges order for Heart Failure Home Health Screen; orders previously placed for Heart Failure Navigation Team; clearing consult.

## 2024-10-28 ENCOUNTER — Other Ambulatory Visit (HOSPITAL_COMMUNITY): Payer: Self-pay

## 2024-10-28 DIAGNOSIS — I5021 Acute systolic (congestive) heart failure: Secondary | ICD-10-CM | POA: Diagnosis not present

## 2024-10-28 DIAGNOSIS — I43 Cardiomyopathy in diseases classified elsewhere: Secondary | ICD-10-CM

## 2024-10-28 DIAGNOSIS — E854 Organ-limited amyloidosis: Secondary | ICD-10-CM

## 2024-10-28 LAB — BASIC METABOLIC PANEL WITH GFR
Anion gap: 14 (ref 5–15)
BUN: 36 mg/dL — ABNORMAL HIGH (ref 8–23)
CO2: 26 mmol/L (ref 22–32)
Calcium: 9.1 mg/dL (ref 8.9–10.3)
Chloride: 91 mmol/L — ABNORMAL LOW (ref 98–111)
Creatinine, Ser: 1.99 mg/dL — ABNORMAL HIGH (ref 0.61–1.24)
GFR, Estimated: 35 mL/min — ABNORMAL LOW
Glucose, Bld: 153 mg/dL — ABNORMAL HIGH (ref 70–99)
Potassium: 4.3 mmol/L (ref 3.5–5.1)
Sodium: 130 mmol/L — ABNORMAL LOW (ref 135–145)

## 2024-10-28 LAB — HEPATIC FUNCTION PANEL
ALT: 53 U/L — ABNORMAL HIGH (ref 0–44)
AST: 36 U/L (ref 15–41)
Albumin: 4 g/dL (ref 3.5–5.0)
Alkaline Phosphatase: 115 U/L (ref 38–126)
Bilirubin, Direct: 0.4 mg/dL — ABNORMAL HIGH (ref 0.0–0.2)
Indirect Bilirubin: 0.5 mg/dL (ref 0.3–0.9)
Total Bilirubin: 0.9 mg/dL (ref 0.0–1.2)
Total Protein: 7.2 g/dL (ref 6.5–8.1)

## 2024-10-28 LAB — GLUCOSE, CAPILLARY
Glucose-Capillary: 171 mg/dL — ABNORMAL HIGH (ref 70–99)
Glucose-Capillary: 133 mg/dL — ABNORMAL HIGH (ref 70–99)

## 2024-10-28 MED ORDER — FUROSEMIDE 40 MG PO TABS: 40.0000 mg | ORAL_TABLET | Freq: Two times a day (BID) | ORAL | Status: DC

## 2024-10-28 MED ORDER — FUROSEMIDE 40 MG PO TABS
40.0000 mg | ORAL_TABLET | Freq: Two times a day (BID) | ORAL | 0 refills | Status: DC
  Filled 2024-10-28: qty 60, 30d supply, fill #0

## 2024-10-28 MED ORDER — PREDNISONE 20 MG PO TABS
40.0000 mg | ORAL_TABLET | Freq: Every day | ORAL | 0 refills | Status: AC
  Filled 2024-10-28: qty 10, 5d supply, fill #0

## 2024-10-28 MED ORDER — DOXYCYCLINE HYCLATE 100 MG PO TABS
100.0000 mg | ORAL_TABLET | Freq: Two times a day (BID) | ORAL | 0 refills | Status: AC
  Filled 2024-10-28: qty 10, 5d supply, fill #0

## 2024-10-28 NOTE — Discharge Summary (Signed)
 Physician Discharge Summary  Marcus Tran FMW:[id number] DOB: 04-05-1951 DOA: 10/26/2024  PCP: [name], NP  Admit date: 10/26/2024 Discharge date: 10/28/2024 Recommendations for Outpatient Follow-up:  Follow up with PCP in 1 weeks-call for appointment Please obtain BMP/CBC in one week  Discharge Dispo: Home Discharge Condition: Stable Code Status:   Code Status: Full Code Diet recommendation:  Diet Order             Diet Heart Room service appropriate? Yes; Fluid consistency: Thin  Diet effective now                    Brief/Interim Summary: Marcus Tran is a 74 y.o. male with PMH of  of COPD, diabetes, hypertension current tobacco abuse, chronic systolic CHF, on Acoramidis  for cardiomyopathy, who was in his usual state of health till a few days ago when he started developing shortness of breath, in the setting of sick contacts with grand child with respiratory illness recently and having cough with yellowish expectoration and has been wheezing as well. His saturations at home were 88%. EMS-gave nebulizer treatment and Lasix  following which he started feeling better.He continues to smoke cigarettes. In the ED: Hypoxic-80% on room air vitals with hypertension-labs showed elevated creatinine consistent with CKD, mild transaminitis, abnormal LFTsElevated BNP,mldly elevated troponin level but falt CXR>>abnormal suggesting pulmonary edema versus infiltrates. Patient diuresed IV Lasix  with improvement in volume status-echo updated EF 40-45%, G1 DD with regional WMA mildly reduced RV SF size normal, overall similar ejection fraction in similar wall motion abnormalities Transitioned to oral diuretics and GDMT adjusted- Once able to wean off oxygen,plan to discharge home. patient was able to ambulate 534ft on room air, sat 93%.At rest he is 95%.   Subjective: Seen and examined today Overall feels much improved.  Weaning off oxygen Remains afebrile BP stable still on nasal cannula Labs  reviewed hyponatremia creatinine slightly up 1.9  Discharge Diagnoses:   Acute hypoxic respiratory Acute COPD exacerbation Tobacco abuse: Sick contact with respiratory illness, and having cough shortness of breath wheezing, hypoxia chest x-ray shows edema versus infiltrate, bnp elevated, with history of cardiac, liver disease/cardiomyopathy/chronic CHF.  Low suspicion for VTE. Cont to wean o2 No leukocytosis fever or obvious evidence of pneumonia, influenza COVID, RVP panel negative Cont lasix , steroid bronchodilators as below. Cont smoking cessation   Acute on chronic systolic CHF Cardiac amyloidosis/cardiomyopathy EF Elevated but flat troponin likely demand ischemia: Last EF is 40% in 2024 with moderate concentric LVH g1dd,RV function was mildly reduced.No significant valvular abnormalities noted.   Repeat echo no significant changes, cardiology adjusted GDMT along with oral diuretics at this time stable for discharge home Managed with IV Lasix  transitioned to p.o-continue Lasix  40 twice daily. Cont home BiDil , bisoprolol ,farxiga .  Cardiology consulted and at this time signed off.   Cont to monitor daily I/O,weight, electrolytes and net balance as below.Keep on  salt/fluid restricted diet and monitor in tele. Net IO Since Admission: -1,110 mL [10/28/24 1151]  Filed Weights   10/26/24 1900 10/27/24 0421 10/28/24 0452  Weight: 63.2 kg 62.7 kg 63.6 kg   Recent Labs  Lab 10/26/24 1556 10/27/24 0409 10/27/24 1950 10/28/24 0403  PROBNP 6,268.0* 3,938.0*  --   --   BUN 32* 27*  --  36*  CREATININE 1.93* 1.70*  --  1.99*  K 3.7 3.8  --  4.3  MG  --   --  2.0  --     Type 2 diabetes: Stable.  Hold metformin   cont sliding scale insulin .  Last A1c 6.2 in 2024.  Given elevated CKD hold off on resuming metformin  for now- fu w/w PCP, A1c is controlled at 5.8 will encourage diet control, continue Farxiga . Recent Labs  Lab 10/27/24 0956 10/27/24 1145 10/27/24 1553 10/27/24 2104  10/28/24 0736  GLUCAP  --  134* 209* 186* 171*  HGBA1C 5.8*  --   --   --   --     Essential hypertension: Poorly controlled on admission.  Currently stable on GDMT diuretics as above with-BiDil , bisoprolol    CKD 5: Baseline ~1.8-2.8. Avoid nephrotoxic agents.  Monitor renal function and electrolytes closely  Recent Labs    01/03/24 1424 01/11/24 1154 06/14/24 1114 09/08/24 1123 09/22/24 1104 10/09/24 1003 10/26/24 1556 10/27/24 0409 10/28/24 0403  BUN 34* 23 37* 46* 27* 29* 32* 27* 36*  CREATININE 2.24* 1.85* 1.95* 2.88* 2.25* 2.07* 1.93* 1.70* 1.99*  CO2 20* 24 21* 21* 27 23 26 25 26   K 4.9 4.4 5.3* 4.2 5.2* 4.1 3.7 3.8 4.3    Coronary artery disease Hyperlipidemia Nonobstructive based on cardiac catheterization in 2023. Continue aspirin , resume statin.  Mildly abnormal LFTs Etiology unclear.  Slightly abnormal likely from CHF.   DVT prophylaxis: heparin  injection 5,000 Units Start: 10/26/24 2200 Code Status:   Code Status: Full Code Family Communication: plan of care discussed with patient at bedside. Patient status is: Remains hospitalized because of severity of illness Level of care: Telemetry   Dispo: The patient is from: HOME            Anticipated disposition: home today Objective: Vitals last 24 hrs: Vitals:   10/27/24 2100 10/28/24 0452 10/28/24 0954 10/28/24 0958  BP: (!) 141/95 125/87    Pulse: 84 67    Resp: (!) 21 20    Temp: 98.3 F (36.8 C) 97.8 F (36.6 C)    TempSrc: Oral Oral    SpO2: 100% 100% 94% 94%  Weight:  63.6 kg    Height:        Physical Examination: General exam: AAOX3 HEENT:Oral mucosa moist, Ear/Nose WNL grossly Respiratory system: Bilaterally clear breath sounds, occasional coughing and wheezing Cardiovascular system: S1 & S2 +, No JVD. Gastrointestinal system: Abdomen soft,NT,ND, BS+ Nervous System: Alert, awake, moving all extremities,and following commands. Extremities: extremities warm, leg edema neg Skin: Warm, no  rashes MSK: Normal muscle bulk,tone, power   Medications reviewed:  Scheduled Meds:  Acoramidis  (712MG  Twice Daily)  712 mg Oral BID   arformoterol   15 mcg Nebulization BID   aspirin  EC  81 mg Oral Daily   atorvastatin   80 mg Oral Daily   bisoprolol   5 mg Oral Daily   budesonide  (PULMICORT ) nebulizer solution  0.25 mg Nebulization BID   dapagliflozin  propanediol  10 mg Oral Daily   doxycycline   100 mg Oral Q12H   furosemide   40 mg Oral BID   heparin   5,000 Units Subcutaneous Q8H   insulin  aspart  0-9 Units Subcutaneous TID WC   isosorbide -hydrALAZINE   0.5 tablet Oral TID   montelukast   10 mg Oral QHS   nicotine   14 mg Transdermal Daily   predniSONE   40 mg Oral Q breakfast   revefenacin   175 mcg Nebulization Daily   sodium chloride  flush  3 mL Intravenous Q12H   Continuous Infusions:   Diet: Diet Order             Diet Heart Room service appropriate? Yes; Fluid consistency: Thin  Diet effective now  Consultation: See note.  Discharge Instructions  Discharge Instructions     (HEART FAILURE PATIENTS) Call MD:  Anytime you have any of the following symptoms: 1) 3 pound weight gain in 24 hours or 5 pounds in 1 week 2) shortness of breath, with or without a dry hacking cough 3) swelling in the hands, feet or stomach 4) if you have to sleep on extra pillows at night in order to breathe.   Complete by: As directed    Ambulatory Referral for Lung Cancer Scre   Complete by: As directed    Discharge instructions   Complete by: As directed    Please call call MD or return to ER for similar or worsening recurring problem that brought you to hospital or if any fever,nausea/vomiting,abdominal pain, uncontrolled pain, chest pain,  shortness of breath or any other alarming symptoms.  Please follow-up your doctor as instructed in a week time and call the office for appointment.  Please avoid alcohol, smoking, or any other illicit substance and maintain  healthy habits including taking your regular medications as prescribed.  You were cared for by a hospitalist during your hospital stay. If you have any questions about your discharge medications or the care you received while you were in the hospital after you are discharged, you can call the unit and ask to speak with the hospitalist on call if the hospitalist that took care of you is not available.  Once you are discharged, your primary care physician will handle any further medical issues. Please note that NO REFILLS for any discharge medications will be authorized once you are discharged, as it is imperative that you return to your primary care physician (or establish a relationship with a primary care physician if you do not have one) for your aftercare needs so that they can reassess your need for medications and monitor your lab values   Increase activity slowly   Complete by: As directed       Allergies as of 10/28/2024       Reactions   Vyndamax  [tafamidis ] Dermatitis, Rash        Medication List     PAUSE taking these medications    metFORMIN  500 MG tablet Wait to take this until your doctor or other care provider tells you to start again. Commonly known as: GLUCOPHAGE  Take 1 tablet (500 mg total) by mouth 2 (two) times daily with a meal.       TAKE these medications    aspirin  EC 81 MG tablet Take 1 tablet (81 mg total) by mouth daily. Swallow whole.   atorvastatin  80 MG tablet Commonly known as: LIPITOR  TAKE 1 Tablet BY MOUTH ONCE DAILY EVERY EVENING   Attruby  356 MG Tbpk Generic drug: Acoramidis  (712MG  Twice Daily) Take 712 mg by mouth 2 (two) times daily.   bisoprolol  5 MG tablet Commonly known as: ZEBETA  Take 1 Tablet by mouth once daily (morning)   dapagliflozin  propanediol 10 MG Tabs tablet Commonly known as: FARXIGA  TAKE 1 Tablet BY MOUTH ONCE DAILY (MORNING)   doxycycline  100 MG tablet Commonly known as: VIBRA -TABS Take 1 tablet (100 mg total)  by mouth every 12 (twelve) hours for 5 days.   furosemide  40 MG tablet Commonly known as: LASIX  Take 1 tablet (40 mg total) by mouth 2 (two) times daily. What changed:  medication strength how much to take when to take this   ipratropium-albuterol  0.5-2.5 (3) MG/3ML Soln Commonly known as: DUONEB Take 3 mLs by nebulization every  6 (six) hours as needed (wheezing, shortness of breath).   isosorbide -hydrALAZINE  20-37.5 MG tablet Commonly known as: BIDIL  Take 0.5 tablets by mouth 3 (three) times daily.   montelukast  10 MG tablet Commonly known as: SINGULAIR  Take 1 tablet (10 mg total) by mouth at bedtime.   predniSONE  20 MG tablet Commonly known as: DELTASONE  Take 2 tablets (40 mg total) by mouth daily with breakfast for 5 days. Start taking on: October 29, 2024   Trelegy Ellipta 100-62.5-25 MCG/ACT Aepb Generic drug: Fluticasone -Umeclidin-Vilant Take 1 puff by mouth daily.        Follow-up Information     [name], NP Follow up in 1 week(s).   Contact information: [address] [place] [address] [phone]                Allergies[1]  The results of significant diagnostics from this hospitalization (including imaging, microbiology, ancillary and laboratory) are listed below for reference.    Microbiology: Recent Results (from the past 240 hours)  Resp panel by RT-PCR (RSV, Flu A&B, Covid) Anterior Nasal Swab     Status: None   Collection Time: 10/26/24  5:50 PM   Specimen: Anterior Nasal Swab  Result Value Ref Range Status   SARS Coronavirus 2 by RT PCR NEGATIVE NEGATIVE Final    Comment: (NOTE) SARS-CoV-2 target nucleic acids are NOT DETECTED.  The SARS-CoV-2 RNA is generally detectable in upper respiratory specimens during the acute phase of infection. The lowest concentration of SARS-CoV-2 viral copies this assay can detect is 138 copies/mL. A negative result does not preclude SARS-Cov-2 infection and should not be used as the sole  basis for treatment or other patient management decisions. A negative result may occur with  improper specimen collection/handling, submission of specimen other than nasopharyngeal swab, presence of viral mutation(s) within the areas targeted by this assay, and inadequate number of viral copies(<138 copies/mL). A negative result must be combined with clinical observations, patient history, and epidemiological information. The expected result is Negative.  Fact Sheet for Patients:  bloggercourse.com  Fact Sheet for Healthcare Providers:  seriousbroker.it  This test is no t yet approved or cleared by the [place]  FDA and  has been authorized for detection and/or diagnosis of SARS-CoV-2 by FDA under an Emergency Use Authorization (EUA). This EUA will remain  in effect (meaning this test can be used) for the duration of the COVID-19 declaration under Section 564(b)(1) of the Act, 21 U.S.C.section 360bbb-3(b)(1), unless the authorization is terminated  or revoked sooner.       Influenza A by PCR NEGATIVE NEGATIVE Final   Influenza B by PCR NEGATIVE NEGATIVE Final    Comment: (NOTE) The Xpert Xpress SARS-CoV-2/FLU/RSV plus assay is intended as an aid in the diagnosis of influenza from Nasopharyngeal swab specimens and should not be used as a sole basis for treatment. Nasal washings and aspirates are unacceptable for Xpert Xpress SARS-CoV-2/FLU/RSV testing.  Fact Sheet for Patients: bloggercourse.com  Fact Sheet for Healthcare Providers: seriousbroker.it  This test is not yet approved or cleared by the [place]  FDA and has been authorized for detection and/or diagnosis of SARS-CoV-2 by FDA under an Emergency Use Authorization (EUA). This EUA will remain in effect (meaning this test can be used) for the duration of the COVID-19 declaration under Section 564(b)(1) of the Act,  21 U.S.C. section 360bbb-3(b)(1), unless the authorization is terminated or revoked.     Resp Syncytial Virus by PCR NEGATIVE NEGATIVE Final    Comment: (NOTE) Fact  Sheet for Patients: bloggercourse.com  Fact Sheet for Healthcare Providers: seriousbroker.it  This test is not yet approved or cleared by the [place]  FDA and has been authorized for detection and/or diagnosis of SARS-CoV-2 by FDA under an Emergency Use Authorization (EUA). This EUA will remain in effect (meaning this test can be used) for the duration of the COVID-19 declaration under Section 564(b)(1) of the Act, 21 U.S.C. section 360bbb-3(b)(1), unless the authorization is terminated or revoked.  Performed at [facility], [address] [place], [place] [address]   Respiratory (~20 pathogens) panel by PCR     Status: None   Collection Time: 10/27/24 10:05 AM   Specimen: Nasopharyngeal Swab; Respiratory  Result Value Ref Range Status   Adenovirus NOT DETECTED NOT DETECTED Final   Coronavirus 229E NOT DETECTED NOT DETECTED Final    Comment: (NOTE) The Coronavirus on the Respiratory Panel, DOES NOT test for the novel  Coronavirus (2019 nCoV)    Coronavirus HKU1 NOT DETECTED NOT DETECTED Final   Coronavirus NL63 NOT DETECTED NOT DETECTED Final   Coronavirus OC43 NOT DETECTED NOT DETECTED Final   Metapneumovirus NOT DETECTED NOT DETECTED Final   Rhinovirus / Enterovirus NOT DETECTED NOT DETECTED Final   Influenza A NOT DETECTED NOT DETECTED Final   Influenza B NOT DETECTED NOT DETECTED Final   Parainfluenza Virus 1 NOT DETECTED NOT DETECTED Final   Parainfluenza Virus 2 NOT DETECTED NOT DETECTED Final   Parainfluenza Virus 3 NOT DETECTED NOT DETECTED Final   Parainfluenza Virus 4 NOT DETECTED NOT DETECTED Final   Respiratory Syncytial Virus NOT DETECTED NOT DETECTED Final   Bordetella pertussis NOT DETECTED NOT DETECTED Final    Bordetella Parapertussis NOT DETECTED NOT DETECTED Final   Chlamydophila pneumoniae NOT DETECTED NOT DETECTED Final   Mycoplasma pneumoniae NOT DETECTED NOT DETECTED Final    Comment: Performed at [facility], [address] [place], [place] [address]    Procedures/Studies: ECHOCARDIOGRAM COMPLETE Result Date: 10/27/2024    ECHOCARDIOGRAM REPORT   Patient Name:   Marcus Tran Date of Exam: 10/27/2024 Medical Rec #:  [id number]    Height:       65.0 in Accession #:    [id number]   Weight:       138.2 lb Date of Birth:  [birth year]    BSA:          1.691 m Patient Age:    73 years     BP:           145/99 mmHg Patient Gender: M            HR:           67 bpm. Exam Location:  Inpatient Procedure: 2D Echo, Cardiac Doppler, Color Doppler and Intracardiac            Opacification Agent (Both Spectral and Color Flow Doppler were            utilized during procedure). Indications:    CHF I50.21  History:        Patient has prior history of Echocardiogram examinations, most                 recent 03/06/2022. CHF; Risk Factors:Hypertension and                 Dyslipidemia.  Sonographer:    Tinnie Gosling RDCS Referring Phys: [id number] JOETTE PEBBLES IMPRESSIONS  1. Left ventricular ejection fraction, by estimation, is 40 to 45%. The  left ventricle has mildly decreased function. The left ventricle demonstrates regional wall motion abnormalities (see scoring diagram/findings for description). The left ventricular  internal cavity size was moderately dilated. Left ventricular diastolic parameters are consistent with Grade I diastolic dysfunction (impaired relaxation).  2. Right ventricular systolic function is mildly reduced. The right ventricular size is normal. There is mildly elevated pulmonary artery systolic pressure.  3. Left atrial size was mildly dilated.  4. Right atrial size was mildly dilated.  5. The mitral valve is degenerative. Trivial mitral valve regurgitation. No evidence of mitral stenosis.  6. The  tricuspid valve is degenerative.  7. The aortic valve is tricuspid. Aortic valve regurgitation is trivial. Aortic valve sclerosis/calcification is present, without any evidence of aortic stenosis.  8. Aortic dilatation noted. There is borderline dilatation of the ascending aorta, measuring 37 mm.  9. The inferior vena cava is normal in size with greater than 50% respiratory variability, suggesting right atrial pressure of 3 mmHg. Comparison(s): A prior study was performed on 08/13/23. Similar ejection fraction, previously reported global hypokinesis however on side by side comparison, there were similar wall motion abnormalities previously. No other significant changes. FINDINGS  Left Ventricle: Visually moderate LVH. Left ventricular ejection fraction, by estimation, is 40 to 45%. The left ventricle has mildly decreased function. The left ventricle demonstrates regional wall motion abnormalities. Definity  contrast agent was given IV to delineate the left ventricular endocardial borders. The left ventricular internal cavity size was moderately dilated. There is no left ventricular hypertrophy. Left ventricular diastolic parameters are consistent with Grade I diastolic dysfunction (impaired relaxation).  LV Wall Scoring: The entire inferior wall, basal anteroseptal segment, mid inferoseptal segment, and basal inferoseptal segment are akinetic. Right Ventricle: The right ventricular size is normal. No increase in right ventricular wall thickness. Right ventricular systolic function is mildly reduced. There is mildly elevated pulmonary artery systolic pressure. The tricuspid regurgitant velocity  is 3.05 m/s, and with an assumed right atrial pressure of 3 mmHg, the estimated right ventricular systolic pressure is 40.2 mmHg. Left Atrium: Left atrial size was mildly dilated. Right Atrium: Right atrial size was mildly dilated. Pericardium: There is no evidence of pericardial effusion. Mitral Valve: The mitral valve is  degenerative in appearance. Mild mitral annular calcification. Trivial mitral valve regurgitation. No evidence of mitral valve stenosis. Tricuspid Valve: The tricuspid valve is degenerative in appearance. Tricuspid valve regurgitation is mild . No evidence of tricuspid stenosis. Aortic Valve: The aortic valve is tricuspid. Aortic valve regurgitation is trivial. Aortic valve sclerosis/calcification is present, without any evidence of aortic stenosis. Pulmonic Valve: The pulmonic valve was normal in structure. Pulmonic valve regurgitation is not visualized. No evidence of pulmonic stenosis. Aorta: The aortic root is normal in size and structure and aortic dilatation noted. There is borderline dilatation of the ascending aorta, measuring 37 mm. Venous: The inferior vena cava is normal in size with greater than 50% respiratory variability, suggesting right atrial pressure of 3 mmHg. IAS/Shunts: No atrial level shunt detected by color flow Doppler.  LEFT VENTRICLE PLAX 2D LVIDd:         5.40 cm      Diastology LVIDs:         4.50 cm      LV e' medial:    3.48 cm/s LV PW:         0.80 cm      LV E/e' medial:  17.5 LV IVS:        0.70 cm  LV e' lateral:   5.33 cm/s LVOT diam:     1.90 cm      LV E/e' lateral: 11.4 LV SV:         39 LV SV Index:   23 LVOT Area:     2.84 cm LV IVRT:       127 msec  LV Volumes (MOD) LV vol d, MOD A2C: 112.0 ml LV vol d, MOD A4C: 122.0 ml LV vol s, MOD A2C: 44.7 ml LV vol s, MOD A4C: 39.1 ml LV SV MOD A2C:     67.3 ml LV SV MOD A4C:     122.0 ml LV SV MOD BP:      76.7 ml RIGHT VENTRICLE            IVC RV S prime:     8.27 cm/s  IVC diam: 1.40 cm TAPSE (M-mode): 2.3 cm LEFT ATRIUM             Index        RIGHT ATRIUM           Index LA diam:        3.30 cm 1.95 cm/m   RA Area:     15.40 cm LA Vol (A2C):   41.0 ml 24.25 ml/m  RA Volume:   41.60 ml  24.61 ml/m LA Vol (A4C):   74.3 ml 43.95 ml/m LA Biplane Vol: 55.6 ml 32.89 ml/m  AORTIC VALVE             PULMONIC VALVE LVOT Vmax:    74.40 cm/s  PV Vmax:       3.05 m/s LVOT Vmean:  47.400 cm/s PV Peak grad:  37.2 mmHg LVOT VTI:    0.139 m  AORTA Ao Root diam: 3.00 cm Ao Asc diam:  3.70 cm MITRAL VALVE               TRICUSPID VALVE MV Area (PHT): 3.49 cm    TR Peak grad:   37.2 mmHg MV E velocity: 60.80 cm/s  TR Vmax:        305.00 cm/s MV A velocity: 77.60 cm/s MV E/A ratio:  0.78        SHUNTS                            Systemic VTI:  0.14 m                            Systemic Diam: 1.90 cm Emeline Calender Electronically signed by Emeline Calender Signature Date/Time: 10/27/2024/11:22:59 AM    Final    DG Chest Portable 1 View Result Date: 10/26/2024 CLINICAL DATA:  Difficulty breathing. EXAM: PORTABLE CHEST 1 VIEW COMPARISON:  September 14, 2023 FINDINGS: The heart size and mediastinal contours are within normal limits. Very mildly increased interstitial lung markings are seen mild areas of atelectasis and/or infiltrate are noted within the bilateral lung bases. No pleural effusion or pneumothorax is identified. The visualized skeletal structures are unremarkable. IMPRESSION: 1. Mild bibasilar atelectasis and/or infiltrate. 2. Mildly increased interstitial lung markings which may be, in part, chronic in nature. Mild interstitial edema cannot be excluded. Electronically Signed   By: Suzen Dials M.D.   On: 10/26/2024 15:10    Labs: BNP (last 3 results) Recent Labs    01/03/24 1424 06/14/24 1033  BNP 70.7 303.4*   Basic Metabolic Panel: Recent Labs  Lab 10/26/24 1556 10/27/24 0409 10/27/24 1950 10/28/24 0403  NA 136 138  --  130*  K 3.7 3.8  --  4.3  CL 99 98  --  91*  CO2 26 25  --  26  GLUCOSE 113* 156*  --  153*  BUN 32* 27*  --  36*  CREATININE 1.93* 1.70*  --  1.99*  CALCIUM  9.0 9.2  --  9.1  MG  --   --  2.0  --    Liver Function Tests: Recent Labs  Lab 10/26/24 1556 10/27/24 0409 10/28/24 0403  AST 70* 56* 36  ALT 67* 66* 53*  ALKPHOS 141* 118 115  BILITOT 1.4* 1.1 0.9  PROT 7.2 7.0 7.2  ALBUMIN 4.0 3.9  4.0   No results for input(s): LIPASE, AMYLASE in the last 168 hours. No results for input(s): AMMONIA in the last 168 hours. CBC: Recent Labs  Lab 10/26/24 1556  WBC 4.5  NEUTROABS 3.0  HGB 12.6*  HCT 38.0*  MCV 91.1  PLT 210   CBG: Recent Labs  Lab 10/27/24 1145 10/27/24 1553 10/27/24 2104 10/28/24 0736  GLUCAP 134* 209* 186* 171*   Hgb A1c Recent Labs    10/27/24 0956  HGBA1C 5.8*  No results for input(s): TSH, T4TOTAL, T3FREE, THYROIDAB in the last 72 hours.  Invalid input(s): FREET3 Urinalysis    Component Value Date/Time   COLORURINE STRAW (A) 03/21/2022 1545   APPEARANCEUR CLEAR 03/21/2022 1545   LABSPEC 1.005 03/21/2022 1545   PHURINE 5.0 03/21/2022 1545   GLUCOSEU >=500 (A) 03/21/2022 1545   HGBUR NEGATIVE 03/21/2022 1545   BILIRUBINUR NEGATIVE 03/21/2022 1545   KETONESUR NEGATIVE 03/21/2022 1545   PROTEINUR NEGATIVE 03/21/2022 1545   NITRITE NEGATIVE 03/21/2022 1545   LEUKOCYTESUR NEGATIVE 03/21/2022 1545   Sepsis Labs Recent Labs  Lab 10/26/24 1556  WBC 4.5   Microbiology Recent Results (from the past 240 hours)  Resp panel by RT-PCR (RSV, Flu A&B, Covid) Anterior Nasal Swab     Status: None   Collection Time: 10/26/24  5:50 PM   Specimen: Anterior Nasal Swab  Result Value Ref Range Status   SARS Coronavirus 2 by RT PCR NEGATIVE NEGATIVE Final    Comment: (NOTE) SARS-CoV-2 target nucleic acids are NOT DETECTED.  The SARS-CoV-2 RNA is generally detectable in upper respiratory specimens during the acute phase of infection. The lowest concentration of SARS-CoV-2 viral copies this assay can detect is 138 copies/mL. A negative result does not preclude SARS-Cov-2 infection and should not be used as the sole basis for treatment or other patient management decisions. A negative result may occur with  improper specimen collection/handling, submission of specimen other than nasopharyngeal swab, presence of viral mutation(s) within  the areas targeted by this assay, and inadequate number of viral copies(<138 copies/mL). A negative result must be combined with clinical observations, patient history, and epidemiological information. The expected result is Negative.  Fact Sheet for Patients:  bloggercourse.com  Fact Sheet for Healthcare Providers:  seriousbroker.it  This test is no t yet approved or cleared by the [place]  FDA and  has been authorized for detection and/or diagnosis of SARS-CoV-2 by FDA under an Emergency Use Authorization (EUA). This EUA will remain  in effect (meaning this test can be used) for the duration of the COVID-19 declaration under Section 564(b)(1) of the Act, 21 U.S.C.section 360bbb-3(b)(1), unless the authorization is terminated  or revoked sooner.       Influenza A by PCR NEGATIVE NEGATIVE  Final   Influenza B by PCR NEGATIVE NEGATIVE Final    Comment: (NOTE) The Xpert Xpress SARS-CoV-2/FLU/RSV plus assay is intended as an aid in the diagnosis of influenza from Nasopharyngeal swab specimens and should not be used as a sole basis for treatment. Nasal washings and aspirates are unacceptable for Xpert Xpress SARS-CoV-2/FLU/RSV testing.  Fact Sheet for Patients: bloggercourse.com  Fact Sheet for Healthcare Providers: seriousbroker.it  This test is not yet approved or cleared by the [place]  FDA and has been authorized for detection and/or diagnosis of SARS-CoV-2 by FDA under an Emergency Use Authorization (EUA). This EUA will remain in effect (meaning this test can be used) for the duration of the COVID-19 declaration under Section 564(b)(1) of the Act, 21 U.S.C. section 360bbb-3(b)(1), unless the authorization is terminated or revoked.     Resp Syncytial Virus by PCR NEGATIVE NEGATIVE Final    Comment: (NOTE) Fact Sheet for  Patients: bloggercourse.com  Fact Sheet for Healthcare Providers: seriousbroker.it  This test is not yet approved or cleared by the [place]  FDA and has been authorized for detection and/or diagnosis of SARS-CoV-2 by FDA under an Emergency Use Authorization (EUA). This EUA will remain in effect (meaning this test can be used) for the duration of the COVID-19 declaration under Section 564(b)(1) of the Act, 21 U.S.C. section 360bbb-3(b)(1), unless the authorization is terminated or revoked.  Performed at [facility], [address] [place], [place] [address]   Respiratory (~20 pathogens) panel by PCR     Status: None   Collection Time: 10/27/24 10:05 AM   Specimen: Nasopharyngeal Swab; Respiratory  Result Value Ref Range Status   Adenovirus NOT DETECTED NOT DETECTED Final   Coronavirus 229E NOT DETECTED NOT DETECTED Final    Comment: (NOTE) The Coronavirus on the Respiratory Panel, DOES NOT test for the novel  Coronavirus (2019 nCoV)    Coronavirus HKU1 NOT DETECTED NOT DETECTED Final   Coronavirus NL63 NOT DETECTED NOT DETECTED Final   Coronavirus OC43 NOT DETECTED NOT DETECTED Final   Metapneumovirus NOT DETECTED NOT DETECTED Final   Rhinovirus / Enterovirus NOT DETECTED NOT DETECTED Final   Influenza A NOT DETECTED NOT DETECTED Final   Influenza B NOT DETECTED NOT DETECTED Final   Parainfluenza Virus 1 NOT DETECTED NOT DETECTED Final   Parainfluenza Virus 2 NOT DETECTED NOT DETECTED Final   Parainfluenza Virus 3 NOT DETECTED NOT DETECTED Final   Parainfluenza Virus 4 NOT DETECTED NOT DETECTED Final   Respiratory Syncytial Virus NOT DETECTED NOT DETECTED Final   Bordetella pertussis NOT DETECTED NOT DETECTED Final   Bordetella Parapertussis NOT DETECTED NOT DETECTED Final   Chlamydophila pneumoniae NOT DETECTED NOT DETECTED Final   Mycoplasma pneumoniae NOT DETECTED NOT DETECTED Final    Comment:  Performed at [facility], [address] [place], [place] [address]   Time coordinating discharge: 35 minutes  SIGNED: Mennie LAMY, MD  [facility] 10/28/2024, 11:51 AM  If 7PM-7AM, please contact night-coverage [email or web address]       [1]  Allergies Allergen Reactions   Vyndamax  [Tafamidis ] Dermatitis and Rash

## 2024-10-28 NOTE — Progress Notes (Signed)
 Discharge meds in a secure bag delivered to patient by this RN

## 2024-10-28 NOTE — TOC Transition Note (Signed)
 Transition of Care [facility]) - Discharge Note   Patient Details  Name: Marcus Tran MRN: [id number] Date of Birth: 1950/12/31  Transition of [facility]) CM/SW Contact:  Sonda Manuella Quill, RN Phone Number: 10/28/2024, 12:48 PM   Clinical Narrative:    D/C orders received; verified pt will not need home oxygen; notified by Alejandra, RN , and Dr CHRISTOBAL, pt has been weaned off oxygen; no IP CM needs.   Final next level of care: Home/Self Care Barriers to Discharge: No Barriers Identified   Patient Goals and CMS Choice Patient states their goals for this hospitalization and ongoing recovery are:: home     Mora ownership interest in [facility].provided to:: Patient    Discharge Placement                       Discharge Plan and Services Additional resources added to the After Visit Summary for     Discharge Planning Services: CM Consult            DME Arranged: N/A DME Agency: NA       HH Arranged: NA HH Agency: NA        Social Drivers of Health (SDOH) Interventions SDOH Screenings   Food Insecurity: No Food Insecurity (10/27/2024)  Housing: Low Risk (10/27/2024)  Transportation Needs: No Transportation Needs (10/27/2024)  Recent Concern: Transportation Needs - Unmet Transportation Needs (10/26/2024)  Utilities: Not At Risk (10/27/2024)  Alcohol Screen: Low Risk (05/06/2022)  Financial Resource Strain: Low Risk (03/11/2022)  Social Connections: Moderately Isolated (10/26/2024)  Tobacco Use: High Risk (10/27/2024)     Readmission Risk Interventions    10/27/2024   11:48 AM 05/06/2022   12:16 PM  Readmission Risk Prevention Plan  Transportation Screening Complete Complete  PCP or Specialist Appt within 3-5 Days Complete Complete  HRI or Home Care Consult Complete Complete  Social Work Consult for Recovery Care Planning/Counseling Complete Complete  Palliative Care Screening Not Applicable Not Applicable  Medication Review Oceanographer) Complete  Complete

## 2024-10-28 NOTE — Progress Notes (Signed)
 "  Cardiologist:  Hochrein/McLean  Subjective:  Dyspnea much better needs to ambulate in hall   Objective:  Vitals:   10/27/24 2100 10/28/24 0452 10/28/24 0954 10/28/24 0958  BP: (!) 141/95 125/87    Pulse: 84 67    Resp: (!) 21 20    Temp: 98.3 F (36.8 C) 97.8 F (36.6 C)    TempSrc: Oral Oral    SpO2: 100% 100% 94% 94%  Weight:  63.6 kg    Height:        Intake/Output from previous day:  Intake/Output Summary (Last 24 hours) at 10/28/2024 1012 Last data filed at 10/28/2024 0900 Gross per 24 hour  Intake 1200 ml  Output 200 ml  Net 1000 ml    Physical Exam:  Black male in no distress JVP mildly elevated PMI increased SEM Abdomen benign No edema Lungs clear   Lab Results: Basic Metabolic Panel: Recent Labs    10/27/24 0409 10/27/24 1950 10/28/24 0403  NA 138  --  130*  K 3.8  --  4.3  CL 98  --  91*  CO2 25  --  26  GLUCOSE 156*  --  153*  BUN 27*  --  36*  CREATININE 1.70*  --  1.99*  CALCIUM  9.2  --  9.1  MG  --  2.0  --    Liver Function Tests: Recent Labs    10/27/24 0409 10/28/24 0403  AST 56* 36  ALT 66* 53*  ALKPHOS 118 115  BILITOT 1.1 0.9  PROT 7.0 7.2  ALBUMIN 3.9 4.0   No results for input(s): LIPASE, AMYLASE in the last 72 hours. CBC: Recent Labs    10/26/24 1556  WBC 4.5  NEUTROABS 3.0  HGB 12.6*  HCT 38.0*  MCV 91.1  PLT 210    Hemoglobin A1C: Recent Labs    10/27/24 0956  HGBA1C 5.8*    Imaging: ECHOCARDIOGRAM COMPLETE Result Date: 10/27/2024    ECHOCARDIOGRAM REPORT   Patient Name:   Marcus Tran Date of Exam: 10/27/2024 Medical Rec #:  [id number]    Height:       65.0 in Accession #:    [id number]   Weight:       138.2 lb Date of Birth:  May 13, 1951    BSA:          1.691 m Patient Age:    73 years     BP:           145/99 mmHg Patient Gender: M            HR:           67 bpm. Exam Location:  Inpatient Procedure: 2D Echo, Cardiac Doppler, Color Doppler and Intracardiac            Opacification Agent (Both  Spectral and Color Flow Doppler were            utilized during procedure). Indications:    CHF I50.21  History:        Patient has prior history of Echocardiogram examinations, most                 recent 03/06/2022. CHF; Risk Factors:Hypertension and                 Dyslipidemia.  Sonographer:    Tinnie Gosling RDCS Referring Phys: [id number] JOETTE PEBBLES IMPRESSIONS  1. Left ventricular ejection fraction, by estimation, is 40 to 45%. The left ventricle has mildly decreased function. The  left ventricle demonstrates regional wall motion abnormalities (see scoring diagram/findings for description). The left ventricular  internal cavity size was moderately dilated. Left ventricular diastolic parameters are consistent with Grade I diastolic dysfunction (impaired relaxation).  2. Right ventricular systolic function is mildly reduced. The right ventricular size is normal. There is mildly elevated pulmonary artery systolic pressure.  3. Left atrial size was mildly dilated.  4. Right atrial size was mildly dilated.  5. The mitral valve is degenerative. Trivial mitral valve regurgitation. No evidence of mitral stenosis.  6. The tricuspid valve is degenerative.  7. The aortic valve is tricuspid. Aortic valve regurgitation is trivial. Aortic valve sclerosis/calcification is present, without any evidence of aortic stenosis.  8. Aortic dilatation noted. There is borderline dilatation of the ascending aorta, measuring 37 mm.  9. The inferior vena cava is normal in size with greater than 50% respiratory variability, suggesting right atrial pressure of 3 mmHg. Comparison(s): A prior study was performed on 08/13/23. Similar ejection fraction, previously reported global hypokinesis however on side by side comparison, there were similar wall motion abnormalities previously. No other significant changes. FINDINGS  Left Ventricle: Visually moderate LVH. Left ventricular ejection fraction, by estimation, is 40 to 45%. The left ventricle has  mildly decreased function. The left ventricle demonstrates regional wall motion abnormalities. Definity  contrast agent was given IV to delineate the left ventricular endocardial borders. The left ventricular internal cavity size was moderately dilated. There is no left ventricular hypertrophy. Left ventricular diastolic parameters are consistent with Grade I diastolic dysfunction (impaired relaxation).  LV Wall Scoring: The entire inferior wall, basal anteroseptal segment, mid inferoseptal segment, and basal inferoseptal segment are akinetic. Right Ventricle: The right ventricular size is normal. No increase in right ventricular wall thickness. Right ventricular systolic function is mildly reduced. There is mildly elevated pulmonary artery systolic pressure. The tricuspid regurgitant velocity  is 3.05 m/s, and with an assumed right atrial pressure of 3 mmHg, the estimated right ventricular systolic pressure is 40.2 mmHg. Left Atrium: Left atrial size was mildly dilated. Right Atrium: Right atrial size was mildly dilated. Pericardium: There is no evidence of pericardial effusion. Mitral Valve: The mitral valve is degenerative in appearance. Mild mitral annular calcification. Trivial mitral valve regurgitation. No evidence of mitral valve stenosis. Tricuspid Valve: The tricuspid valve is degenerative in appearance. Tricuspid valve regurgitation is mild . No evidence of tricuspid stenosis. Aortic Valve: The aortic valve is tricuspid. Aortic valve regurgitation is trivial. Aortic valve sclerosis/calcification is present, without any evidence of aortic stenosis. Pulmonic Valve: The pulmonic valve was normal in structure. Pulmonic valve regurgitation is not visualized. No evidence of pulmonic stenosis. Aorta: The aortic root is normal in size and structure and aortic dilatation noted. There is borderline dilatation of the ascending aorta, measuring 37 mm. Venous: The inferior vena cava is normal in size with greater than  50% respiratory variability, suggesting right atrial pressure of 3 mmHg. IAS/Shunts: No atrial level shunt detected by color flow Doppler.  LEFT VENTRICLE PLAX 2D LVIDd:         5.40 cm      Diastology LVIDs:         4.50 cm      LV e' medial:    3.48 cm/s LV PW:         0.80 cm      LV E/e' medial:  17.5 LV IVS:        0.70 cm      LV e' lateral:   5.33  cm/s LVOT diam:     1.90 cm      LV E/e' lateral: 11.4 LV SV:         39 LV SV Index:   23 LVOT Area:     2.84 cm LV IVRT:       127 msec  LV Volumes (MOD) LV vol d, MOD A2C: 112.0 ml LV vol d, MOD A4C: 122.0 ml LV vol s, MOD A2C: 44.7 ml LV vol s, MOD A4C: 39.1 ml LV SV MOD A2C:     67.3 ml LV SV MOD A4C:     122.0 ml LV SV MOD BP:      76.7 ml RIGHT VENTRICLE            IVC RV S prime:     8.27 cm/s  IVC diam: 1.40 cm TAPSE (M-mode): 2.3 cm LEFT ATRIUM             Index        RIGHT ATRIUM           Index LA diam:        3.30 cm 1.95 cm/m   RA Area:     15.40 cm LA Vol (A2C):   41.0 ml 24.25 ml/m  RA Volume:   41.60 ml  24.61 ml/m LA Vol (A4C):   74.3 ml 43.95 ml/m LA Biplane Vol: 55.6 ml 32.89 ml/m  AORTIC VALVE             PULMONIC VALVE LVOT Vmax:   74.40 cm/s  PV Vmax:       3.05 m/s LVOT Vmean:  47.400 cm/s PV Peak grad:  37.2 mmHg LVOT VTI:    0.139 m  AORTA Ao Root diam: 3.00 cm Ao Asc diam:  3.70 cm MITRAL VALVE               TRICUSPID VALVE MV Area (PHT): 3.49 cm    TR Peak grad:   37.2 mmHg MV E velocity: 60.80 cm/s  TR Vmax:        305.00 cm/s MV A velocity: 77.60 cm/s MV E/A ratio:  0.78        SHUNTS                            Systemic VTI:  0.14 m                            Systemic Diam: 1.90 cm Marcus Tran Electronically signed by Marcus Tran Signature Date/Time: 10/27/2024/11:22:59 AM    Final    DG Chest Portable 1 View Result Date: 10/26/2024 CLINICAL DATA:  Difficulty breathing. EXAM: PORTABLE CHEST 1 VIEW COMPARISON:  September 14, 2023 FINDINGS: The heart size and mediastinal contours are within normal limits. Very mildly increased  interstitial lung markings are seen mild areas of atelectasis and/or infiltrate are noted within the bilateral lung bases. No pleural effusion or pneumothorax is identified. The visualized skeletal structures are unremarkable. IMPRESSION: 1. Mild bibasilar atelectasis and/or infiltrate. 2. Mildly increased interstitial lung markings which may be, in part, chronic in nature. Mild interstitial edema cannot be excluded. Electronically Signed   By: Suzen Dials M.D.   On: 10/26/2024 15:10    Cardiac Studies:  ECG:    Telemetry:  SR rates 70   Echo: EF 40-45% mildly reduced RV trivial MR  Medications:    Acoramidis  (712MG  Twice Daily)  712 mg Oral BID   arformoterol   15 mcg Nebulization BID   aspirin  EC  81 mg Oral Daily   atorvastatin   80 mg Oral Daily   bisoprolol   5 mg Oral Daily   budesonide  (PULMICORT ) nebulizer solution  0.25 mg Nebulization BID   dapagliflozin  propanediol  10 mg Oral Daily   doxycycline   100 mg Oral Q12H   furosemide   40 mg Intravenous Q12H   heparin   5,000 Units Subcutaneous Q8H   insulin  aspart  0-9 Units Subcutaneous TID WC   isosorbide -hydrALAZINE   0.5 tablet Oral TID   montelukast   10 mg Oral QHS   nicotine   14 mg Transdermal Daily   predniSONE   40 mg Oral Q breakfast   revefenacin   175 mcg Nebulization Daily   sodium chloride  flush  3 mL Intravenous Q12H      Assessment/Plan:   CHF:  ? Related to amyloid. Followed in CHF clinic Weight at baseline change to oral lasix  CRF stable Cr around 1.9.  Allergic dermatitis with Vyndamax  no on Acoramidis . Continue lasix  40 mg PO bid and bisoprolol  5 mg daily. No GLP-1/ARB/ARNI or aldactione with CRF  HTN:  stable on bidil  for this and CHF as well   Ok to d/c home will arrange f/u with CHF clinic Dr Rolan  Cardiology will sign off   Marcus Tran 10/28/2024, 10:12 AM    "

## 2024-10-30 ENCOUNTER — Other Ambulatory Visit (HOSPITAL_COMMUNITY): Payer: Self-pay | Admitting: Emergency Medicine

## 2024-10-30 NOTE — Progress Notes (Signed)
 Today's visit was unscheduled as a follow up for his recent discharge from hospital.  [name] A&O  x 4, skin W&D w/ good color.  Assisted pt w/ med reconciliation x 1 week.  Pt's med changes included:  increased Furosemide  40mg . BID, added 40mg . Prednisone  daily x 5 days and doxycycline  100mg  BID x 5 days.  Metformin  was discontinued pending follow up visit w/ PCP.  Pt also received his Attruby  in the mail this morning and will begin this regiment today.  Will revisit Marcus Tran on Thursday to follow for vitals and med status. I will schedule an appointment with H&V as well as assist him setting up appointment with PCP.  He will need transportation set up for these appointments as well.   Mary Sharps, EMT-Paramedic [phone] 10/30/2024

## 2024-11-08 ENCOUNTER — Telehealth (HOSPITAL_COMMUNITY): Payer: Self-pay

## 2024-11-08 ENCOUNTER — Other Ambulatory Visit (HOSPITAL_COMMUNITY): Payer: Self-pay | Admitting: Emergency Medicine

## 2024-11-08 NOTE — Progress Notes (Signed)
 Paramedicine Encounter    Patient ID: Marcus Tran, male    DOB: 06-23-1951, 74 y.o.   MRN: [id number]   Complaints NONE  Assessment A&O x 4, skin W&D w/ good color.  Lung sounds clear bilat and no peripheral edema noted.  Pt denies chest pain or SOB.  He states he is feeling much better since his recent hospitalization.    Pt advises that he missed 2 days of meds.  He is back on track now and all meds reviewed.  Due to med changes we are currently deconstructing his bubble packs into a pill box.  He does have a HF Clinic appointment tomorrow morning and I will follow up for med changes as needed.  Compliance with meds Missed 2 days  Pill box filled x 1 week  Refills needed NONE at this time.  Meds changes since last visit NONE    Social changes NONE   BP (!) 150/80 (BP Location: Right Arm, Patient Position: Sitting, Cuff Size: Normal)   Pulse 76   Wt 141 lb 3.2 oz (64 kg)   SpO2 99%   BMI 23.50 kg/m   ACTION: Home visit completed  Marcus Tran [phone] 11/08/24  Patient Care Team: Marcus Reynolds, NP as PCP - General Marcus Agent, Tran as PCP - Cardiology (Cardiology) Marcus Tran as PCP - Advanced Heart Failure (Cardiology) Marcus Tran as Consulting Physician (Pulmonary Disease)  Patient Active Problem List   Diagnosis Date Noted   Acute systolic CHF (congestive heart failure) (HCC) 10/26/2024   Cocaine use 01/15/2023   Noncompliance 01/13/2023   Adenovirus pneumonia 11/12/2022   Acute on chronic systolic congestive heart failure (HCC) 08/13/2022   DM2 (diabetes mellitus, type 2) (HCC) 08/13/2022   Obstructive sleep apnea (adult) (pediatric) 08/08/2022   Acute respiratory failure with hypoxia and hypercapnia (HCC) 05/04/2022   Third degree heart block (HCC) 04/15/2022   Snoring 04/15/2022   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   HFrEF (heart failure with reduced ejection fraction) (HCC) 04/15/2022   COPD with acute  exacerbation (HCC) 03/22/2022   Acute respiratory failure with hypoxia (HCC) 03/20/2022   Chronic combined systolic and diastolic CHF (congestive heart failure) (HCC)    Tobacco use disorder 03/08/2022   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   HTN (hypertension) 03/08/2022   Stage 3a chronic kidney disease (CKD) (HCC) 03/08/2022   Coronary artery disease 03/08/2022   Current Medications[1] Allergies[2]   Social History   Socioeconomic History   Marital status: Single    Spouse name: Not on file   Number of children: 1   Years of education: Not on file   Highest education level: 10th grade  Occupational History   Occupation: Disability  Tobacco Use   Smoking status: Some Days    Current packs/day: 0.00    Average packs/day: 0.5 packs/day for 50.0 years (25.0 ttl pk-yrs)    Types: Cigarettes    Start date: 03/19/1972    Last attempt to quit: 03/19/2022    Years since quitting: 2.6   Smokeless tobacco: Never   Tobacco comments:    Smoking cessation  Vaping Use   Vaping status: Never Used  Substance and Sexual Activity   Alcohol use: Not on file    Comment: socially   Drug use: Yes    Types: Marijuana    Comment: past   Sexual activity: Not on file  Other Topics Concern   Not on file  Social History Narrative   He lives  with his daughter apparently and 5 grandchildren.  He reports that he smokes probably less than a pack of cigarettes a day.  He occasionally drinks a beer or liquor but not daily.   Social Drivers of Health   Tobacco Use: High Risk (10/27/2024)   Patient History    Smoking Tobacco Use: Some Days    Smokeless Tobacco Use: Never    Passive Exposure: Not on file  Financial Resource Strain: Low Risk (03/11/2022)   Overall Financial Resource Strain (CARDIA)    Difficulty of Paying Living Expenses: Not very hard  Food Insecurity: No Food Insecurity (10/27/2024)   Epic    Worried About Programme Researcher, Broadcasting/film/video in the Last Year: Never true    Ran Out of  Food in the Last Year: Never true  Transportation Needs: No Transportation Needs (10/27/2024)   Epic    Lack of Transportation (Medical): No    Lack of Transportation (Non-Medical): No  Recent Concern: Transportation Needs - Unmet Transportation Needs (10/26/2024)   Epic    Lack of Transportation (Medical): Yes    Lack of Transportation (Non-Medical): Yes  Physical Activity: Not on file  Stress: Not on file  Social Connections: Moderately Isolated (10/26/2024)   Social Connection and Isolation Panel    Frequency of Communication with Friends and Family: More than three times a week    Frequency of Social Gatherings with Friends and Family: More than three times a week    Attends Religious Services: 1 to 4 times per year    Active Member of [employer] or Organizations: No    Attends Banker Meetings: Never    Marital Status: Never married  Intimate Partner Violence: Not At Risk (10/27/2024)   Epic    Fear of Current or Ex-Partner: No    Emotionally Abused: No    Physically Abused: No    Sexually Abused: No  Depression (PHQ2-9): Not on file  Alcohol Screen: Low Risk (05/06/2022)   Alcohol Screen    Last Alcohol Screening Score (AUDIT): 0  Housing: Low Risk (10/27/2024)   Epic    Unable to Pay for Housing in the Last Year: No    Number of Times Moved in the Last Year: 0    Homeless in the Last Year: No  Utilities: Not At Risk (10/27/2024)   Epic    Threatened with loss of utilities: No  Health Literacy: Not on file    Physical Exam      Future Appointments  Date Time Provider Department Center  11/09/2024 10:30 AM [facility] PA/NP [facility] None  11/15/2024  9:15 AM Marcus Tran [facility] [address]           [1]  Current Outpatient Medications:    Acoramidis , 712MG  Twice Daily, (ATTRUBY ) 356 MG TBPK, Take 712 mg by mouth 2 (two) times daily., Disp: 112 each, Rfl: 11   aspirin  EC 81 MG tablet, Take 1 tablet (81 mg total) by mouth daily. Swallow whole.,  Disp: , Rfl:    atorvastatin  (LIPITOR ) 80 MG tablet, TAKE 1 Tablet BY MOUTH ONCE DAILY EVERY EVENING, Disp: 90 tablet, Rfl: 3   bisoprolol  (ZEBETA ) 5 MG tablet, Take 1 Tablet by mouth once daily (morning), Disp: 90 tablet, Rfl: 3   dapagliflozin  propanediol (FARXIGA ) 10 MG TABS tablet, TAKE 1 Tablet BY MOUTH ONCE DAILY (MORNING), Disp: 30 tablet, Rfl: 11   furosemide  (LASIX ) 40 MG tablet, Take 1 tablet (40 mg total) by mouth 2 (two) times daily.,  Disp: 60 tablet, Rfl: 0   ipratropium-albuterol  (DUONEB) 0.5-2.5 (3) MG/3ML SOLN, Take 3 mLs by nebulization every 6 (six) hours as needed (wheezing, shortness of breath)., Disp: , Rfl:    isosorbide -hydrALAZINE  (BIDIL ) 20-37.5 MG tablet, Take 0.5 tablets by mouth 3 (three) times daily., Disp: 45 tablet, Rfl: 5   TRELEGY ELLIPTA 100-62.5-25 MCG/ACT AEPB, Take 1 puff by mouth daily., Disp: , Rfl:    [Paused] metFORMIN  (GLUCOPHAGE ) 500 MG tablet, Take 1 tablet (500 mg total) by mouth 2 (two) times daily with a meal. (Patient not taking: Reported on 11/08/2024), Disp: 60 tablet, Rfl: 0   montelukast  (SINGULAIR ) 10 MG tablet, Take 1 tablet (10 mg total) by mouth at bedtime. (Patient not taking: Reported on 11/08/2024), Disp: 30 tablet, Rfl: 2 [2]  Allergies Allergen Reactions   Vyndamax  Calypso.[email or web address] ] Dermatitis and Rash

## 2024-11-08 NOTE — Telephone Encounter (Signed)
 Called and spoke to Marcus Tran with Paramedicine  to confirm/remind patient of their appointment at the [facility] on 11/09/24.   Appointment:   [x] Confirmed  [] Left mess   [] No answer/No voice mail  [] VM Full/unable to leave message  [] Phone not in service  Patient reminded to bring all medications and/or complete list.  Confirmed patient has transportation. Gave directions, instructed to utilize valet parking.

## 2024-11-09 ENCOUNTER — Other Ambulatory Visit (HOSPITAL_COMMUNITY): Payer: Self-pay | Admitting: Emergency Medicine

## 2024-11-09 ENCOUNTER — Ambulatory Visit (HOSPITAL_COMMUNITY)
Admission: RE | Admit: 2024-11-09 | Discharge: 2024-11-09 | Disposition: A | Discharge status: Home / Self Care | Source: Ambulatory Visit | Attending: Cardiology | Admitting: Cardiology

## 2024-11-09 ENCOUNTER — Ambulatory Visit (HOSPITAL_COMMUNITY): Payer: Self-pay | Admitting: Cardiology

## 2024-11-09 ENCOUNTER — Encounter (HOSPITAL_COMMUNITY): Payer: Self-pay

## 2024-11-09 VITALS — BP 110/78 | HR 65 | Ht 65.0 in | Wt 147.6 lb

## 2024-11-09 DIAGNOSIS — Z555 Less than a high school diploma: Secondary | ICD-10-CM | POA: Diagnosis not present

## 2024-11-09 DIAGNOSIS — I5022 Chronic systolic (congestive) heart failure: Secondary | ICD-10-CM | POA: Insufficient documentation

## 2024-11-09 DIAGNOSIS — I471 Supraventricular tachycardia, unspecified: Secondary | ICD-10-CM | POA: Diagnosis not present

## 2024-11-09 DIAGNOSIS — Z7984 Long term (current) use of oral hypoglycemic drugs: Secondary | ICD-10-CM | POA: Diagnosis not present

## 2024-11-09 DIAGNOSIS — Z7982 Long term (current) use of aspirin: Secondary | ICD-10-CM | POA: Insufficient documentation

## 2024-11-09 DIAGNOSIS — Z91199 Patient's noncompliance with other medical treatment and regimen due to unspecified reason: Secondary | ICD-10-CM | POA: Diagnosis not present

## 2024-11-09 DIAGNOSIS — Z5982 Transportation insecurity: Secondary | ICD-10-CM | POA: Diagnosis not present

## 2024-11-09 DIAGNOSIS — Z79899 Other long term (current) drug therapy: Secondary | ICD-10-CM | POA: Diagnosis not present

## 2024-11-09 DIAGNOSIS — R21 Rash and other nonspecific skin eruption: Secondary | ICD-10-CM | POA: Diagnosis not present

## 2024-11-09 DIAGNOSIS — I110 Hypertensive heart disease with heart failure: Secondary | ICD-10-CM | POA: Diagnosis present

## 2024-11-09 DIAGNOSIS — I5020 Unspecified systolic (congestive) heart failure: Secondary | ICD-10-CM | POA: Diagnosis not present

## 2024-11-09 DIAGNOSIS — G4733 Obstructive sleep apnea (adult) (pediatric): Secondary | ICD-10-CM | POA: Insufficient documentation

## 2024-11-09 DIAGNOSIS — F17210 Nicotine dependence, cigarettes, uncomplicated: Secondary | ICD-10-CM | POA: Diagnosis not present

## 2024-11-09 DIAGNOSIS — J441 Chronic obstructive pulmonary disease with (acute) exacerbation: Secondary | ICD-10-CM | POA: Diagnosis not present

## 2024-11-09 DIAGNOSIS — I472 Ventricular tachycardia, unspecified: Secondary | ICD-10-CM | POA: Diagnosis not present

## 2024-11-09 DIAGNOSIS — I2510 Atherosclerotic heart disease of native coronary artery without angina pectoris: Secondary | ICD-10-CM | POA: Diagnosis not present

## 2024-11-09 DIAGNOSIS — I428 Other cardiomyopathies: Secondary | ICD-10-CM | POA: Insufficient documentation

## 2024-11-09 DIAGNOSIS — L309 Dermatitis, unspecified: Secondary | ICD-10-CM | POA: Insufficient documentation

## 2024-11-09 LAB — BASIC METABOLIC PANEL WITH GFR
Anion gap: 12 (ref 5–15)
BUN: 31 mg/dL — ABNORMAL HIGH (ref 8–23)
CO2: 25 mmol/L (ref 22–32)
Calcium: 9.1 mg/dL (ref 8.9–10.3)
Chloride: 96 mmol/L — ABNORMAL LOW (ref 98–111)
Creatinine, Ser: 1.84 mg/dL — ABNORMAL HIGH (ref 0.61–1.24)
GFR, Estimated: 38 mL/min — ABNORMAL LOW
Glucose, Bld: 119 mg/dL — ABNORMAL HIGH (ref 70–99)
Potassium: 4.5 mmol/L (ref 3.5–5.1)
Sodium: 133 mmol/L — ABNORMAL LOW (ref 135–145)

## 2024-11-09 MED ORDER — FUROSEMIDE 40 MG PO TABS: 40.0000 mg | ORAL_TABLET | Freq: Two times a day (BID) | ORAL | 5 refills | Status: AC

## 2024-11-09 NOTE — Patient Instructions (Signed)
 Medication Changes:  FUROSEMIDE  REFILLED   Lab Work:  Labs done today, your results will be available in MyChart, we will contact you for abnormal readings.  Follow-Up in: 3 MONTHS AS SCHEDULED WITH DR. ROLAN   At the [facility], you and your health needs are our priority. We have a designated team specialized in the treatment of Heart Failure. This Care Team includes your primary Heart Failure Specialized Cardiologist (physician), Advanced Practice Providers (APPs- Physician Assistants and Nurse Practitioners), and Pharmacist who all work together to provide you with the care you need, when you need it.   You may see any of the following providers on your designated Care Team at your next follow up:  Dr. Toribio Fuel Dr. Ezra Rolan Dr. Odis Brownie Greig Mosses, NP Caffie Shed, [place] [facility] [place], [place] Beckey Coe, NP Jordan Lee, NP Tinnie Redman, PharmD   Please be sure to bring in all your medications bottles to every appointment.   Need to Contact Us :  If you have any questions or concerns before your next appointment please send us  a message through [facility] or call our office at [phone].    TO LEAVE A MESSAGE FOR THE NURSE SELECT OPTION 2, PLEASE LEAVE A MESSAGE INCLUDING: YOUR NAME DATE OF BIRTH CALL BACK NUMBER REASON FOR CALL**this is important as we prioritize the call backs  YOU WILL RECEIVE A CALL BACK THE SAME DAY AS LONG AS YOU CALL BEFORE 4:00 PM

## 2024-11-09 NOTE — Addendum Note (Signed)
 Encounter addended by: Marcine Caffie HERO, PA-C on: 11/09/2024 11:37 AM  Actions taken: Clinical Note Signed

## 2024-11-09 NOTE — Progress Notes (Addendum)
 "  ADVANCED HF CLINIC NOTE  PCP: [name], NP [facility] PCP  Primary Cardiologist: Lynwood Schilling, MD  HF Cardiology: Dr. Rolan   HPI: 74 y.o. [name] with hx tobacco use, COPD, OSA on Bipap, HTN, and chronic HFmrEF.   Admitted 5/23 with acute hypoxic respiratory failure secondary to suspected COPD and acute systolic CHF. Initially in respiratory distress and required BiPAP. BP significantly elevated at 189/133, remained elevated throughout much of admission but improved with titration of GDMT. CTA chest negative for PE. Echo EF 35%, RV okay, RVSP 54 mmHg. R/LHC showed nonobstructive CAD; RA mean 12, PA 54/29 (37), PCWP mean 23, LVEDP 22 mmHg, Fick CO/CI 3.84/2.12. He diuresed with IV lasix .    He was readmitted 6/23 with acute on chronic respiratory failure with hypoxia secondary to acute COPD exacerbation. Also thought to possibly have some component of CHF. He diuresed with IV lasix  and received doxycycline  + prednisone . Discharge weight 151 lb.    Zio monitor in 6/23 showed NSR with short runs NSVT and SVT, bradyarrhythmias, idioventricular rhythm, with episodes of third-degree HB noted during sleep hours.  Bradycardia thought to be due to untreated severe OSA.   Admitted 7/23 with acute respiratory failure with hypoxia. O2 sats 80s. He initially required BiPAP. Hypertensive with BP 210/120 and placed on nitro gtt. He was admitted for AECOPD and CHF. Diuresed with IV lasix . Cardiology consulted and felt presentation more likely consistent with AECOPD rather than CHF. Had runs of NSVT up to 20 beats in duration.   Echo 8/23 EF improved 40-45% with LVH. Recommended PYP/ CMRI  Sleep study 9/23 showed severe OSA with AHI of 42.6/hr. He declined in lab BiPap titration.  ED evaluation 12/23 for syncope. Low suspicion for PE. CXR ok. COVID negative. K 5.5. Suspected vasovagal episode during coughing fit.    Spironolactone /KCL stopped with recent hyperkalemia.   Admitted 1/24 for AECOPD.  Transiently required BiPap, then able to wean to nasal cannula. Found to have AKI and GDMT initially held then restarted per home regimen. Started on prednisone  taper. He was discharged home, weight 155 lbs.  Admitted 3/24 with COPD exacerbation. Required BiPap and steroids. Started on IV Solu-Medrol . UDS + cocaine. Spiro and Entresto  stopped with AKI.    Had PYP scan 6/24 --> Stongly suggestive of TTR. He was started on tafamidis  but developed severe rash/dermatitis after starting that improved when he stopped and came back when he restarted. He is now off tafamidis .   Echo 10/24 showed EF 40% with moderate LVH, global hypokinesis, mild RV enlargement with mildly decreased systolic function, IVC normal.   Seen in UC s/p fall 09/14/23. Underwent R distal thumb reduction.  cMRI 12/24 LVEF 40%, RVEF 46%, elevated ECV consistent with cardiac amyloidosis however LGE pattern not classic, consider cardiac sarcoidosis, extensive mid-wall LGE in the basal to mid septum, subepicardial LGE in the basal to mid inferior wall, and patchy mid-wall LGE in the basal to mid anterior wall.  PET scan 9/25 not consistent with active sarcoidosis.  Recent admit 1/26 for a/c hypoxic respiratory failure d/t combination acute COPDE and a/c CHF. Diuresed w/ IV Lasix  and transitioned back to PO, at increased dose of 40 mg bid (up from 20 mg daily)  He presents today for post hospital f/u. Here w/ para medicine (DeDe). He is feeling much better. Breathing improved. Feels back to baseline, NYHA Class I-II. Euvolemic on exam. Reports full med compliance, uses bubble packs. BP 110/78. No orthostatic symptoms. Denies CP.  Of note, he is now off of Entresto  and Spiro d/t renal fx and baseline SCr of 2. He is on Bidil  and bisoprolol . Continues on Farxgia.   Still smoking but has cut back, now down to no more than 3-4 a day.    Labs (7/24): urine immunofixation negative, myeloma panel negative Labs (9/24): BNP 553, K 3.5,  creatinine 1.75 Labs (10/24): K 4.1, creatinine 1.62 Labs (1/25): K 4.2, creatinine 3.9, LDL 55 Labs (3/25): K 4.4, creatinine 1.85, BNP 71  Labs (1/26): K 4.3, creatinine 1.99   ECG (personally reviewed): not performed   PMH: 1. OSA: Severe. CPAP. 2. COPD 3. HTN 4. 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  - R/LHC (5/23): nonobstructive CAD; RA mean 12, PA 54/29 (37), PCWP mean 23, LVEDP 22 mmHg, Fick CO/CI 3.84/2.12. - Echo (5/23): EF 35%, RV okay, RVSP 54 mmHg. - Echo (8/23): EF 40-45% w/ LVH - Echo (10/24): EF 40% with moderate LVH, global hypokinesis, mild RV enlargement with mildly decreased systolic function, IVC normal.  - PYP scan (6/24): Grade 2, H/CL 1.3.  Looks abnormal visually.   - [employer] gene testing negative for TTR mutations.  - cMRI 12/24 LVEF 40%, RVEF 46%, elevated ECV consistent with cardiac amyloidosis, extensive mid-wall LGE in the basal to mid septum, subepicardial LGE in the basal to mid inferior wall, and patchy mid-wall LGE in the basal to mid anterior wall. 5. Hyperlipidemia 6. Bradycardia: Complete HB noted while sleeping, thought to be due to severe OSA.  7. CAD: LHC (5/23) with 50% OM1 stenosis.  8. Syncope: ?vasovagal 9. Type 2 diabetes.   Current Outpatient Medications  Medication Sig Dispense Refill   Acoramidis , 712MG  Twice Daily, (ATTRUBY ) 356 MG TBPK Take 712 mg by mouth 2 (two) times daily. 112 each 11   aspirin  EC 81 MG tablet Take 1 tablet (81 mg total) by mouth daily. Swallow whole.     atorvastatin  (LIPITOR ) 80 MG tablet TAKE 1 Tablet BY MOUTH ONCE DAILY EVERY EVENING 90 tablet 3   bisoprolol  (ZEBETA ) 5 MG tablet Take 1 Tablet by mouth once daily (morning) 90 tablet 3   dapagliflozin  propanediol (FARXIGA ) 10 MG TABS tablet TAKE 1 Tablet BY MOUTH ONCE DAILY (MORNING) 30 tablet 11   ipratropium-albuterol  (DUONEB) 0.5-2.5 (3) MG/3ML SOLN Take 3 mLs by nebulization every 6 (six) hours as needed  (wheezing, shortness of breath).     isosorbide -hydrALAZINE  (BIDIL ) 20-37.5 MG tablet Take 0.5 tablets by mouth 3 (three) times daily. 45 tablet 5   [Paused] metFORMIN  (GLUCOPHAGE ) 500 MG tablet Take 1 tablet (500 mg total) by mouth 2 (two) times daily with a meal. 60 tablet 0   montelukast  (SINGULAIR ) 10 MG tablet Take 1 tablet (10 mg total) by mouth at bedtime. 30 tablet 2   TRELEGY ELLIPTA 100-62.5-25 MCG/ACT AEPB Take 1 puff by mouth daily.     furosemide  (LASIX ) 40 MG tablet Take 1 tablet (40 mg total) by mouth 2 (two) times daily. 60 tablet 5   No current facility-administered medications for this encounter.   Allergies  Allergen Reactions   Vyndamax  [Tafamidis ] Dermatitis and Rash    Social History   Socioeconomic History   Marital status: Single    Spouse name: Not on file   Number of children: 1   Years of education: Not on file   Highest education level: 10th grade  Occupational History   Occupation: Disability  Tobacco Use   Smoking status: Some Days  Current packs/day: 0.00    Average packs/day: 0.5 packs/day for 50.0 years (25.0 ttl pk-yrs)    Types: Cigarettes    Start date: 03/19/1972    Last attempt to quit: 03/19/2022    Years since quitting: 2.6   Smokeless tobacco: Never   Tobacco comments:    Smoking cessation  Vaping Use   Vaping status: Never Used  Substance and Sexual Activity   Alcohol use: Not on file    Comment: socially   Drug use: Yes    Types: Marijuana    Comment: past   Sexual activity: Not on file  Other Topics Concern   Not on file  Social History Narrative   He lives with his daughter apparently and 5 grandchildren.  He reports that he smokes probably less than a pack of cigarettes a day.  He occasionally drinks a beer or liquor but not daily.   Social Drivers of Health   Tobacco Use: High Risk (11/09/2024)   Patient History    Smoking Tobacco Use: Some Days    Smokeless Tobacco Use: Never    Passive Exposure: Not on file   Financial Resource Strain: Low Risk (03/11/2022)   Overall Financial Resource Strain (CARDIA)    Difficulty of Paying Living Expenses: Not very hard  Food Insecurity: No Food Insecurity (10/27/2024)   Epic    Worried About Programme Researcher, Broadcasting/film/video in the Last Year: Never true    Ran Out of Food in the Last Year: Never true  Transportation Needs: No Transportation Needs (10/27/2024)   Epic    Lack of Transportation (Medical): No    Lack of Transportation (Non-Medical): No  Recent Concern: Transportation Needs - Unmet Transportation Needs (10/26/2024)   Epic    Lack of Transportation (Medical): Yes    Lack of Transportation (Non-Medical): Yes  Physical Activity: Not on file  Stress: Not on file  Social Connections: Moderately Isolated (10/26/2024)   Social Connection and Isolation Panel    Frequency of Communication with Friends and Family: More than three times a week    Frequency of Social Gatherings with Friends and Family: More than three times a week    Attends Religious Services: 1 to 4 times per year    Active Member of [employer] or Organizations: No    Attends Banker Meetings: Never    Marital Status: Never married  Intimate Partner Violence: Not At Risk (10/27/2024)   Epic    Fear of Current or Ex-Partner: No    Emotionally Abused: No    Physically Abused: No    Sexually Abused: No  Depression (PHQ2-9): Not on file  Alcohol Screen: Low Risk (05/06/2022)   Alcohol Screen    Last Alcohol Screening Score (AUDIT): 0  Housing: Low Risk (10/27/2024)   Epic    Unable to Pay for Housing in the Last Year: No    Number of Times Moved in the Last Year: 0    Homeless in the Last Year: No  Utilities: Not At Risk (10/27/2024)   Epic    Threatened with loss of utilities: No  Health Literacy: Not on file   Family History  Problem Relation Age of Onset   Hypertension Mother    BP 110/78   Pulse 65   Ht 5' 5 (1.651 m)   Wt 67 kg (147 lb 9.6 oz)   SpO2 95%   BMI 24.56 kg/m   Wt  Readings from Last 3 Encounters:  11/09/24 67 kg (147 lb  9.6 oz)  11/08/24 64 kg (141 lb 3.2 oz)  10/28/24 63.6 kg (140 lb 4.8 oz)   PHYSICAL EXAM:  GENERAL: NAD Lungs- clear  CARDIAC:  JVP not elevated          Normal rate with regular rhythm. No murmur.  No LEE  ABDOMEN: Soft, non-tender, non-distended.  EXTREMITIES: Warm and well perfused.  NEUROLOGIC: No obvious FND  ASSESSMENT & PLAN:  1.  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 Echo (5/23) with LVEF 35%, RV okay.  [facility] 5/23 showed nonobstructive CAD; RA mean 12, PA 54/29 (37), PCWP mean 23, LVEDP 22 mmHg. Echo in 8/23 with EF 40-45%.  Echo in 10/24 showed EF 40% with moderate LVH, global hypokinesis, mild RV enlargement with mildly decreased systolic function, IVC normal. PYP scan concerning for TTR cardiac amyloidosis. The PYP scan was not read as floridly positive, but visually appeared positive. Myeloma studies were negative.  [employer] gene testing negative for TTR gene mutations.  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   He did not tolerate tafamidis  due to severe rash and was transitioned to acoramidis .  Cardiac MRI was done in 12/24, showing LVEF 40%, RVEF 46%, elevated ECV 40% consistent with cardiac amyloidosis, extensive mid-wall LGE in the basal to mid septum, subepicardial LGE in the basal to mid inferior wall, and patchy mid-wall LGE in the basal to mid anterior wall. Elevated ECV percentage on cardiac MRI was consistent with cardiac amyloidosis though LGE pattern was not classic for amyloidosis and could also be seen with cardiac sarcoidosis or an arrhythmogenic cardiomyopathy.  Cardiac PET 9/25 without evidence of active sarcoidosis. HRCT ordered but not completed yet.  - Improved NYHA Class I-II symptoms, Euvolemic on exam.  - Continue Lasix  40 mg bid. Check BMP today.  - He is now on acoramidis , continue  - Continue Farxiga  10 mg daily  - off Spiro and Entresto   d/t elevated SCr  - Continue Bidil  1/2 tablet tid   - Continue bisoprolol  5 mg daily - Will send for genetic counseling for cardiomyopathies. If questions remain about his diagnosis after these studies, endomyocardial biopsy would be appropriate 2. Syncope:  Multiple episodes in the past. Evaluated in the ED 12/23. Event felt to by vasovagal.  2 week Zio (6/23) showed mostly NSR, significant bradycardia during sleeping hours w/ intermittent CHB thought to be due to severe OSA. He denies symptoms of dizziness/lightheadedness - he is not compliant w/ CPAP.  3. HTN: controlled on current regimen  - Continue current meds. 4. OSA: Severe by sleep study.   - non compliant w/ CPAP. Unable to tolerate mask  5. CAD: Nonobstructive on [facility] 5/23.  Denies CP  - Continue aspirin  and statin. 6. COPD: Still smoking. Has history of COPD exacerbations though minimally symptomatic at this time.  - encouraged smoking cessation  - On Trelegy. Followed by Pulmonary   F/u w/ Dr. Rolan in 3 months.   Caffie Shed, PA-C 11/09/24  "

## 2024-11-09 NOTE — Progress Notes (Signed)
 Paramedicine Encounter   Patient ID: Marcus Tran , male,   DOB: 11-Mar-1951,73 y.o.,  MRN: [id number]   Met patient in clinic today with provider.   Tzphyu@ clinic- 147.6lb B/P-110/78 [id number] [id number]   Med changes today (if any) :  Labs completed w/ no med changes.   Mary Sharps, EMT-Paramedic [phone] 11/09/2024

## 2024-11-15 ENCOUNTER — Encounter: Payer: Self-pay | Admitting: Pulmonary Disease

## 2024-11-15 ENCOUNTER — Ambulatory Visit: Admitting: Pulmonary Disease

## 2024-11-15 ENCOUNTER — Other Ambulatory Visit (HOSPITAL_COMMUNITY): Payer: Self-pay | Admitting: Emergency Medicine

## 2024-11-15 VITALS — BP 121/81 | HR 66 | Temp 97.9°F | Ht 65.0 in | Wt 150.0 lb

## 2024-11-15 DIAGNOSIS — J432 Centrilobular emphysema: Secondary | ICD-10-CM

## 2024-11-15 DIAGNOSIS — R059 Cough, unspecified: Secondary | ICD-10-CM | POA: Diagnosis not present

## 2024-11-15 DIAGNOSIS — R0609 Other forms of dyspnea: Secondary | ICD-10-CM | POA: Diagnosis not present

## 2024-11-15 DIAGNOSIS — Z87891 Personal history of nicotine dependence: Secondary | ICD-10-CM | POA: Diagnosis not present

## 2024-11-15 MED ORDER — TRELEGY ELLIPTA 100-62.5-25 MCG/ACT IN AEPB: 1.0000 | INHALATION_SPRAY | Freq: Every day | RESPIRATORY_TRACT | 11 refills | Status: AC

## 2024-11-15 NOTE — Patient Instructions (Signed)
 Nice to see you again  I refilled the Trelegy, 1 puff once a day.  Rinse your mouth out with water  after every use.  No changes to medication at this time  If you are doing well at next visit we can get blood work to see if injection medicines or Biologics would help with your breathing  Return to clinic in 3 months or sooner as needed with Dr. Annella

## 2024-11-15 NOTE — Progress Notes (Unsigned)
 Paramedicine Encounter    Patient ID: Marcus Tran, male    DOB: May 26, 1951, 74 y.o.   MRN: [id number]   Complaints***  Assessment***  Compliance with meds***  Pill box filled***  Refills needed***  Meds changes since last visit***    Social changes***   Wt 150 lb (68 kg)   BMI 24.96 kg/m  Weight yesterday- Last visit weight-146.6lb  ACTION: {Paramed Action:[phone]}  Mary Sharps, EMT-Paramedic [phone] 11/15/24  Patient Care Team: [name], NP as PCP - General Lavona Agent, MD as PCP - Cardiology (Cardiology) Rolan Ezra RAMAN, MD as PCP - Advanced Heart Failure (Cardiology) Hunsucker, Donnice SAUNDERS, MD as Consulting Physician (Pulmonary Disease)  Patient Active Problem List   Diagnosis Date Noted   Acute systolic CHF (congestive heart failure) (HCC) 10/26/2024   Cocaine use 01/15/2023   Noncompliance 01/13/2023   Adenovirus pneumonia 11/12/2022   Acute on chronic systolic congestive heart failure (HCC) 08/13/2022   DM2 (diabetes mellitus, type 2) (HCC) 08/13/2022   Obstructive sleep apnea (adult) (pediatric) 08/08/2022   Acute respiratory failure with hypoxia and hypercapnia (HCC) 05/04/2022   Third degree heart block (HCC) 04/15/2022   Snoring 04/15/2022   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   HFrEF (heart failure with reduced ejection fraction) (HCC) 04/15/2022   COPD with acute exacerbation (HCC) 03/22/2022   Acute respiratory failure with hypoxia (HCC) 03/20/2022   Chronic combined systolic and diastolic CHF (congestive heart failure) (HCC)    Tobacco use disorder 03/08/2022   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   HTN (hypertension) 03/08/2022   Stage 3a chronic kidney disease (CKD) (HCC) 03/08/2022   Coronary artery disease 03/08/2022   Current Medications[1] Allergies[2]   Social History   Socioeconomic History   Marital status: Single    Spouse name: Not on file   Number of children: 1   Years  of education: Not on file   Highest education level: 10th grade  Occupational History   Occupation: Disability  Tobacco Use   Smoking status: Former    Current packs/day: 0.00    Average packs/day: 0.5 packs/day for 50.0 years (25.0 ttl pk-yrs)    Types: Cigarettes    Start date: 03/19/1972    Quit date: 03/19/2022    Years since quitting: 2.6   Smokeless tobacco: Never   Tobacco comments:    Smoking cessation Smokes 5 to 6 cigarettes with drink or coffee.  Vaping Use   Vaping status: Never Used  Substance and Sexual Activity   Alcohol use: Not on file    Comment: socially   Drug use: Yes    Types: Marijuana    Comment: past   Sexual activity: Not on file  Other Topics Concern   Not on file  Social History Narrative   He lives with his daughter apparently and 5 grandchildren.  He reports that he smokes probably less than a pack of cigarettes a day.  He occasionally drinks a beer or liquor but not daily.   Social Drivers of Health   Tobacco Use: Medium Risk (11/15/2024)   Patient History    Smoking Tobacco Use: Former    Smokeless Tobacco Use: Never    Passive Exposure: Not on file  Financial Resource Strain: Low Risk (03/11/2022)   Overall Financial Resource Strain (CARDIA)    Difficulty of Paying Living Expenses: Not very hard  Food Insecurity: No Food Insecurity (10/27/2024)   Epic    Worried About Running Out of Food in the Last Year: Never true  Ran Out of Food in the Last Year: Never true  Transportation Needs: No Transportation Needs (10/27/2024)   Epic    Lack of Transportation (Medical): No    Lack of Transportation (Non-Medical): No  Recent Concern: Transportation Needs - Unmet Transportation Needs (10/26/2024)   Epic    Lack of Transportation (Medical): Yes    Lack of Transportation (Non-Medical): Yes  Physical Activity: Not on file  Stress: Not on file  Social Connections: Moderately Isolated (10/26/2024)   Social Connection and Isolation Panel     Frequency of Communication with Friends and Family: More than three times a week    Frequency of Social Gatherings with Friends and Family: More than three times a week    Attends Religious Services: 1 to 4 times per year    Active Member of [employer] or Organizations: No    Attends Banker Meetings: Never    Marital Status: Never married  Intimate Partner Violence: Not At Risk (10/27/2024)   Epic    Fear of Current or Ex-Partner: No    Emotionally Abused: No    Physically Abused: No    Sexually Abused: No  Depression (PHQ2-9): Not on file  Alcohol Screen: Low Risk (05/06/2022)   Alcohol Screen    Last Alcohol Screening Score (AUDIT): 0  Housing: Low Risk (10/27/2024)   Epic    Unable to Pay for Housing in the Last Year: No    Number of Times Moved in the Last Year: 0    Homeless in the Last Year: No  Utilities: Not At Risk (10/27/2024)   Epic    Threatened with loss of utilities: No  Health Literacy: Not on file    Physical Exam      Future Appointments  Date Time Provider Department Center  11/28/2024 11:00 AM Fairy Danford HERO, PhD [facility]  02/12/2025 11:00 AM Rolan Ezra RAMAN, MD [facility] None  02/13/2025 10:00 AM Hunsucker, Donnice SAUNDERS, MD [facility] [address]            [1]  Current Outpatient Medications:    Acoramidis , 712MG  Twice Daily, (ATTRUBY ) 356 MG TBPK, Take 712 mg by mouth 2 (two) times daily., Disp: 112 each, Rfl: 11   aspirin  EC 81 MG tablet, Take 1 tablet (81 mg total) by mouth daily. Swallow whole., Disp: , Rfl:    atorvastatin  (LIPITOR ) 80 MG tablet, TAKE 1 Tablet BY MOUTH ONCE DAILY EVERY EVENING, Disp: 90 tablet, Rfl: 3   bisoprolol  (ZEBETA ) 5 MG tablet, Take 1 Tablet by mouth once daily (morning), Disp: 90 tablet, Rfl: 3   dapagliflozin  propanediol (FARXIGA ) 10 MG TABS tablet, TAKE 1 Tablet BY MOUTH ONCE DAILY (MORNING), Disp: 30 tablet, Rfl: 11   furosemide  (LASIX ) 40 MG tablet, Take 1 tablet (40 mg total) by  mouth 2 (two) times daily., Disp: 60 tablet, Rfl: 5   ipratropium-albuterol  (DUONEB) 0.5-2.5 (3) MG/3ML SOLN, Take 3 mLs by nebulization every 6 (six) hours as needed (wheezing, shortness of breath)., Disp: , Rfl:    isosorbide -hydrALAZINE  (BIDIL ) 20-37.5 MG tablet, Take 0.5 tablets by mouth 3 (three) times daily., Disp: 45 tablet, Rfl: 5   TRELEGY ELLIPTA  100-62.5-25 MCG/ACT AEPB, Take 1 puff by mouth daily., Disp: 60 each, Rfl: 11   [Paused] metFORMIN  (GLUCOPHAGE ) 500 MG tablet, Take 1 tablet (500 mg total) by mouth 2 (two) times daily with a meal. (Patient not taking: Reported on 11/15/2024), Disp: 60 tablet, Rfl: 0   montelukast  (SINGULAIR ) 10 MG tablet, Take 1  tablet (10 mg total) by mouth at bedtime. (Patient not taking: Reported on 11/15/2024), Disp: 30 tablet, Rfl: 2 [2] Allergies Allergen Reactions   Vyndamax  [Tafamidis ] Dermatitis and Rash

## 2024-11-15 NOTE — Progress Notes (Signed)
 "  @Patient  ID: Marcus Tran, male    DOB: [birth year], 74 y.o.   MRN: [id number]  Chief Complaint  Patient presents with   Hospitalization Follow-up    Patient states he's doing well.    Referring provider: [name], NP  HPI:   74 y.o. whom we are seeing for hospital follow-up of acute hypoxemic respiratory failure due to volume overload and possible underlying exacerbation of small airways disease.  Multiple notes from the hospital reviewed including H&P and discharge summary.  Last admitted to the hospital in 2023.  Last when I last saw him in clinic.  2-1/2 years.  Presented to the hospital with shortness of breath.  Low oxygen.  BNP was elevated.  He received diuresis and route via EMS.  There is some concern for underlying exacerbation of possible COPD.  Notably he is never performed pulmonary function test due to inability to complete these tests in the past.  Unable to say if he has COPD or not.  He was treated in the hospital with IV Lasix .  He was also treated with steroids and antibiotics.  He was discharged on a prednisone  taper.  Overall he is doing well.  Feels great.  No issues with worsening dyspnea etc.  Denies lower extremity swelling.  Denies orthopnea or PND.  No chest pain.  He is taking Trelegy, low-dose as prescribed.  He thinks he needs refills on this.  His lungs are clear on exam today.   HPI at initial visit: Overall, patient doing well.  Denies significant dyspnea.  He was admitted in the hospital for several weeks with acute hypoxemic respiratory failure.  Gradually improved with aggressive diuresis.  Was weaned off oxygen.  Also treated for possible COPD exacerbation.  No PFTs to formally diagnose.  Discharged on triple inhaled maintenance therapy.  He reports good adherence to this via Breztri .  He brings us  to the office today.  Some confusion about rescue inhaler.  Seems like he is out of albuterol .  This was refilled.  He reports good adherence to diuretics.   Weight is stable.  Not going up.  Quit smoking.  He was congratulated on this.  About 25-pack-year history.  Review chest x-ray 03/06/2022 which shows interlobular septal thickening in concerning for pulmonary edema.  CTA PE protocol same day reviewed which reveals no pleural effusions, mild interlobular septal thickening at the bases and dependent areas, emphysema in the upper lobes.  8.6 mm right lower lobe nodule.  Subsequent chest x-ray 03/20/2022 with similar but improved interstitial prominence on my review and interpretation.  CTA PE protocol same day shows resolution of nodule seen previously with improved lower lobe findings, emphysematous changes persist.  TTE 02/2022 reveals EF 35%, LVEDP at 22 at that time with elevated mean PA pressure 37 on right heart cath, PVR 3.9 Woods units on my calculation.   Questionaires / Pulmonary Flowsheets:   ACT:      No data to display          MMRC:     No data to display          Epworth:      No data to display          Tests:   FENO:  No results found for: NITRICOXIDE  PFT:     No data to display          WALK:      No data to display  Imaging: Personally reviewed ECHOCARDIOGRAM COMPLETE Result Date: 10/27/2024    ECHOCARDIOGRAM REPORT   Patient Name:   Marcus Tran Date of Exam: 10/27/2024 Medical Rec #:  [id number]    Height:       65.0 in Accession #:    [id number]   Weight:       138.2 lb Date of Birth:  Apr 19, 1951    BSA:          1.691 m Patient Age:    73 years     BP:           145/99 mmHg Patient Gender: M            HR:           67 bpm. Exam Location:  Inpatient Procedure: 2D Echo, Cardiac Doppler, Color Doppler and Intracardiac            Opacification Agent (Both Spectral and Color Flow Doppler were            utilized during procedure). Indications:    CHF I50.21  History:        Patient has prior history of Echocardiogram examinations, most                 recent 03/06/2022. CHF; Risk  Factors:Hypertension and                 Dyslipidemia.  Sonographer:    Marcus Tran RDCS Referring Phys: [id number] Marcus Tran IMPRESSIONS  1. Left ventricular ejection fraction, by estimation, is 40 to 45%. The left ventricle has mildly decreased function. The left ventricle demonstrates regional wall motion abnormalities (see scoring diagram/findings for description). The left ventricular  internal cavity size was moderately dilated. Left ventricular diastolic parameters are consistent with Grade I diastolic dysfunction (impaired relaxation).  2. Right ventricular systolic function is mildly reduced. The right ventricular size is normal. There is mildly elevated pulmonary artery systolic pressure.  3. Left atrial size was mildly dilated.  4. Right atrial size was mildly dilated.  5. The mitral valve is degenerative. Trivial mitral valve regurgitation. No evidence of mitral stenosis.  6. The tricuspid valve is degenerative.  7. The aortic valve is tricuspid. Aortic valve regurgitation is trivial. Aortic valve sclerosis/calcification is present, without any evidence of aortic stenosis.  8. Aortic dilatation noted. There is borderline dilatation of the ascending aorta, measuring 37 mm.  9. The inferior vena cava is normal in size with greater than 50% respiratory variability, suggesting right atrial pressure of 3 mmHg. Comparison(s): A prior study was performed on 08/13/23. Similar ejection fraction, previously reported global hypokinesis however on side by side comparison, there were similar wall motion abnormalities previously. No other significant changes. FINDINGS  Left Ventricle: Visually moderate LVH. Left ventricular ejection fraction, by estimation, is 40 to 45%. The left ventricle has mildly decreased function. The left ventricle demonstrates regional wall motion abnormalities. Definity  contrast agent was given IV to delineate the left ventricular endocardial borders. The left ventricular internal cavity  size was moderately dilated. There is no left ventricular hypertrophy. Left ventricular diastolic parameters are consistent with Grade I diastolic dysfunction (impaired relaxation).  LV Wall Scoring: The entire inferior wall, basal anteroseptal segment, mid inferoseptal segment, and basal inferoseptal segment are akinetic. Right Ventricle: The right ventricular size is normal. No increase in right ventricular wall thickness. Right ventricular systolic function is mildly reduced. There is mildly elevated pulmonary artery systolic pressure. The tricuspid regurgitant velocity  is 3.05 m/s, and with  an assumed right atrial pressure of 3 mmHg, the estimated right ventricular systolic pressure is 40.2 mmHg. Left Atrium: Left atrial size was mildly dilated. Right Atrium: Right atrial size was mildly dilated. Pericardium: There is no evidence of pericardial effusion. Mitral Valve: The mitral valve is degenerative in appearance. Mild mitral annular calcification. Trivial mitral valve regurgitation. No evidence of mitral valve stenosis. Tricuspid Valve: The tricuspid valve is degenerative in appearance. Tricuspid valve regurgitation is mild . No evidence of tricuspid stenosis. Aortic Valve: The aortic valve is tricuspid. Aortic valve regurgitation is trivial. Aortic valve sclerosis/calcification is present, without any evidence of aortic stenosis. Pulmonic Valve: The pulmonic valve was normal in structure. Pulmonic valve regurgitation is not visualized. No evidence of pulmonic stenosis. Aorta: The aortic root is normal in size and structure and aortic dilatation noted. There is borderline dilatation of the ascending aorta, measuring 37 mm. Venous: The inferior vena cava is normal in size with greater than 50% respiratory variability, suggesting right atrial pressure of 3 mmHg. IAS/Shunts: No atrial level shunt detected by color flow Doppler.  LEFT VENTRICLE PLAX 2D LVIDd:         5.40 cm      Diastology LVIDs:         4.50 cm       LV e' medial:    3.48 cm/s LV PW:         0.80 cm      LV E/e' medial:  17.5 LV IVS:        0.70 cm      LV e' lateral:   5.33 cm/s LVOT diam:     1.90 cm      LV E/e' lateral: 11.4 LV SV:         39 LV SV Index:   23 LVOT Area:     2.84 cm LV IVRT:       127 msec  LV Volumes (MOD) LV vol d, MOD A2C: 112.0 ml LV vol d, MOD A4C: 122.0 ml LV vol s, MOD A2C: 44.7 ml LV vol s, MOD A4C: 39.1 ml LV SV MOD A2C:     67.3 ml LV SV MOD A4C:     122.0 ml LV SV MOD BP:      76.7 ml RIGHT VENTRICLE            IVC RV S prime:     8.27 cm/s  IVC diam: 1.40 cm TAPSE (M-mode): 2.3 cm LEFT ATRIUM             Index        RIGHT ATRIUM           Index LA diam:        3.30 cm 1.95 cm/m   RA Area:     15.40 cm LA Vol (A2C):   41.0 ml 24.25 ml/m  RA Volume:   41.60 ml  24.61 ml/m LA Vol (A4C):   74.3 ml 43.95 ml/m LA Biplane Vol: 55.6 ml 32.89 ml/m  AORTIC VALVE             PULMONIC VALVE LVOT Vmax:   74.40 cm/s  PV Vmax:       3.05 m/s LVOT Vmean:  47.400 cm/s PV Peak grad:  37.2 mmHg LVOT VTI:    0.139 m  AORTA Ao Root diam: 3.00 cm Ao Asc diam:  3.70 cm MITRAL VALVE               TRICUSPID VALVE MV  Area (PHT): 3.49 cm    TR Peak grad:   37.2 mmHg MV E velocity: 60.80 cm/s  TR Vmax:        305.00 cm/s MV A velocity: 77.60 cm/s MV E/A ratio:  0.78        SHUNTS                            Systemic VTI:  0.14 m                            Systemic Diam: 1.90 cm Emeline Calender Electronically signed by Emeline Calender Signature Date/Time: 10/27/2024/11:22:59 AM    Final    DG Chest Portable 1 View Result Date: 10/26/2024 CLINICAL DATA:  Difficulty breathing. EXAM: PORTABLE CHEST 1 VIEW COMPARISON:  September 14, 2023 FINDINGS: The heart size and mediastinal contours are within normal limits. Very mildly increased interstitial lung markings are seen mild areas of atelectasis and/or infiltrate are noted within the bilateral lung bases. No pleural effusion or pneumothorax is identified. The visualized skeletal structures are unremarkable.  IMPRESSION: 1. Mild bibasilar atelectasis and/or infiltrate. 2. Mildly increased interstitial lung markings which may be, in part, chronic in nature. Mild interstitial edema cannot be excluded. Electronically Signed   By: Suzen Dials M.D.   On: 10/26/2024 15:10    Lab Results: Personally reviewed CBC    Component Value Date/Time   WBC 4.5 10/26/2024 1556   RBC 4.17 (L) 10/26/2024 1556   HGB 12.6 (L) 10/26/2024 1556   HCT 38.0 (L) 10/26/2024 1556   PLT 210 10/26/2024 1556   MCV 91.1 10/26/2024 1556   MCH 30.2 10/26/2024 1556   MCHC 33.2 10/26/2024 1556   RDW 14.7 10/26/2024 1556   LYMPHSABS 0.9 10/26/2024 1556   MONOABS 0.4 10/26/2024 1556   EOSABS 0.2 10/26/2024 1556   BASOSABS 0.0 10/26/2024 1556    BMET    Component Value Date/Time   NA 133 (L) 11/09/2024 1133   NA 139 04/16/2022 1409   K 4.5 11/09/2024 1133   CL 96 (L) 11/09/2024 1133   CO2 25 11/09/2024 1133   GLUCOSE 119 (H) 11/09/2024 1133   BUN 31 (H) 11/09/2024 1133   BUN 9 04/16/2022 1409   CREATININE 1.84 (H) 11/09/2024 1133   CALCIUM  9.1 11/09/2024 1133   GFRNONAA 38 (L) 11/09/2024 1133   GFRAA  11/10/2022 2321    SPECIMEN HEMOLYZED. HEMOLYSIS MAY AFFECT INTEGRITY OF RESULTS.    BNP    Component Value Date/Time   BNP 303.4 (H) 06/14/2024 1033    ProBNP    Component Value Date/Time   PROBNP 3,938.0 (H) 10/27/2024 0409    Specialty Problems       Pulmonary Problems   Bilateral hilar adenopathy syndrome   Acute respiratory failure with hypoxia (HCC)   COPD with acute exacerbation (HCC)   Snoring   Acute respiratory failure with hypoxia and hypercapnia (HCC)   Obstructive sleep apnea (adult) (pediatric)   Adenovirus pneumonia    Allergies  Allergen Reactions   Vyndamax  [Tafamidis ] Dermatitis and Rash    Immunization History  Administered Date(s) Administered   Tdap 09/14/2023    Past Medical History:  Diagnosis Date   Acute metabolic encephalopathy 03/08/2022   Acute  respiratory failure with hypoxia (HCC) 03/20/2022   Acute respiratory failure with hypoxia and hypercapnia (HCC) 03/06/2022   CHF (congestive heart failure) (HCC)    Dyslipidemia  Hypertension     Tobacco History: Social History   Tobacco Use  Smoking Status Former   Current packs/day: 0.00   Average packs/day: 0.5 packs/day for 50.0 years (25.0 ttl pk-yrs)   Types: Cigarettes   Start date: 03/19/1972   Quit date: 03/19/2022   Years since quitting: 2.6  Smokeless Tobacco Never  Tobacco Comments   Smoking cessation Smokes 5 to 6 cigarettes with drink or coffee.   Counseling given: Yes Tobacco comments: Smoking cessation Smokes 5 to 6 cigarettes with drink or coffee.   Continue to not smoke  Outpatient Encounter Medications as of 11/15/2024  Medication Sig   Acoramidis , 712MG  Twice Daily, (ATTRUBY ) 356 MG TBPK Take 712 mg by mouth 2 (two) times daily.   aspirin  EC 81 MG tablet Take 1 tablet (81 mg total) by mouth daily. Swallow whole.   atorvastatin  (LIPITOR ) 80 MG tablet TAKE 1 Tablet BY MOUTH ONCE DAILY EVERY EVENING   bisoprolol  (ZEBETA ) 5 MG tablet Take 1 Tablet by mouth once daily (morning)   dapagliflozin  propanediol (FARXIGA ) 10 MG TABS tablet TAKE 1 Tablet BY MOUTH ONCE DAILY (MORNING)   furosemide  (LASIX ) 40 MG tablet Take 1 tablet (40 mg total) by mouth 2 (two) times daily.   ipratropium-albuterol  (DUONEB) 0.5-2.5 (3) MG/3ML SOLN Take 3 mLs by nebulization every 6 (six) hours as needed (wheezing, shortness of breath).   isosorbide -hydrALAZINE  (BIDIL ) 20-37.5 MG tablet Take 0.5 tablets by mouth 3 (three) times daily.   [Paused] metFORMIN  (GLUCOPHAGE ) 500 MG tablet Take 1 tablet (500 mg total) by mouth 2 (two) times daily with a meal.   [DISCONTINUED] TRELEGY ELLIPTA  100-62.5-25 MCG/ACT AEPB Take 1 puff by mouth daily.   montelukast  (SINGULAIR ) 10 MG tablet Take 1 tablet (10 mg total) by mouth at bedtime. (Patient not taking: Reported on 11/15/2024)   TRELEGY ELLIPTA   100-62.5-25 MCG/ACT AEPB Take 1 puff by mouth daily.   No facility-administered encounter medications on file as of 11/15/2024.     Review of Systems  Review of Systems  N/a Physical Exam  BP 121/81 (BP Location: Left Arm, Patient Position: Sitting, Cuff Size: Normal)   Pulse 66   Temp 97.9 F (36.6 C) (Oral)   Ht 5' 5 (1.651 m)   Wt 150 lb (68 kg)   SpO2 96%   BMI 24.96 kg/m   Wt Readings from Last 5 Encounters:  11/15/24 150 lb (68 kg)  11/09/24 147 lb 9.6 oz (67 kg)  11/08/24 141 lb 3.2 oz (64 kg)  10/28/24 140 lb 4.8 oz (63.6 kg)  09/08/24 138 lb 12.8 oz (63 kg)    BMI Readings from Last 5 Encounters:  11/15/24 24.96 kg/m  11/09/24 24.56 kg/m  11/08/24 23.50 kg/m  10/28/24 23.35 kg/m  09/08/24 22.75 kg/m     Physical Exam General: Sitting in exam chair, no distress Eyes: No icterus Neck: No JVP appreciated sitting upright Pulmonary: Distant sounds throughout, diminished,, clear bilaterally Cardiovascular: Regular rate and rhythm, no edema Abdomen: Nondistended   Assessment & Plan:   Dyspnea on exertion: Certainly multifactorial relating to cardiac and pulmonary etiologies.  Suspect smoking-related lung disease.  PFTs not able to be obtained to date.  Continue Trelegy, low-dose.  Refilled today.  Notably he has used ejection fraction and EDP in the past and multiple admissions for volume overload.  Cough: Felt to be related to chronic bronchitis.  Largely unchanged.  Mild.  Continue Breztri .  Recent chest imaging clear.   Return in about 3 months (around 02/13/2025)  for f/u Dr. Annella.   Donnice JONELLE Annella, MD 11/15/2024   This appointment required 40 minutes of patient care (this includes precharting, chart review, review of results, face-to-face care, etc.).  "

## 2024-11-16 ENCOUNTER — Other Ambulatory Visit: Payer: Self-pay | Admitting: Pulmonary Disease

## 2024-11-16 MED ORDER — MONTELUKAST SODIUM 10 MG PO TABS: 10.0000 mg | ORAL_TABLET | Freq: Every day | ORAL | 4 refills | Status: AC

## 2024-11-17 ENCOUNTER — Other Ambulatory Visit: Payer: Self-pay | Admitting: Pharmacy Technician

## 2024-11-17 ENCOUNTER — Other Ambulatory Visit: Payer: Self-pay

## 2024-11-17 NOTE — Progress Notes (Signed)
 Specialty Pharmacy Refill Coordination Note  Marcus Tran is a 74 y.o. male contacted today regarding refills of specialty medication(s) Acoramidis  HCl (Attruby )   Patient requested Delivery   Delivery date: 11/22/24   Verified address: [address]  [place] [id number]   Medication will be filled on: 11/21/24    11/17/2024 Spoke with Crystal (Daughter)

## 2024-11-20 ENCOUNTER — Other Ambulatory Visit (HOSPITAL_COMMUNITY): Payer: Self-pay

## 2024-11-21 ENCOUNTER — Other Ambulatory Visit: Payer: Self-pay

## 2024-11-22 ENCOUNTER — Other Ambulatory Visit (HOSPITAL_COMMUNITY): Payer: Self-pay | Admitting: Emergency Medicine

## 2024-11-22 NOTE — Progress Notes (Unsigned)
 Paramedicine Encounter    Patient ID: Marcus Tran, male    DOB: 1950/12/29, 74 y.o.   MRN: [id number]   Complaints NONE  Assessment A&O x 4, skin W&D w/ good color.  Denies chest pain or SOB.  Lung sounds clear and equal bilat  Compliance with meds not compliant w/ his trelegy inhaler and missing doses of Isosorbide -Hydralazine   Pill box filled x 1 week  Refills needed Furosemide  40 BID  Meds changes since last visit Restarted Singular    Social changes none   BP 120/80 (BP Location: Right Arm, Patient Position: Sitting, Cuff Size: Small)   Pulse 62   Resp 14   Wt 141 lb 3.2 oz (64 kg)   SpO2 95%   BMI 23.50 kg/m   Weight yesterday- Last visit weight-150lb  ACTION: Home visit completed  [name] [phone] 11/22/24  Patient Care Team: [name], NP as PCP - General Lavona Agent, MD as PCP - Cardiology (Cardiology) Rolan Ezra RAMAN, MD as PCP - Advanced Heart Failure (Cardiology) Hunsucker, Donnice SAUNDERS, MD as Consulting Physician (Pulmonary Disease)  Patient Active Problem List   Diagnosis Date Noted   Acute systolic CHF (congestive heart failure) (HCC) 10/26/2024   Cocaine use 01/15/2023   Noncompliance 01/13/2023   Adenovirus pneumonia 11/12/2022   Acute on chronic systolic congestive heart failure (HCC) 08/13/2022   DM2 (diabetes mellitus, type 2) (HCC) 08/13/2022   Obstructive sleep apnea (adult) (pediatric) 08/08/2022   Acute respiratory failure with hypoxia and hypercapnia (HCC) 05/04/2022   Third degree heart block (HCC) 04/15/2022   Snoring 04/15/2022   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   HFrEF (heart failure with reduced ejection fraction) (HCC) 04/15/2022   COPD with acute exacerbation (HCC) 03/22/2022   Acute respiratory failure with hypoxia (HCC) 03/20/2022   Chronic combined systolic and diastolic CHF (congestive heart failure) (HCC)    Tobacco use disorder 03/08/2022   Bilateral hilar adenopathy  syndrome 03/08/2022   HTN (hypertension) 03/08/2022   Stage 3a chronic kidney disease (CKD) (HCC) 03/08/2022   Coronary artery disease 03/08/2022   Current Medications[1] Allergies[2]   Social History   Socioeconomic History   Marital status: Single    Spouse name: Not on file   Number of children: 1   Years of education: Not on file   Highest education level: 10th grade  Occupational History   Occupation: Disability  Tobacco Use   Smoking status: Former    Current packs/day: 0.00    Average packs/day: 0.5 packs/day for 50.0 years (25.0 ttl pk-yrs)    Types: Cigarettes    Start date: 03/19/1972    Quit date: 03/19/2022    Years since quitting: 2.6   Smokeless tobacco: Never   Tobacco comments:    Smoking cessation Smokes 5 to 6 cigarettes with drink or coffee.  Vaping Use   Vaping status: Never Used  Substance and Sexual Activity   Alcohol use: Not on file    Comment: socially   Drug use: Yes    Types: Marijuana    Comment: past   Sexual activity: Not on file  Other Topics Concern   Not on file  Social History Narrative   He lives with his daughter apparently and 5 grandchildren.  He reports that he smokes probably less than a pack of cigarettes a day.  He occasionally drinks a beer or liquor but not daily.   Social Drivers of Health   Tobacco Use: Medium Risk (11/15/2024)   Patient History  Smoking Tobacco Use: Former    Smokeless Tobacco Use: Never    Passive Exposure: Not on file  Financial Resource Strain: Low Risk (03/11/2022)   Overall Financial Resource Strain (CARDIA)    Difficulty of Paying Living Expenses: Not very hard  Food Insecurity: No Food Insecurity (10/27/2024)   Epic    Worried About Programme Researcher, Broadcasting/film/video in the Last Year: Never true    Ran Out of Food in the Last Year: Never true  Transportation Needs: No Transportation Needs (10/27/2024)   Epic    Lack of Transportation (Medical): No    Lack of Transportation  (Non-Medical): No  Recent Concern: Transportation Needs - Unmet Transportation Needs (10/26/2024)   Epic    Lack of Transportation (Medical): Yes    Lack of Transportation (Non-Medical): Yes  Physical Activity: Not on file  Stress: Not on file  Social Connections: Moderately Isolated (10/26/2024)   Social Connection and Isolation Panel    Frequency of Communication with Friends and Family: More than three times a week    Frequency of Social Gatherings with Friends and Family: More than three times a week    Attends Religious Services: 1 to 4 times per year    Active Member of [employer] or Organizations: No    Attends Banker Meetings: Never    Marital Status: Never married  Intimate Partner Violence: Not At Risk (10/27/2024)   Epic    Fear of Current or Ex-Partner: No    Emotionally Abused: No    Physically Abused: No    Sexually Abused: No  Depression (PHQ2-9): Not on file  Alcohol Screen: Low Risk (05/06/2022)   Alcohol Screen    Last Alcohol Screening Score (AUDIT): 0  Housing: Low Risk (10/27/2024)   Epic    Unable to Pay for Housing in the Last Year: No    Number of Times Moved in the Last Year: 0    Homeless in the Last Year: No  Utilities: Not At Risk (10/27/2024)   Epic    Threatened with loss of utilities: No  Health Literacy: Not on file    Physical Exam      Future Appointments  Date Time Provider Department Center  11/28/2024 11:00 AM Fairy Danford HERO, PhD [facility]  02/12/2025 11:00 AM Rolan Ezra RAMAN, MD [facility] None  02/13/2025 10:00 AM Hunsucker, Donnice SAUNDERS, MD [facility] [address]            [1]  Current Outpatient Medications:    Acoramidis , 712MG  Twice Daily, (ATTRUBY ) 356 MG TBPK, Take 712 mg by mouth 2 (two) times daily., Disp: 112 each, Rfl: 11   aspirin  EC 81 MG tablet, Take 1 tablet (81 mg total) by mouth daily. Swallow whole., Disp: , Rfl:    atorvastatin  (LIPITOR ) 80 MG tablet, TAKE 1 Tablet BY MOUTH ONCE  DAILY EVERY EVENING, Disp: 90 tablet, Rfl: 3   bisoprolol  (ZEBETA ) 5 MG tablet, Take 1 Tablet by mouth once daily (morning), Disp: 90 tablet, Rfl: 3   dapagliflozin  propanediol (FARXIGA ) 10 MG TABS tablet, TAKE 1 Tablet BY MOUTH ONCE DAILY (MORNING), Disp: 30 tablet, Rfl: 11   ipratropium-albuterol  (DUONEB) 0.5-2.5 (3) MG/3ML SOLN, Take 3 mLs by nebulization every 6 (six) hours as needed (wheezing, shortness of breath)., Disp: , Rfl:    isosorbide -hydrALAZINE  (BIDIL ) 20-37.5 MG tablet, Take 0.5 tablets by mouth 3 (three) times daily., Disp: 45 tablet, Rfl: 5   montelukast  (SINGULAIR ) 10 MG tablet, Take 1 tablet (10 mg total)  by mouth at bedtime., Disp: 90 tablet, Rfl: 4   TRELEGY ELLIPTA  100-62.5-25 MCG/ACT AEPB, Take 1 puff by mouth daily., Disp: 60 each, Rfl: 11   furosemide  (LASIX ) 40 MG tablet, Take 1 tablet (40 mg total) by mouth 2 (two) times daily., Disp: 60 tablet, Rfl: 5   [Paused] metFORMIN  (GLUCOPHAGE ) 500 MG tablet, Take 1 tablet (500 mg total) by mouth 2 (two) times daily with a meal. (Patient not taking: Reported on 11/22/2024), Disp: 60 tablet, Rfl: 0 [2] Allergies Allergen Reactions   Vyndamax  [Tafamidis ] Dermatitis and Rash

## 2024-11-28 ENCOUNTER — Ambulatory Visit: Admitting: Genetic Counselor

## 2025-02-12 ENCOUNTER — Ambulatory Visit (HOSPITAL_COMMUNITY): Admitting: Cardiology

## 2025-02-13 ENCOUNTER — Ambulatory Visit: Admitting: Pulmonary Disease
